# Patient Record
Sex: Female | Born: 1970 | Race: Black or African American | Hispanic: No | Smoking: Never smoker
Health system: Southern US, Community
[De-identification: ages and names within clinical notes are randomized; demographics above are authoritative.]

## PROBLEM LIST (undated history)

## (undated) ENCOUNTER — Inpatient Hospital Stay (HOSPITAL_COMMUNITY): Payer: Self-pay

## (undated) DIAGNOSIS — IMO0001 Reserved for inherently not codable concepts without codable children: Secondary | ICD-10-CM

## (undated) DIAGNOSIS — D219 Benign neoplasm of connective and other soft tissue, unspecified: Secondary | ICD-10-CM

## (undated) DIAGNOSIS — J189 Pneumonia, unspecified organism: Secondary | ICD-10-CM

## (undated) DIAGNOSIS — F32A Depression, unspecified: Secondary | ICD-10-CM

## (undated) DIAGNOSIS — K222 Esophageal obstruction: Secondary | ICD-10-CM

## (undated) DIAGNOSIS — K219 Gastro-esophageal reflux disease without esophagitis: Secondary | ICD-10-CM

## (undated) DIAGNOSIS — F329 Major depressive disorder, single episode, unspecified: Secondary | ICD-10-CM

## (undated) DIAGNOSIS — R030 Elevated blood-pressure reading, without diagnosis of hypertension: Secondary | ICD-10-CM

## (undated) DIAGNOSIS — I10 Essential (primary) hypertension: Principal | ICD-10-CM

## (undated) DIAGNOSIS — R002 Palpitations: Secondary | ICD-10-CM

## (undated) DIAGNOSIS — F419 Anxiety disorder, unspecified: Secondary | ICD-10-CM

## (undated) DIAGNOSIS — D573 Sickle-cell trait: Secondary | ICD-10-CM

## (undated) DIAGNOSIS — IMO0002 Reserved for concepts with insufficient information to code with codable children: Secondary | ICD-10-CM

## (undated) DIAGNOSIS — I499 Cardiac arrhythmia, unspecified: Secondary | ICD-10-CM

## (undated) DIAGNOSIS — R87619 Unspecified abnormal cytological findings in specimens from cervix uteri: Secondary | ICD-10-CM

## (undated) DIAGNOSIS — R87629 Unspecified abnormal cytological findings in specimens from vagina: Secondary | ICD-10-CM

## (undated) HISTORY — DX: Depression, unspecified: F32.A

## (undated) HISTORY — DX: Esophageal obstruction: K22.2

## (undated) HISTORY — DX: Elevated blood-pressure reading, without diagnosis of hypertension: R03.0

## (undated) HISTORY — DX: Anxiety disorder, unspecified: F41.9

## (undated) HISTORY — DX: Gastro-esophageal reflux disease without esophagitis: K21.9

## (undated) HISTORY — DX: Benign neoplasm of connective and other soft tissue, unspecified: D21.9

## (undated) HISTORY — DX: Reserved for inherently not codable concepts without codable children: IMO0001

## (undated) HISTORY — DX: Palpitations: R00.2

## (undated) HISTORY — DX: Essential (primary) hypertension: I10

## (undated) HISTORY — DX: Major depressive disorder, single episode, unspecified: F32.9

---

## 1998-07-29 ENCOUNTER — Inpatient Hospital Stay (HOSPITAL_COMMUNITY): Admission: AD | Admit: 1998-07-29 | Discharge: 1998-07-29 | Payer: Self-pay | Admitting: *Deleted

## 1998-07-29 ENCOUNTER — Emergency Department (HOSPITAL_COMMUNITY): Admission: EM | Admit: 1998-07-29 | Discharge: 1998-07-29 | Payer: Self-pay | Admitting: Emergency Medicine

## 1999-01-04 ENCOUNTER — Encounter: Payer: Self-pay | Admitting: Emergency Medicine

## 1999-01-04 ENCOUNTER — Emergency Department (HOSPITAL_COMMUNITY): Admission: EM | Admit: 1999-01-04 | Discharge: 1999-01-04 | Payer: Self-pay | Admitting: Emergency Medicine

## 1999-02-22 ENCOUNTER — Emergency Department (HOSPITAL_COMMUNITY): Admission: EM | Admit: 1999-02-22 | Discharge: 1999-02-22 | Payer: Self-pay | Admitting: Emergency Medicine

## 1999-04-24 ENCOUNTER — Emergency Department (HOSPITAL_COMMUNITY): Admission: EM | Admit: 1999-04-24 | Discharge: 1999-04-24 | Payer: Self-pay

## 1999-08-19 ENCOUNTER — Emergency Department (HOSPITAL_COMMUNITY): Admission: EM | Admit: 1999-08-19 | Discharge: 1999-08-19 | Payer: Self-pay | Admitting: Emergency Medicine

## 1999-11-29 HISTORY — PX: LAPAROSCOPY FOR ECTOPIC PREGNANCY: SUR765

## 2000-01-19 ENCOUNTER — Inpatient Hospital Stay (HOSPITAL_COMMUNITY): Admission: AD | Admit: 2000-01-19 | Discharge: 2000-01-19 | Payer: Self-pay | Admitting: Obstetrics

## 2000-01-21 ENCOUNTER — Inpatient Hospital Stay (HOSPITAL_COMMUNITY): Admission: AD | Admit: 2000-01-21 | Discharge: 2000-01-21 | Payer: Self-pay | Admitting: Obstetrics

## 2000-01-24 ENCOUNTER — Observation Stay (HOSPITAL_COMMUNITY): Admission: AD | Admit: 2000-01-24 | Discharge: 2000-01-25 | Payer: Self-pay | Admitting: *Deleted

## 2000-01-24 ENCOUNTER — Encounter (INDEPENDENT_AMBULATORY_CARE_PROVIDER_SITE_OTHER): Payer: Self-pay | Admitting: Specialist

## 2000-01-24 ENCOUNTER — Encounter: Payer: Self-pay | Admitting: *Deleted

## 2000-01-28 ENCOUNTER — Inpatient Hospital Stay (HOSPITAL_COMMUNITY): Admission: AD | Admit: 2000-01-28 | Discharge: 2000-01-28 | Payer: Self-pay | Admitting: Obstetrics & Gynecology

## 2000-01-28 ENCOUNTER — Encounter: Payer: Self-pay | Admitting: Obstetrics & Gynecology

## 2000-06-15 ENCOUNTER — Encounter (INDEPENDENT_AMBULATORY_CARE_PROVIDER_SITE_OTHER): Payer: Self-pay

## 2000-06-15 ENCOUNTER — Inpatient Hospital Stay (HOSPITAL_COMMUNITY): Admission: AD | Admit: 2000-06-15 | Discharge: 2000-06-18 | Payer: Self-pay | Admitting: *Deleted

## 2000-06-16 ENCOUNTER — Encounter: Payer: Self-pay | Admitting: *Deleted

## 2000-06-20 ENCOUNTER — Inpatient Hospital Stay (HOSPITAL_COMMUNITY): Admission: AD | Admit: 2000-06-20 | Discharge: 2000-06-20 | Payer: Self-pay | Admitting: Obstetrics and Gynecology

## 2000-06-22 ENCOUNTER — Inpatient Hospital Stay (HOSPITAL_COMMUNITY): Admission: AD | Admit: 2000-06-22 | Discharge: 2000-06-22 | Payer: Self-pay | Admitting: *Deleted

## 2000-09-14 ENCOUNTER — Emergency Department (HOSPITAL_COMMUNITY): Admission: EM | Admit: 2000-09-14 | Discharge: 2000-09-14 | Payer: Self-pay | Admitting: Emergency Medicine

## 2000-11-22 ENCOUNTER — Emergency Department (HOSPITAL_COMMUNITY): Admission: EM | Admit: 2000-11-22 | Discharge: 2000-11-22 | Payer: Self-pay | Admitting: Emergency Medicine

## 2000-12-20 ENCOUNTER — Inpatient Hospital Stay (HOSPITAL_COMMUNITY): Admission: AD | Admit: 2000-12-20 | Discharge: 2000-12-20 | Payer: Self-pay | Admitting: Obstetrics

## 2001-01-15 ENCOUNTER — Emergency Department (HOSPITAL_COMMUNITY): Admission: EM | Admit: 2001-01-15 | Discharge: 2001-01-15 | Payer: Self-pay | Admitting: Emergency Medicine

## 2001-01-21 ENCOUNTER — Emergency Department (HOSPITAL_COMMUNITY): Admission: EM | Admit: 2001-01-21 | Discharge: 2001-01-21 | Payer: Self-pay | Admitting: *Deleted

## 2001-01-21 ENCOUNTER — Encounter: Payer: Self-pay | Admitting: *Deleted

## 2001-01-29 ENCOUNTER — Inpatient Hospital Stay (HOSPITAL_COMMUNITY): Admission: AD | Admit: 2001-01-29 | Discharge: 2001-01-29 | Payer: Self-pay | Admitting: Obstetrics & Gynecology

## 2001-07-02 ENCOUNTER — Emergency Department (HOSPITAL_COMMUNITY): Admission: EM | Admit: 2001-07-02 | Discharge: 2001-07-02 | Payer: Self-pay | Admitting: Internal Medicine

## 2001-07-05 ENCOUNTER — Emergency Department (HOSPITAL_COMMUNITY): Admission: EM | Admit: 2001-07-05 | Discharge: 2001-07-05 | Payer: Self-pay | Admitting: Emergency Medicine

## 2001-07-09 ENCOUNTER — Other Ambulatory Visit (HOSPITAL_COMMUNITY): Admission: RE | Admit: 2001-07-09 | Discharge: 2001-07-17 | Payer: Self-pay | Admitting: Psychiatry

## 2001-08-22 ENCOUNTER — Emergency Department (HOSPITAL_COMMUNITY): Admission: EM | Admit: 2001-08-22 | Discharge: 2001-08-22 | Payer: Self-pay | Admitting: Emergency Medicine

## 2001-09-05 ENCOUNTER — Inpatient Hospital Stay (HOSPITAL_COMMUNITY): Admission: AD | Admit: 2001-09-05 | Discharge: 2001-09-08 | Payer: Self-pay | Admitting: *Deleted

## 2001-09-10 ENCOUNTER — Other Ambulatory Visit (HOSPITAL_COMMUNITY): Admission: RE | Admit: 2001-09-10 | Discharge: 2001-09-24 | Payer: Self-pay | Admitting: Psychiatry

## 2002-02-25 ENCOUNTER — Inpatient Hospital Stay (HOSPITAL_COMMUNITY): Admission: AD | Admit: 2002-02-25 | Discharge: 2002-02-25 | Payer: Self-pay | Admitting: *Deleted

## 2002-03-01 ENCOUNTER — Emergency Department (HOSPITAL_COMMUNITY): Admission: EM | Admit: 2002-03-01 | Discharge: 2002-03-01 | Payer: Self-pay | Admitting: Emergency Medicine

## 2002-05-26 ENCOUNTER — Emergency Department (HOSPITAL_COMMUNITY): Admission: EM | Admit: 2002-05-26 | Discharge: 2002-05-26 | Payer: Self-pay | Admitting: Emergency Medicine

## 2002-11-23 ENCOUNTER — Inpatient Hospital Stay (HOSPITAL_COMMUNITY): Admission: AD | Admit: 2002-11-23 | Discharge: 2002-11-23 | Payer: Self-pay | Admitting: Obstetrics and Gynecology

## 2003-01-24 ENCOUNTER — Inpatient Hospital Stay (HOSPITAL_COMMUNITY): Admission: AD | Admit: 2003-01-24 | Discharge: 2003-01-24 | Payer: Self-pay | Admitting: *Deleted

## 2003-01-24 ENCOUNTER — Encounter: Payer: Self-pay | Admitting: *Deleted

## 2003-01-28 ENCOUNTER — Other Ambulatory Visit: Admission: RE | Admit: 2003-01-28 | Discharge: 2003-01-28 | Payer: Self-pay | Admitting: *Deleted

## 2003-02-11 ENCOUNTER — Observation Stay (HOSPITAL_COMMUNITY): Admission: AD | Admit: 2003-02-11 | Discharge: 2003-02-11 | Payer: Self-pay | Admitting: *Deleted

## 2003-02-11 ENCOUNTER — Encounter: Payer: Self-pay | Admitting: *Deleted

## 2003-04-11 ENCOUNTER — Inpatient Hospital Stay (HOSPITAL_COMMUNITY): Admission: AD | Admit: 2003-04-11 | Discharge: 2003-04-11 | Payer: Self-pay | Admitting: *Deleted

## 2003-04-11 ENCOUNTER — Encounter: Payer: Self-pay | Admitting: *Deleted

## 2003-06-15 ENCOUNTER — Inpatient Hospital Stay (HOSPITAL_COMMUNITY): Admission: AD | Admit: 2003-06-15 | Discharge: 2003-06-15 | Payer: Self-pay | Admitting: *Deleted

## 2003-07-25 ENCOUNTER — Inpatient Hospital Stay (HOSPITAL_COMMUNITY): Admission: AD | Admit: 2003-07-25 | Discharge: 2003-07-25 | Payer: Self-pay | Admitting: *Deleted

## 2003-09-03 ENCOUNTER — Inpatient Hospital Stay (HOSPITAL_COMMUNITY): Admission: AD | Admit: 2003-09-03 | Discharge: 2003-09-03 | Payer: Self-pay | Admitting: Obstetrics and Gynecology

## 2003-09-03 ENCOUNTER — Encounter: Payer: Self-pay | Admitting: Obstetrics and Gynecology

## 2003-09-07 ENCOUNTER — Inpatient Hospital Stay (HOSPITAL_COMMUNITY): Admission: AD | Admit: 2003-09-07 | Discharge: 2003-09-07 | Payer: Self-pay | Admitting: Obstetrics and Gynecology

## 2003-09-08 ENCOUNTER — Inpatient Hospital Stay (HOSPITAL_COMMUNITY): Admission: AD | Admit: 2003-09-08 | Discharge: 2003-09-08 | Payer: Self-pay | Admitting: Obstetrics and Gynecology

## 2003-09-08 ENCOUNTER — Encounter: Payer: Self-pay | Admitting: Obstetrics and Gynecology

## 2003-09-15 ENCOUNTER — Encounter (INDEPENDENT_AMBULATORY_CARE_PROVIDER_SITE_OTHER): Payer: Self-pay | Admitting: *Deleted

## 2003-09-15 ENCOUNTER — Inpatient Hospital Stay (HOSPITAL_COMMUNITY): Admission: AD | Admit: 2003-09-15 | Discharge: 2003-09-18 | Payer: Self-pay | Admitting: Obstetrics and Gynecology

## 2003-09-20 ENCOUNTER — Inpatient Hospital Stay (HOSPITAL_COMMUNITY): Admission: AD | Admit: 2003-09-20 | Discharge: 2003-09-20 | Payer: Self-pay | Admitting: Obstetrics and Gynecology

## 2003-10-16 ENCOUNTER — Other Ambulatory Visit: Admission: RE | Admit: 2003-10-16 | Discharge: 2003-10-16 | Payer: Self-pay | Admitting: Obstetrics and Gynecology

## 2004-05-13 ENCOUNTER — Inpatient Hospital Stay (HOSPITAL_COMMUNITY): Admission: AD | Admit: 2004-05-13 | Discharge: 2004-05-13 | Payer: Self-pay | Admitting: *Deleted

## 2004-06-14 ENCOUNTER — Inpatient Hospital Stay (HOSPITAL_COMMUNITY): Admission: AD | Admit: 2004-06-14 | Discharge: 2004-06-14 | Payer: Self-pay | Admitting: *Deleted

## 2004-10-16 ENCOUNTER — Ambulatory Visit (HOSPITAL_COMMUNITY): Admission: RE | Admit: 2004-10-16 | Discharge: 2004-10-16 | Payer: Self-pay | Admitting: Family Medicine

## 2006-10-16 ENCOUNTER — Emergency Department (HOSPITAL_COMMUNITY): Admission: EM | Admit: 2006-10-16 | Discharge: 2006-10-16 | Payer: Self-pay | Admitting: Emergency Medicine

## 2008-03-16 ENCOUNTER — Emergency Department (HOSPITAL_COMMUNITY): Admission: EM | Admit: 2008-03-16 | Discharge: 2008-03-16 | Payer: Self-pay | Admitting: Emergency Medicine

## 2008-06-17 ENCOUNTER — Emergency Department (HOSPITAL_COMMUNITY): Admission: EM | Admit: 2008-06-17 | Discharge: 2008-06-18 | Payer: Self-pay | Admitting: Emergency Medicine

## 2008-11-16 ENCOUNTER — Emergency Department (HOSPITAL_BASED_OUTPATIENT_CLINIC_OR_DEPARTMENT_OTHER): Admission: EM | Admit: 2008-11-16 | Discharge: 2008-11-16 | Payer: Self-pay | Admitting: Emergency Medicine

## 2008-11-27 ENCOUNTER — Emergency Department (HOSPITAL_BASED_OUTPATIENT_CLINIC_OR_DEPARTMENT_OTHER): Admission: EM | Admit: 2008-11-27 | Discharge: 2008-11-27 | Payer: Self-pay | Admitting: Emergency Medicine

## 2009-02-19 ENCOUNTER — Emergency Department (HOSPITAL_BASED_OUTPATIENT_CLINIC_OR_DEPARTMENT_OTHER): Admission: EM | Admit: 2009-02-19 | Discharge: 2009-02-20 | Payer: Self-pay | Admitting: Emergency Medicine

## 2009-05-18 ENCOUNTER — Emergency Department (HOSPITAL_BASED_OUTPATIENT_CLINIC_OR_DEPARTMENT_OTHER): Admission: EM | Admit: 2009-05-18 | Discharge: 2009-05-19 | Payer: Self-pay | Admitting: Emergency Medicine

## 2009-05-19 ENCOUNTER — Ambulatory Visit: Payer: Self-pay | Admitting: Diagnostic Radiology

## 2009-08-06 ENCOUNTER — Ambulatory Visit: Payer: Self-pay

## 2009-08-18 ENCOUNTER — Emergency Department (HOSPITAL_COMMUNITY): Admission: EM | Admit: 2009-08-18 | Discharge: 2009-08-18 | Payer: Self-pay | Admitting: Emergency Medicine

## 2009-08-28 ENCOUNTER — Encounter: Payer: Self-pay | Admitting: Emergency Medicine

## 2009-08-28 ENCOUNTER — Inpatient Hospital Stay (HOSPITAL_COMMUNITY): Admission: AD | Admit: 2009-08-28 | Discharge: 2009-08-29 | Payer: Self-pay | Admitting: Internal Medicine

## 2009-08-28 ENCOUNTER — Ambulatory Visit: Payer: Self-pay | Admitting: Diagnostic Radiology

## 2009-09-02 ENCOUNTER — Emergency Department (HOSPITAL_BASED_OUTPATIENT_CLINIC_OR_DEPARTMENT_OTHER): Admission: EM | Admit: 2009-09-02 | Discharge: 2009-09-02 | Payer: Self-pay | Admitting: Emergency Medicine

## 2009-09-04 ENCOUNTER — Observation Stay (HOSPITAL_COMMUNITY): Admission: EM | Admit: 2009-09-04 | Discharge: 2009-09-05 | Payer: Self-pay | Admitting: Emergency Medicine

## 2009-09-14 ENCOUNTER — Emergency Department (HOSPITAL_COMMUNITY): Admission: EM | Admit: 2009-09-14 | Discharge: 2009-09-14 | Payer: Self-pay | Admitting: Emergency Medicine

## 2009-09-15 ENCOUNTER — Encounter (INDEPENDENT_AMBULATORY_CARE_PROVIDER_SITE_OTHER): Payer: Self-pay | Admitting: *Deleted

## 2009-09-18 ENCOUNTER — Emergency Department (HOSPITAL_BASED_OUTPATIENT_CLINIC_OR_DEPARTMENT_OTHER): Admission: EM | Admit: 2009-09-18 | Discharge: 2009-09-18 | Payer: Self-pay | Admitting: Emergency Medicine

## 2009-09-18 ENCOUNTER — Ambulatory Visit: Payer: Self-pay | Admitting: Diagnostic Radiology

## 2009-09-19 ENCOUNTER — Emergency Department (HOSPITAL_COMMUNITY): Admission: EM | Admit: 2009-09-19 | Discharge: 2009-09-19 | Payer: Self-pay | Admitting: Family Medicine

## 2009-09-24 ENCOUNTER — Emergency Department (HOSPITAL_COMMUNITY): Admission: EM | Admit: 2009-09-24 | Discharge: 2009-09-24 | Payer: Self-pay | Admitting: Emergency Medicine

## 2009-09-26 ENCOUNTER — Ambulatory Visit: Payer: Self-pay | Admitting: Diagnostic Radiology

## 2009-09-26 ENCOUNTER — Emergency Department (HOSPITAL_BASED_OUTPATIENT_CLINIC_OR_DEPARTMENT_OTHER): Admission: EM | Admit: 2009-09-26 | Discharge: 2009-09-26 | Payer: Self-pay | Admitting: Emergency Medicine

## 2009-09-28 ENCOUNTER — Ambulatory Visit: Payer: Self-pay | Admitting: Family

## 2009-09-28 ENCOUNTER — Telehealth: Payer: Self-pay | Admitting: Family

## 2009-09-28 DIAGNOSIS — F418 Other specified anxiety disorders: Secondary | ICD-10-CM | POA: Insufficient documentation

## 2009-09-28 DIAGNOSIS — I10 Essential (primary) hypertension: Secondary | ICD-10-CM

## 2009-09-28 DIAGNOSIS — K219 Gastro-esophageal reflux disease without esophagitis: Secondary | ICD-10-CM | POA: Insufficient documentation

## 2009-09-28 DIAGNOSIS — F411 Generalized anxiety disorder: Secondary | ICD-10-CM | POA: Insufficient documentation

## 2009-09-28 HISTORY — DX: Essential (primary) hypertension: I10

## 2009-09-28 LAB — CONVERTED CEMR LAB
Basophils Absolute: 0 10*3/uL (ref 0.0–0.1)
Chloride: 106 meq/L (ref 96–112)
HDL: 53 mg/dL (ref >39)
LDL Cholesterol: 113 mg/dL — ABNORMAL HIGH (ref 0–99)
MCHC: 34.8 g/dL (ref 30.0–36.0)
MCV: 82.5 fL (ref 78.0–100.0)
Neutrophils Relative %: 65 % (ref 43–77)
Platelets: 345 10*3/uL (ref 150–400)
Potassium: 3.9 meq/L (ref 3.5–5.3)
RDW: 12.6 % (ref 11.5–15.5)
TSH: 0.664 microintl units/mL (ref 0.350–4.500)
Total CHOL/HDL Ratio: 3.4
Triglycerides: 62 mg/dL (ref <150)

## 2009-09-29 ENCOUNTER — Encounter: Payer: Self-pay | Admitting: Family

## 2009-09-29 ENCOUNTER — Telehealth: Payer: Self-pay | Admitting: Family

## 2009-09-30 ENCOUNTER — Ambulatory Visit: Payer: Self-pay | Admitting: Family

## 2009-09-30 DIAGNOSIS — Z9189 Other specified personal risk factors, not elsewhere classified: Secondary | ICD-10-CM | POA: Insufficient documentation

## 2009-09-30 DIAGNOSIS — E559 Vitamin D deficiency, unspecified: Secondary | ICD-10-CM | POA: Insufficient documentation

## 2009-09-30 DIAGNOSIS — R002 Palpitations: Secondary | ICD-10-CM | POA: Insufficient documentation

## 2009-10-02 ENCOUNTER — Telehealth: Payer: Self-pay | Admitting: Family

## 2009-10-02 ENCOUNTER — Ambulatory Visit: Payer: Self-pay | Admitting: *Deleted

## 2009-10-04 ENCOUNTER — Ambulatory Visit (HOSPITAL_COMMUNITY): Admission: RE | Admit: 2009-10-04 | Discharge: 2009-10-04 | Payer: Self-pay | Admitting: Psychiatry

## 2009-10-04 ENCOUNTER — Emergency Department (HOSPITAL_COMMUNITY): Admission: EM | Admit: 2009-10-04 | Discharge: 2009-10-04 | Payer: Self-pay | Admitting: Emergency Medicine

## 2009-10-09 ENCOUNTER — Ambulatory Visit: Payer: Self-pay | Admitting: Diagnostic Radiology

## 2009-10-09 ENCOUNTER — Emergency Department (HOSPITAL_BASED_OUTPATIENT_CLINIC_OR_DEPARTMENT_OTHER): Admission: EM | Admit: 2009-10-09 | Discharge: 2009-10-09 | Payer: Self-pay | Admitting: Emergency Medicine

## 2009-10-10 ENCOUNTER — Emergency Department (HOSPITAL_BASED_OUTPATIENT_CLINIC_OR_DEPARTMENT_OTHER): Admission: EM | Admit: 2009-10-10 | Discharge: 2009-10-10 | Payer: Self-pay | Admitting: Emergency Medicine

## 2009-10-14 ENCOUNTER — Other Ambulatory Visit (HOSPITAL_COMMUNITY): Admission: RE | Admit: 2009-10-14 | Discharge: 2009-10-21 | Payer: Self-pay | Admitting: Psychiatry

## 2009-10-15 ENCOUNTER — Ambulatory Visit: Payer: Self-pay | Admitting: Psychiatry

## 2009-10-18 ENCOUNTER — Encounter: Payer: Self-pay | Admitting: Cardiology

## 2009-11-23 ENCOUNTER — Telehealth: Payer: Self-pay | Admitting: Internal Medicine

## 2010-01-07 ENCOUNTER — Emergency Department (HOSPITAL_BASED_OUTPATIENT_CLINIC_OR_DEPARTMENT_OTHER): Admission: EM | Admit: 2010-01-07 | Discharge: 2010-01-08 | Payer: Self-pay | Admitting: Emergency Medicine

## 2010-01-21 ENCOUNTER — Ambulatory Visit: Payer: Self-pay | Admitting: Cardiology

## 2010-02-02 ENCOUNTER — Emergency Department (HOSPITAL_BASED_OUTPATIENT_CLINIC_OR_DEPARTMENT_OTHER): Admission: EM | Admit: 2010-02-02 | Discharge: 2010-02-02 | Payer: Self-pay | Admitting: Emergency Medicine

## 2010-02-02 ENCOUNTER — Ambulatory Visit: Payer: Self-pay | Admitting: Diagnostic Radiology

## 2010-02-15 ENCOUNTER — Encounter: Payer: Self-pay | Admitting: Cardiology

## 2010-02-17 ENCOUNTER — Encounter (INDEPENDENT_AMBULATORY_CARE_PROVIDER_SITE_OTHER): Payer: Self-pay | Admitting: *Deleted

## 2010-03-25 ENCOUNTER — Emergency Department (HOSPITAL_BASED_OUTPATIENT_CLINIC_OR_DEPARTMENT_OTHER): Admission: EM | Admit: 2010-03-25 | Discharge: 2010-03-25 | Payer: Self-pay | Admitting: Emergency Medicine

## 2010-05-16 ENCOUNTER — Emergency Department (HOSPITAL_BASED_OUTPATIENT_CLINIC_OR_DEPARTMENT_OTHER): Admission: EM | Admit: 2010-05-16 | Discharge: 2010-05-16 | Payer: Self-pay | Admitting: Emergency Medicine

## 2010-05-20 ENCOUNTER — Telehealth: Payer: Self-pay | Admitting: Family

## 2010-05-24 ENCOUNTER — Ambulatory Visit: Payer: Self-pay | Admitting: Family

## 2010-05-24 DIAGNOSIS — R5383 Other fatigue: Secondary | ICD-10-CM | POA: Insufficient documentation

## 2010-05-24 LAB — CONVERTED CEMR LAB
CO2: 25 meq/L (ref 19–32)
Glucose, Bld: 92 mg/dL (ref 70–99)
HCT: 34.2 % — ABNORMAL LOW (ref 36.0–46.0)
Hemoglobin: 12.2 g/dL (ref 12.0–15.0)
MCV: 79.9 fL (ref 78.0–100.0)
Platelets: 342 10*3/uL (ref 150–400)
RDW: 12.7 % (ref 11.5–15.5)
Sodium: 140 meq/L (ref 135–145)
TSH: 0.773 microintl units/mL (ref 0.350–4.500)
Vit D, 1,25-Dihydroxy: 28 — ABNORMAL LOW (ref 30–89)

## 2010-05-25 ENCOUNTER — Encounter: Payer: Self-pay | Admitting: Family

## 2010-05-28 ENCOUNTER — Telehealth: Payer: Self-pay | Admitting: Family

## 2010-06-15 ENCOUNTER — Telehealth: Payer: Self-pay | Admitting: Family

## 2010-06-22 ENCOUNTER — Telehealth: Payer: Self-pay | Admitting: Family

## 2010-06-29 ENCOUNTER — Ambulatory Visit: Payer: Self-pay | Admitting: Family

## 2010-07-30 ENCOUNTER — Ambulatory Visit: Payer: Self-pay | Admitting: Family

## 2010-08-15 ENCOUNTER — Emergency Department (HOSPITAL_BASED_OUTPATIENT_CLINIC_OR_DEPARTMENT_OTHER): Admission: EM | Admit: 2010-08-15 | Discharge: 2010-08-15 | Payer: Self-pay | Admitting: Emergency Medicine

## 2010-09-03 ENCOUNTER — Emergency Department (HOSPITAL_BASED_OUTPATIENT_CLINIC_OR_DEPARTMENT_OTHER): Admission: EM | Admit: 2010-09-03 | Discharge: 2010-09-03 | Payer: Self-pay | Admitting: Emergency Medicine

## 2010-10-19 ENCOUNTER — Emergency Department (HOSPITAL_BASED_OUTPATIENT_CLINIC_OR_DEPARTMENT_OTHER): Admission: EM | Admit: 2010-10-19 | Discharge: 2010-10-19 | Payer: Self-pay | Admitting: Emergency Medicine

## 2010-11-28 HISTORY — PX: WISDOM TOOTH EXTRACTION: SHX21

## 2010-12-30 NOTE — Progress Notes (Signed)
Summary: status update  Phone Note Other Incoming Call back at [phone]   Caller: Revonda Humphrey (call-a-nurse) Summary of Call: Patient calling about overdose of Rx. Took evening dose of Lexapro 20mg  before leaving house about 4:30pm. Was at restaurant and thought she needed to take Rx so took second dose of 20 mg about 5:15pm. Now feeling slightly lightheaded for the last 45 min starting about 5:40pm. Slight nausea, worry. Gave care information. Transferred to Poison control/[place].  Follow-up for Phone Call        Attempted to contact pt. to check on status and see if she had questions in regards to her medication directions. Left message on voicemail to return my call.  Nicki Guadalajara Fergerson CMA Duncan Dull)  May 28, 2010 8:17 AM   Additional Follow-up for Phone Call Additional follow up Details #1::        Pt returned my call. Stated she felt better today. Poison control center told her she should not feel much effect as the medication has been in her system for 3 months already. Pt states she slept it off and feels fine. She denies questions re: medication directions and states it was just an accident.  Nicki Guadalajara Fergerson CMA [place] Dull)  May 28, 2010 11:34 AM

## 2010-12-30 NOTE — Progress Notes (Signed)
Summary: lexapro alternative  Phone Note Call from Patient Call back at [phone]   Caller: Patient Call For: Lemont Fillers FNP Summary of Call: Pt called stating that she went to pharmacy to get Lexapro rx and she could not afford them. States she has no rx benefits through her insurance.  Advised pt I would check with drug company for pt assistance and call her back tomorrow.  Pt states it is ok to leave detailed message on her home phone.  Nicki Guadalajara Fergerson CMA Duncan Dull)  June 22, 2010 4:57 PM   Follow-up for Phone Call        Left message on machine to return my call at 9:30am today. Nicki Guadalajara Fergerson CMA [facility] Dull)  Additional Follow-up for Phone Call Additional follow up Details #1::        Pt returned my call and left message to call her at [phone] until 4pm today. Called number and received fax tone. Left message on pt's home number to call me tomorrow.  Nicki Guadalajara Fergerson CMA Duncan Dull)  June 23, 2010 3:01 PM       Additional Follow-up for Phone Call Additional follow up Details #2::    Asmar Brozek, I have put paperwork for pt assistance to be completed in your folder. Pt has not returned my call so I am not sure if she is currently without med. We are out of samples. I left a detailed message on her voicemail letting her know that we are working on the form and did not have samples.   Nicki Guadalajara Fergerson CMA Duncan Dull)  June 28, 2010 9:26 AM   Additional Follow-up for Phone Call Additional follow up Details #3:: Details for Additional Follow-up Action Taken: I would recommend that the patient try a similar generic medication that would be cheaper for her first.  She should stop lexapro and start Citalopram.  She should call if she develops worsening depression.  Follow up in 1 month. Lemont Fillers FNP,  June 28, 2010 10:17 AM  Left message on machine to return my call.  Nicki Guadalajara Fergerson CMA Duncan Dull)  June 28, 2010 11:46 AM  Left message on machine to return my call.  Nicki Guadalajara Fergerson  CMA [place] Dull)  June 29, 2010 4:32 PM   Advised pt per [facility] instructions above. Pt voices understanding. Follow up scheduled for 07/30/10 @ 9:30 with Efraim Kaufmann.  Nicki Guadalajara Fergerson CMA (AAMA)  July 02, 2010 11:18 AM   New/Updated Medications: CITALOPRAM HYDROBROMIDE 20 MG TABS (CITALOPRAM HYDROBROMIDE) one tablet by mouth daily Prescriptions: CITALOPRAM HYDROBROMIDE 20 MG TABS (CITALOPRAM HYDROBROMIDE) one tablet by mouth daily  #30 x 1   Entered and Authorized by:   Lemont Fillers FNP   Signed by:   Lemont Fillers FNP on 06/28/2010   Method used:   Electronically to        [facility] [phone][facility] (retail)       [address]       [place], [place]  [address]       Ph: [phone]       Fax: [phone]   RxID:   [id number]

## 2010-12-30 NOTE — Assessment & Plan Note (Signed)
Summary: EC6/PALPS/RESCH/OK PER HEATHER   Visit Type:  Initial Consult Primary Provider:  Sandford Craze, FNP  CC:  chest pain.  History of Present Illness: The patient is seen for consultation evaluate chest pain and palpitations. For several months she has been very worried about chest discomfort.  Today she tells me that frequently she feels brief palpitations.  Following that she has chest discomfort.  This can occur at rest or with stress.  This has substantially affected her life and she has cut out all types of exercise.  She has 2 young children and she is worried about the situation.  The chest discomfort is nonspecific.  She does not have nausea vomiting or diaphoresis.  There is no radiation.  She has not had or presyncope.  Current Medications (verified): 1)  Dexilant 60 Mg Cpdr (Dexlansoprazole) .... One Tablet By Mouth Daily As Needed 2)  Lexapro 20 Mg Tabs (Escitalopram Oxalate) .... Once Daily 3)  Klonopin 0.5 Mg Tabs (Clonazepam) .... As Needed  Allergies (verified): 1)  ! Benadryl (Diphenhydramine Hcl) 2)  ! Morphine 3)  ! Dilaudid (Hydromorphone Hcl)  Past History:  Past Medical History: Esophageal stricture Depression GERD Anxiety Disorder Uterine fibroids Chest Pain....Marland Kitchenno pulmonary embolus by ches CT..09/14/2009 Palpitations Elevated BP C-Section  Family History: Reviewed history from 09/30/2009 and no changes required. Mom alive age 42 A&W Dad alive age 8 A&W  Social History: Reviewed history from 09/30/2009 and no changes required. 2 children ages 37 and 85, self- employed Education officer, museum. Never smoked, no drugs, Denies alcohol  Review of Systems       Patient denies fever, chills, headache, sweats, rash, change in vision, change in hearing, cough, nausea or vomiting, urinary symptoms.  All other systems are reviewed and are negative.  Vital Signs:  Patient profile:   40 year old female Height:      65 inches Weight:      204  pounds BMI:     34.07 Pulse rate:   70 / minute BP sitting:   104 / 70  (left arm) Cuff size:   regular  Vitals Entered By: Hardin Negus, RMA (January 21, 2010 10:59 AM)  Physical Exam  General:  The patient is quite stable.  She is very concerned about. Head:  head is atraumatic. Eyes:  no xanthelasma. Neck:  no jugular venous distention. Chest Wall:  no chest wall tenderness. Lungs:  lungs are clear.  Respiratory effort is nonlabored. Heart:  cardiac exam reveals S1-S2.  There are no clicks or significant murmurs. Abdomen:  abdomen is soft. Msk:  there are no musculoskeletal deformities. Extremities:  there is no peripheral edema. Skin:  there is no skin rashes. Psych:  patient is oriented to person time and place.  Affect is normal.   Impression & Recommendations:  Problem # 1:  CHEST PAIN, ATYPICAL, HX OF (ICD-V15.89)  At this point there is no proven evidence of ischemic heart disease.  She does not have a strong family history and she does not smoke.  Her LDL is 112 HDL 53.  We do need to assess her further with exercise testing.  Two-dimensional echo will also be done to fully assess all of her valves.  Along with this she will have a stress echo.  I have reviewed the EKG done previously by her primary care team.  She has incomplete right bundle branch block.  Orders: Stress Echo (Stress Echo) Echocardiogram (Echo) Event (Event)  Problem # 2:  PALPITATIONS, RECURRENT (ICD-785.1)  Her palpitations sound more like PACs and PVCs as opposed to prolong runs of an arrhythmia.  However it is the palpitations that she frequently feels first.  We will have her wear an event recorder to fully assess her rhythm.  Orders: Stress Echo (Stress Echo) Echocardiogram (Echo) Event (Event)  Problem # 3:  ELEVATED BLOOD PRESSURE WITHOUT DIAGNOSIS OF HYPERTENSION (ICD-796.2) blood pressure is well controlled today.  No change in therapy.  Patient Instructions: 1)  Your  physician has requested that you have a stress echocardiogram. For further information please visit [email or web address]  Please follow instruction sheet as given. 2)  Your physician has requested that you have an echocardiogram.  Echocardiography is a painless test that uses sound waves to create images of your heart. It provides your doctor with information about the size and shape of your heart and how well your heart's chambers and valves are working.  This procedure takes approximately one hour. There are no restrictions for this procedure. 3)  Your physician has recommended that you wear an event monitor.  Event monitors are medical devices that record the heart's electrical activity. Doctors most often use these monitors to diagnose arrhythmias. Arrhythmias are problems with the speed or rhythm of the heartbeat. The monitor is a small, portable device. You can wear one while you do your normal daily activities. This is usually used to diagnose what is causing palpitations/syncope (passing out). 4)  Follow up in 2 weeks

## 2010-12-30 NOTE — Progress Notes (Signed)
Summary: needs some samples till appt   Phone Note Call from Patient Call back at Home Phone [phone]   Caller: Patient Call For: Stacy [name]  Summary of Call: she has an appt on Monday to see Dorlene Footman.  She needs some samples of Lexapro 10mg  before the visit if possible.   Initial call taken by: Roselle Locus,  May 20, 2010 10:16 AM  Follow-up for Phone Call        Spoke to pt. and confirmed that pt is taking Lexapro 10mg  2 tablets daily.  Advised pt that I would leave 2 boxes at the front desk for her to pick up.  Mervin Kung CMA  May 20, 2010 4:39 PM

## 2010-12-30 NOTE — Assessment & Plan Note (Signed)
Summary: 1 month follow up/mhf--Rm 4   Vital Signs:  Patient profile:   40 year old female Height:      65 inches Temp:     98.0 degrees F oral Pulse rate:   102 / minute Pulse rhythm:   regular Resp:     16 per minute BP sitting:   120 / 72  (right arm) Cuff size:   large  Vitals Entered By: Stacy [name] CMA (May 24, 2010 3:41 PM) CC: Room 4  1 month follow up.  No energy, can hardly get through the day x 1 month. Is Patient Diabetic? No Comments Pt agrees all med doses and directions are correct.  Stacy [name] CMA  May 24, 2010 3:45 PM    Primary Care Provider:  Sandford Craze, FNP  CC:  Room 4  1 month follow up.  No energy and can hardly get through the day x 1 month..  History of Present Illness: Stacy [name] is a 40 yr old female who presents today for follow up.   1) Depression/anxiety- much improved [facility] and was placed on Lexapro.  Sees a therapist twice a month.  She finds this very helpful. Notes + weight gain since starting lexapro, but she declined weight today.   2)  Palpitations- resolved. She reports that she did not complete with work up as recommended by Dr. Myrtis Ser.  Reports that her chest pain has resolved- only discomfort that she has is due to reflux.  3) GERD- improved with dexilant.  (Follows with Dr. Elnoria Howard)  4) Fatigue- notes poor energy. This is new per patient. Sleeps 7-8 hours a night. Denies heavy periods.  Allergies: 1)  ! Benadryl (Diphenhydramine Hcl) 2)  ! Morphine 3)  ! Dilaudid (Hydromorphone Hcl)  Past History:  Past Medical History: Last updated: 01/21/2010 Esophageal stricture Depression GERD Anxiety Disorder Uterine fibroids Chest Pain....Marland Kitchenno pulmonary embolus by ches CT..09/14/2009 Palpitations Elevated BP C-Section  Past Surgical History: Last updated: 09/28/2009 C-Section  Family History: Last updated: 09/30/2009 Mom alive age 79 A&W Dad alive age 68 A&W  Social History: Last updated:  09/30/2009 2 children ages 6 and 3, self- employed property Banker. Never smoked, no drugs, Denies alcohol  Risk Factors: Smoking Status: never (09/28/2009)  Physical Exam  General:  Overweight AA female in NAD Head:  Normocephalic and atraumatic without obvious abnormalities. No apparent alopecia or balding. Lungs:  Normal respiratory effort, chest expands symmetrically. Lungs are clear to auscultation, no crackles or wheezes. Heart:  Normal rate and regular rhythm. S1 and S2 normal without gallop, murmur, click, rub or other extra sounds. Psych:  Cognition and judgment appear intact. Alert and cooperative with normal attention span and concentration. No apparent delusions, illusions, hallucinations   Impression & Recommendations:  Problem # 1:  FATIGUE (ICD-780.79) Assessment New Will check labs as noted below.    Orders: T-CBC No Diff ([id number]) T-Basic Metabolic Panel ([id number]) T-TSH [phone])  Problem # 2:  VITAMIN D DEFICIENCY (ICD-268.9) Assessment: Comment Only She completed the 12 months of weekly supplementation- check vitamin d level Orders: T-Assay of Vitamin D ([id number])  Complete Medication List: 1)  Dexilant 60 Mg Cpdr (Dexlansoprazole) .... One tablet by mouth daily as needed 2)  Lexapro 20 Mg Tabs (Escitalopram oxalate) .... Once daily 3)  Klonopin 0.5 Mg Tabs (Clonazepam) .... As needed  Other Orders: T-Hgb A1C ([id number])  Patient Instructions: 1)  Please complete your blood work today prior to leaving. 2)  Follow up in 1 month.  Current Allergies (reviewed today): ! BENADRYL (DIPHENHYDRAMINE HCL) ! MORPHINE ! DILAUDID (HYDROMORPHONE HCL)

## 2010-12-30 NOTE — Letter (Signed)
Summary: Appointment - Missed  [facility], Main Office  [address]   [place], [place] [address]   Phone: [phone]  Fax: [phone]     February 17, 2010 MRN: [id number]   [facility] [address] [place], [place]  [address]   Dear [name],  Our records indicate you missed your appointment on 02/15/2010 with Dr. Myrtis Ser. It is very important that we reach you to reschedule this appointment. We look forward to participating in your health care needs. Please contact us at the number listed above at your earliest convenience to reschedule this appointment.     Sincerely,   Migdalia Dk [facility] Scheduling Team

## 2010-12-30 NOTE — Letter (Signed)
Summary: Generic Letter  [facility], Main Office  [address]   [place], [place] [address]   Phone: [phone]  Fax: [phone]        February 15, 2010 MRN: [id number]    [facility] [address] [place], [place]  [address]    Dear [name],  Our records indicate that you have missed your appointment for your stress echocardiogram and to receive your heart monitor.  It is Dr Myrtis Ser recommendation that you have this test done.  Please contact our office as soon as possible to reschedule.     Sincerely,  Meredith Staggers, RN Willa Rough, MD  This letter has been electronically signed by your physician.

## 2010-12-30 NOTE — Letter (Signed)
   [name] [facility] [address] [place], [place]  [address]  [place] Phone: [phone]      May 25, 2010   [name] [address] [place], [place] [address]  RE:  LAB RESULTS  Dear  Stacy [name],  The following is an interpretation of your most recent lab tests.  Please take note of any instructions provided or changes to medications that have resulted from your lab work.  ELECTROLYTES:  Good - no changes needed  KIDNEY FUNCTION TESTS:  Good - no changes needed   THYROID STUDIES:  Thyroid studies normal TSH: 0.773     DIABETIC STUDIES:  Good - no changes needed Blood Glucose: 92   HgbA1C: 5.3     CBC:  Good - no changes needed Vitamin D is slightly low, please add an over the counter supplement.  See below.    Please follow up in 1 month   Medications Prescribed or Changed VITAMIN D 1000 UNIT TABS (CHOLECALCIFEROL) 2 tabs by mouth daily   Sincerely Yours,    Lemont Fillers FNP

## 2010-12-30 NOTE — Progress Notes (Signed)
Summary: Lexapro samples  Phone Note Call from Patient   Caller: Patient Call For: Lemont Fillers FNP Summary of Call: Pt called requesting samples of Lexapro. Notified pt that 2 boxes of samples are at the front desk. She will need to take 2 daily as they are 10mg .  Reminded pt to schedule her 1 month f/u with Magan Winnett.  Nicki Guadalajara Fergerson CMA Duncan Dull)  June 15, 2010 11:31 AM

## 2011-02-16 LAB — URINE MICROSCOPIC-ADD ON

## 2011-02-16 LAB — URINE CULTURE
Colony Count: NO GROWTH
Culture: NO GROWTH

## 2011-02-16 LAB — WET PREP, GENITAL
Clue Cells Wet Prep HPF POC: NONE SEEN
Yeast Wet Prep HPF POC: NONE SEEN

## 2011-02-16 LAB — URINALYSIS, ROUTINE W REFLEX MICROSCOPIC
Glucose, UA: NEGATIVE mg/dL
Ketones, ur: NEGATIVE mg/dL
Nitrite: NEGATIVE
Protein, ur: NEGATIVE mg/dL
Specific Gravity, Urine: 1.031 — ABNORMAL HIGH (ref 1.005–1.030)
Urobilinogen, UA: 1 mg/dL (ref 0.0–1.0)
pH: 5.5 (ref 5.0–8.0)

## 2011-02-21 LAB — POCT B-TYPE NATRIURETIC PEPTIDE (BNP): B Natriuretic Peptide, POC: 34.3 pg/mL (ref 0–100)

## 2011-02-21 LAB — DIFFERENTIAL
Basophils Relative: 3 % — ABNORMAL HIGH (ref 0–1)
Lymphs Abs: 1.7 10*3/uL (ref 0.7–4.0)
Monocytes Relative: 8 % (ref 3–12)
Neutro Abs: 2.1 10*3/uL (ref 1.7–7.7)
Neutrophils Relative %: 47 % (ref 43–77)

## 2011-02-21 LAB — CBC
HCT: 34.3 % — ABNORMAL LOW (ref 36.0–46.0)
Hemoglobin: 12.2 g/dL (ref 12.0–15.0)
MCHC: 35.6 g/dL (ref 30.0–36.0)
MCV: 85.6 fL (ref 78.0–100.0)
RDW: 12.5 % (ref 11.5–15.5)

## 2011-02-21 LAB — URINALYSIS, ROUTINE W REFLEX MICROSCOPIC
Hgb urine dipstick: NEGATIVE
Specific Gravity, Urine: 1.019 (ref 1.005–1.030)
Urobilinogen, UA: 1 mg/dL (ref 0.0–1.0)

## 2011-02-21 LAB — COMPREHENSIVE METABOLIC PANEL
Alkaline Phosphatase: 56 U/L (ref 39–117)
BUN: 10 mg/dL (ref 6–23)
Calcium: 8.4 mg/dL (ref 8.4–10.5)
GFR calc non Af Amer: >60 mL/min (ref >60)
Glucose, Bld: 88 mg/dL (ref 70–99)
Total Protein: 7.4 g/dL (ref 6.0–8.3)

## 2011-02-21 LAB — POCT CARDIAC MARKERS: CKMB, poc: <1 ng/mL — ABNORMAL LOW (ref 1.0–8.0)

## 2011-02-21 LAB — D-DIMER, QUANTITATIVE: D-Dimer, Quant: 0.69 ug/mL-FEU — ABNORMAL HIGH (ref 0.00–0.48)

## 2011-03-02 LAB — CBC
HCT: 34.8 % — ABNORMAL LOW (ref 36.0–46.0)
Hemoglobin: 12 g/dL (ref 12.0–15.0)
MCHC: 34.4 g/dL (ref 30.0–36.0)
MCV: 86.7 fL (ref 78.0–100.0)
RBC: 4.02 MIL/uL (ref 3.87–5.11)
RDW: 12.1 % (ref 11.5–15.5)

## 2011-03-02 LAB — DIFFERENTIAL
Eosinophils Absolute: 0.1 10*3/uL (ref 0.0–0.7)
Lymphocytes Relative: 29 % (ref 12–46)
Lymphs Abs: 2.2 10*3/uL (ref 0.7–4.0)
Monocytes Relative: 9 % (ref 3–12)
Neutro Abs: 4.4 10*3/uL (ref 1.7–7.7)
Neutrophils Relative %: 59 % (ref 43–77)

## 2011-03-02 LAB — COMPREHENSIVE METABOLIC PANEL
BUN: 4 mg/dL — ABNORMAL LOW (ref 6–23)
CO2: 30 mEq/L (ref 19–32)
Calcium: 9.2 mg/dL (ref 8.4–10.5)
Creatinine, Ser: 0.7 mg/dL (ref 0.4–1.2)
GFR calc non Af Amer: >60 mL/min (ref >60)
Glucose, Bld: 87 mg/dL (ref 70–99)
Sodium: 142 mEq/L (ref 135–145)
Total Protein: 7.4 g/dL (ref 6.0–8.3)

## 2011-03-02 LAB — LIPASE, BLOOD: Lipase: 32 U/L (ref 23–300)

## 2011-03-03 LAB — CBC
HCT: 37.2 % (ref 36.0–46.0)
HCT: 38.3 % (ref 36.0–46.0)
HCT: 35.2 % — ABNORMAL LOW (ref 36.0–46.0)
HCT: 39.2 % (ref 36.0–46.0)
HCT: 35.5 % — ABNORMAL LOW (ref 36.0–46.0)
Hemoglobin: 13.3 g/dL (ref 12.0–15.0)
Hemoglobin: 12.3 g/dL (ref 12.0–15.0)
Hemoglobin: 12.2 g/dL (ref 12.0–15.0)
MCHC: 34.7 g/dL (ref 30.0–36.0)
MCHC: 35 g/dL (ref 30.0–36.0)
MCHC: 34.3 g/dL (ref 30.0–36.0)
MCHC: 34.5 g/dL (ref 30.0–36.0)
MCV: 86.9 fL (ref 78.0–100.0)
MCV: 87.1 fL (ref 78.0–100.0)
MCV: 86.6 fL (ref 78.0–100.0)
MCV: 86.9 fL (ref 78.0–100.0)
MCV: 87.2 fL (ref 78.0–100.0)
MCV: 85.6 fL (ref 78.0–100.0)
Platelets: 315 10*3/uL (ref 150–400)
Platelets: 314 10*3/uL (ref 150–400)
Platelets: 288 10*3/uL (ref 150–400)
Platelets: 338 10*3/uL (ref 150–400)
RBC: 4.28 MIL/uL (ref 3.87–5.11)
RBC: 4.1 MIL/uL (ref 3.87–5.11)
RBC: 4.08 MIL/uL (ref 3.87–5.11)
RDW: 12.7 % (ref 11.5–15.5)
RDW: 12.3 % (ref 11.5–15.5)
RDW: 12.7 % (ref 11.5–15.5)
RDW: 12.8 % (ref 11.5–15.5)
RDW: 12.1 % (ref 11.5–15.5)
WBC: 6.9 10*3/uL (ref 4.0–10.5)
WBC: 5.1 10*3/uL (ref 4.0–10.5)
WBC: 4.9 10*3/uL (ref 4.0–10.5)

## 2011-03-03 LAB — COMPREHENSIVE METABOLIC PANEL
AST: 17 U/L (ref 0–37)
AST: 13 U/L (ref 0–37)
Albumin: 3.5 g/dL (ref 3.5–5.2)
Albumin: 3.1 g/dL — ABNORMAL LOW (ref 3.5–5.2)
Alkaline Phosphatase: 53 U/L (ref 39–117)
BUN: 1 mg/dL — ABNORMAL LOW (ref 6–23)
BUN: 7 mg/dL (ref 6–23)
CO2: 27 mEq/L (ref 19–32)
CO2: 25 mEq/L (ref 19–32)
Calcium: 8.6 mg/dL (ref 8.4–10.5)
Calcium: 9.3 mg/dL (ref 8.4–10.5)
Chloride: 105 mEq/L (ref 96–112)
Chloride: 104 mEq/L (ref 96–112)
Creatinine, Ser: 0.6 mg/dL (ref 0.4–1.2)
Creatinine, Ser: 0.78 mg/dL (ref 0.4–1.2)
GFR calc Af Amer: >60 mL/min (ref >60)
GFR calc non Af Amer: >60 mL/min (ref >60)
GFR calc non Af Amer: >60 mL/min (ref >60)
Glucose, Bld: 99 mg/dL (ref 70–99)
Potassium: 3.3 mEq/L — ABNORMAL LOW (ref 3.5–5.1)
Total Bilirubin: 0.4 mg/dL (ref 0.3–1.2)
Total Bilirubin: 0.5 mg/dL (ref 0.3–1.2)
Total Protein: 6.6 g/dL (ref 6.0–8.3)
Total Protein: 7.2 g/dL (ref 6.0–8.3)

## 2011-03-03 LAB — POCT CARDIAC MARKERS
CKMB, poc: <1 ng/mL — ABNORMAL LOW (ref 1.0–8.0)
CKMB, poc: <1 ng/mL — ABNORMAL LOW (ref 1.0–8.0)
Myoglobin, poc: 48.8 ng/mL (ref 12–200)
Troponin i, poc: <0.05 ng/mL (ref 0.00–0.09)
Troponin i, poc: <0.05 ng/mL (ref 0.00–0.09)
Troponin i, poc: <0.05 ng/mL (ref 0.00–0.09)
Troponin i, poc: <0.05 ng/mL (ref 0.00–0.09)

## 2011-03-03 LAB — LIPASE, BLOOD
Lipase: 18 U/L (ref 11–59)
Lipase: 16 U/L (ref 11–59)
Lipase: 26 U/L (ref 23–300)

## 2011-03-03 LAB — BASIC METABOLIC PANEL
BUN: 1 mg/dL — ABNORMAL LOW (ref 6–23)
BUN: 4 mg/dL — ABNORMAL LOW (ref 6–23)
BUN: 6 mg/dL (ref 6–23)
CO2: 25 mEq/L (ref 19–32)
CO2: 26 mEq/L (ref 19–32)
Calcium: 9.1 mg/dL (ref 8.4–10.5)
Chloride: 105 mEq/L (ref 96–112)
Chloride: 104 mEq/L (ref 96–112)
Chloride: 105 mEq/L (ref 96–112)
Creatinine, Ser: 0.7 mg/dL (ref 0.4–1.2)
GFR calc Af Amer: >60 mL/min (ref >60)
GFR calc non Af Amer: >60 mL/min (ref >60)
Glucose, Bld: 94 mg/dL (ref 70–99)
Glucose, Bld: 89 mg/dL (ref 70–99)
Glucose, Bld: 92 mg/dL (ref 70–99)
Potassium: 3.6 mEq/L (ref 3.5–5.1)
Potassium: 3.3 mEq/L — ABNORMAL LOW (ref 3.5–5.1)
Potassium: 3.5 mEq/L (ref 3.5–5.1)
Sodium: 138 mEq/L (ref 135–145)
Sodium: 143 mEq/L (ref 135–145)

## 2011-03-03 LAB — URINE CULTURE: Colony Count: 50000

## 2011-03-03 LAB — URINE MICROSCOPIC-ADD ON

## 2011-03-03 LAB — LIPID PANEL
HDL: 56 mg/dL (ref >39)
LDL Cholesterol: 91 mg/dL (ref 0–99)
Total CHOL/HDL Ratio: 2.8 RATIO
Triglycerides: 49 mg/dL (ref <150)
VLDL: 10 mg/dL (ref 0–40)

## 2011-03-03 LAB — URINALYSIS, ROUTINE W REFLEX MICROSCOPIC
Bilirubin Urine: NEGATIVE
Bilirubin Urine: NEGATIVE
Bilirubin Urine: NEGATIVE
Hgb urine dipstick: NEGATIVE
Hgb urine dipstick: NEGATIVE
Ketones, ur: 40 mg/dL — AB
Ketones, ur: NEGATIVE mg/dL
Nitrite: NEGATIVE
Protein, ur: NEGATIVE mg/dL
Protein, ur: NEGATIVE mg/dL
Specific Gravity, Urine: 1.007 (ref 1.005–1.030)
Specific Gravity, Urine: 1.015 (ref 1.005–1.030)
Urobilinogen, UA: 0.2 mg/dL (ref 0.0–1.0)
Urobilinogen, UA: 1 mg/dL (ref 0.0–1.0)
pH: 7 (ref 5.0–8.0)

## 2011-03-03 LAB — DIFFERENTIAL
Basophils Absolute: 0 10*3/uL (ref 0.0–0.1)
Basophils Absolute: 0 10*3/uL (ref 0.0–0.1)
Basophils Absolute: 0 10*3/uL (ref 0.0–0.1)
Basophils Absolute: 0.1 10*3/uL (ref 0.0–0.1)
Basophils Relative: 0 % (ref 0–1)
Basophils Relative: 0 % (ref 0–1)
Basophils Relative: 0 % (ref 0–1)
Basophils Relative: 0 % (ref 0–1)
Basophils Relative: 2 % — ABNORMAL HIGH (ref 0–1)
Eosinophils Absolute: 0.2 10*3/uL (ref 0.0–0.7)
Eosinophils Absolute: 0.1 10*3/uL (ref 0.0–0.7)
Eosinophils Absolute: 0.2 10*3/uL (ref 0.0–0.7)
Eosinophils Relative: 3 % (ref 0–5)
Eosinophils Relative: 4 % (ref 0–5)
Eosinophils Relative: 2 % (ref 0–5)
Eosinophils Relative: 3 % (ref 0–5)
Lymphocytes Relative: 24 % (ref 12–46)
Lymphocytes Relative: 27 % (ref 12–46)
Lymphs Abs: 1.4 10*3/uL (ref 0.7–4.0)
Lymphs Abs: 1.5 10*3/uL (ref 0.7–4.0)
Monocytes Absolute: 0.4 10*3/uL (ref 0.1–1.0)
Monocytes Absolute: 0.5 10*3/uL (ref 0.1–1.0)
Monocytes Absolute: 0.6 10*3/uL (ref 0.1–1.0)
Monocytes Relative: 9 % (ref 3–12)
Monocytes Relative: 4 % (ref 3–12)
Neutro Abs: 4.1 10*3/uL (ref 1.7–7.7)
Neutro Abs: 5.3 10*3/uL (ref 1.7–7.7)
Neutrophils Relative %: 75 % (ref 43–77)
Neutrophils Relative %: 56 % (ref 43–77)

## 2011-03-03 LAB — GLUCOSE, CAPILLARY: Glucose-Capillary: 85 mg/dL (ref 70–99)

## 2011-03-03 LAB — CARDIAC PANEL(CRET KIN+CKTOT+MB+TROPI)
Relative Index: INVALID (ref 0.0–2.5)
Relative Index: INVALID (ref 0.0–2.5)
Total CK: 58 U/L (ref 7–177)
Troponin I: 0.01 ng/mL (ref 0.00–0.06)

## 2011-03-03 LAB — PREGNANCY, URINE: Preg Test, Ur: NEGATIVE

## 2011-03-03 LAB — D-DIMER, QUANTITATIVE: D-Dimer, Quant: 0.73 ug/mL-FEU — ABNORMAL HIGH (ref 0.00–0.48)

## 2011-03-04 LAB — URINE MICROSCOPIC-ADD ON

## 2011-03-04 LAB — URINALYSIS, ROUTINE W REFLEX MICROSCOPIC
Glucose, UA: NEGATIVE mg/dL
Ketones, ur: NEGATIVE mg/dL
Nitrite: NEGATIVE
Specific Gravity, Urine: 1.033 — ABNORMAL HIGH (ref 1.005–1.030)
pH: 5.5 (ref 5.0–8.0)

## 2011-03-04 LAB — URINE CULTURE

## 2011-03-04 LAB — PREGNANCY, URINE: Preg Test, Ur: NEGATIVE

## 2011-03-07 LAB — URINALYSIS, ROUTINE W REFLEX MICROSCOPIC
Hgb urine dipstick: NEGATIVE
Nitrite: NEGATIVE
Protein, ur: NEGATIVE mg/dL
Specific Gravity, Urine: 1.026 (ref 1.005–1.030)
Urobilinogen, UA: 0.2 mg/dL (ref 0.0–1.0)

## 2011-03-07 LAB — DIFFERENTIAL
Basophils Absolute: 0.2 10*3/uL — ABNORMAL HIGH (ref 0.0–0.1)
Basophils Relative: 2 % — ABNORMAL HIGH (ref 0–1)
Lymphocytes Relative: 31 % (ref 12–46)
Neutro Abs: 4.9 10*3/uL (ref 1.7–7.7)
Neutrophils Relative %: 56 % (ref 43–77)

## 2011-03-07 LAB — CBC
Hemoglobin: 13.3 g/dL (ref 12.0–15.0)
MCHC: 34.5 g/dL (ref 30.0–36.0)
Platelets: 288 10*3/uL (ref 150–400)
RDW: 12.3 % (ref 11.5–15.5)

## 2011-03-07 LAB — D-DIMER, QUANTITATIVE: D-Dimer, Quant: 0.67 ug/mL-FEU — ABNORMAL HIGH (ref 0.00–0.48)

## 2011-03-07 LAB — URINE MICROSCOPIC-ADD ON

## 2011-04-09 ENCOUNTER — Emergency Department (HOSPITAL_BASED_OUTPATIENT_CLINIC_OR_DEPARTMENT_OTHER)
Admission: EM | Admit: 2011-04-09 | Discharge: 2011-04-10 | Disposition: A | Discharge status: Home / Self Care | Payer: No Typology Code available for payment source | Attending: Emergency Medicine | Admitting: Emergency Medicine

## 2011-04-09 DIAGNOSIS — R079 Chest pain, unspecified: Secondary | ICD-10-CM | POA: Insufficient documentation

## 2011-04-09 DIAGNOSIS — Y92410 Unspecified street and highway as the place of occurrence of the external cause: Secondary | ICD-10-CM | POA: Insufficient documentation

## 2011-04-09 DIAGNOSIS — M542 Cervicalgia: Secondary | ICD-10-CM | POA: Insufficient documentation

## 2011-04-12 NOTE — Assessment & Plan Note (Signed)
[facility]                                 ON-CALL NOTE   NAME:PAYNE, LAYNEY GILLSON                     MRN:          [id number]  DATE:09/30/2009                            DOB:          06-Nov-1971    PHONE NUMBER:  [phone].   The patient is of Dr. Peggyann Juba.   CHIEF COMPLAINT:  Medicine requisition.   The patient said she usually takes Xanax 0.5 mg p.r.n. for anxiety.  She  has not had any in 2 weeks and she ran out of it.  She has some old  Klonopin at house also 0.5 mg.  She wanted to know if she can substitute  this for the Xanax that she needed it.  I told her that that should be  fine to substitute.  It may be a bit longer acting than Xanax and to use  caution with it, and instructed her to call her doctor in the morning to  get refill on her Xanax.     Marne A. Tower, MD  Electronically Signed    MAT/MedQ  DD: 09/30/2009  DT: 10/01/2009  Job #: [id number]

## 2011-04-15 NOTE — Op Note (Signed)
[facility]  Patient:    Stacy [name], Stacy [name]                     MRN: [id number] Proc. Date: 06/16/00 Adm. Date:  [id number] Disc. Date: [id number] Attending:  Michaelle Copas                           Operative Report  PREOPERATIVE DIAGNOSIS:  Left ectopic pregnancy.  POSTOPERATIVE DIAGNOSIS:  Left ectopic pregnancy plus severe pelvic adhesions.  PROCEDURE:  Mini-laparotomy with left salpingostomy with repair and lysis of adhesions.  SURGEON:  Laqueta Linden, M.D.  ASSISTANT:  Scrub technician.  ANESTHESIA:  General endotracheal.  ESTIMATED BLOOD LOSS:  Less than 100 cc.  COUNTS:  Correct x 2.  COMPLICATIONS:  Removal of front incisor at the time of intubation.  The tooth was noted to be very loose prior to even attempting intubation, and actually appeared to have been held in with a pin.  INDICATIONS:  The patient is a 40 year old gravida 3, para 1, 0-1-1, recently married black female who presented to the triage this evening complaining of some irregular spotting.  She was noted to have a quantitative beta hCG of greater than 22,000.  Her history is notable for an ectopic in the right fallopian tube in March of this year which was treated with methotrexate with clinical resolution.  She also has had one live birth prior to that.  She gives no history of pelvic infection.  Pelvic ultrasound was performed to confirm an IUP which revealed a greater than 5 cm mass in the left adnexa with a 6-week and 3-day gestational sac with a positive fetal heart rate.  There was no free fluid.  The patient was clinically stable with no pain whatsoever. She was admitted for observation with IV fluid, and maintained on p.o., and was brought to the operating room this morning for definitive surgery.  It was determined by the surgeon that in view of the large size of the pregnancy as well as the fetal heart rate, that a mini-laparotomy would be the best  route. The patient was also informed that she might require removal of the left fallopian tube.  She desired conservation of the tube if possible.  The risks, benefits, options, and complications of the procedure were explained to the patient and her husband and they agreed to proceed.  She had received Ancef for IV antibiotic prophylaxis preoperatively.  The patients blood type is A-positive from her previous tubal pregnancy in March per her chart.  DESCRIPTION OF PROCEDURE:  The patient was taken to the operating room and after proper identification and consents were obtained.  She was placed on the operating table in the supine position.  During intubation, it was noted that one of her front incisors was very loose prior to any manipulation of her mouth.  It was felt appropriate to remove the tooth due to the risk of aspiration.  Indeed, it appeared that the tooth was being held in place by some type of a pin.  The tooth was placed in a sterile container and this will be discussed by anesthesia with her husband and subsequently with the patient. Intubation was then accomplished without difficulty.  A mini-laparotomy incision was made suprapubically and the incision was carried down to the fascia which was incised in the routine fashion.  The rectus muscles were separated and the parietal peritoneum was identified. The  incision was extended superiorly and inferiorly at the level of the bladder. There was noted to be omental adhesions draped across the uterine fundus and both adnexal regions, obscuring visualization.  Sharp and blunt dissection was required in order to free this up.  The cul-de-sac was noted to be obliterated by filmy and dense adhesions as well.  Inspection of the right fallopian tube revealed a few filmy adhesions with an otherwise normal-appearing fimbriated end and normal-appearing tubes.  These adhesions were sharply lysed with excellent freeing up of that tube.   The left fallopian tube was distended in the ampullary region with a 5 cm pregnancy.  There was no active bleeding noted.  The fimbriated end appeared normal, although there dense adhesions that needed to be freed up in order to mobilize the tubes.  Due to the patients desire to retain her tube, an attempt was made of a salpingostomy.  The needlepoint cautery was used to open the tube along its antimesenteric border, for a total length of approximately 1.5 cm with extrusion of this pregnancy.  There was a significant amount of oozing from the bed of this consistent with the presumed large amount of placental tissue present.  Cautery was utilized without significant abatement of the bleeding.  A suture of 4-0 Vicryl was then used to place two closed salpingostomy incisions.  This was observed with good hemostasis and no dilatation or hematosalpinx appearing to develop.  Copious lavage was accomplished.  It was felt that further lysis of adhesions of the cul-de-sac anteriorly would reformed.  It was felt that both tubes and ovaries were mobilized. Several hundred cc of warm lactated Ringers were left in the peritoneal cavity. All instruments and lap packs were removed.  Needle, sponge, and instrument counts were correct prior to closure of the abdomen.  The parietal peritoneum was closed in a running fashion with 2-0 Vicryl suture.  The fascia was closed from both the lateral aspect to the midline using 0 PDS which were tied in the midline.  Subcutaneous hemostasis was ascertained.  The skin was closed with staples, and Steri-Strips and pressure dressings were applied.  The patient was stable and transferred to the recovery room.  The tooth situation will be discussed with anesthesia, with her husband, and subsequently with the patient when she awakens.  Otherwise, there were no complications.  Estimated blood loss less than 100 cc. DD:  06/16/00 TD:  06/19/00 Job: [id number]

## 2011-04-15 NOTE — Discharge Summary (Signed)
[facility]  Patient:    Stacy [name], Stacy [name] Visit Number: [id number] MRN: [id number]          Service Type: PSY Location: [facility] Attending Physician:  Benjaman Pott Dictated by:   Jeanice Lim, M.D. Admit Date:  09/10/2001 Discharge Date: 09/24/2001                             Discharge Summary  IDENTIFYING DATA:  This is a 40 year old married African-American female voluntarily admitted for depression and homicidal thoughts.  MEDICATIONS: 1. Prozac 20 mg, which she had been on for one month, reporting compliance. 2. Klonopin 0.5 mg p.r.n.  ALLERGIES:   No known drug allergies.  PHYSICAL EXAMINATION:  GENERAL:  Essentially within normal limits.  VITAL SIGNS:  Stable, afebrile.  NEUROLOGIC:  Intact.  ROUTINE ADMISSION LABORATORY DATA:  Essentially within normal limits.  Urine toxicity screen: Negative.  Thyroid profile: Within normal limits.  CBC with differential and comprehensive metabolic panel: Normal.  MENTAL STATUS EXAMINATION:  The patient was an alert African-American female, casually dressed, good eye contact.  Speech:  Within normal limits.  Mood: Depressed.  Affect: Depressed and sad.  Thought processes were somewhat ruminating on thoughts to harm her child but she does not want to act on them, ego-dystonic.  No suicidal, homicidal, or violent ideations, no paranoia, no clear psychotic symptoms.  Cognitive: Intact.  Memory: Good.  Judgment was impaired.  Insight: Good, noting need for inpatient treatment.  ADMITTING DIAGNOSES: Axis I:    1. 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            2. Post-traumatic stress disorder.            3. Rule out obsessive-compulsive disorder. Axis II:   Mixed personality traits. Axis III:  None. Axis IV:   Problems, mild psychosocial stressors. Axis V:    30/75  HOSPITAL COURSE:  The patient was admitted with routine  p.r.n. medications ordered and individual, group, and milieu therapy.  The patient was stabilized on medications, initially started on Lexapro 10 mg q.a.m. due to lack of response from Prozac and Seroquel 12.5 mg q.h.s.  Seroquel was discontinued and Risperdal started at 1 mg q.h.s. due to severity of obsessive thinking and depressive symptoms, to also help with impulse control.  Lexapro was optimized.  The patient reported tolerating this without side effects.  After being visited by her outpatient psychiatrist, she requested to be discharged replaced on Prozac due to the lack of inadequate therapeutic trial.  She agreed to follow up with the intensive outpatient program.  However, then the patient the next morning refused Prozac, stating that she tolerated Lexapro better.  It was restarted and she reported motivation to be compliant with the follow-up plan.  CONDITION AT DISCHARGE:  Less depressed and anxious, no more distress from obsessive thoughts to harm child or self.  No dangerous ideations.  Judgment and insight had improved.  DISCHARGE MEDICATIONS: 1. Lexapro 10 mg q.a.m. 2. Risperdal 1 mg q.h.s. 3. Klonopin 0.5 mg one half to one q.12h. p.r.n. anxiety.  RECOMMENDATIONS:  The patient was recommended to be off work until reevaluated as an outpatient, return to the ER if symptoms worsen or side effects occur, and intensive outpatient appointment Monday, October 14, at 9 a.m. Dictated by:   Guy Sandifer  Kathrynn Running, M.D. Attending Physician:  Carolanne Grumbling D DD:  10/17/01 TD:  10/20/01 Job: [id number]

## 2011-04-15 NOTE — H&P (Signed)
NAMECARMESHA, MOROCCO                        ACCOUNT NO.:  [id number]   MEDICAL RECORD NO.:  [id number]                   PATIENT TYPE:  MAT   LOCATION:  [facility]                                 FACILITY:   PHYSICIAN:  Richardean Sale, M.D.                DATE OF BIRTH:  16-Jan-1971   DATE OF ADMISSION:  09/11/2003  DATE OF DISCHARGE:                                HISTORY & PHYSICAL   CHIEF COMPLAINT:  Term intrauterine pregnancy with maternal anxiety and  favorable cervix.   HISTORY OF PRESENT ILLNESS:  This is a 40 year old gravida 5, para 1-0-3-1  at 25 and [redacted] weeks gestation by 20 weeks ultrasound, consistent with LMP with  a due date of September 24, 2003 who presents for elective induction of labor  secondary to maternal anxiety. The patient has had a relatively  uncomplicated pregnancy with the exception of hyperemesis throughout the  first and second trimesters, gestational anemia which has been corrected  with iron and maternal anxiety. The patient states that she and her husband  have been having marital difficulties and for the past 2 weeks, she feels as  though the baby is not moving adequately. She has been seen in the office  multiple times with daily non-stress tests which have been reactive on  occasion. When they have been non-reactive, they have been followed up by a  biophysical profile either in the office or at the hospital, which has been  8 out of 8. No indication of any of her heart rate tracings showing any  evidence of fetal compromise. There have been no decelerations or episodes  of bradycardia noted. The patient is requesting induction secondary to  anxiety. She says she is no longer able to sleep at night, even that she is  now 38-1/2 weeks, she would like to proceed with this.   OBSTETRIC HISTORY:  Significant for induction at 42 weeks with a previous  pregnancy which resulted in a vaginal birth of an small for gestational age  fetus weighing only 5  pounds. She has had 3 prior ectopic's, one of which  was treated with laparotomy. The other 2 treated with Methotrexate.   GYNECOLOGIC HISTORY:  Significant for abnormal pap smear. She is status post  cryo therapy. Her PAPS have since been normal. No history of STD's.   PAST MEDICAL HISTORY:  Postpartum depression.   PRENATAL LABORATORY DATA:  Blood type is A+, antibody screen negative,  sickle cell trait negative, RPR non-reactive, Rubella titer immune,  hepatitis B surface antigen negative, HIV declined. Urinalysis negative.  Last pap test within normal limits. Gonorrhea and Chlamydia negative.  Parvovirus negative. Glucose challenge at 28 weeks was 78. A 28 week  hemoglobin was 10.1.   PAST SURGICAL HISTORY:  Laparotomy for ectopic pregnancy as indicated above.   MEDICATIONS:  Prenatal vitamins, iron, Zofran p.r.n., Ambien p.r.n.  insomnia.   ALLERGIES:  None.   SOCIAL  HISTORY:  She is married. No tobacco, alcohol or drugs.   FAMILY HISTORY:  Positive for asthma and cancer. No known congenital  anomalies. No inherited birth defects. No bleeding disorders that the  patient is aware of.   REVIEW OF SYSTEMS:  Occasional nausea and vomiting. Denies any headaches,  visual changes or epigastric pain. Positive for insomnia. Positive for  anxiety.   PHYSICAL EXAMINATION:  GENERAL:  She is a well developed, well nourished  African-American female in no apparent distress.  HEART:  Regular rate and rhythm. No murmurs.  LUNGS:  Clear to auscultation bilaterally.  ABDOMEN:  Gravid, soft, and nontender. Fundal height is appropriate at 39 cm  on September 11, 2003.  PELVIC:  Pelvimetry appears adequate consistent with a gynecoid pelvis.  Cervix on September 11, 2003 was 2, 60% effaced, minus 2 station and vertex.   ASSESSMENT/PLAN:  1. Gravida 5, para 1-0-3-1 at 38 weeks and 5 days gestation, desires     elective induction secondary to maternal anxiety.  Will proceed with     Pitocin and  amniotomy when able. Continuous fetal monitoring. The patient     is aware that she has an increased risk of cesarean section of     approximately 30% to 40% given her Bishop score. She is also aware of the     slight increased risk of fetal lung immaturity given that she is less     than 39 weeks. The patient accepts these risks and desires to proceed     with induction.  2. Group B beta strep positive. Will proceed with penicillin in labor.                                               Richardean Sale, M.D.    JW/MEDQ  D:  09/11/2003  T:  09/11/2003  Job:  [id number]
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
## 2011-04-15 NOTE — Discharge Summary (Signed)
Stacy [name], Stacy [name]                        ACCOUNT NO.:  [id number]   MEDICAL RECORD NO.:  [id number]                   PATIENT TYPE:  INP   LOCATION:  9127                                 FACILITY:  [facility]   PHYSICIAN:  Richardean Sale, M.D.                DATE OF BIRTH:  [birth year]   DATE OF ADMISSION:  09/15/2003  DATE OF DISCHARGE:  09/18/2003                                 DISCHARGE SUMMARY   ADMITTING DIAGNOSES:  Term intrauterine pregnancy.  Presents for elective  induction.   DISCHARGE DIAGNOSES:  1. Term intrauterine pregnancy.  Presents for elective induction.  2. Breech presentation.  3. Status post cesarean section.   PROCEDURE:  Primary low transverse cesarean section performed on September 15, 2003.   HOSPITAL COURSE:  This is a 40 year old gravida 5, para 1-0-3-1 who  presented at 63 weeks and 5 days gestation with a due date of September 24, 2003 for elective induction secondary to maternal anxiety.  The patient's  pregnancy was complicated by hyperemesis in the first and second trimesters  as well as gestational anemia.  The patient also reported intermittent  episodes of decreased fetal movement during the last few weeks of her  pregnancy with increasing fetal surveillance including nonstress tests and  biophysical profiles all being within normal limits.  At her last visit in  the office the patient was known to be vertex on ultrasound examination.  However, on the morning of admission the patient stated that she felt as  though the baby had been very active the night before.  On vaginal  examination her cervix was 2 cm, 60% effaced, -2 station, and questionable  breech presentation.  Ultrasound at bed side confirmed breech presentation  and the patient was therefore counseled on options of management including  attempted external cephalic version with induction of labor if version  successful versus expectant management to see if version occurred  spontaneously, option of primary cesarean section.  The risks of each option  of management were reviewed with the patient and after careful consideration  and consultation with her husband the patient elected to proceed with  primary cesarean section for breech presentation.  This was performed on  September 15, 2003 and resulted in delivery of a viable female infant with  Apgars of 8 and 9.  Infant in a double footling breech presentation.  Nuchal  cord x1.  Intact placenta and three vessel cord.  The patient's  postoperative course was unremarkable.  She did complain of some urinary  frequency and dysuria on the day of discharge.  Urinalysis was sent which  was negative for any nitrites, leukocyte esterase, and only positive for a  small amount of blood consistent with recent catheterization.   DISCHARGE INSTRUCTIONS:  The patient was given a prescription for Percocet  one to two tablets p.o. q.4-6h. p.r.n. pain as well as instructions to take  ibuprofen  600-800 mg p.o. q.6-8h. p.r.n. pain.  Staples were removed prior  to discharge.  She was given instructions to follow up in the office in four  to six weeks for a routine postpartum visit.  Routine postpartum activity  instructions were reviewed with the patient prior to discharge.                                               Richardean Sale, M.D.    JW/MEDQ  D:  10/12/2003  T:  10/12/2003  Job:  [id number]

## 2011-04-15 NOTE — Op Note (Signed)
[facility]  Patient:    [name], Stacy [name]                       MRN: [id number] Proc. Date: 01/24/00 Adm. Date:  [id number] Disc. Date: [id number] Attending:  Mickle Mallory                           Operative Report  PREOPERATIVE DIAGNOSIS:       Rule out incomplete abortion.  POSTOPERATIVE DIAGNOSIS:      Rule out incomplete abortion.  OPERATION:                    Dilatation and evacuation.  SURGEON:                      Richard D. Arlyce Dice, M.D.  ASSISTANT:  ANESTHESIA:                   Paracervical block with IM Demerol and vistaril sedation.  ESTIMATED BLOOD LOSS:         10 cc.  FINDINGS:                     Tissue compatible with an early pregnancy.  INDICATIONS:                  This is a 40 year old, gravida 2, para 1, who presented to the emergency room in early pregnancy with pain and bleeding.  The  patient has had low levels of quantitative hCGs which were rising slowly; 89 on  February 19, 96 on February 23, and 125 on February 26.  Ultrasound was done which showed no definite intrauterine pregnancy.  Because the pregnancy was obviously not progressing normally based upon the hCGs and the ultrasound, the decision was made to proceed with a D&E in the emergency room to see if the diagnosis of incomplete abortion could be made to help rule out the diagnosis of ectopic pregnancy.  DESCRIPTION OF PROCEDURE:     The patient who was already in the emergency room, was placed in the dorsal lithotomy position.  IM Demerol and Vistaril were administered.  20 cc of 1% lidocaine was then placed in the paracervical tissues. The cervix was grasped with a single tooth tenaculum and a #7 curet was introduced into the uterine cavity without the need for dilatation.  The endometrial cavity was then evacuated and the tissue was sent for frozen section and the procedure was terminated at that time.  ADDENDUM:                     The  frozen section diagnosis showed no pregnancy tissue and the patient was treated medically with methotrexate with the diagnosis of unruptured ectopic pregnancy. DD:  03/28/00 TD:  03/30/00 Job: [id number]

## 2011-04-15 NOTE — Discharge Summary (Signed)
[facility]  Patient:    Stacy [name], Stacy [name]                     MRN: [id number] Adm. Date:  [id number] Disc. Date: [id number] Attending:  Michaelle Copas Dictator:   Zella Ball, M.D.                           Discharge Summary  DATE OF BIRTH:                    05-07-1971  ADMISSION DIAGNOSIS:              Left-sided ectopic pregnancy.  DISCHARGE DIAGNOSIS:              Left ectopic pregnancy status post left salpingostomy with repair of left ovarian abscess.  HISTORY OF PRESENT ILLNESS:       The patient is a 40 year old, gravida 3, para 1, whose last menstrual period was May 27, 2000, who presented on July 19 to the maternity admissions unit with vaginal spotting x 5 days but no abdominal pain.  The patient reported increased urinary frequency and urgency; however, no burning.  She did complain of nausea.  The patient had a previous ectopic pregnancy in March 2001 which was treated with methotrexate, and patient was concerned because her symptoms appeared to her to be similar.  PROCEDURES: 1. The patient had a transvaginal ultrasound performed on June 16, 2000, which    revealed a viable ectopic pregnancy in the left adnexa, gestational age of    [redacted] weeks 3 days. 2. Left salpingostomy with repair of left ovarian abscess on June 16, 2000.    This operative note has been previously dictated.  HOSPITAL COURSE:                  The patient was admitted with left ectopic pregnancy measuring 5 x 3.5 x 4.5 cm which was felt to be too large to be amenable to methotrexate surgery; therefore, the patient was admitted to Dr. Talmage Coin service for salpingectomy which was performed on June 16, 2000.  Report was previously dictated.  The patient tolerated the procedure well.  On July 20 in the afternoon, the patient was transferred from Dr. Lonn Georgia service to the teaching service for the remainder of her stay. The rest of the patients hospital  course was uncomplicated, and she was discharged on June 18, 2000, with her pain much diminished and tolerating a normal diet.  LABORATORY DATA:                  On presentation, the patient had GC and chlamydia probes which were both negative as well as a wet prep which was positive for clue cells.  CONDITION ON DISCHARGE:           The patient was in stable condition on discharge.  DISCHARGE MEDICATIONS:            Doxycycline 100 mg, and Flagyl 50 mg, a 7-day course b.i.d. for her Trichomonas infection as well as Tylox and Motrin for postoperative pain.  DISCHARGE INSTRUCTIONS:           The patient was instructed to return to the clinic if she had a fever or noticed any drainage or gaping from her incision.  FOLLOWUP:  The patient was instructed to follow up in two weeks to the office of Dr. Myrlene Broker for routine postoperative check. DD:  06/18/00 TD:  06/21/00 Job: [id number]

## 2011-04-15 NOTE — H&P (Signed)
Stacy [name], Stacy [name]                          ACCOUNT NO.:  [id number]   MEDICAL RECORD NO.:  [id number]                   PATIENT TYPE:   LOCATION:                                       FACILITY:  [facility]   PHYSICIAN:  Richardean Sale, M.D.                DATE OF BIRTH:   DATE OF ADMISSION:  09/03/2003  DATE OF DISCHARGE:                                HISTORY & PHYSICAL   Office chart number [phone].   ADMITTING DIAGNOSES:  Thirty-seven-week intrauterine pregnancy with  decreased fetal movement, nonreactive NST, and abnormal biophysical profile.   HISTORY OF PRESENT ILLNESS:  This is a 40 year old gravida 5, para 1-0-3-1,  African-American female who presented to the office at 37 weeks for routine  visit with complaints of decreased fetal movement.  The patient reports  occasional Braxton-Hicks contractions, no loss of fluid and no vaginal  bleeding.  Past obstetrical history is significant for a postdates induction  with a vaginal birth of an SGA baby that was only 5 pounds.  She has had a  history of three prior ectopics - one was treated surgically and the other  two were treated with methotrexate.  This pregnancy was confirmed by a 20-  week ultrasound which set her due date at September 24, 2003.  Ultrasound  obtained at 34 weeks was consistent with this due date.  The patient's  prenatal care was originally at Dr. Elliot Gault' office.  She has had  complications with hyperemesis but otherwise no problems.  She transferred  to our office at [redacted] weeks gestation.  The patient's pregnancy is also  complicated by positive group B beta strep.   PAST OBSTETRICAL HISTORY:  As above.   PAST GYNECOLOGIC HISTORY:  History of cryotherapy for an abnormal Pap smear.   PAST MEDICAL HISTORY:  None.   PAST SURGICAL HISTORY:  Laparotomy for ectopic pregnancy.   PAST FAMILY HISTORY:  Significant for asthma and cancer.  No inherited  genetic diseases.  No history of cystic fibrosis, birth defects,  cerebral  palsy, or trisomy.   SOCIAL HISTORY:  She is married.  No tobacco, alcohol, or drugs.   OBSTETRIC LABORATORIES:  Blood type A positive, antibody screen negative,  sickle trait negative, RPR nonreactive, rubella immune, hepatitis B surface  antigen negative, Pap smear test within normal limits, gonorrhea and  Chlamydia negative, declined HIV, declined triple screen, 1-hour glucola 78,  hemoglobin at 28 weeks was 10.1, positive group B beta strep.   PHYSICAL EXAMINATION:  Blood pressure is 144/72, she is afebrile, heart  tones are 162.  Fundal height is appropriate.  In general, she is a well-  developed, well-nourished African-American female who appears worried.  Mood  and affect are consistent with anxiety.  Abdomen is gravid, soft, and  nontender.  Fundal height is appropriate.  Cervix is 1, thick, with no  palpable presenting part.  NST in the office  showed a baseline of 140s with  small accelerations noted but there were no 15x15 accelerations within a 20-  minute period.  Biophysical profile was performed which showed 2 for fluid,  no tone, no fetal breathing, and movement only noted with stimulation of the  fetus with the ultrasound probe.  Position is oblique vertex.   ASSESSMENT:  [age]-year-old gravida 5, para 1-0-3-1, African-American  female with nonreactive NST and nonreassuring biophysical profile.   PLAN:  We will send to maternity admissions.  We will hydrate with D-5 LR  and place the patient on the monitor.  If the strip is still not reactive,  there is no movement noted, we will proceed with induction of labor if  vertex, if not cesarean section.  If the heart rate tracing becomes reactive  and the ultrasound is reassuring we will follow expectantly.                                               Richardean Sale, M.D.    JW/MEDQ  D:  09/03/2003  T:  09/03/2003  Job:  [id number]

## 2011-04-15 NOTE — Op Note (Signed)
NAMEANSLEE, Stacy [name]                        ACCOUNT NO.:  [id number]   MEDICAL RECORD NO.:  [id number]                   PATIENT TYPE:  INP   LOCATION:  9127                                 FACILITY:  [facility]   PHYSICIAN:  Richardean Sale, M.D.                DATE OF BIRTH:  December 09, 1970   DATE OF PROCEDURE:  09/15/2003  DATE OF DISCHARGE:                                 OPERATIVE REPORT   PREOPERATIVE DIAGNOSIS:  38-5/7 weeks intrauterine pregnancy in the breech  presentation.   POSTOPERATIVE DIAGNOSIS:  38-5/7 weeks intrauterine pregnancy in the breech  presentation.   PROCEDURES:  Primary low transverse cesarean section by Pfannenstiel.   SURGEON:  Richardean Sale, M.D.   ASSISTANT:  Pershing Cox, M.D.   ANESTHESIA:  Spinal.   COMPLICATIONS:  None.   ESTIMATED BLOOD LOSS:  750 mL.   FINDINGS:  A viable female infant in the double footling breech presentation  with Apgars of 8 and 9.  Nuchal coronary disease x 1.  Intact placenta with  three-vessel cord.  Normal-appearing ovaries and tubes.  Normal-appearing  uterus.  Umbilical artery cord pH 7.09.   INDICATIONS FOR SURGERY:  This is a 40 year old, gravida 5, para 1-0-3-1, at  38-5/[redacted] weeks gestation, who was scheduled for elective induction of labor  secondary to multiparity and maternal anxiety.  The patient has had trouble  with anxiety throughout the pregnancy, especially in the last few weeks and  has been concerned about fetal well being.  She states that the baby has not  been moving well.  She has had almost daily NSTs, all of which have either  been reactive or if nonreactive followed up by biophysical profile which has  been reassuring with 8/8.  On the morning of induction, the patient was  admitted.  Stacy IV was started and she stated she felt as though there was a  lot less pressure in the pelvis as compared to previous.  Stacy ultrasound was  performed at the bedside, which confirmed a breech presentation.   Options of  expectant management with reevaluation in three to four days to look for  change in fetal position with spontaneous conversion to vertex versus  external cephalic version versus primary cesarean section were reviewed with  the patient.  All risks of each procedure were reviewed.  The patient and  her husband discussed and decided to proceed with primary cesarean section.  The risks of the procedure were reviewed with the patient, including, but  not limited to hemorrhage requiring transfusion, infection, injury to intra-  abdominal organs, including bowel, bladder, ureters, or other organs,  anesthetic-related complications, and even death.  The patient and her  husband voiced understanding of these conditions and agreed to proceed.  Informed consent was obtained and all questions were answered.   DESCRIPTION OF PROCEDURE:  The patient was taken to the operating room with  Stacy IV running.  Spinal anesthetic was placed without difficulty.  The  patient was placed in the supine position.  She was prepped and draped in  the usual sterile fashion with Hibiclens prep.  A Foley catheter was placed.  Once anesthesia was tested and was adequate, a Pfannenstiel skin incision  was then made through a prior laparotomy scar.  This was carried sharply to  the fascia.  The fascia was then incised in the midline.  There was a dense  scar of the fascial tissue noted and the fascial incision was extended  laterally with the Mayo scissors.  The superior and inferior aspects of the  fascia were grasped with Kocher clamps, elevated, and the underlying rectus  muscle dissected off with both sharp and blunt dissection.  There was dense  scar between the rectus abdominus muscles.  The midline was then carefully  grasped between hemostats and then retracted with Metzenbaum scissors.  Once  the peritoneum was identified, this was entered sharply.  Clear of any  bowel.  There were no intraperitoneal  adhesions noted.  The peritoneal  incision was extended superiorly and inferiorly with good visualization of  the bladder.  The bladder blade was then inserted and the vesicouterine  peritoneum was identified, grasped with pickups, and entered sharply with  Metzenbaum scissors.  This incision was then extended laterally and the  bladder flap was created digitally.   The bladder blade was then reinserted and the lower uterine segment was  incised in a transverse fashion with a scalpel.  The incision was then  extended with the bandage scissors.  Amniotomy was performed and there was  clear fluid noted.  The infant's feet were then grasped and the fetus was  delivered double footling breech by bringing the legs through the incision  followed by delivery of the hips with a towel around the lower torso.  The  arms were then swept across the chest and the head was delivered in a flexed  position.  There was a nuchal cord x 1.  This was reduced.  The cord was  clamped and cut and the infant was handed off to the awaiting NICU  attendant.  Cord blood and cord gases were then collected.  The placenta was  then removed spontaneously.  The uterus was cleared of all clot and debris  in the uterus.  The incision was repaired with 1-0 chromic in a running lock  fashion.  A second layer of the same suture was placed in imbricating  fashion.  Once hemostasis was assured, any additional areas of bleeding were  cauterized with the Bovie and a few individual figure-of-eight sutures were  placed for additional hemostasis along the incision.  The angles were dry.  The adnexa were palpated and visualized and were normal.  The patient had  had a prior partial salpingectomy, but we were unable to identify this at  the time of her surgery.  The patient was unsure of which tube had been  operated on.  Both fimbriated ends appeared normal and there were no adhesions around the fallopian tubes.  At this point, the  abdomen was  irrigated copiously with warm normal saline.  The uterine incision was  reinspected and was hemostatic.  The subfascial areas were then inspected  and were hemostatic.  The fascia was closed with a running Vicryl suture.  Any subcutaneous areas of bleeding were cauterized with the Bovie.  The skin  was then closed with staples.   The patient tolerated  the procedure well.  The sponge, needle, and  instrument counts were correct x 2.  The patient was taken to the recovery  room awake and in stable condition.                                               Richardean Sale, M.D.    JW/MEDQ  D:  09/15/2003  T:  09/15/2003  Job:  [id number]

## 2011-04-29 ENCOUNTER — Encounter: Payer: Self-pay | Admitting: Family

## 2011-06-24 ENCOUNTER — Encounter (HOSPITAL_BASED_OUTPATIENT_CLINIC_OR_DEPARTMENT_OTHER): Payer: Self-pay | Admitting: *Deleted

## 2011-06-24 ENCOUNTER — Emergency Department (HOSPITAL_BASED_OUTPATIENT_CLINIC_OR_DEPARTMENT_OTHER)
Admission: EM | Admit: 2011-06-24 | Discharge: 2011-06-24 | Disposition: A | Discharge status: Home / Self Care | Payer: Self-pay | Attending: Emergency Medicine | Admitting: Emergency Medicine

## 2011-06-24 DIAGNOSIS — R519 Headache, unspecified: Secondary | ICD-10-CM

## 2011-06-24 DIAGNOSIS — K219 Gastro-esophageal reflux disease without esophagitis: Secondary | ICD-10-CM | POA: Insufficient documentation

## 2011-06-24 DIAGNOSIS — J329 Chronic sinusitis, unspecified: Secondary | ICD-10-CM | POA: Insufficient documentation

## 2011-06-24 DIAGNOSIS — R51 Headache: Secondary | ICD-10-CM | POA: Insufficient documentation

## 2011-06-24 DIAGNOSIS — F341 Dysthymic disorder: Secondary | ICD-10-CM | POA: Insufficient documentation

## 2011-06-24 MED ORDER — DEXAMETHASONE SODIUM PHOSPHATE 10 MG/ML IJ SOLN
10.0000 mg | Freq: Once | INTRAMUSCULAR | Status: AC
  Administered 2011-06-24: 10 mg via INTRAVENOUS
  Filled 2011-06-24: qty 1

## 2011-06-24 MED ORDER — METOCLOPRAMIDE HCL 5 MG/ML IJ SOLN
10.0000 mg | Freq: Once | INTRAMUSCULAR | Status: AC
  Administered 2011-06-24: 10 mg via INTRAVENOUS
  Filled 2011-06-24: qty 2

## 2011-06-24 MED ORDER — SODIUM CHLORIDE 0.9 % IV BOLUS (SEPSIS)
1000.0000 mL | Freq: Once | INTRAVENOUS | Status: AC
  Administered 2011-06-24: 1000 mL via INTRAVENOUS

## 2011-06-24 MED ORDER — DOXYCYCLINE HYCLATE 100 MG PO TABS
100.0000 mg | ORAL_TABLET | Freq: Once | ORAL | Status: AC
  Administered 2011-06-24: 100 mg via ORAL
  Filled 2011-06-24: qty 1

## 2011-06-24 MED ORDER — DOXYCYCLINE HYCLATE 100 MG PO CAPS: 100.0000 mg | ORAL_CAPSULE | Freq: Two times a day (BID) | ORAL | Status: AC

## 2011-06-24 MED ORDER — KETOROLAC TROMETHAMINE 30 MG/ML IJ SOLN
30.0000 mg | Freq: Once | INTRAMUSCULAR | Status: AC
  Administered 2011-06-24: 30 mg via INTRAVENOUS
  Filled 2011-06-24: qty 1

## 2011-06-24 NOTE — ED Notes (Signed)
Patient states this past Monday, she developed pain in her right face which is now progressed to generalized headache around her frontal and maxillary sinuses .  States pain is associated with dental pain.

## 2011-06-24 NOTE — ED Provider Notes (Signed)
History     Chief Complaint  Patient presents with  . Sinusitis   HPI Comments: No prior history of migraines. She denies significant photophonophobia. No vision changes, weakness, paresthesias. This is not the worse headache of her life. Does not have a history of prior headaches. Headache was gradual in onset. He does have a history of sinusitis. Pain is located frontal, temporal, maxillary. His improve slightly with Motrin. She denies nasal congestion, fever. Also states she's had a lot of recent dental work and initially thought her headache originated from her teeth. Denies chest pain, shortness of breath, abdominal pain, vomiting.  Patient is a 40 y.o. female presenting with headaches. The history is provided by the patient. No language interpreter was used.  Headache  This is a new problem. The current episode started more than 2 days ago (5 days ago). The problem occurs constantly. The problem has been gradually worsening. The headache is associated with nothing. The pain is located in the temporal and frontal (maxillary) region. The quality of the pain is described as throbbing. The pain is moderate. The pain does not radiate. Associated symptoms include nausea. Pertinent negatives include no anorexia, no fever, no malaise/fatigue, no near-syncope, no palpitations, no syncope, no shortness of breath and no vomiting. She has tried NSAIDs for the symptoms. The treatment provided mild relief.    Past Medical History  Diagnosis Date  . GERD (gastroesophageal reflux disease)   . Depression   . Anxiety   . Esophageal stricture   . Fibroids     uterine  . Chest pain 09/14/2009    no pulmonary embolus by chest CT  . Palpitations   . Elevated BP     Past Surgical History  Procedure Date  . Cesarean section     No family history on file.  History  Substance Use Topics  . Smoking status: Never Smoker   . Smokeless tobacco: Not on file  . Alcohol Use: Yes     social    OB  History    Grav Para Term Preterm Abortions TAB SAB Ect Mult Living   4 2              Review of Systems  Constitutional: Negative for fever, chills, malaise/fatigue, activity change and appetite change.  HENT: Positive for dental problem and sinus pressure. Negative for ear pain, congestion, sore throat, rhinorrhea, neck pain and neck stiffness.   Respiratory: Negative for cough, chest tightness and shortness of breath.   Cardiovascular: Negative for chest pain, palpitations, syncope and near-syncope.  Gastrointestinal: Positive for nausea. Negative for vomiting, abdominal pain, diarrhea, constipation and anorexia.  Genitourinary: Negative for dysuria, urgency, frequency, decreased urine volume and difficulty urinating.  Musculoskeletal: Negative for back pain.  Neurological: Positive for headaches. Negative for dizziness, syncope, weakness, light-headedness and numbness.  All other systems reviewed and are negative.    Physical Exam  BP 133/78  Pulse 97  Temp(Src) 99.2 F (37.3 C) (Oral)  SpO2 99%  Physical Exam  Nursing note and vitals reviewed. Constitutional: She is oriented to person, place, and time. She appears well-developed and well-nourished. No distress.  HENT:  Head: Normocephalic and atraumatic.  Mouth/Throat: Oropharynx is clear and moist.       No dental abnormality identified  Eyes: Conjunctivae and EOM are normal. Pupils are equal, round, and reactive to light.  Neck: Normal range of motion. Neck supple.  Cardiovascular: Normal rate, regular rhythm, normal heart sounds and intact distal pulses.  No murmur heard. Pulmonary/Chest: Effort normal and breath sounds normal. No respiratory distress.  Abdominal: Soft. Bowel sounds are normal. There is no tenderness.  Neurological: She is alert and oriented to person, place, and time. No cranial nerve deficit.       5/5 strength in the upper and lower kidneys bilaterally. Sensation intact distally. She denies  tenderness over the maxillary and frontal sinuses. Oropharynx is clear.  Skin: Skin is warm and dry. No rash noted.    ED Course  Procedures  MDM 1. cephalgia I'm not concerned about a malignant cause for headache such as subarachnoid hemorrhage or meningitis at this time. The patient has no neurologic symptoms and this is not her worst headache of life. Headache was gradual has not had any characteristics to suggest subarachnoid hemorrhage. Patient received a headache cocktail with resolution of her symptoms. Patient states she's feeling better and wishes to be discharged home. She also wishes to be treated for sinusitis. I showed her to rest for the remainder of the day progressed results. instructed to return if she develops any neurologic symptoms, persistent vomiting, worsening headache.     Dayton Bailiff, MD 06/24/11 [id number]

## 2011-06-26 ENCOUNTER — Encounter (HOSPITAL_BASED_OUTPATIENT_CLINIC_OR_DEPARTMENT_OTHER): Payer: Self-pay | Admitting: Emergency Medicine

## 2011-06-26 ENCOUNTER — Emergency Department (HOSPITAL_BASED_OUTPATIENT_CLINIC_OR_DEPARTMENT_OTHER)
Admission: EM | Admit: 2011-06-26 | Discharge: 2011-06-26 | Disposition: A | Discharge status: Home / Self Care | Payer: Self-pay | Attending: Emergency Medicine | Admitting: Emergency Medicine

## 2011-06-26 ENCOUNTER — Emergency Department (INDEPENDENT_AMBULATORY_CARE_PROVIDER_SITE_OTHER): Payer: Self-pay

## 2011-06-26 DIAGNOSIS — K029 Dental caries, unspecified: Secondary | ICD-10-CM | POA: Insufficient documentation

## 2011-06-26 DIAGNOSIS — R079 Chest pain, unspecified: Secondary | ICD-10-CM

## 2011-06-26 DIAGNOSIS — J9819 Other pulmonary collapse: Secondary | ICD-10-CM

## 2011-06-26 DIAGNOSIS — F341 Dysthymic disorder: Secondary | ICD-10-CM | POA: Insufficient documentation

## 2011-06-26 DIAGNOSIS — R748 Abnormal levels of other serum enzymes: Secondary | ICD-10-CM

## 2011-06-26 DIAGNOSIS — K219 Gastro-esophageal reflux disease without esophagitis: Secondary | ICD-10-CM | POA: Insufficient documentation

## 2011-06-26 DIAGNOSIS — E876 Hypokalemia: Secondary | ICD-10-CM | POA: Insufficient documentation

## 2011-06-26 LAB — D-DIMER, QUANTITATIVE: D-Dimer, Quant: 0.92 ug/mL-FEU — ABNORMAL HIGH (ref 0.00–0.48)

## 2011-06-26 LAB — CARDIAC PANEL(CRET KIN+CKTOT+MB+TROPI)
CK, MB: 1 ng/mL (ref 0.3–4.0)
Relative Index: INVALID (ref 0.0–2.5)
Total CK: 56 U/L (ref 7–177)

## 2011-06-26 LAB — CBC
HCT: 32.6 % — ABNORMAL LOW (ref 36.0–46.0)
Hemoglobin: 11.5 g/dL — ABNORMAL LOW (ref 12.0–15.0)
MCH: 28.7 pg (ref 26.0–34.0)
MCHC: 35.3 g/dL (ref 30.0–36.0)
MCV: 81.3 fL (ref 78.0–100.0)

## 2011-06-26 LAB — BASIC METABOLIC PANEL
BUN: 11 mg/dL (ref 6–23)
Calcium: 8.8 mg/dL (ref 8.4–10.5)
GFR calc non Af Amer: >60 mL/min (ref >60)
Glucose, Bld: 87 mg/dL (ref 70–99)

## 2011-06-26 MED ORDER — ACETAMINOPHEN 325 MG PO TABS
650.0000 mg | ORAL_TABLET | Freq: Once | ORAL | Status: AC
  Administered 2011-06-26: 650 mg via ORAL
  Filled 2011-06-26: qty 2

## 2011-06-26 MED ORDER — MORPHINE SULFATE 4 MG/ML IJ SOLN
INTRAMUSCULAR | Status: AC
  Filled 2011-06-26: qty 1

## 2011-06-26 MED ORDER — POTASSIUM CHLORIDE CRYS ER 20 MEQ PO TBCR
40.0000 meq | EXTENDED_RELEASE_TABLET | Freq: Once | ORAL | Status: AC
  Administered 2011-06-26: 40 meq via ORAL
  Filled 2011-06-26: qty 2

## 2011-06-26 MED ORDER — NITROGLYCERIN 0.4 MG SL SUBL
0.4000 mg | SUBLINGUAL_TABLET | SUBLINGUAL | Status: DC | PRN
Start: 2011-06-26 — End: 2011-06-26
  Administered 2011-06-26: 0.4 mg via SUBLINGUAL
  Filled 2011-06-26: qty 25

## 2011-06-26 MED ORDER — ONDANSETRON HCL 4 MG/2ML IJ SOLN
4.0000 mg | Freq: Once | INTRAMUSCULAR | Status: AC
  Administered 2011-06-26: 4 mg via INTRAVENOUS

## 2011-06-26 MED ORDER — ONDANSETRON HCL 4 MG/2ML IJ SOLN
INTRAMUSCULAR | Status: AC
  Administered 2011-06-26: 4 mg via INTRAVENOUS
  Filled 2011-06-26: qty 2

## 2011-06-26 MED ORDER — FENTANYL CITRATE 0.05 MG/ML IJ SOLN
50.0000 ug | Freq: Once | INTRAMUSCULAR | Status: AC
  Administered 2011-06-26: 50 ug via INTRAVENOUS
  Filled 2011-06-26: qty 2

## 2011-06-26 MED ORDER — IOHEXOL 350 MG/ML SOLN
100.0000 mL | Freq: Once | INTRAVENOUS | Status: AC | PRN
  Administered 2011-06-26: 100 mL via INTRAVENOUS

## 2011-06-26 MED ORDER — GI COCKTAIL ~~LOC~~
ORAL | Status: AC
  Administered 2011-06-26: 30 mL via ORAL
  Filled 2011-06-26: qty 30

## 2011-06-26 MED ORDER — ASPIRIN 81 MG PO CHEW
324.0000 mg | CHEWABLE_TABLET | Freq: Once | ORAL | Status: AC
  Administered 2011-06-26: 324 mg via ORAL

## 2011-06-26 MED ORDER — ASPIRIN 81 MG PO CHEW
CHEWABLE_TABLET | ORAL | Status: AC
  Administered 2011-06-26: 324 mg via ORAL
  Filled 2011-06-26: qty 4

## 2011-06-26 MED ORDER — PENICILLIN V POTASSIUM 250 MG PO TABS: 250.0000 mg | ORAL_TABLET | Freq: Four times a day (QID) | ORAL | Status: AC

## 2011-06-26 MED ORDER — GI COCKTAIL ~~LOC~~
30.0000 mL | Freq: Once | ORAL | Status: AC
  Administered 2011-06-26: 30 mL via ORAL

## 2011-06-26 MED ORDER — HYDROCODONE-ACETAMINOPHEN 5-500 MG PO TABS: 1.0000 | ORAL_TABLET | Freq: Four times a day (QID) | ORAL | Status: AC | PRN

## 2011-06-26 NOTE — ED Notes (Signed)
Pt having chest pain x 3 days.  Pt describes it as a tightness in center of chest, non radiating, with some nausea and SOB.  Slight diaphoresis.  No vomitting.  No cardiac history.

## 2011-06-26 NOTE — ED Provider Notes (Signed)
History     Chief Complaint  Patient presents with  . Chest Pain   HPI Comments: Pt states that she has a history of similar symptoms but it has been a while:pt has been seen by [facility] and he has a history of esophageal stricture,pt states that she has also a history of reflux and anxiety and she has stopped taking her medications:pt state that she started exercising and has some wt loss ans she felt like she wasn't needing her medications any more  Patient is a 40 y.o. female presenting with chest pain. The history is provided by the patient. No language interpreter was used.  Chest Pain The chest pain began 3 - 5 days ago. Chest pain occurs frequently. The chest pain is worsening. The severity of the pain is moderate. The quality of the pain is described as pressure-like. The pain does not radiate. Primary symptoms include shortness of breath and palpitations. Pertinent negatives for primary symptoms include no fever, no syncope, no cough, no wheezing, no nausea, no vomiting and no dizziness.  The palpitations also occurred with shortness of breath. The palpitations did not occur with dizziness.   Pertinent negatives for associated symptoms include no diaphoresis, no lower extremity edema, no near-syncope, no numbness and no weakness. She tried nothing for the symptoms. Risk factors include stress.     Past Medical History  Diagnosis Date  . GERD (gastroesophageal reflux disease)   . Depression   . Anxiety   . Esophageal stricture   . Fibroids     uterine  . Chest pain 09/14/2009    no pulmonary embolus by chest CT  . Palpitations   . Elevated BP     Past Surgical History  Procedure Date  . Cesarean section     History reviewed. No pertinent family history.  History  Substance Use Topics  . Smoking status: Never Smoker   . Smokeless tobacco: Not on file  . Alcohol Use: Yes     social    OB History    Grav Para Term Preterm Abortions TAB SAB Ect Mult  Living   4 2              Review of Systems  Constitutional: Negative for fever and diaphoresis.  Respiratory: Positive for shortness of breath. Negative for cough and wheezing.   Cardiovascular: Positive for chest pain and palpitations. Negative for syncope and near-syncope.  Gastrointestinal: Negative for nausea and vomiting.  Neurological: Negative for dizziness, weakness and numbness.  All other systems reviewed and are negative.    Physical Exam  BP 130/83  Pulse 76  Temp(Src) 98.8 F (37.1 C) (Oral)  Resp 18  SpO2 99%  LMP 03/27/2011  Physical Exam  Nursing note and vitals reviewed. Constitutional: She appears well-developed and well-nourished.  HENT:  Head: Normocephalic and atraumatic.  Neck: Normal range of motion. Neck supple.  Cardiovascular: Normal rate and regular rhythm.   Pulmonary/Chest: No respiratory distress. She exhibits tenderness.  Abdominal: Soft. Bowel sounds are normal.  Musculoskeletal: Normal range of motion.  Neurological: She is alert.  Skin: Skin is warm and dry.  Psychiatric: She has a normal mood and affect.    Date: 06/26/2011  Rate: 94  Rhythm: normal sinus rhythm  QRS Axis: normal  Intervals: normal  ST/T Wave abnormalities: normal  Conduction Disutrbances:nonspecific intraventricular conduction delay  Narrative Interpretation:   Old EKG Reviewed: unchanged  ED Course  Procedures Results for orders placed during the hospital encounter of 06/26/11  CBC      Component Value Range   WBC 7.5  4.0 - 10.5 (K/uL)   RBC 4.01  3.87 - 5.11 (MIL/uL)   Hemoglobin 11.5 (*) 12.0 - 15.0 (g/dL)   HCT 16.1 (*) 09.6 - 46.0 (%)   MCV 81.3  78.0 - 100.0 (fL)   MCH 28.7  26.0 - 34.0 (pg)   MCHC 35.3  30.0 - 36.0 (g/dL)   RDW 04.5  40.9 - 81.1 (%)   Platelets 311  150 - 400 (K/uL)  BASIC METABOLIC PANEL      Component Value Range   Sodium 140  135 - 145 (mEq/L)   Potassium 2.9 (*) 3.5 - 5.1 (mEq/L)   Chloride 104  96 - 112 (mEq/L)   CO2  27  19 - 32 (mEq/L)   Glucose, Bld 87  70 - 99 (mg/dL)   BUN 11  6 - 23 (mg/dL)   Creatinine, Ser 9.14  0.50 - 1.10 (mg/dL)   Calcium 8.8  8.4 - 78.2 (mg/dL)   GFR calc non Af Amer >60  >60 (mL/min)   GFR calc Af Amer >60  >60 (mL/min)  CK TOTAL AND CKMB      Component Value Range   Total CK 60  7 - 177 (U/L)   CK, MB 1.0  0.3 - 4.0 (ng/mL)   Relative Index RELATIVE INDEX IS INVALID  0.0 - 2.5   TROPONIN I      Component Value Range   Troponin I <0.30  <0.30 (ng/mL)  D-DIMER, QUANTITATIVE      Component Value Range   D-Dimer, Quant 0.92 (*) 0.00 - 0.48 (ug/mL-FEU)  CARDIAC PANEL(CRET KIN+CKTOT+MB+TROPI)      Component Value Range   Total CK PENDING  7 - 177 (U/L)   CK, MB 1.0  0.3 - 4.0 (ng/mL)   Troponin I <0.30  <0.30 (ng/mL)   Relative Index PENDING  0.0 - 2.5    Ct Angio Chest W/cm &/or Wo Cm  06/26/2011  *RADIOLOGY REPORT*  Clinical Data:  chest pain with an elevated D-dimer  CT ANGIOGRAPHY CHEST WITH CONTRAST  Technique:  Multidetector CT imaging of the chest was performed using the standard protocol during bolus administration of intravenous contrast.  Multiplanar CT image reconstructions including MIPs were obtained to evaluate the vascular anatomy.  Contrast:  100 ml Omnipaque 350  Comparison:  02/02/2010  Findings:  Pulmonary arteries are well visualized.  No filling defect or significant acute pulmonary embolus demonstrated by CTA. Pulsation motion artifact noted of the thoracic aorta.  Negative for aneurysm or definite dissection.  Normal heart size.  No pericardial or pleural effusion.  No hiatal hernia.  Upper abdomen demonstrates no acute process.  No adenopathy.  Lung windows demonstrate basilar atelectasis otherwise clear. Negative for pneumonia, collapse, consolidation, interstitial process, or edema.  Central airways are patent.  No acute osseous finding.  Review of the MIP images confirms the above findings.  IMPRESSION: Negative for significant acute pulmonary embolus  by CTA.  No acute intrathoracic finding.  Minimal bibasilar atelectasis.  Original Report Authenticated By: Judie Petit. Ruel Favors, M.D.   Dg Chest Portable 1 View  06/26/2011  *RADIOLOGY REPORT*  Clinical Data: Chest pain  PORTABLE CHEST - 1 VIEW  Comparison: 02/02/2010  Findings: Artifact overlies the chest.  Heart size is normal.  The lungs are clear.  The vascularity is normal.  No effusions.  No significant bony finding.  IMPRESSION: No active disease  Original Report Authenticated By:  Thomasenia Sales, M.D.   4:56 PM Pt c/o left side tooth pain, pt states that she has had recent oral surgery and that she feels like the left side of her face is swollen, mild swelling noted to left cheek at this time;will treat pt with discharge  MDM Pt was scheduled to have a stress test and and holter with [facility] and pt missed the appointment:pt has a timi score of 0:will refer back to [facility]:pt is cp free at this time:no sign of pe or pneumonia      Teressa Lower, NP 06/26/11 1657

## 2011-06-30 ENCOUNTER — Emergency Department (HOSPITAL_BASED_OUTPATIENT_CLINIC_OR_DEPARTMENT_OTHER)
Admission: EM | Admit: 2011-06-30 | Discharge: 2011-06-30 | Disposition: A | Discharge status: Home / Self Care | Payer: Self-pay | Attending: Emergency Medicine | Admitting: Emergency Medicine

## 2011-06-30 ENCOUNTER — Encounter (HOSPITAL_BASED_OUTPATIENT_CLINIC_OR_DEPARTMENT_OTHER): Payer: Self-pay | Admitting: *Deleted

## 2011-06-30 ENCOUNTER — Emergency Department (INDEPENDENT_AMBULATORY_CARE_PROVIDER_SITE_OTHER): Payer: Self-pay

## 2011-06-30 DIAGNOSIS — R209 Unspecified disturbances of skin sensation: Secondary | ICD-10-CM

## 2011-06-30 DIAGNOSIS — R51 Headache: Secondary | ICD-10-CM | POA: Insufficient documentation

## 2011-06-30 MED ORDER — ACETAMINOPHEN 325 MG PO TABS
650.0000 mg | ORAL_TABLET | Freq: Once | ORAL | Status: AC
  Administered 2011-06-30: 650 mg via ORAL
  Filled 2011-06-30: qty 2

## 2011-06-30 NOTE — ED Provider Notes (Signed)
History     CSN: [id number] Arrival date & time: 06/30/2011  6:06 PM  Chief Complaint  Patient presents with  . Headache   The history is provided by the patient.  Pt reports she has had a diffuse throbbing, pressure headache for the last week or so. She was seen in the ED a few days ago for same and felt to have sinusitis. Given meds in the ED which helped, but headache came back. She had another ED visit about 4 days ago for chest pain and had extensive workup including labs, CTA and EKG which was all normal. She is concerned about the continued headache. Does not have any other neurologic complaints. No numbness, weakness. She reports her vision has been 'off' and she has been salivating in her mouth more than normal.   Past Medical History  Diagnosis Date  . GERD (gastroesophageal reflux disease)   . Depression   . Anxiety   . Esophageal stricture   . Fibroids     uterine  . Chest pain 09/14/2009    no pulmonary embolus by chest CT  . Palpitations   . Elevated BP     Past Surgical History  Procedure Date  . Cesarean section     No family history on file.  History  Substance Use Topics  . Smoking status: Never Smoker   . Smokeless tobacco: Not on file  . Alcohol Use: Yes     social    OB History    Grav Para Term Preterm Abortions TAB SAB Ect Mult Living   4 2              Review of Systems  All other systems reviewed and are negative.    Physical Exam  BP 143/91  Pulse 101  Temp(Src) 99.2 F (37.3 C) (Oral)  Resp 20  SpO2 100%  LMP 03/27/2011  Physical Exam  Nursing note and vitals reviewed. Constitutional: She is oriented to person, place, and time. She appears well-developed and well-nourished.  HENT:  Head: Normocephalic and atraumatic.  Eyes: EOM are normal. Pupils are equal, round, and reactive to light.  Neck: Normal range of motion. Neck supple.  Cardiovascular: Normal rate, normal heart sounds and intact distal pulses.   Pulmonary/Chest:  Effort normal and breath sounds normal.  Abdominal: Bowel sounds are normal. She exhibits no distension. There is no tenderness.  Musculoskeletal: Normal range of motion. She exhibits no edema and no tenderness.  Neurological: She is alert and oriented to person, place, and time. She displays normal reflexes. No cranial nerve deficit. She exhibits normal muscle tone. Coordination normal.  Skin: Skin is warm and dry. No rash noted.  Psychiatric: She has a normal mood and affect.    ED Course  Procedures  MDM Discussed with the patient that we would do a CT for her headache but did not need to repeat labs due to recent ED eval. She is in agreement. Asks only for APAP for her pain here.     CT neg, pt feeling better, does not want any additional pain medications.   Charles B. Bernette Mayers, MD 06/30/11 4098

## 2011-06-30 NOTE — ED Notes (Signed)
Headache for a week. Blurred vision. Nausea. States this is her 3rd visit for same. She feels no better.

## 2011-07-16 NOTE — ED Provider Notes (Signed)
Medical screening examination/treatment/procedure(s) were performed by non-physician practitioner and as supervising physician I was immediately available for consultation/collaboration.   Rolan Bucco, MD 07/16/11 [phone]

## 2011-08-23 LAB — POCT CARDIAC MARKERS
CKMB, poc: <1 — ABNORMAL LOW
Myoglobin, poc: 46.1
Operator id: 4074
Troponin i, poc: <0.05

## 2011-08-23 LAB — PREGNANCY, URINE: Preg Test, Ur: NEGATIVE

## 2011-08-26 LAB — CBC
HCT: 36.5
Hemoglobin: 12.5
MCHC: 34.2
MCV: 86.8
RBC: 4.2
RDW: 13

## 2011-08-26 LAB — BASIC METABOLIC PANEL
CO2: 27
Chloride: 106
GFR calc Af Amer: >60
Glucose, Bld: 96
Potassium: 3.5
Sodium: 137

## 2011-08-26 LAB — HEPATIC FUNCTION PANEL
AST: 18
Bilirubin, Direct: <0.1
Total Bilirubin: 0.5

## 2011-08-26 LAB — URINALYSIS, ROUTINE W REFLEX MICROSCOPIC
Hgb urine dipstick: NEGATIVE
Nitrite: NEGATIVE
Specific Gravity, Urine: 1.034 — ABNORMAL HIGH
pH: 6

## 2011-08-26 LAB — URINE MICROSCOPIC-ADD ON

## 2011-08-26 LAB — DIFFERENTIAL
Basophils Absolute: 0.1
Basophils Relative: 1
Eosinophils Relative: 4
Monocytes Absolute: 0.6
Monocytes Relative: 8
Neutro Abs: 3.9

## 2011-11-16 ENCOUNTER — Encounter (HOSPITAL_BASED_OUTPATIENT_CLINIC_OR_DEPARTMENT_OTHER): Payer: Self-pay | Admitting: *Deleted

## 2011-11-16 ENCOUNTER — Emergency Department (INDEPENDENT_AMBULATORY_CARE_PROVIDER_SITE_OTHER): Payer: Self-pay

## 2011-11-16 ENCOUNTER — Emergency Department (HOSPITAL_BASED_OUTPATIENT_CLINIC_OR_DEPARTMENT_OTHER)
Admission: EM | Admit: 2011-11-16 | Discharge: 2011-11-16 | Disposition: A | Discharge status: Home / Self Care | Payer: Self-pay | Attending: Emergency Medicine | Admitting: Emergency Medicine

## 2011-11-16 ENCOUNTER — Other Ambulatory Visit: Payer: Self-pay

## 2011-11-16 DIAGNOSIS — R079 Chest pain, unspecified: Secondary | ICD-10-CM | POA: Insufficient documentation

## 2011-11-16 DIAGNOSIS — R110 Nausea: Secondary | ICD-10-CM

## 2011-11-16 LAB — BASIC METABOLIC PANEL
BUN: 7 mg/dL (ref 6–23)
CO2: 28 mEq/L (ref 19–32)
Calcium: 9.3 mg/dL (ref 8.4–10.5)
Glucose, Bld: 77 mg/dL (ref 70–99)
Potassium: 3.6 mEq/L (ref 3.5–5.1)
Sodium: 140 mEq/L (ref 135–145)

## 2011-11-16 LAB — CBC
Hemoglobin: 13 g/dL (ref 12.0–15.0)
MCH: 28.5 pg (ref 26.0–34.0)
MCHC: 35.2 g/dL (ref 30.0–36.0)
MCV: 80.9 fL (ref 78.0–100.0)
RBC: 4.56 MIL/uL (ref 3.87–5.11)

## 2011-11-16 LAB — CARDIAC PANEL(CRET KIN+CKTOT+MB+TROPI)
CK, MB: 1.4 ng/mL (ref 0.3–4.0)
Total CK: 88 U/L (ref 7–177)

## 2011-11-16 MED ORDER — KETOROLAC TROMETHAMINE 30 MG/ML IJ SOLN
30.0000 mg | Freq: Once | INTRAMUSCULAR | Status: AC
  Administered 2011-11-16: 30 mg via INTRAVENOUS
  Filled 2011-11-16: qty 1

## 2011-11-16 NOTE — ED Notes (Signed)
Pt sts "I am under so much stress at work, do you think stress can cause this pain".

## 2011-11-16 NOTE — ED Provider Notes (Addendum)
History     CSN: [id number] Arrival date & time: 11/16/2011  1:56 PM   First MD Initiated Contact with Patient 11/16/11 1358      Chief Complaint  Patient presents with  . Chest Pain   very pleasant, 40 year old female with a known history of GERD. Also, apparently has depression, anxiety, fibroids, chest pain, palpitations. In the past. She states she began having nausea. Last night, and today while at work since 11 AM she began having a painful discomfort across her chest. It is described as "painful". There is no exertional component unrelated to food. Essentially no specific alleviating or aggravating factors. She did take the palms with no relief. She's had no shortness of breath) pain. She had minimal nausea, no shortness of breath, no diaphoresis, no numbness, weakness or tingling. The pain appears to be reproducible on exam. Patient was apparently seen in the ED in July for similar pains.  (Consider location/radiation/quality/duration/timing/severity/associated sxs/prior treatment) HPI  Past Medical History  Diagnosis Date  . GERD (gastroesophageal reflux disease)   . Depression   . Anxiety   . Esophageal stricture   . Fibroids     uterine  . Chest pain 09/14/2009    no pulmonary embolus by chest CT  . Palpitations   . Elevated BP     Past Surgical History  Procedure Date  . Cesarean section     History reviewed. No pertinent family history.  History  Substance Use Topics  . Smoking status: Never Smoker   . Smokeless tobacco: Not on file  . Alcohol Use: Yes     social    OB History    Grav Para Term Preterm Abortions TAB SAB Ect Mult Living   4 2              Review of Systems  Allergies  Diphenhydramine hcl; Hydromorphone hcl; and Morphine  Home Medications   Current Outpatient Rx  Name Route Sig Dispense Refill  . VITAMIN D 1000 UNITS PO CAPS Oral Take 1,000 Units by mouth daily. 2 tabs by mouth daily    . CITALOPRAM HYDROBROMIDE 20 MG PO TABS  Oral Take 20 mg by mouth daily.     Marland Kitchen CLONAZEPAM 0.5 MG PO TABS Oral Take 0.5 mg by mouth as needed.     . DEXLANSOPRAZOLE 60 MG PO CPDR Oral Take 60 mg by mouth daily.     . IBUPROFEN 200 MG PO TABS Oral Take 400 mg by mouth as needed. headache and pain      Pulse 82  Temp(Src) 98.8 F (37.1 C) (Oral)  Resp 16  Ht 5\' 5"  (1.651 m)  Wt 190 lb (86.183 kg)  BMI 31.62 kg/m2  SpO2 100%  Physical Exam  ED Course  Procedures (including critical care time)   Labs Reviewed  CARDIAC PANEL(CRET KIN+CKTOT+MB+TROPI)  BASIC METABOLIC PANEL  CBC  D-DIMER, QUANTITATIVE  PREGNANCY, URINE   No results found.   No diagnosis found.    MDM   Date: 11/16/2011  Rate: 81  Rhythm: normal sinus rhythm  QRS Axis: normal  Intervals: normal  ST/T Wave abnormalities: normal  Conduction Disutrbances:none and Incomplete RBBB  Narrative Interpretation:   Old EKG Reviewed: unchanged  No change compared to June 26, 2011   CTA was done at that time:  Showed  :  IMPRESSION:  Negative for significant acute pulmonary embolus by CTA. No acute  intrathoracic finding.  Minimal bibasilar atelectasis.      3:11 PM Results  for orders placed during the hospital encounter of 11/16/11  CBC      Component Value Range   WBC 6.5  4.0 - 10.5 (K/uL)   RBC 4.56  3.87 - 5.11 (MIL/uL)   Hemoglobin 13.0  12.0 - 15.0 (g/dL)   HCT 16.1  09.6 - 04.5 (%)   MCV 80.9  78.0 - 100.0 (fL)   MCH 28.5  26.0 - 34.0 (pg)   MCHC 35.2  30.0 - 36.0 (g/dL)   RDW 40.9  81.1 - 91.4 (%)   Platelets 380  150 - 400 (K/uL)  PREGNANCY, URINE      Component Value Range   Preg Test, Ur NEGATIVE     No results found.           Jerimie Mancuso A. Patrica Duel, MD 11/16/11 1511  Dr. Ignacia Palma will assume care. Patient remains stable, chest pain-free. Anticipate discharge after initial studies. Return. Very unlikely to be cardiac related.  Jolane Bankhead A. Patrica Duel, MD 11/16/11 [phone]

## 2011-11-16 NOTE — ED Notes (Signed)
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

## 2011-11-16 NOTE — ED Provider Notes (Signed)
3:49 PM Pt's lab tests show negative cardiac markers, and slightly elevated D-dimer.  She has had 2 negative CT angios of her chest this year, and one in 2010, so will not repeat that test.  Released, to followup with [facility], Metro Kung, RNP.    Carleene Cooper III, MD 11/16/11 [phone]

## 2011-12-10 ENCOUNTER — Other Ambulatory Visit: Payer: Self-pay

## 2011-12-10 ENCOUNTER — Emergency Department (HOSPITAL_BASED_OUTPATIENT_CLINIC_OR_DEPARTMENT_OTHER)
Admission: EM | Admit: 2011-12-10 | Discharge: 2011-12-10 | Disposition: A | Discharge status: Home / Self Care | Payer: Self-pay | Attending: Emergency Medicine | Admitting: Emergency Medicine

## 2011-12-10 ENCOUNTER — Encounter (HOSPITAL_BASED_OUTPATIENT_CLINIC_OR_DEPARTMENT_OTHER): Payer: Self-pay | Admitting: *Deleted

## 2011-12-10 DIAGNOSIS — F341 Dysthymic disorder: Secondary | ICD-10-CM | POA: Insufficient documentation

## 2011-12-10 DIAGNOSIS — R1013 Epigastric pain: Secondary | ICD-10-CM | POA: Insufficient documentation

## 2011-12-10 DIAGNOSIS — K219 Gastro-esophageal reflux disease without esophagitis: Secondary | ICD-10-CM | POA: Insufficient documentation

## 2011-12-10 DIAGNOSIS — Z79899 Other long term (current) drug therapy: Secondary | ICD-10-CM | POA: Insufficient documentation

## 2011-12-10 DIAGNOSIS — R112 Nausea with vomiting, unspecified: Secondary | ICD-10-CM | POA: Insufficient documentation

## 2011-12-10 LAB — PREGNANCY, URINE: Preg Test, Ur: NEGATIVE

## 2011-12-10 LAB — COMPREHENSIVE METABOLIC PANEL
Albumin: 3.7 g/dL (ref 3.5–5.2)
Alkaline Phosphatase: 67 U/L (ref 39–117)
BUN: 10 mg/dL (ref 6–23)
CO2: 28 mEq/L (ref 19–32)
Chloride: 103 mEq/L (ref 96–112)
GFR calc non Af Amer: >90 mL/min (ref >90)
Glucose, Bld: 88 mg/dL (ref 70–99)
Potassium: 4 mEq/L (ref 3.5–5.1)
Total Bilirubin: 0.6 mg/dL (ref 0.3–1.2)

## 2011-12-10 LAB — CBC
HCT: 37.4 % (ref 36.0–46.0)
Hemoglobin: 13.2 g/dL (ref 12.0–15.0)
RBC: 4.6 MIL/uL (ref 3.87–5.11)
RDW: 12.9 % (ref 11.5–15.5)
WBC: 4.8 10*3/uL (ref 4.0–10.5)

## 2011-12-10 LAB — LIPASE, BLOOD: Lipase: 14 U/L (ref 11–59)

## 2011-12-10 LAB — URINALYSIS, ROUTINE W REFLEX MICROSCOPIC
Bilirubin Urine: NEGATIVE
Glucose, UA: NEGATIVE mg/dL
Hgb urine dipstick: NEGATIVE
Specific Gravity, Urine: 1.023 (ref 1.005–1.030)

## 2011-12-10 MED ORDER — GI COCKTAIL ~~LOC~~
30.0000 mL | Freq: Once | ORAL | Status: AC
  Administered 2011-12-10: 30 mL via ORAL
  Filled 2011-12-10: qty 30

## 2011-12-10 MED ORDER — ONDANSETRON HCL 4 MG/2ML IJ SOLN
4.0000 mg | Freq: Once | INTRAMUSCULAR | Status: AC
  Administered 2011-12-10: 4 mg via INTRAVENOUS
  Filled 2011-12-10: qty 2

## 2011-12-10 MED ORDER — PANTOPRAZOLE SODIUM 40 MG IV SOLR
40.0000 mg | Freq: Once | INTRAVENOUS | Status: AC
  Administered 2011-12-10: 40 mg via INTRAVENOUS
  Filled 2011-12-10 (×2): qty 40

## 2011-12-10 MED ORDER — DEXLANSOPRAZOLE 60 MG PO CPDR: 60.0000 mg | DELAYED_RELEASE_CAPSULE | Freq: Every day | ORAL | Status: DC

## 2011-12-10 NOTE — ED Notes (Signed)
Patient able to drink ginger ale w/o nausea/vomiting

## 2011-12-10 NOTE — ED Notes (Signed)
Patient states she has a HX of acid reflux and has been managing it with her diet, but started having nausea/stomach pain at work yesterday & started vomiting last night, she states it has grown worse feels like her stomach & throat are burning, no meds taken

## 2011-12-10 NOTE — ED Provider Notes (Signed)
History     CSN: [id number]  Arrival date & time 12/10/11  7829   First MD Initiated Contact with Patient 12/10/11 [phone]      Chief Complaint  Patient presents with  . Emesis    (Consider location/radiation/quality/duration/timing/severity/associated sxs/prior treatment) HPI Patient presents with complaint of epigastric pain and vomiting. She has a history of GERD but has not been taking dexilant recently. She began to have nausea and vomiting yesterday and has had 3 episodes. Emesis was nonbloody and nonbilious. She feels that her reflux had been managed with her diet recently but that she's been under increased stress and reflux is now increased. She feels a burning in her throat stomach and chest which is similar to her prior reflux. She has no shortness of breath and no fever. She tried sleeping sitting upright which did help symptoms minimally. There no other alleviating or modifying factors. There no associated systemic symptoms.  Past Medical History  Diagnosis Date  . GERD (gastroesophageal reflux disease)   . Depression   . Anxiety   . Esophageal stricture   . Fibroids     uterine  . Chest pain 09/14/2009    no pulmonary embolus by chest CT  . Palpitations   . Elevated BP     Past Surgical History  Procedure Date  . Cesarean section     No family history on file.  History  Substance Use Topics  . Smoking status: Never Smoker   . Smokeless tobacco: Not on file  . Alcohol Use: Yes     social    OB History    Grav Para Term Preterm Abortions TAB SAB Ect Mult Living   4 2              Review of Systems ROS reviewed and otherwise negative except for mentioned in HPI  Allergies  Diphenhydramine hcl; Hydromorphone hcl; and Morphine  Home Medications   Current Outpatient Rx  Name Route Sig Dispense Refill  . VITAMIN D 1000 UNITS PO CAPS Oral Take 1,000 Units by mouth daily. 2 tabs by mouth daily    . CITALOPRAM HYDROBROMIDE 20 MG PO TABS Oral Take 20 mg  by mouth daily.     Marland Kitchen CLONAZEPAM 0.5 MG PO TABS Oral Take 0.5 mg by mouth as needed.     . DEXLANSOPRAZOLE 60 MG PO CPDR Oral Take 60 mg by mouth daily.     . DEXLANSOPRAZOLE 60 MG PO CPDR Oral Take 1 capsule (60 mg total) by mouth daily. 30 capsule 0  . IBUPROFEN 200 MG PO TABS Oral Take 400 mg by mouth as needed. headache and pain      BP 123/75  Pulse 77  Temp(Src) 98.8 F (37.1 C) (Oral)  Resp 18  SpO2 100% Vitals reviewed Physical Exam Physical Examination: General appearance - alert, well appearing, and in no distress Mental status - alert, oriented to person, place, and time Eyes - pupils equal and reactive, extraocular eye movements intact Mouth - mucous membranes moist, pharynx normal without lesions Chest - clear to auscultation, no wheezes, rales or rhonchi, symmetric air entry Heart - normal rate, regular rhythm, normal S1, S2, no murmurs, rubs, clicks or gallops Abdomen - soft, mild ttp in epigastrium, no gaurding or rebound, nondistended, no masses or organomegaly Musculoskeletal - no joint tenderness, deformity or swelling Extremities - peripheral pulses normal, no pedal edema, no clubbing or cyanosis Skin - normal coloration and turgor, no rashes  ED Course  Procedures (including  critical care time)   Date: 12/10/2011  Rate: 69  Rhythm: normal sinus rhythm  QRS Axis: normal  Intervals: normal  ST/T Wave abnormalities: normal  Conduction Disutrbances:none  Narrative Interpretation:   Old EKG Reviewed: none available  12:13 PM pt states she feels much improved, has tolerated fluids in ED.      Labs Reviewed  URINALYSIS, ROUTINE W REFLEX MICROSCOPIC - Abnormal; Notable for the following:    Color, Urine AMBER (*) BIOCHEMICALS MAY BE AFFECTED BY COLOR   APPearance CLOUDY (*)    All other components within normal limits  PREGNANCY, URINE  CBC  COMPREHENSIVE METABOLIC PANEL  LIPASE, BLOOD   No results found.   1. GERD (gastroesophageal reflux disease)    2. Nausea and vomiting       MDM  The patient presenting with epigastric discomfort as well as nausea and vomiting. She has a history of chronic reflux which had been well controlled on dexilant. She feels much improved after treatment here in her labs are reassuring as well as a normal EKG. I will prescribe another course of Doxil and for her and she was discharged with strict return precautions. She is agreeable with this plan.        Ethelda Chick, MD 12/10/11 [phone]

## 2012-01-16 ENCOUNTER — Encounter (HOSPITAL_BASED_OUTPATIENT_CLINIC_OR_DEPARTMENT_OTHER): Payer: Self-pay | Admitting: *Deleted

## 2012-01-16 ENCOUNTER — Emergency Department (HOSPITAL_BASED_OUTPATIENT_CLINIC_OR_DEPARTMENT_OTHER)
Admission: EM | Admit: 2012-01-16 | Discharge: 2012-01-17 | Disposition: A | Discharge status: Home / Self Care | Payer: Self-pay | Attending: Emergency Medicine | Admitting: Emergency Medicine

## 2012-01-16 DIAGNOSIS — F3289 Other specified depressive episodes: Secondary | ICD-10-CM | POA: Insufficient documentation

## 2012-01-16 DIAGNOSIS — F329 Major depressive disorder, single episode, unspecified: Secondary | ICD-10-CM | POA: Insufficient documentation

## 2012-01-16 DIAGNOSIS — K219 Gastro-esophageal reflux disease without esophagitis: Secondary | ICD-10-CM | POA: Insufficient documentation

## 2012-01-16 DIAGNOSIS — N390 Urinary tract infection, site not specified: Secondary | ICD-10-CM | POA: Insufficient documentation

## 2012-01-16 LAB — URINALYSIS, MICROSCOPIC ONLY
Ketones, ur: 15 mg/dL — AB
Nitrite: NEGATIVE
Specific Gravity, Urine: 1.039 — ABNORMAL HIGH (ref 1.005–1.030)
pH: 6 (ref 5.0–8.0)

## 2012-01-16 MED ORDER — IBUPROFEN 800 MG PO TABS
800.0000 mg | ORAL_TABLET | Freq: Once | ORAL | Status: AC
  Administered 2012-01-16: 800 mg via ORAL
  Filled 2012-01-16: qty 1

## 2012-01-16 NOTE — ED Provider Notes (Addendum)
History   This chart was scribed for Stacy [name] Smitty Cords, MD by Melba Coon. The patient was seen in room MH04/MH04 and the patient's care was started at 11:27PM.     CSN: [id number]  Arrival date & time 01/16/12  2311   None     Chief Complaint  Patient presents with  . Abdominal Pain    (Consider location/radiation/quality/duration/timing/severity/associated sxs/prior treatment) HPI Stacy [name] is a 41 y.o. female who presents to the Emergency Department complaining of constant, moderate to severe abdominal pain with an onset Wednesday pertaining to episodes of n/v/d. Pt's nausea started 5 days ago, emesis started 4 days ago, and diarrhea started 2 days ago. Today, vomit ahs gone on for 10 hrs; pt has also had suprapubic cramping. Pt has not taken any medications for the present symptoms. Pt has an IUD. No Cp, neck pain, HA, sore throat, extremity pain, extremity edema, vaginal d/c, dysuria, or hematuria. Pt is allergic to Benadryl which causes her to swell and have breathing problems.  Past Medical History  Diagnosis Date  . GERD (gastroesophageal reflux disease)   . Depression   . Anxiety   . Esophageal stricture   . Fibroids     uterine  . Chest pain 09/14/2009    no pulmonary embolus by chest CT  . Palpitations   . Elevated BP     Past Surgical History  Procedure Date  . Cesarean section     History reviewed. No pertinent family history.  History  Substance Use Topics  . Smoking status: Never Smoker   . Smokeless tobacco: Not on file  . Alcohol Use: Yes     social    OB History    Grav Para Term Preterm Abortions TAB SAB Ect Mult Living   4 2              Review of Systems  Constitutional: Negative.   HENT: Negative.   Eyes: Negative.   Respiratory: Negative.   Cardiovascular: Negative.   Gastrointestinal: Positive for vomiting. Negative for diarrhea.  Genitourinary: Negative.   Musculoskeletal: Negative.   Skin: Negative.     Neurological: Negative.   Hematological: Negative.   Psychiatric/Behavioral: Negative.    10 Systems reviewed and are negative for acute change except as noted in the HPI.  Allergies  Diphenhydramine hcl; Hydromorphone hcl; and Morphine  Home Medications   Current Outpatient Rx  Name Route Sig Dispense Refill  . VITAMIN D 1000 UNITS PO CAPS Oral Take 1,000 Units by mouth daily. 2 tabs by mouth daily    . CITALOPRAM HYDROBROMIDE 20 MG PO TABS Oral Take 20 mg by mouth daily.     Marland Kitchen CLONAZEPAM 0.5 MG PO TABS Oral Take 0.5 mg by mouth as needed.     . DEXLANSOPRAZOLE 60 MG PO CPDR Oral Take 60 mg by mouth daily.     . IBUPROFEN 200 MG PO TABS Oral Take 400 mg by mouth as needed. headache and pain      BP 127/83  Pulse 89  Temp(Src) 98.3 F (36.8 C) (Oral)  Resp 17  Ht 5\' 6"  (1.676 m)  Wt 195 lb (88.451 kg)  BMI 31.47 kg/m2  SpO2 100%  Physical Exam  Nursing note and vitals reviewed. Constitutional: She appears well-developed and well-nourished.       Awake, alert, nontoxic appearance.  HENT:  Head: Normocephalic and atraumatic.  Right Ear: External ear normal.  Left Ear: External ear normal.  Mouth/Throat: Oropharynx is clear and  moist.  Eyes: Conjunctivae and EOM are normal. Pupils are equal, round, and reactive to light. Right eye exhibits no discharge. Left eye exhibits no discharge.  Neck: Normal range of motion. Neck supple. No thyromegaly present.  Cardiovascular: Normal rate, regular rhythm and normal heart sounds.   No murmur heard. Pulmonary/Chest: Effort normal and breath sounds normal. She has no wheezes. She exhibits no tenderness.  Abdominal: Soft. Bowel sounds are normal. She exhibits no distension. There is no tenderness. There is no rebound and no guarding.  Musculoskeletal: She exhibits no tenderness.       Baseline ROM, no obvious new focal weakness.  Lymphadenopathy:    She has no cervical adenopathy.  Neurological: She has normal reflexes.        Mental status and motor strength appears baseline for patient and situation.  Skin: Skin is warm and dry. No rash noted.  Psychiatric: She has a normal mood and affect. Her behavior is normal.    ED Course  Procedures (including critical care time)  DIAGNOSTIC STUDIES: Oxygen Saturation is 100% on room air, normal by my interpretation.    COORDINATION OF CARE:  11:31PM - EDMD will order blood work for the pt.   Results for orders placed during the hospital encounter of 01/16/12  URINALYSIS, WITH MICROSCOPIC      Component Value Range   Color, Urine AMBER (*) YELLOW    APPearance CLOUDY (*) CLEAR    Specific Gravity, Urine 1.039 (*) 1.005 - 1.030    pH 6.0  5.0 - 8.0    Glucose, UA NEGATIVE  NEGATIVE (mg/dL)   Hgb urine dipstick TRACE (*) NEGATIVE    Bilirubin Urine SMALL (*) NEGATIVE    Ketones, ur 15 (*) NEGATIVE (mg/dL)   Protein, ur NEGATIVE  NEGATIVE (mg/dL)   Urobilinogen, UA 1.0  0.0 - 1.0 (mg/dL)   Nitrite NEGATIVE  NEGATIVE    Leukocytes, UA TRACE (*) NEGATIVE    WBC, UA 3-6  <3 (WBC/hpf)   RBC / HPF 3-6  <3 (RBC/hpf)   Bacteria, UA FEW (*) RARE    Squamous Epithelial / LPF FEW (*) RARE   PREGNANCY, URINE      Component Value Range   Preg Test, Ur NEGATIVE  NEGATIVE   CBC      Component Value Range   WBC 6.9  4.0 - 10.5 (K/uL)   RBC 4.25  3.87 - 5.11 (MIL/uL)   Hemoglobin 12.2  12.0 - 15.0 (g/dL)   HCT 78.2 (*) 95.6 - 46.0 (%)   MCV 80.0  78.0 - 100.0 (fL)   MCH 28.7  26.0 - 34.0 (pg)   MCHC 35.9  30.0 - 36.0 (g/dL)   RDW 21.3  08.6 - 57.8 (%)   Platelets 308  150 - 400 (K/uL)  DIFFERENTIAL      Component Value Range   Neutrophils Relative 39 (*) 43 - 77 (%)   Neutro Abs 2.7  1.7 - 7.7 (K/uL)   Lymphocytes Relative 49 (*) 12 - 46 (%)   Lymphs Abs 3.4  0.7 - 4.0 (K/uL)   Monocytes Relative 9  3 - 12 (%)   Monocytes Absolute 0.6  0.1 - 1.0 (K/uL)   Eosinophils Relative 2  0 - 5 (%)   Eosinophils Absolute 0.2  0.0 - 0.7 (K/uL)   Basophils Relative 0  0  - 1 (%)   Basophils Absolute 0.0  0.0 - 0.1 (K/uL)     No results found.   No diagnosis  found.    MDM  Vitals and labs reassuring no indication for imaging at this time.  Return for fevers, chills, intractable nausea and vomiting or pain that localizes particularly to the RLQ.  Follow up with your family doctor in 2 days   I personally performed the services described in this documentation, which was scribed in my presence. The recorded information has been reviewed and considered.    Jasmine Awe, MD 01/17/12 0038  Jeannine Pennisi K Tilak Oakley-Rasch, MD 01/17/12 [phone]

## 2012-01-16 NOTE — ED Notes (Signed)
abd pain with n/v/d since Saturday

## 2012-01-17 LAB — CBC
MCH: 28.7 pg (ref 26.0–34.0)
MCV: 80 fL (ref 78.0–100.0)
Platelets: 308 10*3/uL (ref 150–400)
RBC: 4.25 MIL/uL (ref 3.87–5.11)

## 2012-01-17 LAB — BASIC METABOLIC PANEL
BUN: 9 mg/dL (ref 6–23)
Calcium: 9.1 mg/dL (ref 8.4–10.5)
GFR calc non Af Amer: >90 mL/min (ref >90)
Glucose, Bld: 105 mg/dL — ABNORMAL HIGH (ref 70–99)
Sodium: 136 mEq/L (ref 135–145)

## 2012-01-17 LAB — DIFFERENTIAL
Eosinophils Absolute: 0.2 10*3/uL (ref 0.0–0.7)
Eosinophils Relative: 2 % (ref 0–5)
Lymphs Abs: 3.4 10*3/uL (ref 0.7–4.0)

## 2012-01-17 MED ORDER — NITROFURANTOIN MONOHYD MACRO 100 MG PO CAPS: 100.0000 mg | ORAL_CAPSULE | Freq: Two times a day (BID) | ORAL | Status: AC

## 2012-01-17 NOTE — Discharge Instructions (Signed)

## 2012-02-27 ENCOUNTER — Encounter (HOSPITAL_BASED_OUTPATIENT_CLINIC_OR_DEPARTMENT_OTHER): Payer: Self-pay

## 2012-02-27 ENCOUNTER — Emergency Department (HOSPITAL_BASED_OUTPATIENT_CLINIC_OR_DEPARTMENT_OTHER)
Admission: EM | Admit: 2012-02-27 | Discharge: 2012-02-27 | Disposition: A | Discharge status: Home / Self Care | Payer: Self-pay | Attending: Emergency Medicine | Admitting: Emergency Medicine

## 2012-02-27 DIAGNOSIS — S91009A Unspecified open wound, unspecified ankle, initial encounter: Secondary | ICD-10-CM | POA: Insufficient documentation

## 2012-02-27 DIAGNOSIS — F3289 Other specified depressive episodes: Secondary | ICD-10-CM | POA: Insufficient documentation

## 2012-02-27 DIAGNOSIS — IMO0002 Reserved for concepts with insufficient information to code with codable children: Secondary | ICD-10-CM

## 2012-02-27 DIAGNOSIS — K219 Gastro-esophageal reflux disease without esophagitis: Secondary | ICD-10-CM | POA: Insufficient documentation

## 2012-02-27 DIAGNOSIS — Z23 Encounter for immunization: Secondary | ICD-10-CM | POA: Insufficient documentation

## 2012-02-27 DIAGNOSIS — S81009A Unspecified open wound, unspecified knee, initial encounter: Secondary | ICD-10-CM | POA: Insufficient documentation

## 2012-02-27 DIAGNOSIS — F411 Generalized anxiety disorder: Secondary | ICD-10-CM | POA: Insufficient documentation

## 2012-02-27 DIAGNOSIS — W230XXA Caught, crushed, jammed, or pinched between moving objects, initial encounter: Secondary | ICD-10-CM | POA: Insufficient documentation

## 2012-02-27 DIAGNOSIS — F329 Major depressive disorder, single episode, unspecified: Secondary | ICD-10-CM | POA: Insufficient documentation

## 2012-02-27 MED ORDER — TETANUS-DIPHTH-ACELL PERTUSSIS 5-2.5-18.5 LF-MCG/0.5 IM SUSP
0.5000 mL | Freq: Once | INTRAMUSCULAR | Status: AC
  Administered 2012-02-27: 0.5 mL via INTRAMUSCULAR
  Filled 2012-02-27: qty 0.5

## 2012-02-27 NOTE — ED Notes (Signed)
Pt states that last tetanus shot was when she was 14, laceration to L heel, bleeding controlled.  Foot lacerated by screen door.

## 2012-02-27 NOTE — ED Provider Notes (Signed)
History     CSN: [id number]  Arrival date & time 02/27/12  1706   First MD Initiated Contact with Patient 02/27/12 1722      Chief Complaint  Patient presents with  . Foot Pain    (Consider location/radiation/quality/duration/timing/severity/associated sxs/prior treatment) HPI Comments: Pt states that she was walking inside and the door closed on her ankle and it cut her left ankle:pt denies any problems with walking or movement  Patient is a 41 y.o. female presenting with lower extremity pain. The history is provided by the patient. No language interpreter was used.  Foot Pain This is a new problem. The current episode started today. The problem occurs constantly. The problem has been unchanged. The symptoms are aggravated by nothing. She has tried nothing for the symptoms.    Past Medical History  Diagnosis Date  . GERD (gastroesophageal reflux disease)   . Depression   . Anxiety   . Esophageal stricture   . Fibroids     uterine  . Chest pain 09/14/2009    no pulmonary embolus by chest CT  . Palpitations   . Elevated BP     Past Surgical History  Procedure Date  . Cesarean section     History reviewed. No pertinent family history.  History  Substance Use Topics  . Smoking status: Never Smoker   . Smokeless tobacco: Not on file  . Alcohol Use: Yes     social    OB History    Grav Para Term Preterm Abortions TAB SAB Ect Mult Living   4 2              Review of Systems  Constitutional: Negative.   Respiratory: Negative.   Cardiovascular: Negative.   Skin: Positive for wound.  Neurological: Negative.     Allergies  Diphenhydramine hcl; Hydromorphone hcl; and Morphine  Home Medications  No current outpatient prescriptions on file.  BP 130/77  Pulse 85  Temp(Src) 98.1 F (36.7 C) (Oral)  Resp 17  Ht 5\' 6"  (1.676 m)  Wt 195 lb (88.451 kg)  BMI 31.47 kg/m2  SpO2 99%  Physical Exam  Nursing note and vitals reviewed. Constitutional: She is  oriented to person, place, and time. She appears well-developed and well-nourished.  HENT:  Head: Normocephalic and atraumatic.  Cardiovascular: Normal rate and regular rhythm.   Pulmonary/Chest: Effort normal and breath sounds normal.  Musculoskeletal: Normal range of motion.  Neurological: She is oriented to person, place, and time.  Skin:       Pt has a superficial l shaped laceration to the medial aspect of the left ankle    ED Course  LACERATION REPAIR Performed by: Teressa Lower Authorized by: Teressa Lower Consent: Verbal consent obtained. Written consent not obtained. Risks and benefits: risks, benefits and alternatives were discussed Consent given by: patient Patient understanding: patient states understanding of the procedure being performed Patient identity confirmed: verbally with patient Time out: Immediately prior to procedure a "time out" was called to verify the correct patient, procedure, equipment, support staff and site/side marked as required. Body area: lower extremity Location details: left ankle Laceration length: 2 cm Foreign bodies: no foreign bodies Irrigation solution: saline Irrigation method: syringe Amount of cleaning: standard Skin closure: glue and Steri-Strips Approximation: close Approximation difficulty: simple Patient tolerance: Patient tolerated the procedure well with no immediate complications.   (including critical care time)  Labs Reviewed - No data to display No results found.   1. Laceration  MDM   Wound closed without any problem:pts tetanus updated       Teressa Lower, NP 02/27/12 1750

## 2012-02-27 NOTE — Discharge Instructions (Signed)
Laceration Care, Adult A laceration is a cut that goes through all layers of the skin. The cut goes into the tissue beneath the skin. HOME CARE For stitches (sutures) or staples:  Keep the cut clean and dry.   If you have a bandage (dressing), change it at least once a day. Change the bandage if it gets wet or dirty, or as told by your doctor.   Wash the cut with soap and water 2 times a day. Rinse the cut with water. Pat it dry with a clean towel.   Put a thin layer of medicated cream on the cut as told by your doctor.   You may shower after the first 24 hours. Do not soak the cut in water until the stitches are removed.   Only take medicines as told by your doctor.   Have your stitches or staples removed as told by your doctor.  For skin adhesive strips:  Keep the cut clean and dry.   Do not get the strips wet. You may take a bath, but be careful to keep the cut dry.   If the cut gets wet, pat it dry with a clean towel.   The strips will fall off on their own. Do not remove the strips that are still stuck to the cut.  For wound glue:  You may shower or take baths. Do not soak or scrub the cut. Do not swim. Avoid heavy sweating until the glue falls off on its own. After a shower or bath, pat the cut dry with a clean towel.   Do not put medicine on your cut until the glue falls off.   If you have a bandage, do not put tape over the glue.   Avoid lots of sunlight or tanning lamps until the glue falls off. Put sunscreen on the cut for the first year to reduce your scar.   The glue will fall off on its own. Do not pick at the glue.  You may need a tetanus shot if:  You cannot remember when you had your last tetanus shot.   You have never had a tetanus shot.  If you need a tetanus shot and you choose not to have one, you may get tetanus. Sickness from tetanus can be serious. GET HELP RIGHT AWAY IF:   Your pain does not get better with medicine.   Your arm, hand, leg, or  foot loses feeling (numbness) or changes color.   Your cut is bleeding.   Your joint feels weak, or you cannot use your joint.   You have painful lumps on your body.   Your cut is red, puffy (swollen), or painful.   You have a red line on the skin near the cut.   You have yellowish-white fluid (pus) coming from the cut.   You have a fever.   You have a bad smell coming from the cut or bandage.   Your cut breaks open before or after stitches are removed.   You notice something coming out of the cut, such as wood or glass.   You cannot move a finger or toe.  MAKE SURE YOU:   Understand these instructions.   Will watch your condition.   Will get help right away if you are not doing well or get worse.  Document Released: 05/02/2008 Document Revised: 11/03/2011 Document Reviewed: 05/10/2011 ExitCare Patient Information 2012 [employer].Stitches, Staples, or Skin Adhesive Strips  Stitches (sutures), staples, and skin adhesive strips hold   the skin together as it heals. They will usually be in place for 7 days or less. HOME CARE  Wash your hands with soap and water before and after you touch your wound.   Only take medicine as told by your doctor.   Cover your wound only if your doctor told you to. Otherwise, leave it open to air.   Do not get your stitches wet or dirty. If they get dirty, dab them gently with a clean washcloth. Wet the washcloth with soapy water. Do not rub. Pat them dry gently.   Do not put medicine or medicated cream on your stitches unless your doctor told you to.   Do not take out your own stitches or staples. Skin adhesive strips will fall off by themselves.   Do not pick at the wound. Picking can cause an infection.   Do not miss your follow-up appointment.   If you have problems or questions, call your doctor.  GET HELP RIGHT AWAY IF:   You have a temperature by mouth above 102 F (38.9 C), not controlled by medicine.   You have chills.     You have redness or pain around your stitches.   There is puffiness (swelling) around your stitches.   You notice fluid (drainage) from your stitches.   There is a bad smell coming from your wound.  MAKE SURE YOU:  Understand these instructions.   Will watch your condition.   Will get help if you are not doing well or get worse.  Document Released: 09/11/2009 Document Revised: 11/03/2011 Document Reviewed: 09/11/2009 ExitCare Patient Information 2012 [employer]. 

## 2012-02-27 NOTE — ED Provider Notes (Signed)
Medical screening examination/treatment/procedure(s) were performed by non-physician practitioner and as supervising physician I was immediately available for consultation/collaboration.   Stacy [name]. Eugenie Harewood, MD 02/27/12 [id number]

## 2012-03-07 ENCOUNTER — Ambulatory Visit (INDEPENDENT_AMBULATORY_CARE_PROVIDER_SITE_OTHER): Payer: Self-pay | Admitting: Advanced Practice Midwife

## 2012-03-07 ENCOUNTER — Encounter: Payer: Self-pay | Admitting: Physician Assistant

## 2012-03-07 ENCOUNTER — Encounter: Payer: Self-pay | Admitting: *Deleted

## 2012-03-07 VITALS — BP 128/89 | HR 93 | Temp 97.7°F | Resp 16 | Ht 65.0 in | Wt 214.9 lb

## 2012-03-07 DIAGNOSIS — N898 Other specified noninflammatory disorders of vagina: Secondary | ICD-10-CM

## 2012-03-07 DIAGNOSIS — Z3009 Encounter for other general counseling and advice on contraception: Secondary | ICD-10-CM

## 2012-03-07 DIAGNOSIS — Z30011 Encounter for initial prescription of contraceptive pills: Secondary | ICD-10-CM

## 2012-03-07 DIAGNOSIS — Z30432 Encounter for removal of intrauterine contraceptive device: Secondary | ICD-10-CM

## 2012-03-07 MED ORDER — NORGESTIMATE-ETH ESTRADIOL 0.25-35 MG-MCG PO TABS: 1.0000 | ORAL_TABLET | Freq: Every day | ORAL | Status: DC

## 2012-03-07 NOTE — Patient Instructions (Signed)

## 2012-03-07 NOTE — Progress Notes (Signed)
  Subjective:    Patient ID: Stacy [name], female    DOB: Jun 08, 1971, 41 y.o.   MRN: [id number]  HPI: Referred to Gyn clinic from [facility] for removal of IUD. Pt wishes to start OCPs. Was told that there was "bacteria" on Pap. Has has not been sexually active for a long time and declines GC/CT testing.    Review of Systems: Neg     Objective:   Physical Exam  Nursing note and vitals reviewed. Constitutional: She is oriented to person, place, and time. She appears well-developed and well-nourished. No distress.  Cardiovascular: Normal rate.   Pulmonary/Chest: Effort normal.  Abdominal: Soft. There is no tenderness.  Genitourinary: Vagina normal and uterus normal. Uterus is not enlarged and not tender. Cervix exhibits no motion tenderness, no discharge and no friability. Right adnexum displays no mass, no tenderness and no fullness. Left adnexum displays no mass, no tenderness and no fullness. No erythema or bleeding around the vagina. No vaginal discharge found.  Lymphadenopathy:       Right: No inguinal adenopathy present.       Left: No inguinal adenopathy present.  Neurological: She is alert and oriented to person, place, and time.  Skin: Skin is warm and dry.  Psychiatric: She has a normal mood and affect.   Procedure IUD string grasped w/ ring forceps. Removed w/out difficulty. Scant spotting. Pt tolerated well.    Assessment & Plan:   1. Vaginal Discharge  Wet prep, genital  2. OCP (oral contraceptive pills) initiation  norgestimate-ethinyl estradiol (SPRINTEC 28) 0.25-35 MG-MCG tablet  3. Encounter for IUD removal    Ibuprofen PRN F/U in 1 year for Annual  [place], [place] 03/07/2012 5:15 PM

## 2012-03-13 ENCOUNTER — Emergency Department (HOSPITAL_BASED_OUTPATIENT_CLINIC_OR_DEPARTMENT_OTHER)
Admission: EM | Admit: 2012-03-13 | Discharge: 2012-03-13 | Disposition: A | Discharge status: Home / Self Care | Payer: Self-pay | Attending: Emergency Medicine | Admitting: Emergency Medicine

## 2012-03-13 ENCOUNTER — Encounter (HOSPITAL_BASED_OUTPATIENT_CLINIC_OR_DEPARTMENT_OTHER): Payer: Self-pay | Admitting: *Deleted

## 2012-03-13 DIAGNOSIS — M7989 Other specified soft tissue disorders: Secondary | ICD-10-CM | POA: Insufficient documentation

## 2012-03-13 DIAGNOSIS — T148XXA Other injury of unspecified body region, initial encounter: Secondary | ICD-10-CM

## 2012-03-13 DIAGNOSIS — M79609 Pain in unspecified limb: Secondary | ICD-10-CM | POA: Insufficient documentation

## 2012-03-13 DIAGNOSIS — K219 Gastro-esophageal reflux disease without esophagitis: Secondary | ICD-10-CM | POA: Insufficient documentation

## 2012-03-13 DIAGNOSIS — F3289 Other specified depressive episodes: Secondary | ICD-10-CM | POA: Insufficient documentation

## 2012-03-13 DIAGNOSIS — F329 Major depressive disorder, single episode, unspecified: Secondary | ICD-10-CM | POA: Insufficient documentation

## 2012-03-13 DIAGNOSIS — F411 Generalized anxiety disorder: Secondary | ICD-10-CM | POA: Insufficient documentation

## 2012-03-13 DIAGNOSIS — Y849 Medical procedure, unspecified as the cause of abnormal reaction of the patient, or of later complication, without mention of misadventure at the time of the procedure: Secondary | ICD-10-CM | POA: Insufficient documentation

## 2012-03-13 DIAGNOSIS — L089 Local infection of the skin and subcutaneous tissue, unspecified: Secondary | ICD-10-CM

## 2012-03-13 DIAGNOSIS — T8140XA Infection following a procedure, unspecified, initial encounter: Secondary | ICD-10-CM | POA: Insufficient documentation

## 2012-03-13 MED ORDER — CEPHALEXIN 500 MG PO CAPS: 500.0000 mg | ORAL_CAPSULE | Freq: Four times a day (QID) | ORAL | Status: AC

## 2012-03-13 MED ORDER — CEFTRIAXONE SODIUM 1 G IJ SOLR
1.0000 g | Freq: Once | INTRAMUSCULAR | Status: AC
  Administered 2012-03-13: 1 g via INTRAMUSCULAR
  Filled 2012-03-13: qty 10

## 2012-03-13 MED ORDER — SULFAMETHOXAZOLE-TRIMETHOPRIM 800-160 MG PO TABS: 1.0000 | ORAL_TABLET | Freq: Two times a day (BID) | ORAL | Status: AC

## 2012-03-13 MED ORDER — LIDOCAINE HCL (PF) 1 % IJ SOLN
INTRAMUSCULAR | Status: AC
  Administered 2012-03-13: 5 mL
  Filled 2012-03-13: qty 5

## 2012-03-13 NOTE — Discharge Instructions (Signed)
Wound Infection °A wound infection happens when a type of germ (bacteria) grows in a wound. Caring for the infection can help the wound heal. Wound infections need treatment. °HOME CARE  °· Only take medicine as told by your doctor.  °· Take your antibiotic medicine as told. Finish it even if you start to feel better.  °· Clean the wound with mild soap and water as told. Rinse the soap off. Pat the area dry with a clean towel. Do not rub the wound.  °· Change any bandages (dressings) as told by your doctor.  °· Put cream and a bandage on the wound as told by your doctor.  °· If the bandage sticks, wet it with soapy water to remove the bandage.  °· Change the bandage if it gets wet, dirty, or starts to smell.  °· Take showers. Do not take baths, swim, or do anything that puts your wound under water.  °· Avoid exercise that makes you sweat.  °· If your wound itches, use a medicine that helps stop itching. Do not pick or scratch at the wound.  °· Keep all doctor visits as told.  °GET HELP RIGHT AWAY IF:  °· You have more puffiness (swelling), pain, or redness around the wound.  °· You have more yellowish-white fluid (pus) coming from the wound.  °· You have a bad smell coming from the wound.  °· Your wound breaks open more.  °· You have a fever.  °MAKE SURE YOU:  °· Understand these instructions.  °· Will watch your condition.  °· Will get help right away if you are not doing well or get worse.  °Document Released: 08/23/2008 Document Revised: 11/03/2011 Document Reviewed: 04/25/2011 °ExitCare® Patient Information ©2012 [employer]. °

## 2012-03-13 NOTE — ED Provider Notes (Signed)
History     CSN: [id number]  Arrival date & time 03/13/12  2032   First MD Initiated Contact with Patient 03/13/12 2059      Chief Complaint  Patient presents with  . Foot Injury    (Consider location/radiation/quality/duration/timing/severity/associated sxs/prior treatment) Patient is a 41 y.o. female presenting with foot injury. The history is provided by the patient. No language interpreter was used.  Foot Injury  Incident onset: april 1. The incident occurred at home. The injury mechanism was an incision. The pain is present in the left foot. The quality of the pain is described as aching. The pain is at a severity of 5/10. The pain is moderate. The pain has been constant since onset. She reports no foreign bodies present. The symptoms are aggravated by nothing.  Pt complains of pain to her left heel.  Pt cut on April 1.  Pt reports wound has popped open  Past Medical History  Diagnosis Date  . Esophageal stricture   . Chest pain 09/14/2009    no pulmonary embolus by chest CT  . Palpitations   . Elevated BP   . Anxiety     past Hx  . Depression     past Hx  . GERD (gastroesophageal reflux disease)     no meds    Past Surgical History  Procedure Date  . Cesarean section 2004  . Laparoscopy for ectopic pregnancy 2001  . Wisdom tooth extraction 2012    No family history on file.  History  Substance Use Topics  . Smoking status: Never Smoker   . Smokeless tobacco: Not on file  . Alcohol Use: Yes     social    OB History    Grav Para Term Preterm Abortions TAB SAB Ect Mult Living   4 2              Review of Systems  Skin: Positive for wound.  All other systems reviewed and are negative.    Allergies  Diphenhydramine hcl; Hydromorphone hcl; and Morphine  Home Medications   Current Outpatient Rx  Name Route Sig Dispense Refill  . CEPHALEXIN 500 MG PO CAPS Oral Take 1 capsule (500 mg total) by mouth 4 (four) times daily. 28 capsule 0  .  SULFAMETHOXAZOLE-TRIMETHOPRIM 800-160 MG PO TABS Oral Take 1 tablet by mouth every 12 (twelve) hours. 14 tablet 0    BP 140/76  Pulse 90  Temp(Src) 98.4 F (36.9 C) (Oral)  Resp 16  SpO2 99%  LMP 03/13/2012  Physical Exam  Nursing note and vitals reviewed. Constitutional: She is oriented to person, place, and time. She appears well-developed and well-nourished.  HENT:  Head: Normocephalic and atraumatic.  Eyes: Conjunctivae are normal. Pupils are equal, round, and reactive to light.  Musculoskeletal: She exhibits tenderness.       Swollen left heel.  Laceration red, swollen,  draining  Neurological: She is alert and oriented to person, place, and time. She has normal reflexes.  Psychiatric: She has a normal mood and affect.    ED Course  Procedures (including critical care time)  Labs Reviewed - No data to display No results found.   1. Wound infection       MDM  Soak 20 minutes 4 times a day       Lonia Skinner [place], [place] 03/13/12 2154

## 2012-03-13 NOTE — ED Notes (Signed)
Pt injured her left heel on April 1st. Pt was seen here "they glued the cut and gave me a tetanus shot" pt reports worsening pain, swelling and discharge.

## 2012-03-14 ENCOUNTER — Telehealth: Payer: Self-pay | Admitting: Family

## 2012-03-14 NOTE — ED Provider Notes (Signed)
Medical screening examination/treatment/procedure(s) were performed by non-physician practitioner and as supervising physician I was immediately available for consultation/collaboration.  Dora Simeone, MD 03/14/12 2005 

## 2012-03-14 NOTE — Telephone Encounter (Signed)
Left message for patient to return my call.

## 2012-03-14 NOTE — Telephone Encounter (Signed)
Pls call pt and arrange a 1 week ED follow up apt.

## 2012-03-16 ENCOUNTER — Emergency Department (HOSPITAL_BASED_OUTPATIENT_CLINIC_OR_DEPARTMENT_OTHER)
Admission: EM | Admit: 2012-03-16 | Discharge: 2012-03-17 | Disposition: A | Discharge status: Home / Self Care | Payer: Self-pay | Attending: Emergency Medicine | Admitting: Emergency Medicine

## 2012-03-16 ENCOUNTER — Encounter (HOSPITAL_BASED_OUTPATIENT_CLINIC_OR_DEPARTMENT_OTHER): Payer: Self-pay

## 2012-03-16 DIAGNOSIS — Z09 Encounter for follow-up examination after completed treatment for conditions other than malignant neoplasm: Secondary | ICD-10-CM | POA: Insufficient documentation

## 2012-03-16 DIAGNOSIS — Z5189 Encounter for other specified aftercare: Secondary | ICD-10-CM

## 2012-03-16 NOTE — ED Notes (Addendum)
Pt states that she is here for a wound check to wound on L heel suffered on 4/1.  Pt states that she has been taking her abx as prescribed and is noticing that wound is taking long to heal than expected.  C/o L leg tingling/numbness

## 2012-03-17 MED ORDER — TRAMADOL HCL 50 MG PO TABS: 50.0000 mg | ORAL_TABLET | Freq: Four times a day (QID) | ORAL | Status: AC | PRN

## 2012-03-17 MED ORDER — TRAMADOL HCL 50 MG PO TABS
50.0000 mg | ORAL_TABLET | Freq: Once | ORAL | Status: AC
  Administered 2012-03-17: 50 mg via ORAL
  Filled 2012-03-17: qty 1

## 2012-03-17 NOTE — ED Provider Notes (Signed)
History     CSN: [id number]  Arrival date & time 03/16/12  2137   First MD Initiated Contact with Patient 03/16/12 2358      Chief Complaint  Patient presents with  . Wound Check    (Consider location/radiation/quality/duration/timing/severity/associated sxs/prior treatment) HPI Comments: Presents for concern do to slow wound healing. Also had some lower extremity pain around the site.  Patient is a 41 y.o. female presenting with wound check. The history is provided by the patient. No language interpreter was used.  Wound Check  She was treated in the ED 10 to 14 days ago. Previous treatment in the ED includes laceration repair. Treatments since wound repair include oral antibiotics and a wound recheck. There has been no drainage from the wound. The redness has improved. There is no swelling present. The pain has not changed. She has no difficulty moving the affected extremity or digit.    Past Medical History  Diagnosis Date  . Esophageal stricture   . Chest pain 09/14/2009    no pulmonary embolus by chest CT  . Palpitations   . Elevated BP   . Anxiety     past Hx  . Depression     past Hx  . GERD (gastroesophageal reflux disease)     no meds    Past Surgical History  Procedure Date  . Cesarean section 2004  . Laparoscopy for ectopic pregnancy 2001  . Wisdom tooth extraction 2012    History reviewed. No pertinent family history.  History  Substance Use Topics  . Smoking status: Never Smoker   . Smokeless tobacco: Not on file  . Alcohol Use: Yes     social    OB History    Grav Para Term Preterm Abortions TAB SAB Ect Mult Living   4 2              Review of Systems  Constitutional: Negative for fever, activity change, appetite change and fatigue.  HENT: Negative for congestion, sore throat, rhinorrhea, neck pain and neck stiffness.   Respiratory: Negative for cough and shortness of breath.   Cardiovascular: Negative for chest pain and palpitations.    Gastrointestinal: Negative for nausea, vomiting and abdominal pain.  Genitourinary: Negative for dysuria, urgency, frequency and flank pain.  Musculoskeletal: Negative for myalgias, back pain and arthralgias.  Skin: Positive for wound. Negative for rash.  Neurological: Negative for dizziness, weakness, light-headedness, numbness and headaches.  All other systems reviewed and are negative.    Allergies  Diphenhydramine hcl; Hydromorphone hcl; and Morphine  Home Medications   Current Outpatient Rx  Name Route Sig Dispense Refill  . CEPHALEXIN 500 MG PO CAPS Oral Take 1 capsule (500 mg total) by mouth 4 (four) times daily. 28 capsule 0  . SULFAMETHOXAZOLE-TRIMETHOPRIM 800-160 MG PO TABS Oral Take 1 tablet by mouth every 12 (twelve) hours. 14 tablet 0  . TRAMADOL HCL 50 MG PO TABS Oral Take 1 tablet (50 mg total) by mouth every 6 (six) hours as needed for pain. 15 tablet 0    BP 138/79  Pulse 85  Temp(Src) 98.6 F (37 C) (Oral)  Resp 20  Ht 5\' 6"  (1.676 m)  Wt 198 lb (89.812 kg)  BMI 31.96 kg/m2  SpO2 100%  LMP 03/13/2012  Physical Exam  Nursing note and vitals reviewed. Constitutional: She is oriented to person, place, and time. She appears well-developed and well-nourished. No distress.  HENT:  Head: Normocephalic and atraumatic.  Mouth/Throat: Oropharynx is clear and moist.  No oropharyngeal exudate.  Eyes: Conjunctivae and EOM are normal. Pupils are equal, round, and reactive to light.  Neck: Normal range of motion. Neck supple.  Cardiovascular: Normal rate, regular rhythm, normal heart sounds and intact distal pulses.  Exam reveals no gallop and no friction rub.   No murmur heard. Pulmonary/Chest: Effort normal and breath sounds normal. No respiratory distress. She exhibits no tenderness.  Abdominal: Soft. Bowel sounds are normal. There is no tenderness.  Musculoskeletal: Normal range of motion. She exhibits no edema and no tenderness.  Neurological: She is alert and  oriented to person, place, and time. No cranial nerve deficit.  Skin: Skin is warm and dry.       Small V-shaped wound to the left posterior ankle. It is healing. No oozing from the site.    ED Course  Procedures (including critical care time)   Labs Reviewed  GLUCOSE, CAPILLARY   No results found.   1. Visit for wound check       MDM  Patient with a healing wound to her left posterior ankle just medial to the Achilles tendon. There is no tendon involvement. She is on antibiotics for a mild wound infection. She was encouraged to continue taking the antibiotics. I encouraged her her to remove her shoes immediately upon returning from work. I also encouraged her to wash the area with soap and water 3 times daily. I explained that these types of wounds may take up to a few weeks to heal fully. Instructed to followup with her primary care physician. Blood sugar is normal. No swelling to lower extremities to suggest spread of infection or evolution of a DVT.        Dayton Bailiff, MD 03/17/12 [phone]

## 2012-03-17 NOTE — Discharge Instructions (Signed)
Wound Care Wound care helps prevent pain and infection.  You may need a tetanus shot if:  You cannot remember when you had your last tetanus shot.   You have never had a tetanus shot.   The injury broke your skin.  If you need a tetanus shot and you choose not to have one, you may get tetanus. Sickness from tetanus can be serious. HOME CARE   Only take medicine as told by your doctor.   Clean the wound daily with mild soap and water.   Change any bandages (dressings) as told by your doctor.   Put medicated cream and a bandage on the wound as told by your doctor.   Change the bandage if it gets wet, dirty, or starts to smell.   Take showers. Do not take baths, swim, or do anything that puts your wound under water.   Rest and raise (elevate) the wound until the pain and puffiness (swelling) are better.   Keep all doctor visits as told.  GET HELP RIGHT AWAY IF:   Yellowish-white fluid (pus) comes from the wound.   Medicine does not lessen your pain.   There is a red streak going away from the wound.   You cannot move your finger or toe.   You have a fever.  MAKE SURE YOU:   Understand these instructions.   Will watch your condition.   Will get help right away if you are not doing well or get worse.  Document Released: 08/23/2008 Document Revised: 11/03/2011 Document Reviewed: 03/20/2011 Exit[employer] Patient Information 2012 [employer]. 

## 2012-03-19 NOTE — Telephone Encounter (Signed)
Patient made appointment for 03/21/12

## 2012-03-21 ENCOUNTER — Ambulatory Visit: Payer: Self-pay | Admitting: Family

## 2012-03-21 DIAGNOSIS — Z0289 Encounter for other administrative examinations: Secondary | ICD-10-CM

## 2012-05-01 ENCOUNTER — Ambulatory Visit (HOSPITAL_BASED_OUTPATIENT_CLINIC_OR_DEPARTMENT_OTHER)
Admit: 2012-05-01 | Discharge: 2012-05-01 | Disposition: A | Discharge status: Home / Self Care | Payer: Self-pay | Attending: Emergency Medicine | Admitting: Emergency Medicine

## 2012-05-01 ENCOUNTER — Emergency Department (HOSPITAL_BASED_OUTPATIENT_CLINIC_OR_DEPARTMENT_OTHER)
Admission: EM | Admit: 2012-05-01 | Discharge: 2012-05-01 | Disposition: A | Discharge status: Home / Self Care | Payer: Self-pay | Attending: Emergency Medicine | Admitting: Emergency Medicine

## 2012-05-01 ENCOUNTER — Encounter (HOSPITAL_BASED_OUTPATIENT_CLINIC_OR_DEPARTMENT_OTHER): Payer: Self-pay

## 2012-05-01 ENCOUNTER — Emergency Department (HOSPITAL_BASED_OUTPATIENT_CLINIC_OR_DEPARTMENT_OTHER): Payer: Self-pay

## 2012-05-01 DIAGNOSIS — R52 Pain, unspecified: Secondary | ICD-10-CM

## 2012-05-01 DIAGNOSIS — M79609 Pain in unspecified limb: Secondary | ICD-10-CM | POA: Insufficient documentation

## 2012-05-01 DIAGNOSIS — F341 Dysthymic disorder: Secondary | ICD-10-CM | POA: Insufficient documentation

## 2012-05-01 DIAGNOSIS — K219 Gastro-esophageal reflux disease without esophagitis: Secondary | ICD-10-CM | POA: Insufficient documentation

## 2012-05-01 DIAGNOSIS — M25562 Pain in left knee: Secondary | ICD-10-CM

## 2012-05-01 LAB — D-DIMER, QUANTITATIVE: D-Dimer, Quant: 0.96 ug/mL-FEU — ABNORMAL HIGH (ref 0.00–0.48)

## 2012-05-01 MED ORDER — ENOXAPARIN SODIUM 100 MG/ML ~~LOC~~ SOLN
90.0000 mg | Freq: Once | SUBCUTANEOUS | Status: AC
  Administered 2012-05-01: 90 mg via SUBCUTANEOUS
  Filled 2012-05-01: qty 1

## 2012-05-01 MED ORDER — NAPROXEN 250 MG PO TABS
500.0000 mg | ORAL_TABLET | Freq: Once | ORAL | Status: AC
Start: 2012-05-01 — End: 2012-05-01
  Administered 2012-05-01: 500 mg via ORAL
  Filled 2012-05-01: qty 2

## 2012-05-01 NOTE — ED Provider Notes (Signed)
History     CSN: [id number]  Arrival date & time 05/01/12  0343   First MD Initiated Contact with Patient 05/01/12 [phone]      Chief Complaint  Patient presents with  . Leg Pain    (Consider location/radiation/quality/duration/timing/severity/associated sxs/prior treatment) HPI This is a 41 year old black female who cut her left heel on April 1 of this year. The wound subsequently became infected and was treated with antibiotics. She has had chronic pain in her left lower leg since then. The pain became acutely worse over the last 1-2 days, becoming severe this morning. The pain is primarily along the anterior tibia but radiates around the lower leg. It is a deep achy pain. It is worse with palpation or ambulation. There is no associated edema. There is no associated erythema. She was anxious on arrival but states she has calm down since being here. She denies acute injury.  Past Medical History  Diagnosis Date  . Esophageal stricture   . Chest pain 09/14/2009    no pulmonary embolus by chest CT  . Palpitations   . Elevated BP   . Anxiety     past Hx  . Depression     past Hx  . GERD (gastroesophageal reflux disease)     no meds    Past Surgical History  Procedure Date  . Cesarean section 2004  . Laparoscopy for ectopic pregnancy 2001  . Wisdom tooth extraction 2012    History reviewed. No pertinent family history.  History  Substance Use Topics  . Smoking status: Never Smoker   . Smokeless tobacco: Not on file  . Alcohol Use: Yes     social    OB History    Grav Para Term Preterm Abortions TAB SAB Ect Mult Living   4 2              Review of Systems  All other systems reviewed and are negative.    Allergies  Diphenhydramine hcl; Hydromorphone hcl; and Morphine  Home Medications  No current outpatient prescriptions on file.  BP 127/80  Pulse 95  Temp(Src) 98.7 F (37.1 C) (Oral)  Resp 20  SpO2 95%  Physical Exam General: Well-developed,  well-nourished female in no acute distress; appearance consistent with age of record HENT: normocephalic, atraumatic Eyes: pupils equal round and reactive to light; extraocular muscles intact Neck: supple Heart: regular rate and rhythm Lungs: clear to auscultation bilaterally Abdomen: soft; nondistended Extremities: No deformity; full range of motion; pulses normal; well healed wound left heel without evidence of acute infection; generalized tenderness of left lower leg, primarily over anterior tibia without deformity; no erythema, swelling or warmth of left lower leg Neurologic: Awake, alert and oriented; motor function intact in all extremities and symmetric; no facial droop Skin: Warm and dry Psychiatric: anxious    ED Course  Procedures (including critical care time)     MDM   Nursing notes and vitals signs, including pulse oximetry, reviewed.  Summary of this visit's results, reviewed by myself:  Labs:  Results for orders placed during the hospital encounter of 05/01/12  D-DIMER, QUANTITATIVE      Component Value Range   D-Dimer, Quant 0.96 (*) 0.00 - 0.48 (ug/mL-FEU)    Imaging Studies: Dg Tibia/fibula Left  05/01/2012  *RADIOLOGY REPORT*  Clinical Data: Left leg pain.  LEFT TIBIA AND FIBULA - 2 VIEW  Comparison: None  Findings: The knee and ankle joints are maintained.  No acute bony findings involving the tibia or  fibula.  IMPRESSION: No acute bony findings.  Original Report Authenticated By: P. Loralie Champagne, M.D.   4:43 AM Suspect shin splints do to disturbed gait from prior injury. In light of elevated d-dimer we will have her return for ultrasound. She will followup with her primary care physician for further treatment. She was advised to take naproxen sodium 440 mg twice daily as needed for pain. She does not wish any narcotic pain medication. She does not wish any crutches.        Hanley Seamen, MD 05/01/12 [phone]

## 2012-05-01 NOTE — ED Notes (Signed)
Called pt with [place] results. No answer on cell phone, left message to return call.

## 2012-05-01 NOTE — ED Notes (Signed)
Left foot pain old lac on 02/27/12 none healing ,painful 10/10 woke up out of sleep

## 2012-05-22 ENCOUNTER — Encounter (HOSPITAL_BASED_OUTPATIENT_CLINIC_OR_DEPARTMENT_OTHER): Payer: Self-pay | Admitting: *Deleted

## 2012-05-22 ENCOUNTER — Emergency Department (HOSPITAL_BASED_OUTPATIENT_CLINIC_OR_DEPARTMENT_OTHER)
Admission: EM | Admit: 2012-05-22 | Discharge: 2012-05-23 | Disposition: A | Discharge status: Home / Self Care | Payer: Self-pay | Attending: Emergency Medicine | Admitting: Emergency Medicine

## 2012-05-22 ENCOUNTER — Emergency Department (HOSPITAL_BASED_OUTPATIENT_CLINIC_OR_DEPARTMENT_OTHER): Payer: Self-pay

## 2012-05-22 DIAGNOSIS — R110 Nausea: Secondary | ICD-10-CM | POA: Insufficient documentation

## 2012-05-22 DIAGNOSIS — F341 Dysthymic disorder: Secondary | ICD-10-CM | POA: Insufficient documentation

## 2012-05-22 DIAGNOSIS — K219 Gastro-esophageal reflux disease without esophagitis: Secondary | ICD-10-CM | POA: Insufficient documentation

## 2012-05-22 DIAGNOSIS — R109 Unspecified abdominal pain: Secondary | ICD-10-CM | POA: Insufficient documentation

## 2012-05-22 LAB — COMPREHENSIVE METABOLIC PANEL
AST: 13 U/L (ref 0–37)
Albumin: 3.6 g/dL (ref 3.5–5.2)
Alkaline Phosphatase: 61 U/L (ref 39–117)
Chloride: 102 mEq/L (ref 96–112)
Creatinine, Ser: 0.7 mg/dL (ref 0.50–1.10)
Potassium: 3.5 mEq/L (ref 3.5–5.1)
Total Bilirubin: 0.3 mg/dL (ref 0.3–1.2)
Total Protein: 7.8 g/dL (ref 6.0–8.3)

## 2012-05-22 LAB — DIFFERENTIAL
Basophils Absolute: 0 10*3/uL (ref 0.0–0.1)
Basophils Relative: 0 % (ref 0–1)
Neutro Abs: 4.9 10*3/uL (ref 1.7–7.7)
Neutrophils Relative %: 57 % (ref 43–77)

## 2012-05-22 LAB — URINALYSIS, ROUTINE W REFLEX MICROSCOPIC
Nitrite: NEGATIVE
Specific Gravity, Urine: 1.024 (ref 1.005–1.030)
pH: 7 (ref 5.0–8.0)

## 2012-05-22 LAB — URINE MICROSCOPIC-ADD ON

## 2012-05-22 LAB — CBC
MCHC: 35.8 g/dL (ref 30.0–36.0)
RDW: 12.7 % (ref 11.5–15.5)

## 2012-05-22 MED ORDER — IOHEXOL 300 MG/ML  SOLN
100.0000 mL | Freq: Once | INTRAMUSCULAR | Status: AC | PRN
  Administered 2012-05-22: 100 mL via INTRAVENOUS

## 2012-05-22 MED ORDER — FENTANYL CITRATE 0.05 MG/ML IJ SOLN
50.0000 ug | Freq: Once | INTRAMUSCULAR | Status: AC
  Administered 2012-05-22: 50 ug via INTRAVENOUS
  Filled 2012-05-22: qty 2

## 2012-05-22 MED ORDER — SODIUM CHLORIDE 0.9 % IV BOLUS (SEPSIS)
1000.0000 mL | Freq: Once | INTRAVENOUS | Status: AC
  Administered 2012-05-22: 1000 mL via INTRAVENOUS

## 2012-05-22 MED ORDER — IOHEXOL 300 MG/ML  SOLN
20.0000 mL | Freq: Once | INTRAMUSCULAR | Status: AC | PRN
  Administered 2012-05-22: 20 mL via ORAL

## 2012-05-22 MED ORDER — ONDANSETRON HCL 4 MG/2ML IJ SOLN
4.0000 mg | Freq: Once | INTRAMUSCULAR | Status: AC
  Administered 2012-05-22: 4 mg via INTRAVENOUS
  Filled 2012-05-22: qty 2

## 2012-05-22 MED ORDER — MORPHINE SULFATE 4 MG/ML IJ SOLN
4.0000 mg | Freq: Once | INTRAMUSCULAR | Status: DC
  Filled 2012-05-22: qty 1

## 2012-05-22 NOTE — ED Notes (Signed)
Abdominal bloating and cramping. Nausea. Diarrhea.

## 2012-05-22 NOTE — ED Provider Notes (Addendum)
History     CSN: [id number]  Arrival date & time 05/22/12  2037   First MD Initiated Contact with Patient 05/22/12 2201      Chief Complaint  Patient presents with  . Abdominal Pain    (Consider location/radiation/quality/duration/timing/severity/associated sxs/prior treatment) Patient is a 41 y.o. female presenting with abdominal pain. The history is provided by the patient.  Abdominal Pain The primary symptoms of the illness include abdominal pain and nausea. The primary symptoms of the illness do not include fever, vomiting, diarrhea, dysuria, vaginal discharge or vaginal bleeding. The current episode started 2 days ago. The onset of the illness was gradual. The problem has been gradually worsening.  The abdominal pain is located in the periumbilical region. The abdominal pain does not radiate. The severity of the abdominal pain is 5/10. The abdominal pain is relieved by nothing. The abdominal pain is exacerbated by eating.  Additional symptoms associated with the illness include anorexia. Symptoms associated with the illness do not include chills. Associated symptoms comments: bloating. Significant associated medical issues include PUD and GERD.    Past Medical History  Diagnosis Date  . Esophageal stricture   . Chest pain 09/14/2009    no pulmonary embolus by chest CT  . Palpitations   . Elevated BP   . Anxiety     past Hx  . Depression     past Hx  . GERD (gastroesophageal reflux disease)     no meds    Past Surgical History  Procedure Date  . Cesarean section 2004  . Laparoscopy for ectopic pregnancy 2001  . Wisdom tooth extraction 2012    No family history on file.  History  Substance Use Topics  . Smoking status: Never Smoker   . Smokeless tobacco: Not on file  . Alcohol Use: Yes     social    OB History    Grav Para Term Preterm Abortions TAB SAB Ect Mult Living   4 2              Review of Systems  Constitutional: Negative for fever and chills.    Gastrointestinal: Positive for nausea, abdominal pain and anorexia. Negative for vomiting and diarrhea.  Genitourinary: Negative for dysuria, vaginal bleeding and vaginal discharge.  All other systems reviewed and are negative.    Allergies  Diphenhydramine hcl; Hydromorphone hcl; and Morphine  Home Medications  No current outpatient prescriptions on file.  BP 108/68  Pulse 86  Temp 98.3 F (36.8 C) (Oral)  Resp 20  SpO2 100%  LMP 05/08/2012  Physical Exam  Nursing note and vitals reviewed. Constitutional: She is oriented to person, place, and time. She appears well-developed and well-nourished. No distress.  HENT:  Head: Normocephalic and atraumatic.  Mouth/Throat: Oropharynx is clear and moist.  Eyes: Conjunctivae and EOM are normal. Pupils are equal, round, and reactive to light.  Neck: Normal range of motion. Neck supple.  Cardiovascular: Normal rate, regular rhythm and intact distal pulses.   No murmur heard. Pulmonary/Chest: Effort normal and breath sounds normal. No respiratory distress. She has no wheezes. She has no rales.  Abdominal: Soft. Normal appearance. She exhibits no distension. There is tenderness in the periumbilical area. There is no rebound and no guarding.  Musculoskeletal: Normal range of motion. She exhibits no edema and no tenderness.  Neurological: She is alert and oriented to person, place, and time.  Skin: Skin is warm and dry. No rash noted. No erythema.  Psychiatric: She has a normal mood  and affect. Her behavior is normal.    ED Course  Procedures (including critical care time)  Labs Reviewed  URINALYSIS, ROUTINE W REFLEX MICROSCOPIC - Abnormal; Notable for the following:    APPearance CLOUDY (*)     Leukocytes, UA TRACE (*)     All other components within normal limits  URINE MICROSCOPIC-ADD ON - Abnormal; Notable for the following:    Squamous Epithelial / LPF FEW (*)     All other components within normal limits  CBC - Abnormal;  Notable for the following:    HCT 35.5 (*)     All other components within normal limits  PREGNANCY, URINE  DIFFERENTIAL  COMPREHENSIVE METABOLIC PANEL  LIPASE, BLOOD   Ct Abdomen Pelvis W Contrast  05/22/2012  *RADIOLOGY REPORT*  Clinical Data: Mid abdominal pain and nausea.  CT ABDOMEN AND PELVIS WITH CONTRAST  Technique:  Multidetector CT imaging of the abdomen and pelvis was performed following the standard protocol during bolus administration of intravenous contrast.  Contrast: 20mL OMNIPAQUE IOHEXOL 300 MG/ML  SOLN, OMNIPAQUE IOHEXOL 300 MG/ML  SOLN  Comparison: 09/18/2009  Findings: Slight fibrosis in the left lung base.  The liver, spleen, gallbladder, pancreas, adrenal glands, kidneys, abdominal aorta, and retroperitoneal lymph nodes are unremarkable. Flow is demonstrated in the portal and mesenteric vessels.  The stomach, small bowel, and colon are not abnormally distended.  No free air or free fluid in the abdomen.  Pelvis:  The uterus and adnexal structures are not enlarged. Stable appearance of focal enhancing nodule in the uterine fundus consistent with a leiomyoma and measuring about 3.5 cm diameter. No bladder wall thickening.  No free or loculated pelvic fluid collections.  The appendix is normal.  No evidence of diverticulitis.  No significant lymphadenopathy in the pelvis. Normal alignment of the lumbar vertebrae.  IMPRESSION: No acute process demonstrated in the abdomen or pelvis.  Original Report Authenticated By: Marlon Pel, M.D.     1. Abdominal  pain, other specified site       MDM   Patient with periumbilical abdominal pain which has been worsening since Friday. She's had nausea and abdominal bloating and cramping. She denies any vomiting, fever or diarrhea. One loose stool today after drinking milk but she is lactose intolerant. On exam she has guarding without rebound in the periumbilical region. She has no hernias palpated and she has no GU symptoms such  as dysuria or vaginal discharge. UA is negative for infection. CBC, CMP, lipase, UPT all within normal limits. CT pending for further eval.  Labs and CT neg.  Discussing with pt she has been off her PPI.  Will restart and give bentyl.  Will have f/u with PCP.        Gwyneth Sprout, MD 05/23/12 0000  Gwyneth Sprout, MD 06/14/12 0000

## 2012-05-23 MED ORDER — DICYCLOMINE HCL 20 MG PO TABS: 20.0000 mg | ORAL_TABLET | Freq: Two times a day (BID) | ORAL | Status: DC

## 2012-05-23 MED ORDER — PROMETHAZINE HCL 25 MG PO TABS: 25.0000 mg | ORAL_TABLET | Freq: Four times a day (QID) | ORAL | Status: DC | PRN

## 2012-05-23 MED ORDER — DEXLANSOPRAZOLE 30 MG PO CPDR: 30.0000 mg | DELAYED_RELEASE_CAPSULE | Freq: Every day | ORAL | Status: AC

## 2012-05-23 NOTE — Discharge Instructions (Signed)
Abdominal Pain (Nonspecific)  Your exam might not show the exact reason you have abdominal pain. Since there are many different causes of abdominal pain, another checkup and more tests may be needed. It is very important to follow up for lasting (persistent) or worsening symptoms. A possible cause of abdominal pain in any person who still has his or her appendix is acute appendicitis. Appendicitis is often hard to diagnose. Normal blood tests, urine tests, ultrasound, and CT scans do not completely rule out early appendicitis or other causes of abdominal pain. Sometimes, only the changes that happen over time will allow appendicitis and other causes of abdominal pain to be determined. Other potential problems that may require surgery may also take time to become more apparent. Because of this, it is important that you follow all of the instructions below.  HOME CARE INSTRUCTIONS    Rest as much as possible.   Do not eat solid food until your pain is gone.   While adults or children have pain: A diet of water, weak decaffeinated tea, broth or bouillon, gelatin, oral rehydration solutions (ORS), frozen ice pops, or ice chips may be helpful.   When pain is gone in adults or children: Start a light diet (dry toast, crackers, applesauce, or white rice). Increase the diet slowly as long as it does not bother you. Eat no dairy products (including cheese and eggs) and no spicy, fatty, fried, or high-fiber foods.   Use no alcohol, caffeine, or cigarettes.   Take your regular medicines unless your caregiver told you not to.   Take any prescribed medicine as directed.   Only take over-the-counter or prescription medicines for pain, discomfort, or fever as directed by your caregiver. Do not give aspirin to children.  If your caregiver has given you a follow-up appointment, it is very important to keep that appointment. Not keeping the appointment could result in a permanent injury and/or lasting (chronic) pain and/or  disability. If there is any problem keeping the appointment, you must call to reschedule.   SEEK IMMEDIATE MEDICAL CARE IF:    Your pain is not gone in 24 hours.   Your pain becomes worse, changes location, or feels different.   You or your child has an oral temperature above 102 F (38.9 C), not controlled by medicine.   Your baby is older than 3 months with a rectal temperature of 102 F (38.9 C) or higher.   Your baby is 3 months old or younger with a rectal temperature of 100.4 F (38 C) or higher.   You have shaking chills.   You keep throwing up (vomiting) or cannot drink liquids.   There is blood in your vomit or you see blood in your bowel movements.   Your bowel movements become dark or black.   You have frequent bowel movements.   Your bowel movements stop (become blocked) or you cannot pass gas.   You have bloody, frequent, or painful urination.   You have yellow discoloration in the skin or whites of the eyes.   Your stomach becomes bloated or bigger.   You have dizziness or fainting.   You have chest or back pain.  MAKE SURE YOU:    Understand these instructions.   Will watch your condition.   Will get help right away if you are not doing well or get worse.  Document Released: 11/14/2005 Document Revised: 11/03/2011 Document Reviewed: 10/12/2009  [employer] Patient Information 2012 [employer].

## 2012-09-02 ENCOUNTER — Emergency Department (HOSPITAL_BASED_OUTPATIENT_CLINIC_OR_DEPARTMENT_OTHER)
Admission: EM | Admit: 2012-09-02 | Discharge: 2012-09-02 | Disposition: A | Discharge status: Home / Self Care | Payer: Self-pay | Attending: Emergency Medicine | Admitting: Emergency Medicine

## 2012-09-02 ENCOUNTER — Emergency Department (HOSPITAL_BASED_OUTPATIENT_CLINIC_OR_DEPARTMENT_OTHER): Payer: Self-pay

## 2012-09-02 ENCOUNTER — Telehealth: Payer: Self-pay | Admitting: Family

## 2012-09-02 ENCOUNTER — Encounter (HOSPITAL_BASED_OUTPATIENT_CLINIC_OR_DEPARTMENT_OTHER): Payer: Self-pay | Admitting: Emergency Medicine

## 2012-09-02 DIAGNOSIS — F419 Anxiety disorder, unspecified: Secondary | ICD-10-CM

## 2012-09-02 DIAGNOSIS — F411 Generalized anxiety disorder: Secondary | ICD-10-CM | POA: Insufficient documentation

## 2012-09-02 DIAGNOSIS — R110 Nausea: Secondary | ICD-10-CM | POA: Insufficient documentation

## 2012-09-02 DIAGNOSIS — R0602 Shortness of breath: Secondary | ICD-10-CM | POA: Insufficient documentation

## 2012-09-02 DIAGNOSIS — R079 Chest pain, unspecified: Secondary | ICD-10-CM | POA: Insufficient documentation

## 2012-09-02 DIAGNOSIS — R0789 Other chest pain: Secondary | ICD-10-CM

## 2012-09-02 LAB — COMPREHENSIVE METABOLIC PANEL
AST: 12 U/L (ref 0–37)
BUN: 7 mg/dL (ref 6–23)
CO2: 28 mEq/L (ref 19–32)
Calcium: 8.9 mg/dL (ref 8.4–10.5)
Chloride: 103 mEq/L (ref 96–112)
Creatinine, Ser: 0.7 mg/dL (ref 0.50–1.10)
GFR calc Af Amer: >90 mL/min (ref >90)
GFR calc non Af Amer: >90 mL/min (ref >90)
Glucose, Bld: 90 mg/dL (ref 70–99)
Total Bilirubin: 0.4 mg/dL (ref 0.3–1.2)

## 2012-09-02 LAB — CBC WITH DIFFERENTIAL/PLATELET
Basophils Absolute: 0 10*3/uL (ref 0.0–0.1)
Eosinophils Relative: 1 % (ref 0–5)
HCT: 35.6 % — ABNORMAL LOW (ref 36.0–46.0)
Hemoglobin: 12.8 g/dL (ref 12.0–15.0)
Lymphocytes Relative: 35 % (ref 12–46)
MCV: 81.5 fL (ref 78.0–100.0)
Monocytes Absolute: 0.6 10*3/uL (ref 0.1–1.0)
Monocytes Relative: 10 % (ref 3–12)
RDW: 12.9 % (ref 11.5–15.5)
WBC: 6 10*3/uL (ref 4.0–10.5)

## 2012-09-02 LAB — URINALYSIS, ROUTINE W REFLEX MICROSCOPIC
Leukocytes, UA: NEGATIVE
Nitrite: NEGATIVE
Protein, ur: NEGATIVE mg/dL
Specific Gravity, Urine: 1.014 (ref 1.005–1.030)
Urobilinogen, UA: 1 mg/dL (ref 0.0–1.0)

## 2012-09-02 LAB — PREGNANCY, URINE: Preg Test, Ur: NEGATIVE

## 2012-09-02 MED ORDER — ONDANSETRON HCL 4 MG PO TABS: 4.0000 mg | ORAL_TABLET | Freq: Four times a day (QID) | ORAL | Status: DC

## 2012-09-02 MED ORDER — SODIUM CHLORIDE 0.9 % IV SOLN
Freq: Once | INTRAVENOUS | Status: AC
  Administered 2012-09-02: 13:00:00 via INTRAVENOUS

## 2012-09-02 MED ORDER — ONDANSETRON HCL 4 MG/2ML IJ SOLN
4.0000 mg | Freq: Once | INTRAMUSCULAR | Status: AC
  Administered 2012-09-02: 4 mg via INTRAVENOUS
  Filled 2012-09-02: qty 2

## 2012-09-02 MED ORDER — NITROGLYCERIN 0.4 MG SL SUBL
0.4000 mg | SUBLINGUAL_TABLET | SUBLINGUAL | Status: DC | PRN
  Administered 2012-09-02: 0.4 mg via SUBLINGUAL
  Filled 2012-09-02: qty 25

## 2012-09-02 MED ORDER — ALPRAZOLAM 0.25 MG PO TABS: 0.2500 mg | ORAL_TABLET | Freq: Every evening | ORAL | Status: DC | PRN

## 2012-09-02 NOTE — ED Provider Notes (Signed)
History     CSN: [id number]  Arrival date & time 09/02/12  1020   First MD Initiated Contact with Patient 09/02/12 1057      Chief Complaint  Patient presents with  . Chest Pain  . Nausea  . Headache    (Consider location/radiation/quality/duration/timing/severity/associated sxs/prior treatment) HPI Pt with a history of anxiety and esophageal diease awoke at 0300 today with non-radiating chest heaviness. + nausea. No fever chills, cough. Mild associated SOB. No lower ext swelling. Discomfort has been constant since. She has been see several times for the complaint of CP. She does not smoke, no HTN, no DM, no recent travel or immobilization, no FH of PE. Pt states she has been under increasing stress due to currently going through a divorce.  Past Medical History  Diagnosis Date  . Esophageal stricture   . Chest pain 09/14/2009    no pulmonary embolus by chest CT  . Palpitations   . Elevated BP   . Anxiety     past Hx  . Depression     past Hx  . GERD (gastroesophageal reflux disease)     no meds    Past Surgical History  Procedure Date  . Cesarean section 2004  . Laparoscopy for ectopic pregnancy 2001  . Wisdom tooth extraction 2012    No family history on file.  History  Substance Use Topics  . Smoking status: Never Smoker   . Smokeless tobacco: Not on file  . Alcohol Use: Yes     social    OB History    Grav Para Term Preterm Abortions TAB SAB Ect Mult Living   4 2              Review of Systems  Constitutional: Negative for fever and chills.  Respiratory: Positive for shortness of breath. Negative for cough, wheezing and stridor.   Cardiovascular: Positive for chest pain. Negative for palpitations and leg swelling.  Gastrointestinal: Positive for nausea. Negative for vomiting, abdominal pain, diarrhea and constipation.  Skin: Negative for rash and wound.  Neurological: Positive for headaches. Negative for dizziness, seizures, syncope, light-headedness  and numbness.  Psychiatric/Behavioral: The patient is nervous/anxious.     Allergies  Diphenhydramine hcl; Hydromorphone hcl; and Morphine  Home Medications   Current Outpatient Rx  Name Route Sig Dispense Refill  . ALPRAZOLAM 0.25 MG PO TABS Oral Take 1 tablet (0.25 mg total) by mouth at bedtime as needed for sleep. 30 tablet 0  . ONDANSETRON HCL 4 MG PO TABS Oral Take 1 tablet (4 mg total) by mouth every 6 (six) hours. 12 tablet 0    BP 106/71  Pulse 67  Temp 99.3 F (37.4 C) (Oral)  Resp 16  Ht 5\' 6"  (1.676 m)  Wt 190 lb (86.183 kg)  BMI 30.67 kg/m2  SpO2 100%  LMP 08/12/2012  Physical Exam  Nursing note and vitals reviewed. Constitutional: She is oriented to person, place, and time. She appears well-developed and well-nourished. No distress.       Anxious appearing  HENT:  Head: Normocephalic and atraumatic.  Mouth/Throat: Oropharynx is clear and moist.  Eyes: EOM are normal. Pupils are equal, round, and reactive to light.  Neck: Normal range of motion. Neck supple.  Cardiovascular: Normal rate and regular rhythm.  Exam reveals no gallop and no friction rub.   No murmur heard. Pulmonary/Chest: Effort normal and breath sounds normal. No respiratory distress. She has no wheezes. She has no rales. She exhibits tenderness (mild sternal  wall TTP).  Abdominal: Soft. Bowel sounds are normal. She exhibits no distension and no mass. There is no tenderness. There is no rebound and no guarding.  Musculoskeletal: Normal range of motion. She exhibits no edema and no tenderness.       No lower ext swelling or pain  Neurological: She is alert and oriented to person, place, and time.       5/5 motor in all ext, sensation intact  Skin: Skin is warm and dry. No rash noted. No erythema.  Psychiatric:       anxious    ED Course  Procedures (including critical care time)  Labs Reviewed  CBC WITH DIFFERENTIAL - Abnormal; Notable for the following:    HCT 35.6 (*)     All other  components within normal limits  COMPREHENSIVE METABOLIC PANEL - Abnormal; Notable for the following:    Albumin 3.4 (*)     All other components within normal limits  URINALYSIS, ROUTINE W REFLEX MICROSCOPIC  PREGNANCY, URINE  TROPONIN I   Dg Chest 2 View  09/02/2012  *RADIOLOGY REPORT*  Clinical Data: Mid chest heaviness with shortness of breath and nausea.  CHEST - 2 VIEW  Comparison: Radiographs 11/16/2011.  CT 06/26/2011.  Findings: The The heart size and mediastinal contours are normal. The lungs are clear. There is no pleural effusion or pneumothorax. No acute osseous findings are identified.  IMPRESSION: Stable examination.  No active cardiopulmonary process.   Original Report Authenticated By: Gerrianne Scale, M.D.      1. Atypical chest pain   2. Anxiety      Date: 09/02/2012  Rate: 63  Rhythm: normal sinus rhythm  QRS Axis: normal  Intervals: normal  ST/T Wave abnormalities: normal  Conduction Disutrbances:none  Narrative Interpretation:   Old EKG Reviewed: unchanged    MDM  Low concern for CAD. Reassuring EKG. Atypical presentation.   Pt states she is feeling better and is asking for something for anxiety. Given lack of risk factors, neg workup including roughly 12 hour trop and another more plausible explanation for pt's symptoms, will d/c home with xanax rx       Loren Racer, MD 09/02/12 1434

## 2012-09-02 NOTE — Telephone Encounter (Signed)
Pls call pt and arrange an ED follow up visit.

## 2012-09-02 NOTE — ED Notes (Signed)
Pt states she was asleep and woke up with chest heaviness, non-radiating, some nausea, some feeling of SOB.  No numbness/tingling.

## 2012-09-03 NOTE — Telephone Encounter (Signed)
Left detailed message informing patient to schedule a post ED visit.

## 2012-09-04 NOTE — Telephone Encounter (Signed)
Left message for patient to return my phone call.

## 2012-09-05 NOTE — Telephone Encounter (Signed)
Left message for patient to return my phone call.

## 2012-09-05 NOTE — Telephone Encounter (Signed)
We have been unsuccessful in reaching pt to schedule ED f/u.

## 2012-10-29 ENCOUNTER — Encounter: Payer: Self-pay | Admitting: Obstetrics and Gynecology

## 2013-01-04 ENCOUNTER — Encounter: Payer: Self-pay | Admitting: Family

## 2013-01-04 ENCOUNTER — Other Ambulatory Visit: Payer: Self-pay | Admitting: Family

## 2013-01-04 ENCOUNTER — Ambulatory Visit (INDEPENDENT_AMBULATORY_CARE_PROVIDER_SITE_OTHER): Payer: Self-pay | Admitting: Family

## 2013-01-04 VITALS — BP 130/80 | HR 82 | Temp 98.4°F | Resp 16 | Wt 201.0 lb

## 2013-01-04 DIAGNOSIS — Z1231 Encounter for screening mammogram for malignant neoplasm of breast: Secondary | ICD-10-CM

## 2013-01-04 DIAGNOSIS — N644 Mastodynia: Secondary | ICD-10-CM

## 2013-01-04 DIAGNOSIS — R0789 Other chest pain: Secondary | ICD-10-CM

## 2013-01-04 DIAGNOSIS — Z0000 Encounter for general adult medical examination without abnormal findings: Secondary | ICD-10-CM

## 2013-01-04 DIAGNOSIS — R030 Elevated blood-pressure reading, without diagnosis of hypertension: Secondary | ICD-10-CM

## 2013-01-04 MED ORDER — BENZOYL PEROXIDE-ERYTHROMYCIN 5-3 % EX GEL: Freq: Two times a day (BID) | CUTANEOUS | Status: DC

## 2013-01-04 NOTE — Patient Instructions (Addendum)
Please schedule your mammogram on the first floor.   Please schedule a follow up appointment in 3 months.

## 2013-01-04 NOTE — Progress Notes (Signed)
  Subjective:    Patient ID: Stacy [name], female    DOB: 1971-06-29, 42 y.o.   MRN: [id number]  HPI  Stacy [name] is a  42 yr old female who presents today for evaluation of her blood pressure. Had brief associated chest pain and nausea which is resolved.   R breast pain- reports that she has a "throbbing pain" sharp pains,  Denies redness/drainage, more tender menstrual cycle.      Review of Systems See HPI  Past Medical History  Diagnosis Date  . Esophageal stricture   . Chest pain 09/14/2009    no pulmonary embolus by chest CT  . Palpitations   . Elevated BP   . Anxiety     past Hx  . Depression     past Hx  . GERD (gastroesophageal reflux disease)     no meds    History   Social History  . Marital Status: Legally Separated    Spouse Name: N/A    Number of Children: 2  . Years of Education: N/A   Occupational History  .  [employer]   Social History Main Topics  . Smoking status: Never Smoker   . Smokeless tobacco: Not on file  . Alcohol Use: Yes     Comment: social  . Drug Use: No  . Sexually Active: No   Other Topics Concern  . Not on file   Social History Narrative  . No narrative on file    Past Surgical History  Procedure Date  . Cesarean section 2004  . Laparoscopy for ectopic pregnancy 2001  . Wisdom tooth extraction 2012    No family history on file.  Allergies  Allergen Reactions  . Diphenhydramine Hcl Nausea Only  . Hydromorphone Hcl Nausea And Vomiting  . Morphine Nausea And Vomiting    Current Outpatient Prescriptions on File Prior to Visit  Medication Sig Dispense Refill  . ALPRAZolam (XANAX) 0.25 MG tablet Take 1 tablet (0.25 mg total) by mouth at bedtime as needed for sleep.  30 tablet  0  . ondansetron (ZOFRAN) 4 MG tablet Take 1 tablet (4 mg total) by mouth every 6 (six) hours.  12 tablet  0    BP 130/80  Pulse 82  Temp 98.4 F (36.9 C) (Oral)  Resp 16  Wt 201 lb (91.173 kg)  SpO2 99%  LMP 01/01/2013        Objective:   Physical Exam  Constitutional: She is oriented to person, place, and time. She appears well-developed and well-nourished. No distress.  HENT:  Head: Normocephalic and atraumatic.  Cardiovascular: Normal rate and regular rhythm.   No murmur heard. Pulmonary/Chest: Effort normal and breath sounds normal. No respiratory distress. She has no wheezes. She has no rales. She exhibits no tenderness.  Genitourinary:  Breasts: Examined lying and sitting.  Right: Without masses, retractions, discharge or axillary adenopathy.  Left: Without masses, retractions, discharge or axillary adenopathy.    Musculoskeletal: She exhibits no edema.  Neurological: She is alert and oriented to person, place, and time.  Skin: Skin is warm and dry.  Psychiatric: She has a normal mood and affect. Her behavior is normal. Judgment and thought content normal.          Assessment & Plan:

## 2013-01-09 ENCOUNTER — Ambulatory Visit (HOSPITAL_BASED_OUTPATIENT_CLINIC_OR_DEPARTMENT_OTHER): Payer: Self-pay

## 2013-01-09 ENCOUNTER — Inpatient Hospital Stay (HOSPITAL_BASED_OUTPATIENT_CLINIC_OR_DEPARTMENT_OTHER): Admission: RE | Admit: 2013-01-09 | Payer: Self-pay | Source: Ambulatory Visit

## 2013-01-10 DIAGNOSIS — N644 Mastodynia: Secondary | ICD-10-CM | POA: Insufficient documentation

## 2013-01-10 NOTE — Assessment & Plan Note (Addendum)
bp looks ok today. Recommended low sodium diet.ekg is performed today and is normal.she describes very atypical brief episode of chest pain. Suspect anxiety. Low cardiac risk.

## 2013-01-10 NOTE — Assessment & Plan Note (Signed)
Normal breast exam. Obtain mammogram.

## 2013-01-18 ENCOUNTER — Ambulatory Visit (HOSPITAL_BASED_OUTPATIENT_CLINIC_OR_DEPARTMENT_OTHER)
Admission: RE | Admit: 2013-01-18 | Discharge: 2013-01-18 | Disposition: A | Discharge status: Home / Self Care | Payer: Self-pay | Source: Ambulatory Visit | Attending: Family | Admitting: Family

## 2013-01-18 DIAGNOSIS — Z1231 Encounter for screening mammogram for malignant neoplasm of breast: Secondary | ICD-10-CM | POA: Insufficient documentation

## 2013-02-01 ENCOUNTER — Ambulatory Visit: Payer: Self-pay | Admitting: Family

## 2013-02-04 ENCOUNTER — Encounter: Payer: Self-pay | Admitting: Family

## 2013-02-04 ENCOUNTER — Ambulatory Visit: Payer: Self-pay | Admitting: Family

## 2013-02-04 ENCOUNTER — Ambulatory Visit (INDEPENDENT_AMBULATORY_CARE_PROVIDER_SITE_OTHER): Payer: Self-pay | Admitting: Family

## 2013-02-04 VITALS — BP 118/70 | HR 79 | Temp 98.7°F | Resp 16

## 2013-02-04 DIAGNOSIS — N644 Mastodynia: Secondary | ICD-10-CM

## 2013-02-04 NOTE — Patient Instructions (Addendum)
Please follow up in 6 months. Sooner if problems or concerns.

## 2013-02-04 NOTE — Progress Notes (Signed)
  Subjective:    Patient ID: Stacy [name], female    DOB: 09-28-1971, 42 y.o.   MRN: [id number]  HPI  Ms. Stacy [name] is a 42 yr old female who presents today to discuss mammogram results. Mammogram notes no mammographic evidence of malignancy.  Results were compared to prior exam 08/06/18 and notes heterogeneously dense breast tissue.  She reports that she has had some tenderness in the right breast, but this tenderness resolved after our last visit until her menses when she noticed the pain again.     Review of Systems See HPI  Past Medical History  Diagnosis Date  . Esophageal stricture   . Chest pain 09/14/2009    no pulmonary embolus by chest CT  . Palpitations   . Elevated BP   . Anxiety     past Hx  . Depression     past Hx  . GERD (gastroesophageal reflux disease)     no meds    History   Social History  . Marital Status: Legally Separated    Spouse Name: N/A    Number of Children: 2  . Years of Education: N/A   Occupational History  .  [employer]   Social History Main Topics  . Smoking status: Never Smoker   . Smokeless tobacco: Not on file  . Alcohol Use: Yes     Comment: social  . Drug Use: No  . Sexually Active: No   Other Topics Concern  . Not on file   Social History Narrative  . No narrative on file    Past Surgical History  Procedure Laterality Date  . Cesarean section  2004  . Laparoscopy for ectopic pregnancy  2001  . Wisdom tooth extraction  2012    No family history on file.  Allergies  Allergen Reactions  . Diphenhydramine Hcl Nausea Only  . Hydromorphone Hcl Nausea And Vomiting  . Morphine Nausea And Vomiting    Current Outpatient Prescriptions on File Prior to Visit  Medication Sig Dispense Refill  . ALPRAZolam (XANAX) 0.25 MG tablet Take 1 tablet (0.25 mg total) by mouth at bedtime as needed for sleep.  30 tablet  0  . benzoyl peroxide-erythromycin (BENZAMYCIN) gel Apply topically 2 (two) times daily.  23.3 g  2  . ondansetron  (ZOFRAN) 4 MG tablet Take 1 tablet (4 mg total) by mouth every 6 (six) hours.  12 tablet  0   No current facility-administered medications on file prior to visit.    BP 118/70  Pulse 79  Temp(Src) 98.7 F (37.1 C) (Oral)  Resp 16  SpO2 98%       Objective:   Physical Exam  Constitutional: She is oriented to person, place, and time. She appears well-developed and well-nourished. No distress.  HENT:  Head: Normocephalic and atraumatic.  Cardiovascular:  Murmur heard. Pulmonary/Chest: Effort normal and breath sounds normal.  Neurological: She is alert and oriented to person, place, and time.  Psychiatric: She has a normal mood and affect. Her behavior is normal. Judgment and thought content normal.          Assessment & Plan:

## 2013-02-04 NOTE — Assessment & Plan Note (Addendum)
Mammogram is negative.  We discussed that adding a vitamin E supplement and avoiding caffeine may help with breast tenderness.   Reassurance provided. We discussed the importance of keeping up with annual mammograms. 10 minutes spent with pt today.  >50% of this time was spent counseling pt on her mammogram results and importance of preventative breast cancer screening.

## 2013-04-20 ENCOUNTER — Emergency Department (HOSPITAL_BASED_OUTPATIENT_CLINIC_OR_DEPARTMENT_OTHER): Payer: Self-pay

## 2013-04-20 ENCOUNTER — Encounter (HOSPITAL_BASED_OUTPATIENT_CLINIC_OR_DEPARTMENT_OTHER): Payer: Self-pay | Admitting: Emergency Medicine

## 2013-04-20 ENCOUNTER — Emergency Department (HOSPITAL_BASED_OUTPATIENT_CLINIC_OR_DEPARTMENT_OTHER)
Admission: EM | Admit: 2013-04-20 | Discharge: 2013-04-20 | Disposition: A | Discharge status: Home / Self Care | Payer: Self-pay | Attending: Emergency Medicine | Admitting: Emergency Medicine

## 2013-04-20 DIAGNOSIS — N644 Mastodynia: Secondary | ICD-10-CM | POA: Insufficient documentation

## 2013-04-20 DIAGNOSIS — Z8679 Personal history of other diseases of the circulatory system: Secondary | ICD-10-CM | POA: Insufficient documentation

## 2013-04-20 DIAGNOSIS — Z3202 Encounter for pregnancy test, result negative: Secondary | ICD-10-CM | POA: Insufficient documentation

## 2013-04-20 DIAGNOSIS — R209 Unspecified disturbances of skin sensation: Secondary | ICD-10-CM | POA: Insufficient documentation

## 2013-04-20 DIAGNOSIS — Z8719 Personal history of other diseases of the digestive system: Secondary | ICD-10-CM | POA: Insufficient documentation

## 2013-04-20 DIAGNOSIS — Z8659 Personal history of other mental and behavioral disorders: Secondary | ICD-10-CM | POA: Insufficient documentation

## 2013-04-20 LAB — PREGNANCY, URINE: Preg Test, Ur: NEGATIVE

## 2013-04-20 NOTE — ED Notes (Signed)
Right sided breast pain for a couple of days.  Pt denies feeling a lump or breast nipple drainage.

## 2013-04-20 NOTE — ED Provider Notes (Signed)
History     CSN: [id number]  Arrival date & time 04/20/13  4034   First MD Initiated Contact with Patient 04/20/13 1100      Chief Complaint  Patient presents with  . Breast Pain    (Consider location/radiation/quality/duration/timing/severity/associated sxs/prior treatment) HPI Comments: Patient presents with a 2 to three-day history of discomfort in her right breast. She states she she has throbbing pain and numbness to her right lateral breast area. She had a similar symptoms in April and had a mammogram which she says was normal. She denies any lumps or not. She denies any nipple drainage. She denies any left-sided breast pain. She has no pain to her neck or shoulder   Past Medical History  Diagnosis Date  . Esophageal stricture   . Chest pain 09/14/2009    no pulmonary embolus by chest CT  . Palpitations   . Elevated BP   . Anxiety     past Hx  . Depression     past Hx  . GERD (gastroesophageal reflux disease)     no meds    Past Surgical History  Procedure Laterality Date  . Cesarean section  2004  . Laparoscopy for ectopic pregnancy  2001  . Wisdom tooth extraction  2012    No family history on file.  History  Substance Use Topics  . Smoking status: Never Smoker   . Smokeless tobacco: Not on file  . Alcohol Use: Yes     Comment: social    OB History   Grav Para Term Preterm Abortions TAB SAB Ect Mult Living   4 2              Review of Systems  Constitutional: Negative for fever, chills, diaphoresis and fatigue.  HENT: Negative for congestion, rhinorrhea and sneezing.   Eyes: Negative.   Respiratory: Negative for cough, chest tightness and shortness of breath.   Cardiovascular: Negative for chest pain and leg swelling.  Gastrointestinal: Negative for nausea, vomiting, abdominal pain, diarrhea and blood in stool.  Genitourinary: Negative for frequency, hematuria, flank pain and difficulty urinating.  Musculoskeletal: Negative for back pain and  arthralgias.  Skin: Negative for rash.  Neurological: Negative for dizziness, speech difficulty, weakness, numbness and headaches.    Allergies  Diphenhydramine hcl; Hydromorphone hcl; and Morphine  Home Medications  No current outpatient prescriptions on file.  BP 114/81  Pulse 71  Temp(Src) 98.3 F (36.8 C) (Oral)  Resp 24  Ht 5\' 6"  (1.676 m)  Wt 198 lb (89.812 kg)  BMI 31.97 kg/m2  SpO2 99%  LMP 04/11/2013  Physical Exam  Constitutional: She is oriented to person, place, and time. She appears well-developed and well-nourished.  HENT:  Head: Normocephalic and atraumatic.  Eyes: Pupils are equal, round, and reactive to light.  Neck: Normal range of motion. Neck supple.  Cardiovascular: Normal rate, regular rhythm and normal heart sounds.   Pulmonary/Chest: Effort normal and breath sounds normal. No respiratory distress. She has no wheezes. She has no rales. She exhibits no tenderness.  Patient has some tenderness on palpation over the right breast along the pectoralis muscle. There's no swelling noted. No lumps or nausea noted. No erythema or warmth is noted. No drainage from the nipple is noted. No lymphadenopathy is noted.  Abdominal: Soft. Bowel sounds are normal. There is no tenderness. There is no rebound and no guarding.  Musculoskeletal: Normal range of motion. She exhibits no edema.  Lymphadenopathy:    She has no cervical adenopathy.  Neurological: She is alert and oriented to person, place, and time.  Skin: Skin is warm and dry. No rash noted.  Psychiatric: She has a normal mood and affect.    ED Course  Procedures (including critical care time)  Labs Reviewed  PREGNANCY, URINE   Dg Chest 2 View  04/20/2013   *RADIOLOGY REPORT*  Clinical Data: 2-day history of right-sided chest pain.  CHEST - 2 VIEW  Comparison: Two-view chest x-ray 09/02/2012, 11/16/2011.  Findings: Cardiomediastinal silhouette unremarkable.  Lungs clear. Bronchovascular markings normal.   Pulmonary vascularity normal.  No pleural effusions.  No pneumothorax.  Visualized bony thorax intact.  No significant interval change.  IMPRESSION: Normal and stable examination.   Original Report Authenticated By: Hulan Saas, M.D.     1. Breast pain in female       MDM  Patient with right-sided breast pain. I don't feel any nodules. She had a recent mammogram which was normal. I advised her to followup with her primary care physician for recheck in the tenderness continues she might need an ultrasound of the breast.     Rolan Bucco, MD 04/20/13 1154

## 2013-04-27 ENCOUNTER — Emergency Department (HOSPITAL_BASED_OUTPATIENT_CLINIC_OR_DEPARTMENT_OTHER)
Admission: EM | Admit: 2013-04-27 | Discharge: 2013-04-27 | Disposition: A | Discharge status: Home / Self Care | Payer: Self-pay | Attending: Emergency Medicine | Admitting: Emergency Medicine

## 2013-04-27 ENCOUNTER — Encounter (HOSPITAL_BASED_OUTPATIENT_CLINIC_OR_DEPARTMENT_OTHER): Payer: Self-pay

## 2013-04-27 DIAGNOSIS — Z8719 Personal history of other diseases of the digestive system: Secondary | ICD-10-CM | POA: Insufficient documentation

## 2013-04-27 DIAGNOSIS — R51 Headache: Secondary | ICD-10-CM | POA: Insufficient documentation

## 2013-04-27 DIAGNOSIS — Z8679 Personal history of other diseases of the circulatory system: Secondary | ICD-10-CM | POA: Insufficient documentation

## 2013-04-27 DIAGNOSIS — Z8659 Personal history of other mental and behavioral disorders: Secondary | ICD-10-CM | POA: Insufficient documentation

## 2013-04-27 DIAGNOSIS — R110 Nausea: Secondary | ICD-10-CM | POA: Insufficient documentation

## 2013-04-27 DIAGNOSIS — F411 Generalized anxiety disorder: Secondary | ICD-10-CM | POA: Insufficient documentation

## 2013-04-27 DIAGNOSIS — R079 Chest pain, unspecified: Secondary | ICD-10-CM | POA: Insufficient documentation

## 2013-04-27 DIAGNOSIS — R61 Generalized hyperhidrosis: Secondary | ICD-10-CM | POA: Insufficient documentation

## 2013-04-27 DIAGNOSIS — R6883 Chills (without fever): Secondary | ICD-10-CM | POA: Insufficient documentation

## 2013-04-27 LAB — CBC WITH DIFFERENTIAL/PLATELET
Basophils Absolute: 0 10*3/uL (ref 0.0–0.1)
Eosinophils Relative: 2 % (ref 0–5)
HCT: 31.8 % — ABNORMAL LOW (ref 36.0–46.0)
Lymphocytes Relative: 29 % (ref 12–46)
MCHC: 35.8 g/dL (ref 30.0–36.0)
MCV: 83 fL (ref 78.0–100.0)
Monocytes Absolute: 0.7 10*3/uL (ref 0.1–1.0)
RDW: 12.1 % (ref 11.5–15.5)
WBC: 8 10*3/uL (ref 4.0–10.5)

## 2013-04-27 LAB — COMPREHENSIVE METABOLIC PANEL
AST: 11 U/L (ref 0–37)
CO2: 26 mEq/L (ref 19–32)
Calcium: 9.2 mg/dL (ref 8.4–10.5)
Creatinine, Ser: 0.7 mg/dL (ref 0.50–1.10)
GFR calc Af Amer: >90 mL/min (ref >90)
GFR calc non Af Amer: >90 mL/min (ref >90)

## 2013-04-27 NOTE — ED Provider Notes (Signed)
History  This chart was scribed for Geoffery Lyons, MD by Ardelia Mems, ED Scribe. This patient was seen in room MH07/MH07 and the patient's care was started at .   CSN: [id number]  Arrival date & time 04/27/13  2000     Chief Complaint  Patient presents with  . Chest Pain     The history is provided by the patient. No language interpreter was used.    HPI Comments: Stacy [name] is a 42 y.o. female brought in by EMS with a h/o anxiety and CP who presents to the Emergency Department complaining of a 1 hour episode of moderate, "sharp" center chest pain that awoke her from a nap 2 hours ago. There is associated nausea and headaches. Pt states that CP radiated down her left arm. Pt reports associated anxiety and cold sweats. Pt denies SOB. Pt denies h/o heart problems. Pt states that she was disoriented when the pain onset. Pt states that she is active and that heart problems do not run in her family. Pt denies abdominal pain, neck pain, vomiting or any other symptoms.   Past Medical History  Diagnosis Date  . Esophageal stricture   . Chest pain 09/14/2009    no pulmonary embolus by chest CT  . Palpitations   . Elevated BP   . Anxiety     past Hx  . Depression     past Hx  . GERD (gastroesophageal reflux disease)     no meds    Past Surgical History  Procedure Laterality Date  . Cesarean section  2004  . Laparoscopy for ectopic pregnancy  2001  . Wisdom tooth extraction  2012    No family history on file.  History  Substance Use Topics  . Smoking status: Never Smoker   . Smokeless tobacco: Not on file  . Alcohol Use: Yes     Comment: social    OB History   Grav Para Term Preterm Abortions TAB SAB Ect Mult Living   4 2              Review of Systems  Constitutional: Positive for chills. Negative for fever.  HENT: Negative for neck pain.   Respiratory: Negative for shortness of breath.   Cardiovascular: Positive for chest pain.  Gastrointestinal: Positive  for nausea. Negative for vomiting, abdominal pain and diarrhea.  Musculoskeletal: Negative for back pain.  Neurological: Positive for headaches.  All other systems reviewed and are negative.    Allergies  Diphenhydramine hcl; Hydromorphone hcl; and Morphine  Home Medications  No current outpatient prescriptions on file.  LMP 04/16/2013  Physical Exam  Nursing note and vitals reviewed. Constitutional: She is oriented to person, place, and time. She appears well-developed and well-nourished.  HENT:  Head: Normocephalic and atraumatic.  Eyes: EOM are normal. Pupils are equal, round, and reactive to light.  Neck: Normal range of motion. Neck supple. No tracheal deviation present.  Cardiovascular: Normal rate, regular rhythm and normal heart sounds.   No murmur heard. Pulmonary/Chest: Effort normal and breath sounds normal. No respiratory distress.  Abdominal: Soft. Bowel sounds are normal. There is no tenderness.  Musculoskeletal: Normal range of motion. She exhibits no tenderness.  Neurological: She is alert and oriented to person, place, and time.  Skin: Skin is warm. No rash noted.  Psychiatric: She has a normal mood and affect.    ED Course  Procedures (including critical care time)  DIAGNOSTIC STUDIES: Oxygen Saturation is 99% on room air, normal by  my interpretation.    COORDINATION OF CARE: 10:19 PM- Pt advised of plan for treatment and pt agrees.     Labs Reviewed  CBC WITH DIFFERENTIAL - Abnormal; Notable for the following:    RBC 3.83 (*)    Hemoglobin 11.4 (*)    HCT 31.8 (*)    All other components within normal limits  TROPONIN I  COMPREHENSIVE METABOLIC PANEL   No results found.   No diagnosis found.   Date: 04/27/2013  Rate: 91  Rhythm: normal sinus rhythm  QRS Axis: normal  Intervals: normal  ST/T Wave abnormalities: normal  Conduction Disutrbances:none  Narrative Interpretation:   Old EKG Reviewed: unchanged    MDM  The patient  presents with chest discomfort that started while she was resting.  The symptoms were atypical for cardiac pain and the ekg and labs are unremarkable.  She has no risk factors.  I doubt a cardiac etiology and I feel as though she is stable for discharge.  To follow up with pcp, return prn         I personally performed the services described in this documentation, which was scribed in my presence. The recorded information has been reviewed and is accurate.      Geoffery Lyons, MD 04/27/13 [phone]

## 2013-04-27 NOTE — ED Notes (Signed)
Patient arrived by EMS and reports that she was awakened from a nap with SSCP that she describes as sharp with nausea and headache. Now reports as heaviness

## 2013-04-27 NOTE — ED Notes (Signed)
Pt denies any complaints at this time

## 2013-04-30 ENCOUNTER — Encounter: Payer: Self-pay | Admitting: Family

## 2013-04-30 ENCOUNTER — Ambulatory Visit (INDEPENDENT_AMBULATORY_CARE_PROVIDER_SITE_OTHER): Payer: Self-pay | Admitting: Family

## 2013-04-30 VITALS — BP 116/72 | HR 85 | Temp 98.5°F | Resp 16 | Ht 65.0 in

## 2013-04-30 DIAGNOSIS — R0789 Other chest pain: Secondary | ICD-10-CM | POA: Insufficient documentation

## 2013-04-30 NOTE — Progress Notes (Signed)
  Subjective:    Patient ID: Stacy [name], female    DOB: 07/26/1971, 42 y.o.   MRN: [id number]  HPI  Stacy [name] is a 42 yr old female who presents today for ED follow up. Records are reviewed.  She was seen in the ED on 5/31 with chief complaint of chest pain.  TCP apparently woke her up from her nap.  CP was associated with anxiety. Tryponin was negative in the ED.  She was noted to be mildly anemic, and EKG noted Normal sinus rhythm.   Sunday she reports that she had an elevated blood pressure- reports dbp was 99.  She denies CP since returning home. Denies shortness of breath.  She continues to have right sided breast pain which is unchanged.  She denies family hx of CAD.  Denies calf pain, SOB or recent travel.  Review of Systems See HPI  Past Medical History  Diagnosis Date  . Esophageal stricture   . Chest pain 09/14/2009    no pulmonary embolus by chest CT  . Palpitations   . Elevated BP   . Anxiety     past Hx  . Depression     past Hx  . GERD (gastroesophageal reflux disease)     no meds    History   Social History  . Marital Status: Legally Separated    Spouse Name: N/A    Number of Children: 2  . Years of Education: N/A   Occupational History  .  [employer]   Social History Main Topics  . Smoking status: Never Smoker   . Smokeless tobacco: Not on file  . Alcohol Use: Yes     Comment: social  . Drug Use: No  . Sexually Active: No   Other Topics Concern  . Not on file   Social History Narrative  . No narrative on file    Past Surgical History  Procedure Laterality Date  . Cesarean section  2004  . Laparoscopy for ectopic pregnancy  2001  . Wisdom tooth extraction  2012    No family history on file.  Allergies  Allergen Reactions  . Diphenhydramine Hcl Nausea Only  . Hydromorphone Hcl Nausea And Vomiting  . Morphine Nausea And Vomiting    No current outpatient prescriptions on file prior to visit.   No current facility-administered  medications on file prior to visit.    BP 116/72  Pulse 85  Temp(Src) 98.5 F (36.9 C) (Oral)  Resp 16  Ht 5\' 5"  (1.651 m)  SpO2 99%  LMP 04/16/2013       Objective:   Physical Exam  Constitutional: She is oriented to person, place, and time. She appears well-developed and well-nourished. No distress.  Cardiovascular: Normal rate and regular rhythm.   No murmur heard. Pulmonary/Chest: Effort normal and breath sounds normal. No respiratory distress. She has no wheezes. She has no rales. She exhibits no tenderness.  Musculoskeletal: She exhibits no edema.  Neurological: She is alert and oriented to person, place, and time.  Psychiatric: She has a normal mood and affect. Her behavior is normal. Judgment and thought content normal.          Assessment & Plan:

## 2013-04-30 NOTE — Patient Instructions (Addendum)
Go to ER if you develop recurrent chest pain.  You can add prilosec 20 mg once daily for reflux.

## 2013-04-30 NOTE — Assessment & Plan Note (Signed)
Suspect gerd, will give trial of otc prilosec prn.

## 2013-07-29 ENCOUNTER — Encounter (HOSPITAL_COMMUNITY): Payer: Self-pay | Admitting: *Deleted

## 2013-07-29 ENCOUNTER — Inpatient Hospital Stay (HOSPITAL_COMMUNITY)
Admission: AD | Admit: 2013-07-29 | Discharge: 2013-07-29 | Disposition: A | Discharge status: Home / Self Care | Payer: Self-pay | Source: Ambulatory Visit | Attending: Obstetrics and Gynecology | Admitting: Obstetrics and Gynecology

## 2013-07-29 DIAGNOSIS — R109 Unspecified abdominal pain: Secondary | ICD-10-CM | POA: Insufficient documentation

## 2013-07-29 DIAGNOSIS — N390 Urinary tract infection, site not specified: Secondary | ICD-10-CM | POA: Insufficient documentation

## 2013-07-29 DIAGNOSIS — R110 Nausea: Secondary | ICD-10-CM | POA: Insufficient documentation

## 2013-07-29 DIAGNOSIS — M549 Dorsalgia, unspecified: Secondary | ICD-10-CM | POA: Insufficient documentation

## 2013-07-29 DIAGNOSIS — K5900 Constipation, unspecified: Secondary | ICD-10-CM | POA: Insufficient documentation

## 2013-07-29 HISTORY — DX: Reserved for concepts with insufficient information to code with codable children: IMO0002

## 2013-07-29 HISTORY — DX: Benign neoplasm of connective and other soft tissue, unspecified: D21.9

## 2013-07-29 HISTORY — DX: Unspecified abnormal cytological findings in specimens from cervix uteri: R87.619

## 2013-07-29 HISTORY — DX: Sickle-cell trait: D57.3

## 2013-07-29 LAB — CBC
HCT: 35.4 % — ABNORMAL LOW (ref 36.0–46.0)
Hemoglobin: 12.7 g/dL (ref 12.0–15.0)
MCH: 29.3 pg (ref 26.0–34.0)
MCHC: 35.9 g/dL (ref 30.0–36.0)
MCV: 81.6 fL (ref 78.0–100.0)

## 2013-07-29 LAB — COMPREHENSIVE METABOLIC PANEL
BUN: 9 mg/dL (ref 6–23)
CO2: 25 mEq/L (ref 19–32)
Calcium: 9.3 mg/dL (ref 8.4–10.5)
GFR calc Af Amer: >90 mL/min (ref >90)
GFR calc non Af Amer: >90 mL/min (ref >90)
Glucose, Bld: 77 mg/dL (ref 70–99)
Total Protein: 7.2 g/dL (ref 6.0–8.3)

## 2013-07-29 LAB — URINALYSIS, ROUTINE W REFLEX MICROSCOPIC
Leukocytes, UA: NEGATIVE
Nitrite: NEGATIVE
Specific Gravity, Urine: >1.03 — ABNORMAL HIGH (ref 1.005–1.030)
Urobilinogen, UA: 0.2 mg/dL (ref 0.0–1.0)

## 2013-07-29 LAB — POCT PREGNANCY, URINE: Preg Test, Ur: NEGATIVE

## 2013-07-29 LAB — URINE MICROSCOPIC-ADD ON

## 2013-07-29 MED ORDER — CIPROFLOXACIN HCL 250 MG PO TABS: 250.0000 mg | ORAL_TABLET | Freq: Two times a day (BID) | ORAL | Status: DC

## 2013-07-29 NOTE — MAU Note (Signed)
Severe Rt flank pain started 2 days ago, moving to lower rt side of abd.    Has been constipated for about a week.  Cycle started today, little late, has hx of ectopic.

## 2013-07-29 NOTE — MAU Provider Note (Signed)
History     CSN: [id number]  Arrival date and time: 07/29/13 4098   First Provider Initiated Contact with Patient 07/29/13 1017      Chief Complaint  Patient presents with  . Back Pain  . Abdominal Pain   HPI Ms. Stacy [name] is a 42 y.o. [id number] who presents to MAU today with right-sided mid back pain x days that has become worse today. She denies fever or UTI symptoms. She has had some nausea without vomiting and also endorses constipation. LMP today.    OB History   Grav Para Term Preterm Abortions TAB SAB Ect Mult Living   5 2 2  0 3 0 0 3 0 2      Past Medical History  Diagnosis Date  . Esophageal stricture   . Chest pain 09/14/2009    no pulmonary embolus by chest CT  . Palpitations   . Elevated BP   . Anxiety     past Hx  . Depression     past Hx  . GERD (gastroesophageal reflux disease)     no meds  . Sickle cell trait   . Fibroid   . Abnormal Pap smear     cryo, normal since    Past Surgical History  Procedure Laterality Date  . Cesarean section  2004  . Laparoscopy for ectopic pregnancy  2001  . Wisdom tooth extraction  2012    Family History  Problem Relation Age of Onset  . Asthma Mother   . Hypertension Mother   . Hypertension Paternal Uncle   . Asthma Maternal Grandmother     History  Substance Use Topics  . Smoking status: Never Smoker   . Smokeless tobacco: Never Used  . Alcohol Use: Yes     Comment: social    Allergies:  Allergies  Allergen Reactions  . Diphenhydramine Hcl Nausea Only  . Hydromorphone Hcl Nausea And Vomiting  . Morphine Nausea And Vomiting    No prescriptions prior to admission    Review of Systems  Constitutional: Negative for fever and malaise/fatigue.  Gastrointestinal: Positive for nausea, abdominal pain and constipation. Negative for vomiting and diarrhea.  Genitourinary: Positive for flank pain. Negative for dysuria, urgency, frequency and hematuria.       + vaginal bleeding Neg - vaginal  discharge  Musculoskeletal: Positive for back pain.   Physical Exam   Blood pressure 119/86, pulse 86, temperature 98.2 F (36.8 C), temperature source Oral, resp. rate 18, height 5' 4.25" (1.632 m), weight 196 lb (88.905 kg), last menstrual period 07/29/2013.  Physical Exam  Constitutional: She is oriented to person, place, and time. She appears well-developed and well-nourished. No distress.  HENT:  Head: Normocephalic and atraumatic.  Cardiovascular: Normal rate, regular rhythm and normal heart sounds.   Respiratory: Breath sounds normal. No respiratory distress.  GI: Soft. Bowel sounds are normal. She exhibits no distension and no mass. There is tenderness (mild tenderness to palpation of the RLQ). There is CVA tenderness (right side only). There is no rebound and no guarding.  Neurological: She is alert and oriented to person, place, and time.  Skin: Skin is warm and dry. No erythema.  Psychiatric: She has a normal mood and affect.   Results for orders placed during the hospital encounter of 07/29/13 (from the past 24 hour(s))  URINALYSIS, ROUTINE W REFLEX MICROSCOPIC     Status: Abnormal   Collection Time    07/29/13  9:50 AM      Result Value  Range   Color, Urine YELLOW  YELLOW   APPearance CLEAR  CLEAR   Specific Gravity, Urine >1.030 (*) 1.005 - 1.030   pH 5.5  5.0 - 8.0   Glucose, UA NEGATIVE  NEGATIVE mg/dL   Hgb urine dipstick LARGE (*) NEGATIVE   Bilirubin Urine NEGATIVE  NEGATIVE   Ketones, ur NEGATIVE  NEGATIVE mg/dL   Protein, ur NEGATIVE  NEGATIVE mg/dL   Urobilinogen, UA 0.2  0.0 - 1.0 mg/dL   Nitrite NEGATIVE  NEGATIVE   Leukocytes, UA NEGATIVE  NEGATIVE  URINE MICROSCOPIC-ADD ON     Status: Abnormal   Collection Time    07/29/13  9:50 AM      Result Value Range   Squamous Epithelial / LPF MANY (*) RARE   WBC, UA 0-2  <3 WBC/hpf   RBC / HPF 7-10  <3 RBC/hpf   Bacteria, UA MANY (*) RARE   Urine-Other MUCOUS PRESENT    POCT PREGNANCY, URINE     Status:  None   Collection Time    07/29/13  9:55 AM      Result Value Range   Preg Test, Ur NEGATIVE  NEGATIVE  CBC     Status: Abnormal   Collection Time    07/29/13 10:32 AM      Result Value Range   WBC 5.6  4.0 - 10.5 K/uL   RBC 4.34  3.87 - 5.11 MIL/uL   Hemoglobin 12.7  12.0 - 15.0 g/dL   HCT 78.2 (*) 95.6 - 21.3 %   MCV 81.6  78.0 - 100.0 fL   MCH 29.3  26.0 - 34.0 pg   MCHC 35.9  30.0 - 36.0 g/dL   RDW 08.6  57.8 - 46.9 %   Platelets 324  150 - 400 K/uL  COMPREHENSIVE METABOLIC PANEL     Status: None   Collection Time    07/29/13 10:32 AM      Result Value Range   Sodium 139  135 - 145 mEq/L   Potassium 3.6  3.5 - 5.1 mEq/L   Chloride 105  96 - 112 mEq/L   CO2 25  19 - 32 mEq/L   Glucose, Bld 77  70 - 99 mg/dL   BUN 9  6 - 23 mg/dL   Creatinine, Ser 6.29  0.50 - 1.10 mg/dL   Calcium 9.3  8.4 - 52.8 mg/dL   Total Protein 7.2  6.0 - 8.3 g/dL   Albumin 3.5  3.5 - 5.2 g/dL   AST 11  0 - 37 U/L   ALT 8  0 - 35 U/L   Alkaline Phosphatase 57  39 - 117 U/L   Total Bilirubin 0.4  0.3 - 1.2 mg/dL   GFR calc non Af Amer >90  >90 mL/min   GFR calc Af Amer >90  >90 mL/min    MAU Course  Procedures None  MDM UPT negative UA, CBC, CMP today  Assessment and Plan  A: UTI Constipation  P: Discharge home Rx for Cipro sent to patient's pharmacy Pyelonephritis warning signs discussed Patient advised to take Mirilax for constipation Patient encouraged to follow-up with PCP or [facility] or [facility] if symptoms persist or worsen Patient may return to MAU as needed  Freddi Starr, PA-C 07/29/2013, 2:27 PM

## 2013-07-30 NOTE — MAU Provider Note (Signed)
Attestation of Attending Supervision of Advanced Practitioner (CNM/NP): Evaluation and management procedures were performed by the Advanced Practitioner under my supervision and collaboration.  I have reviewed the Advanced Practitioner's note and chart, and I agree with the management and plan.  Jaykwon Morones 07/30/2013 8:09 AM

## 2013-09-27 ENCOUNTER — Encounter: Payer: Self-pay | Admitting: Gastroenterology

## 2013-09-30 ENCOUNTER — Emergency Department (HOSPITAL_BASED_OUTPATIENT_CLINIC_OR_DEPARTMENT_OTHER)
Admission: EM | Admit: 2013-09-30 | Discharge: 2013-09-30 | Disposition: A | Discharge status: Home / Self Care | Payer: 59 | Attending: Emergency Medicine | Admitting: Emergency Medicine

## 2013-09-30 ENCOUNTER — Encounter (HOSPITAL_BASED_OUTPATIENT_CLINIC_OR_DEPARTMENT_OTHER): Payer: Self-pay | Admitting: Emergency Medicine

## 2013-09-30 DIAGNOSIS — Z3202 Encounter for pregnancy test, result negative: Secondary | ICD-10-CM | POA: Insufficient documentation

## 2013-09-30 DIAGNOSIS — K219 Gastro-esophageal reflux disease without esophagitis: Secondary | ICD-10-CM | POA: Insufficient documentation

## 2013-09-30 DIAGNOSIS — R110 Nausea: Secondary | ICD-10-CM | POA: Insufficient documentation

## 2013-09-30 DIAGNOSIS — K921 Melena: Secondary | ICD-10-CM | POA: Insufficient documentation

## 2013-09-30 DIAGNOSIS — Z862 Personal history of diseases of the blood and blood-forming organs and certain disorders involving the immune mechanism: Secondary | ICD-10-CM | POA: Insufficient documentation

## 2013-09-30 DIAGNOSIS — Z8542 Personal history of malignant neoplasm of other parts of uterus: Secondary | ICD-10-CM | POA: Insufficient documentation

## 2013-09-30 DIAGNOSIS — R1013 Epigastric pain: Secondary | ICD-10-CM | POA: Insufficient documentation

## 2013-09-30 DIAGNOSIS — J029 Acute pharyngitis, unspecified: Secondary | ICD-10-CM | POA: Insufficient documentation

## 2013-09-30 DIAGNOSIS — K222 Esophageal obstruction: Secondary | ICD-10-CM | POA: Insufficient documentation

## 2013-09-30 DIAGNOSIS — Z8659 Personal history of other mental and behavioral disorders: Secondary | ICD-10-CM | POA: Insufficient documentation

## 2013-09-30 LAB — LIPASE, BLOOD: Lipase: 13 U/L (ref 11–59)

## 2013-09-30 LAB — CBC WITH DIFFERENTIAL/PLATELET
Basophils Absolute: 0 10*3/uL (ref 0.0–0.1)
Basophils Relative: 0 % (ref 0–1)
Eosinophils Absolute: 0.1 10*3/uL (ref 0.0–0.7)
Eosinophils Relative: 2 % (ref 0–5)
Lymphs Abs: 2 10*3/uL (ref 0.7–4.0)
MCH: 29.1 pg (ref 26.0–34.0)
MCV: 81.9 fL (ref 78.0–100.0)
Neutrophils Relative %: 50 % (ref 43–77)
Platelets: 363 10*3/uL (ref 150–400)
RBC: 4.43 MIL/uL (ref 3.87–5.11)
RDW: 12.8 % (ref 11.5–15.5)

## 2013-09-30 LAB — URINE MICROSCOPIC-ADD ON

## 2013-09-30 LAB — URINALYSIS, ROUTINE W REFLEX MICROSCOPIC
Ketones, ur: NEGATIVE mg/dL
Nitrite: NEGATIVE
Specific Gravity, Urine: 1.017 (ref 1.005–1.030)
Urobilinogen, UA: 0.2 mg/dL (ref 0.0–1.0)
pH: 6.5 (ref 5.0–8.0)

## 2013-09-30 LAB — COMPREHENSIVE METABOLIC PANEL
ALT: 8 U/L (ref 0–35)
AST: 14 U/L (ref 0–37)
Albumin: 3.6 g/dL (ref 3.5–5.2)
CO2: 29 mEq/L (ref 19–32)
Calcium: 9.3 mg/dL (ref 8.4–10.5)
Creatinine, Ser: 0.7 mg/dL (ref 0.50–1.10)
GFR calc non Af Amer: >90 mL/min (ref >90)
Sodium: 139 mEq/L (ref 135–145)
Total Protein: 7.7 g/dL (ref 6.0–8.3)

## 2013-09-30 MED ORDER — PANTOPRAZOLE SODIUM 40 MG IV SOLR
40.0000 mg | Freq: Once | INTRAVENOUS | Status: AC
  Administered 2013-09-30: 40 mg via INTRAVENOUS
  Filled 2013-09-30: qty 40

## 2013-09-30 MED ORDER — GI COCKTAIL ~~LOC~~
30.0000 mL | Freq: Once | ORAL | Status: AC
  Administered 2013-09-30: 30 mL via ORAL
  Filled 2013-09-30: qty 30

## 2013-09-30 MED ORDER — PANTOPRAZOLE SODIUM 20 MG PO TBEC: 20.0000 mg | DELAYED_RELEASE_TABLET | Freq: Every day | ORAL | Status: DC

## 2013-09-30 MED ORDER — SULFAMETHOXAZOLE-TRIMETHOPRIM 800-160 MG PO TABS: 1.0000 | ORAL_TABLET | Freq: Two times a day (BID) | ORAL | Status: DC

## 2013-09-30 NOTE — ED Provider Notes (Signed)
Medical screening examination/treatment/procedure(s) were performed by non-physician practitioner and as supervising physician I was immediately available for consultation/collaboration.  EKG Interpretation   None        Glynn Octave, MD 09/30/13 1726

## 2013-09-30 NOTE — ED Notes (Signed)
Pt reports 2 week hx of abdominal pain and difficulty swallowing.  Reports that  Her stools have been dark and she has been constipated.  Reports hx of gerd and feels that she is having an increase in that.  Reports nausea no vomiting.

## 2013-09-30 NOTE — ED Provider Notes (Signed)
CSN: [id number]     Arrival date & time 09/30/13  0102 History   First MD Initiated Contact with Patient 09/30/13 [phone]     Chief Complaint  Patient presents with  . Abdominal Pain   (Consider location/radiation/quality/duration/timing/severity/associated sxs/prior Treatment) Patient is a 42 y.o. female presenting with abdominal pain. The history is provided by the patient and medical records. No language interpreter was used.  Abdominal Pain Pain location:  Epigastric Pain quality: burning   Pain radiates to:  Chest Pain severity:  Moderate Onset quality:  Gradual Duration:  2 weeks Timing:  Intermittent Progression:  Worsening Chronicity:  Recurrent Worsened by:  Nothing tried Ineffective treatments:  Antacids Associated symptoms: melena, nausea and sore throat   Associated symptoms: no vomiting   Nausea:    Severity:  Moderate   Onset quality:  Gradual   Duration:  2 weeks   Timing:  Intermittent   Progression:  Waxing and waning Sore throat:    Severity:  Moderate   Onset quality:  Gradual   Duration:  2 weeks   Progression:  Waxing and waning Risk factors comment:  GERD, esophageal stricture   Past Medical History  Diagnosis Date  . Esophageal stricture   . Chest pain 09/14/2009    no pulmonary embolus by chest CT  . Palpitations   . Elevated BP   . Anxiety     past Hx  . Depression     past Hx  . GERD (gastroesophageal reflux disease)     no meds  . Sickle cell trait   . Fibroid   . Abnormal Pap smear     cryo, normal since   Past Surgical History  Procedure Laterality Date  . Cesarean section  2004  . Laparoscopy for ectopic pregnancy  2001  . Wisdom tooth extraction  2012   Family History  Problem Relation Age of Onset  . Asthma Mother   . Hypertension Mother   . Hypertension Paternal Uncle   . Asthma Maternal Grandmother    History  Substance Use Topics  . Smoking status: Never Smoker   . Smokeless tobacco: Never Used  . Alcohol Use: Yes      Comment: social   OB History   Grav Para Term Preterm Abortions TAB SAB Ect Mult Living   5 2 2  0 3 0 0 3 0 2     Review of Systems  HENT: Positive for sore throat.   Gastrointestinal: Positive for nausea, abdominal pain and melena. Negative for vomiting.  All other systems reviewed and are negative.    Allergies  Diphenhydramine hcl; Hydromorphone hcl; and Morphine  Home Medications   Current Outpatient Rx  Name  Route  Sig  Dispense  Refill  . ibuprofen (ADVIL,MOTRIN) 200 MG tablet   Oral   Take 400 mg by mouth every 6 (six) hours as needed for pain.          BP 130/84  Pulse 71  Temp(Src) 98.6 F (37 C) (Oral)  Resp 18  SpO2 100% Physical Exam  Nursing note and vitals reviewed. Constitutional: She is oriented to person, place, and time. She appears well-developed and well-nourished.  HENT:  Head: Normocephalic.  Eyes: Pupils are equal, round, and reactive to light.  Neck: Normal range of motion.  Cardiovascular: Normal rate and regular rhythm.   Pulmonary/Chest: Effort normal and breath sounds normal.  Abdominal: Soft. There is tenderness.    Musculoskeletal: She exhibits no edema and no tenderness.  Lymphadenopathy:  She has no cervical adenopathy.  Neurological: She is alert and oriented to person, place, and time.  Skin: Skin is warm and dry.  Psychiatric: She has a normal mood and affect. Her behavior is normal. Judgment and thought content normal.    ED Course  Procedures (including critical care time) Labs Review Labs Reviewed  PREGNANCY, URINE  URINALYSIS, ROUTINE W REFLEX MICROSCOPIC  CBC WITH DIFFERENTIAL  LIPASE, BLOOD  COMPREHENSIVE METABOLIC PANEL  OCCULT BLOOD X 1 CARD TO LAB, STOOL   Imaging Review No results found.  EKG Interpretation   None     Lab results reviewed, shared with patient.  No indication of active/occult GI bleeding.  UA indicates UTI.  Patient's epigastric pain improved after GI cocktail and protonix.   Patient is scheduled for follow-up with GI at [facility] on 10/09/13.  MDM  Epigastric pain. UTI.    Jimmye Norman, NP 09/30/13 [phone]

## 2013-10-01 LAB — URINE CULTURE: Colony Count: 75000

## 2013-10-28 ENCOUNTER — Ambulatory Visit: Payer: Self-pay | Admitting: Gastroenterology

## 2013-10-29 ENCOUNTER — Emergency Department (HOSPITAL_COMMUNITY): Payer: 59

## 2013-10-29 ENCOUNTER — Encounter (HOSPITAL_COMMUNITY): Payer: Self-pay | Admitting: Emergency Medicine

## 2013-10-29 ENCOUNTER — Emergency Department (HOSPITAL_COMMUNITY)
Admission: EM | Admit: 2013-10-29 | Discharge: 2013-10-29 | Disposition: A | Discharge status: Home / Self Care | Payer: 59 | Attending: Emergency Medicine | Admitting: Emergency Medicine

## 2013-10-29 DIAGNOSIS — Z8742 Personal history of other diseases of the female genital tract: Secondary | ICD-10-CM | POA: Insufficient documentation

## 2013-10-29 DIAGNOSIS — R42 Dizziness and giddiness: Secondary | ICD-10-CM | POA: Insufficient documentation

## 2013-10-29 DIAGNOSIS — R079 Chest pain, unspecified: Secondary | ICD-10-CM | POA: Insufficient documentation

## 2013-10-29 DIAGNOSIS — Z79899 Other long term (current) drug therapy: Secondary | ICD-10-CM | POA: Insufficient documentation

## 2013-10-29 DIAGNOSIS — R197 Diarrhea, unspecified: Secondary | ICD-10-CM | POA: Insufficient documentation

## 2013-10-29 DIAGNOSIS — R1110 Vomiting, unspecified: Secondary | ICD-10-CM | POA: Insufficient documentation

## 2013-10-29 DIAGNOSIS — R1013 Epigastric pain: Secondary | ICD-10-CM | POA: Insufficient documentation

## 2013-10-29 DIAGNOSIS — Z8659 Personal history of other mental and behavioral disorders: Secondary | ICD-10-CM | POA: Insufficient documentation

## 2013-10-29 DIAGNOSIS — Z862 Personal history of diseases of the blood and blood-forming organs and certain disorders involving the immune mechanism: Secondary | ICD-10-CM | POA: Insufficient documentation

## 2013-10-29 DIAGNOSIS — K219 Gastro-esophageal reflux disease without esophagitis: Secondary | ICD-10-CM | POA: Insufficient documentation

## 2013-10-29 DIAGNOSIS — R5381 Other malaise: Secondary | ICD-10-CM | POA: Insufficient documentation

## 2013-10-29 LAB — CBC WITH DIFFERENTIAL/PLATELET
Basophils Absolute: 0 10*3/uL (ref 0.0–0.1)
Basophils Relative: 0 % (ref 0–1)
Eosinophils Absolute: 0.1 10*3/uL (ref 0.0–0.7)
MCH: 29.5 pg (ref 26.0–34.0)
MCHC: 35.9 g/dL (ref 30.0–36.0)
Neutrophils Relative %: 68 % (ref 43–77)
Platelets: 331 10*3/uL (ref 150–400)
RDW: 13.5 % (ref 11.5–15.5)

## 2013-10-29 LAB — LIPASE, BLOOD: Lipase: 15 U/L (ref 11–59)

## 2013-10-29 LAB — SAMPLE TO BLOOD BANK

## 2013-10-29 LAB — COMPREHENSIVE METABOLIC PANEL
ALT: 8 U/L (ref 0–35)
AST: 13 U/L (ref 0–37)
Albumin: 3.5 g/dL (ref 3.5–5.2)
Alkaline Phosphatase: 54 U/L (ref 39–117)
Potassium: 3.5 mEq/L (ref 3.5–5.1)
Sodium: 137 mEq/L (ref 135–145)
Total Bilirubin: 0.5 mg/dL (ref 0.3–1.2)
Total Protein: 7.4 g/dL (ref 6.0–8.3)

## 2013-10-29 MED ORDER — FAMOTIDINE IN NACL 20-0.9 MG/50ML-% IV SOLN
20.0000 mg | Freq: Once | INTRAVENOUS | Status: AC
  Administered 2013-10-29: 20 mg via INTRAVENOUS
  Filled 2013-10-29: qty 50

## 2013-10-29 MED ORDER — SODIUM CHLORIDE 0.9 % IV BOLUS (SEPSIS)
1000.0000 mL | Freq: Once | INTRAVENOUS | Status: AC
  Administered 2013-10-29: 1000 mL via INTRAVENOUS

## 2013-10-29 MED ORDER — ONDANSETRON HCL 4 MG/2ML IJ SOLN
4.0000 mg | Freq: Once | INTRAMUSCULAR | Status: AC
  Administered 2013-10-29: 4 mg via INTRAVENOUS
  Filled 2013-10-29: qty 2

## 2013-10-29 MED ORDER — ONDANSETRON 4 MG PO TBDP: ORAL_TABLET | ORAL | Status: DC

## 2013-10-29 NOTE — ED Notes (Signed)
Pt reports GI flare over past 2 months. Scheduled colonoscopy and endoscopy for tomorrow. Pt took Miralax and Dulcolax last night. Pt reports that she has had diarrhea x 10 hours and this am she started vomiting up blood.

## 2013-10-29 NOTE — ED Provider Notes (Signed)
CSN: [id number]     Arrival date & time 10/29/13  0630 History   First MD Initiated Contact with Patient 10/29/13 [phone]     Chief Complaint  Patient presents with  . Hematemesis   (Consider location/radiation/quality/duration/timing/severity/associated sxs/prior Treatment) HPI Comments: 42 yo female with gerd, atypical cp hx presents with epig pain, vomiting and diarrhea since last night.  Mostly clear however streaks of lighter blood.  No known ulcer or etoh intake.  Pt has scopes planned with [facility] [place] [facility] tomorrow and has been drinking cocktail. Pt feels generally weak.  No lower GI blood.  Pt has had GI sxs for 3 yrs, intermittent flares.   The history is provided by the patient.    Past Medical History  Diagnosis Date  . Esophageal stricture   . Chest pain 09/14/2009    no pulmonary embolus by chest CT  . Palpitations   . Elevated BP   . Anxiety     past Hx  . Depression     past Hx  . GERD (gastroesophageal reflux disease)     no meds  . Sickle cell trait   . Fibroid   . Abnormal Pap smear     cryo, normal since   Past Surgical History  Procedure Laterality Date  . Cesarean section  2004  . Laparoscopy for ectopic pregnancy  2001  . Wisdom tooth extraction  2012   Family History  Problem Relation Age of Onset  . Asthma Mother   . Hypertension Mother   . Hypertension Paternal Uncle   . Asthma Maternal Grandmother    History  Substance Use Topics  . Smoking status: Never Smoker   . Smokeless tobacco: Never Used  . Alcohol Use: Yes     Comment: social   OB History   Grav Para Term Preterm Abortions TAB SAB Ect Mult Living   5 2 2  0 3 0 0 3 0 2     Review of Systems  Constitutional: Positive for appetite change. Negative for fever and chills.  HENT: Negative for congestion.   Eyes: Negative for visual disturbance.  Respiratory: Negative for shortness of breath.   Cardiovascular: Positive for chest pain (burning anterior, similar to previous, non radiating,  no exertional or diaphoresis).  Gastrointestinal: Negative for vomiting, abdominal pain and blood in stool.  Genitourinary: Negative for dysuria and flank pain.  Musculoskeletal: Negative for back pain, neck pain and neck stiffness.  Skin: Negative for rash.  Neurological: Positive for weakness and light-headedness. Negative for headaches.    Allergies  Diphenhydramine hcl; Hydromorphone hcl; and Morphine  Home Medications   Current Outpatient Rx  Name  Route  Sig  Dispense  Refill  . ibuprofen (ADVIL,MOTRIN) 200 MG tablet   Oral   Take 400 mg by mouth every 6 (six) hours as needed for pain.         . pantoprazole (PROTONIX) 20 MG tablet   Oral   Take 1 tablet (20 mg total) by mouth daily.   15 tablet   0    BP 116/70  Pulse 87  Temp(Src) 98.3 F (36.8 C) (Oral)  Resp 18  Ht 5\' 5"  (1.651 m)  Wt 198 lb (89.812 kg)  BMI 32.95 kg/m2  SpO2 99%  LMP 10/19/2013 Physical Exam  Nursing note and vitals reviewed. Constitutional: She is oriented to person, place, and time. She appears well-developed and well-nourished.  HENT:  Head: Normocephalic and atraumatic.  Mild dry mm  Eyes: Conjunctivae are normal. Right  eye exhibits no discharge. Left eye exhibits no discharge.  Neck: Normal range of motion. Neck supple. No tracheal deviation present.  Cardiovascular: Normal rate and regular rhythm.   Pulmonary/Chest: Effort normal and breath sounds normal.  Abdominal: Soft. She exhibits no distension. There is no tenderness. There is no guarding.  Musculoskeletal: She exhibits no edema and no tenderness.  Neurological: She is alert and oriented to person, place, and time.  Skin: Skin is warm. No rash noted.  Psychiatric: She has a normal mood and affect.    ED Course  Procedures (including critical care time) Labs Review Labs Reviewed  CBC WITH DIFFERENTIAL - Abnormal; Notable for the following:    HCT 35.9 (*)    All other components within normal limits  COMPREHENSIVE  METABOLIC PANEL  TROPONIN I  LIPASE, BLOOD  SAMPLE TO BLOOD BANK   Imaging Review Dg Chest 2 View  10/29/2013   CLINICAL DATA:  Chest pain.  EXAM: CHEST  2 VIEW  COMPARISON:  Apr 20, 2013.  FINDINGS: The heart size and mediastinal contours are within normal limits. Both lungs are clear. No pleural effusion or pneumothorax is noted. The visualized skeletal structures are unremarkable.  IMPRESSION: No acute cardiopulmonary abnormality seen.   Electronically Signed   By: Roque Lias M.D.   On: 10/29/2013 08:00    EKG Interpretation    Date/Time:  Tuesday October 29 2013 06:52:43 EST Ventricular Rate:  77 PR Interval:  148 QRS Duration: 108 QT Interval:  382 QTC Calculation: 432 R Axis:   33 Text Interpretation:  Sinus rhythm RSR' in V1 or V2, right VCD or RVH No significant change since last tracing Confirmed by OTTER  MD, OLGA ([id number]) on 10/29/2013 7:00:21 AM            MDM   1. Vomiting   2. Diarrhea   3. Epigastric pain         3. Epigastric pain    Clinically gastritis/ gerd sxs. Pt well appearing. Plan for fluids and labs. No blood clot risk factors. Screening ekg and troponin for atypical chest burning after vomiting.  No cardiac risk factors.    Pt improved on recheck, vomiting stopped. Pt has close fup with GI tomorrow and comfortable with that plan.  Results and differential diagnosis were discussed with the patient. Close follow up outpatient was discussed, patient comfortable with the plan.     Enid Skeens, MD 10/29/13 [phone]

## 2013-11-04 ENCOUNTER — Ambulatory Visit: Payer: 59 | Admitting: Family

## 2013-11-04 ENCOUNTER — Ambulatory Visit (INDEPENDENT_AMBULATORY_CARE_PROVIDER_SITE_OTHER): Payer: 59 | Admitting: Family

## 2013-11-04 ENCOUNTER — Encounter: Payer: Self-pay | Admitting: Family

## 2013-11-04 VITALS — BP 128/88 | HR 60 | Temp 97.9°F | Resp 16

## 2013-11-04 DIAGNOSIS — R5381 Other malaise: Secondary | ICD-10-CM

## 2013-11-04 DIAGNOSIS — R609 Edema, unspecified: Secondary | ICD-10-CM

## 2013-11-04 DIAGNOSIS — R350 Frequency of micturition: Secondary | ICD-10-CM

## 2013-11-04 LAB — POCT URINALYSIS DIPSTICK
Bilirubin Urine: 0 mg/dL
Blood, UA: NEGATIVE
Glucose, UA: NEGATIVE
Leukocytes, UA: NEGATIVE
Nitrite, UA: NEGATIVE
Spec Grav, UA: <=1.005
Urobilinogen, UA: 0.2
pH, Urine: 7.5

## 2013-11-04 MED ORDER — FUROSEMIDE 20 MG PO TABS: 20.0000 mg | ORAL_TABLET | Freq: Every day | ORAL | Status: DC

## 2013-11-04 NOTE — Patient Instructions (Signed)
Please complete your lab work prior to leaving. Follow up in 1-2 weeks.

## 2013-11-04 NOTE — Progress Notes (Signed)
Subjective:    Patient ID: Stacy [name], female    DOB: Mar 02, 1971, 42 y.o.   MRN: [id number]  HPI  Ms. Stacy [name] is a 42 yr old female who presents today with chief complaint of "insect bite" on the left calf. Had insect redness left medial calf yesterday with similar rash on right calf though not as red. Reports resolution of rash.  Noticed swelling in her legs/feet bilaterally last night.  Reports some blurring of her vision (both eyes) since yesterday. Denies SOB or chest pain.  Has not changed her diet but notes that her diet is "not good." does have chronic gerd symptoms. Recently had Endoscopy at [facility]. Was told endo was normal. Also reports colo was normal but told poor prep.   Review of Systems See HPI  Past Medical History  Diagnosis Date  . Esophageal stricture   . Chest pain 09/14/2009    no pulmonary embolus by chest CT  . Palpitations   . Elevated BP   . Anxiety     past Hx  . Depression     past Hx  . GERD (gastroesophageal reflux disease)     no meds  . Sickle cell trait   . Fibroid   . Abnormal Pap smear     cryo, normal since    History   Social History  . Marital Status: Legally Separated    Spouse Name: N/A    Number of Children: 2  . Years of Education: N/A   Occupational History  .  [employer]   Social History Main Topics  . Smoking status: Never Smoker   . Smokeless tobacco: Never Used  . Alcohol Use: Yes     Comment: social  . Drug Use: No  . Sexual Activity: Yes    Birth Control/ Protection: None   Other Topics Concern  . Not on file   Social History Narrative  . No narrative on file    Past Surgical History  Procedure Laterality Date  . Cesarean section  2004  . Laparoscopy for ectopic pregnancy  2001  . Wisdom tooth extraction  2012    Family History  Problem Relation Age of Onset  . Asthma Mother   . Hypertension Mother   . Hypertension Paternal Uncle   . Asthma Maternal Grandmother     Allergies  Allergen  Reactions  . Diphenhydramine Hcl Nausea Only  . Hydromorphone Hcl Nausea And Vomiting  . Morphine Nausea And Vomiting    Current Outpatient Prescriptions on File Prior to Visit  Medication Sig Dispense Refill  . pantoprazole (PROTONIX) 20 MG tablet Take 1 tablet (20 mg total) by mouth daily.  15 tablet  0   No current facility-administered medications on file prior to visit.    BP 128/88  Pulse 60  Temp(Src) 97.9 F (36.6 C) (Oral)  Resp 16  LMP 10/30/2013       Objective:   Physical Exam  Constitutional: She is oriented to person, place, and time. She appears well-developed and well-nourished. No distress.  HENT:  Head: Normocephalic and atraumatic.  Cardiovascular: Normal rate and regular rhythm.   No murmur heard. Pulmonary/Chest: Effort normal and breath sounds normal. No respiratory distress. She has no wheezes. She has no rales. She exhibits no tenderness.  Musculoskeletal:  2+ bilateral LE/pedal edema  Neurological: She is alert and oriented to person, place, and time.  Skin:  No rash or bite noted on leg  Psychiatric: She has a normal mood and  affect. Her behavior is normal. Judgment and thought content normal.          Assessment & Plan:

## 2013-11-05 ENCOUNTER — Encounter: Payer: Self-pay | Admitting: Family

## 2013-11-05 LAB — BASIC METABOLIC PANEL
CO2: 28 mEq/L (ref 19–32)
Calcium: 9.1 mg/dL (ref 8.4–10.5)
Chloride: 103 mEq/L (ref 96–112)
Creat: 0.75 mg/dL (ref 0.50–1.10)
Glucose, Bld: 82 mg/dL (ref 70–99)
Sodium: 139 mEq/L (ref 135–145)

## 2013-11-05 LAB — HEPATIC FUNCTION PANEL
Alkaline Phosphatase: 50 U/L (ref 39–117)
Bilirubin, Direct: 0.1 mg/dL (ref 0.0–0.3)
Indirect Bilirubin: 0.2 mg/dL (ref 0.0–0.9)
Total Protein: 7 g/dL (ref 6.0–8.3)

## 2013-11-06 ENCOUNTER — Telehealth: Payer: Self-pay | Admitting: Family

## 2013-11-06 DIAGNOSIS — M79605 Pain in left leg: Secondary | ICD-10-CM

## 2013-11-06 NOTE — Telephone Encounter (Signed)
Requesting lab results

## 2013-11-06 NOTE — Telephone Encounter (Signed)
Pt left message stating she still isn't feeling any better and is wanting test results.  Please advise.

## 2013-11-06 NOTE — Telephone Encounter (Addendum)
Left message requesting that pt return our call. Labs normal (see lab letter).   Addendum: pt returned my call. Notes left leg still swollen and causing her discomfort. Advised her to complete LLE doppler and schedule follow up in 1-2 weeks. She is agreeable.

## 2013-11-07 ENCOUNTER — Other Ambulatory Visit (HOSPITAL_BASED_OUTPATIENT_CLINIC_OR_DEPARTMENT_OTHER): Payer: 59

## 2013-11-07 DIAGNOSIS — R609 Edema, unspecified: Secondary | ICD-10-CM | POA: Insufficient documentation

## 2013-11-07 NOTE — Assessment & Plan Note (Signed)
Recent hgb normal. BMET, LFT, TSH unremarkable.

## 2013-11-07 NOTE — Assessment & Plan Note (Addendum)
Trial of lasix.  No clinical sign of volume overload.  If SOB or if symptoms worsen, consider 2d echo.   Discussed low sodium diet and elevate feet.

## 2013-11-08 ENCOUNTER — Ambulatory Visit (HOSPITAL_BASED_OUTPATIENT_CLINIC_OR_DEPARTMENT_OTHER)
Admission: RE | Admit: 2013-11-08 | Discharge: 2013-11-08 | Disposition: A | Discharge status: Home / Self Care | Payer: 59 | Source: Ambulatory Visit | Attending: Family | Admitting: Family

## 2013-11-08 DIAGNOSIS — M7989 Other specified soft tissue disorders: Secondary | ICD-10-CM | POA: Insufficient documentation

## 2013-11-08 DIAGNOSIS — M79605 Pain in left leg: Secondary | ICD-10-CM

## 2013-11-08 DIAGNOSIS — M79609 Pain in unspecified limb: Secondary | ICD-10-CM | POA: Insufficient documentation

## 2013-11-16 ENCOUNTER — Encounter (HOSPITAL_COMMUNITY): Payer: Self-pay | Admitting: Emergency Medicine

## 2013-11-16 ENCOUNTER — Emergency Department (HOSPITAL_COMMUNITY): Payer: 59

## 2013-11-16 ENCOUNTER — Emergency Department (HOSPITAL_COMMUNITY)
Admission: EM | Admit: 2013-11-16 | Discharge: 2013-11-16 | Disposition: A | Discharge status: Other Acute Inpatient Hospital | Payer: 59 | Source: Home / Self Care | Attending: Family Medicine | Admitting: Family Medicine

## 2013-11-16 ENCOUNTER — Emergency Department (HOSPITAL_COMMUNITY)
Admission: EM | Admit: 2013-11-16 | Discharge: 2013-11-16 | Disposition: A | Discharge status: Home / Self Care | Payer: 59 | Attending: Emergency Medicine | Admitting: Emergency Medicine

## 2013-11-16 DIAGNOSIS — R079 Chest pain, unspecified: Secondary | ICD-10-CM | POA: Insufficient documentation

## 2013-11-16 DIAGNOSIS — Z79899 Other long term (current) drug therapy: Secondary | ICD-10-CM | POA: Insufficient documentation

## 2013-11-16 DIAGNOSIS — Z862 Personal history of diseases of the blood and blood-forming organs and certain disorders involving the immune mechanism: Secondary | ICD-10-CM | POA: Insufficient documentation

## 2013-11-16 DIAGNOSIS — Z8742 Personal history of other diseases of the female genital tract: Secondary | ICD-10-CM | POA: Insufficient documentation

## 2013-11-16 DIAGNOSIS — M25519 Pain in unspecified shoulder: Secondary | ICD-10-CM | POA: Insufficient documentation

## 2013-11-16 DIAGNOSIS — Z8659 Personal history of other mental and behavioral disorders: Secondary | ICD-10-CM | POA: Insufficient documentation

## 2013-11-16 DIAGNOSIS — K219 Gastro-esophageal reflux disease without esophagitis: Secondary | ICD-10-CM | POA: Insufficient documentation

## 2013-11-16 LAB — CBC
HCT: 35.1 % — ABNORMAL LOW (ref 36.0–46.0)
Hemoglobin: 12.3 g/dL (ref 12.0–15.0)
MCH: 29.4 pg (ref 26.0–34.0)
MCHC: 35 g/dL (ref 30.0–36.0)
MCV: 83.8 fL (ref 78.0–100.0)
RBC: 4.19 MIL/uL (ref 3.87–5.11)
RDW: 13.2 % (ref 11.5–15.5)

## 2013-11-16 LAB — COMPREHENSIVE METABOLIC PANEL
Alkaline Phosphatase: 56 U/L (ref 39–117)
BUN: 9 mg/dL (ref 6–23)
CO2: 26 mEq/L (ref 19–32)
Creatinine, Ser: 0.67 mg/dL (ref 0.50–1.10)
GFR calc Af Amer: >90 mL/min (ref >90)
Glucose, Bld: 83 mg/dL (ref 70–99)
Potassium: 4 mEq/L (ref 3.5–5.1)
Total Protein: 7.2 g/dL (ref 6.0–8.3)

## 2013-11-16 LAB — TROPONIN I: Troponin I: <0.3 ng/mL (ref <0.30)

## 2013-11-16 MED ORDER — LORAZEPAM 1 MG PO TABS: 1.0000 mg | ORAL_TABLET | Freq: Three times a day (TID) | ORAL | Status: DC | PRN

## 2013-11-16 MED ORDER — SODIUM CHLORIDE 0.9 % IV SOLN
Freq: Once | INTRAVENOUS | Status: AC
  Administered 2013-11-16: 11:00:00 via INTRAVENOUS

## 2013-11-16 MED ORDER — NITROGLYCERIN 0.4 MG SL SUBL
0.4000 mg | SUBLINGUAL_TABLET | SUBLINGUAL | Status: DC | PRN
  Administered 2013-11-16: 0.4 mg via SUBLINGUAL

## 2013-11-16 MED ORDER — ASPIRIN 81 MG PO CHEW
324.0000 mg | CHEWABLE_TABLET | Freq: Once | ORAL | Status: AC
  Administered 2013-11-16: 324 mg via ORAL

## 2013-11-16 MED ORDER — ASPIRIN 81 MG PO CHEW
CHEWABLE_TABLET | ORAL | Status: AC
  Filled 2013-11-16: qty 1

## 2013-11-16 MED ORDER — GI COCKTAIL ~~LOC~~
30.0000 mL | Freq: Once | ORAL | Status: AC
  Administered 2013-11-16: 30 mL via ORAL

## 2013-11-16 MED ORDER — NITROGLYCERIN 0.4 MG SL SUBL
SUBLINGUAL_TABLET | SUBLINGUAL | Status: AC
  Filled 2013-11-16: qty 25

## 2013-11-16 MED ORDER — SODIUM CHLORIDE 0.9 % IV SOLN: Freq: Once | INTRAVENOUS | Status: DC

## 2013-11-16 MED ORDER — GI COCKTAIL ~~LOC~~
ORAL | Status: AC
  Filled 2013-11-16: qty 30

## 2013-11-16 NOTE — ED Notes (Signed)
Chaplain spoke with pt- pt very grateful and feels much better.

## 2013-11-16 NOTE — ED Notes (Signed)
NTG Sl for chest pain, c/o pain is 5-6 on 1-10 scale

## 2013-11-16 NOTE — Progress Notes (Signed)
Chaplain paged to provide emotional support to patient. Per the nurse, the patient is experiencing anxiety. Chaplain provided ministry of presence, emotional and spiritual support. Patient said she has gone through a lot of change recently and is having a hard time adjusting to the changes. Chaplain provided emphatic listening as the patient shared her feelings. Chaplain provided spiritual support through prayer.   11/16/13 1321  Clinical Encounter Type  Visited With Patient  Visit Type Initial;Spiritual support;Social support  Referral From Patient  Spiritual Encounters  Spiritual Needs Prayer;Emotional;Grief support  Stress Factors  Patient Stress Factors Loss

## 2013-11-16 NOTE — ED Notes (Signed)
Pt reports waking up 0730 with left chest pain radiating to left arm . Pt took herself to [facility] urgent care. Upon arrival pt received 1 nitro SL, 324 ASA, 1 GI cocktail with no relief. Pt denies N/V and diaphoresis. Per report from [facility], EKG shows SR with incomplete RBBB. Vitals 128/82, HR 78

## 2013-11-16 NOTE — ED Notes (Signed)
No pain w direct pressure to chest , no change w inspiration

## 2013-11-16 NOTE — ED Notes (Signed)
Pt states she is under a lot of emotional, financial, and work stress. She recently called off her engagement and wedding. She is feeling very anxious and asks for anxiety pill. Notified MD of this. Pt also is agreeable to speaking with chaplain. Called chaplain and will come visit with patient shortly.

## 2013-11-16 NOTE — ED Provider Notes (Signed)
CSN: [id number]     Arrival date & time 11/16/13  1107 History   First MD Initiated Contact with Patient 11/16/13 1120     Chief Complaint  Patient presents with  . Chest Pain   (Consider location/radiation/quality/duration/timing/severity/associated sxs/prior Treatment) Patient is a 42 y.o. female presenting with chest pain. The history is provided by the patient.  Chest Pain Associated symptoms: no abdominal pain, no back pain, no cough, no fever, no headache, no nausea, no palpitations, no shortness of breath and not vomiting   pt c/o left shoulder and left lateral/upper chest pain for the past day. Constant. Dull. Mild-mod. No specific exacerbating or alleviating factors. At rest. No relation to activity or exertion. No change whether upright or supine. No associated nv, diaphoresis of sob. No unusual doe or fatigue. No pleuritic pain. No leg pain or swelling currently - notes recent vascular study legs earlier in month neg for dvt. No prior hx dvt or pe. No surgery, immobility, trauma, or prolonged travel. Nonsmoker, no drug use.  Denies family hx premature cad, states mother has unspecific rhythm issues w heart, no prior cabg/stent. Denies any other recent cp, no exertional cp. Denies cough or uri c/o. No fever or chills. Hx gerd.     Past Medical History  Diagnosis Date  . Esophageal stricture   . Chest pain 09/14/2009    no pulmonary embolus by chest CT  . Palpitations   . Elevated BP   . Anxiety     past Hx  . Depression     past Hx  . GERD (gastroesophageal reflux disease)     no meds  . Sickle cell trait   . Fibroid   . Abnormal Pap smear     cryo, normal since   Past Surgical History  Procedure Laterality Date  . Cesarean section  2004  . Laparoscopy for ectopic pregnancy  2001  . Wisdom tooth extraction  2012   Family History  Problem Relation Age of Onset  . Asthma Mother   . Hypertension Mother   . Hypertension Paternal Uncle   . Asthma Maternal  Grandmother    History  Substance Use Topics  . Smoking status: Never Smoker   . Smokeless tobacco: Never Used  . Alcohol Use: Yes     Comment: social   OB History   Grav Para Term Preterm Abortions TAB SAB Ect Mult Living   5 2 2  0 3 0 0 3 0 2     Review of Systems  Constitutional: Negative for fever and chills.  HENT: Negative for sore throat.   Eyes: Negative for redness.  Respiratory: Negative for cough and shortness of breath.   Cardiovascular: Positive for chest pain. Negative for palpitations and leg swelling.  Gastrointestinal: Negative for nausea, vomiting and abdominal pain.  Genitourinary: Negative for flank pain.  Musculoskeletal: Negative for back pain and neck pain.  Skin: Negative for rash.  Neurological: Negative for headaches.  Hematological: Does not bruise/bleed easily.  Psychiatric/Behavioral: Negative for confusion.    Allergies  Diphenhydramine hcl; Hydromorphone hcl; and Morphine  Home Medications   Current Outpatient Rx  Name  Route  Sig  Dispense  Refill  . furosemide (LASIX) 20 MG tablet   Oral   Take 1 tablet (20 mg total) by mouth daily.   30 tablet   0   . pantoprazole (PROTONIX) 20 MG tablet   Oral   Take 1 tablet (20 mg total) by mouth daily.   15 tablet  0    BP 105/74  Pulse 70  Temp(Src) 99.1 F (37.3 C) (Oral)  Resp 12  SpO2 100%  LMP 10/30/2013 Physical Exam  Nursing note and vitals reviewed. Constitutional: She appears well-developed and well-nourished. No distress.  HENT:  Mouth/Throat: Oropharynx is clear and moist.  Eyes: Conjunctivae are normal. No scleral icterus.  Neck: Neck supple. No tracheal deviation present.  Cardiovascular: Normal rate, regular rhythm, normal heart sounds and intact distal pulses.  Exam reveals no gallop and no friction rub.   No murmur heard. Pulmonary/Chest: Effort normal and breath sounds normal. No respiratory distress. She exhibits tenderness.  Abdominal: Normal appearance. She  exhibits no distension.  Musculoskeletal: She exhibits no edema and no tenderness.  Neurological: She is alert.  Skin: Skin is warm and dry. No rash noted.  Psychiatric: She has a normal mood and affect.    ED Course  Procedures (including critical care time)   Results for orders placed during the hospital encounter of 11/16/13  CBC      Result Value Range   WBC 3.9 (*) 4.0 - 10.5 K/uL   RBC 4.19  3.87 - 5.11 MIL/uL   Hemoglobin 12.3  12.0 - 15.0 g/dL   HCT 40.9 (*) 81.1 - 91.4 %   MCV 83.8  78.0 - 100.0 fL   MCH 29.4  26.0 - 34.0 pg   MCHC 35.0  30.0 - 36.0 g/dL   RDW 78.2  95.6 - 21.3 %   Platelets 315  150 - 400 K/uL  COMPREHENSIVE METABOLIC PANEL      Result Value Range   Sodium 138  135 - 145 mEq/L   Potassium 4.0  3.5 - 5.1 mEq/L   Chloride 104  96 - 112 mEq/L   CO2 26  19 - 32 mEq/L   Glucose, Bld 83  70 - 99 mg/dL   BUN 9  6 - 23 mg/dL   Creatinine, Ser 0.86  0.50 - 1.10 mg/dL   Calcium 8.6  8.4 - 57.8 mg/dL   Total Protein 7.2  6.0 - 8.3 g/dL   Albumin 3.2 (*) 3.5 - 5.2 g/dL   AST 12  0 - 37 U/L   ALT 7  0 - 35 U/L   Alkaline Phosphatase 56  39 - 117 U/L   Total Bilirubin 0.5  0.3 - 1.2 mg/dL   GFR calc non Af Amer >90  >90 mL/min   GFR calc Af Amer >90  >90 mL/min  TROPONIN I      Result Value Range   Troponin I <0.30  <0.30 ng/mL   Dg Chest 2 View  11/16/2013   CLINICAL DATA:  Chest pain.  EXAM: CHEST  2 VIEW  COMPARISON:  Chest x-ray 10/29/2013.  FINDINGS: Lung volumes are normal. No consolidative airspace disease. No pleural effusions. No pneumothorax. No pulmonary nodule or mass noted. Pulmonary vasculature and the cardiomediastinal silhouette are within normal limits.  IMPRESSION: 1.  No radiographic evidence of acute cardiopulmonary disease.   Electronically Signed   By: Trudie Reed M.D.   On: 11/16/2013 12:10   Dg Chest 2 View  10/29/2013   CLINICAL DATA:  Chest pain.  EXAM: CHEST  2 VIEW  COMPARISON:  Apr 20, 2013.  FINDINGS: The heart size and  mediastinal contours are within normal limits. Both lungs are clear. No pleural effusion or pneumothorax is noted. The visualized skeletal structures are unremarkable.  IMPRESSION: No acute cardiopulmonary abnormality seen.   Electronically Signed  By: Roque Lias M.D.   On: 10/29/2013 08:00   US Venous Img Lower Unilateral Left  11/08/2013   CLINICAL DATA:  Left calf pain and swelling  EXAM: LEFT LOWER EXTREMITY VENOUS DUPLEX ULTRASOUND  TECHNIQUE: Gray-scale sonography with graded compression, as well as color Doppler and duplex ultrasound, were performed to evaluate the deep venous system from the level of the common femoral vein through the popliteal and proximal calf veins. Spectral Doppler was utilized to evaluate flow at rest and with distal augmentation maneuvers.  COMPARISON:  May 01, 2012  FINDINGS: Flow in the venous structures of the left lower extremity is spontaneous and phasic in all segments. There is normal compression and augmentation in the venous structures of the left lower extremity. Venous Doppler signal is normal in all regions. There is no thrombus in the deep or visualized superficial venous structures on the left. There is no left lower extremity deep venous incompetence.  IMPRESSION: No evidence of left lower extremity deep venous thrombosis.   Electronically Signed   By: Bretta Bang M.D.   On: 11/08/2013 12:15     EKG Interpretation   None      Date: 11/16/2013  Rate: 71  Rhythm: normal sinus rhythm  QRS Axis: normal  Intervals: normal  ST/T Wave abnormalities: normal  Conduction Disutrbances:none  Narrative Interpretation:   Old EKG Reviewed: unchanged    MDM  Iv ns. Labs. chest X-ray.  Reviewed nursing notes and prior charts for additional history.   Reviewed prior charts/studies - several prior evals for atypical cp.  Several prior ct angio chests neg for PE or acute process.  Recent lower ext doppler neg.   After symptoms for one days time,  constant, trop neg.  Feel current symptoms not c/w acs.  On recheck pt comfortable. Notes to nurse, and subsequently to me, feels under a lot of stress lately, notes hx anxiety, and requests med for same. Denies depression.   Pt drove self to ED, therefore no sedating meds given in ed, will give rx for home.      Suzi Roots, MD 11/16/13 1340

## 2013-11-16 NOTE — ED Provider Notes (Signed)
Stacy [name] is a 42 y.o. female who presents to Urgent Care today for central crushing chest pain. Patient was woken from sleep this morning with central crushing chest pain radiating to the left arm. She denies any significant shortness of breath. She does note some nausea. She denies any palpitations. She has not tried any medications yet. She's not sure if the pain is exertional.  Of note patient was recently evaluated for DVT with a negative Doppler scan on December 2.   Past Medical History  Diagnosis Date  . Esophageal stricture   . Chest pain 09/14/2009    no pulmonary embolus by chest CT  . Palpitations   . Elevated BP   . Anxiety     past Hx  . Depression     past Hx  . GERD (gastroesophageal reflux disease)     no meds  . Sickle cell trait   . Fibroid   . Abnormal Pap smear     cryo, normal since   History  Substance Use Topics  . Smoking status: Never Smoker   . Smokeless tobacco: Never Used  . Alcohol Use: Yes     Comment: social   ROS as above Medications reviewed. Current Facility-Administered Medications  Medication Dose Route Frequency Provider Last Rate Last Dose  . 0.9 %  sodium chloride infusion   Intravenous Once Rodolph Bong, MD      . aspirin chewable tablet 324 mg  324 mg Oral Once Rodolph Bong, MD      . nitroGLYCERIN (NITROSTAT) SL tablet 0.4 mg  0.4 mg Sublingual Q5 min PRN Rodolph Bong, MD       Current Outpatient Prescriptions  Medication Sig Dispense Refill  . furosemide (LASIX) 20 MG tablet Take 1 tablet (20 mg total) by mouth daily.  30 tablet  0  . pantoprazole (PROTONIX) 20 MG tablet Take 1 tablet (20 mg total) by mouth daily.  15 tablet  0    Exam:  BP 125/85  Pulse 84  Temp(Src) 98 F (36.7 C) (Oral)  Resp 18  SpO2 98%  LMP 10/30/2013 Gen: Well NAD HEENT:   MMM  Lungs: Normal work of breathing. CTABL Heart: RRR no MRG Abd: NABS, Soft. NT, ND Exts: , warm and well perfused.   Twelve-lead EKG shows normal sinus rhythm at  64 beats per minute. Possible incomplete right bundle branch block. No ST segment elevation or depression. No Q waves. Not much change from prior EKG  Assessment and Plan: 42 y.o. female with concerning chest pain. Chest pain is anginal in nature. Plan to start IV antibiotics shin. Additionally we'll give nitroglycerin as well as aspirin. We'll transfer patient to the emergency room via EMS. Discussed warning signs or symptoms. Please see discharge instructions. Patient expresses understanding.      Rodolph Bong, MD 11/16/13 [phone]

## 2013-11-16 NOTE — ED Notes (Signed)
Pt c/o constant chest pain onset 0730 today w/left arm numbness Pain can not be produced... Hx of GERD Sxs also include: nauseas Denies: f/v/n/d, weakness, diaphoresis, inj/trauma Alert w/no signs of acute distress.

## 2014-01-31 ENCOUNTER — Emergency Department (HOSPITAL_BASED_OUTPATIENT_CLINIC_OR_DEPARTMENT_OTHER): Payer: 59

## 2014-01-31 ENCOUNTER — Emergency Department (HOSPITAL_BASED_OUTPATIENT_CLINIC_OR_DEPARTMENT_OTHER)
Admission: EM | Admit: 2014-01-31 | Discharge: 2014-01-31 | Disposition: A | Discharge status: Home / Self Care | Payer: 59 | Attending: Emergency Medicine | Admitting: Emergency Medicine

## 2014-01-31 ENCOUNTER — Encounter (HOSPITAL_BASED_OUTPATIENT_CLINIC_OR_DEPARTMENT_OTHER): Payer: Self-pay | Admitting: Emergency Medicine

## 2014-01-31 DIAGNOSIS — F329 Major depressive disorder, single episode, unspecified: Secondary | ICD-10-CM | POA: Insufficient documentation

## 2014-01-31 DIAGNOSIS — F3289 Other specified depressive episodes: Secondary | ICD-10-CM | POA: Insufficient documentation

## 2014-01-31 DIAGNOSIS — R209 Unspecified disturbances of skin sensation: Secondary | ICD-10-CM | POA: Insufficient documentation

## 2014-01-31 DIAGNOSIS — F411 Generalized anxiety disorder: Secondary | ICD-10-CM | POA: Insufficient documentation

## 2014-01-31 DIAGNOSIS — M79601 Pain in right arm: Secondary | ICD-10-CM

## 2014-01-31 DIAGNOSIS — M79609 Pain in unspecified limb: Secondary | ICD-10-CM | POA: Insufficient documentation

## 2014-01-31 DIAGNOSIS — Z79899 Other long term (current) drug therapy: Secondary | ICD-10-CM | POA: Insufficient documentation

## 2014-01-31 DIAGNOSIS — Z862 Personal history of diseases of the blood and blood-forming organs and certain disorders involving the immune mechanism: Secondary | ICD-10-CM | POA: Insufficient documentation

## 2014-01-31 DIAGNOSIS — K219 Gastro-esophageal reflux disease without esophagitis: Secondary | ICD-10-CM | POA: Insufficient documentation

## 2014-01-31 LAB — COMPREHENSIVE METABOLIC PANEL
ALT: 10 U/L (ref 0–35)
AST: 14 U/L (ref 0–37)
Albumin: 3.6 g/dL (ref 3.5–5.2)
Alkaline Phosphatase: 58 U/L (ref 39–117)
BUN: 7 mg/dL (ref 6–23)
CO2: 26 mEq/L (ref 19–32)
Calcium: 9 mg/dL (ref 8.4–10.5)
Chloride: 102 mEq/L (ref 96–112)
Creatinine, Ser: 0.7 mg/dL (ref 0.50–1.10)
GFR calc non Af Amer: >90 mL/min (ref >90)
GLUCOSE: 91 mg/dL (ref 70–99)
Potassium: 3.8 mEq/L (ref 3.7–5.3)
SODIUM: 141 meq/L (ref 137–147)
TOTAL PROTEIN: 7.8 g/dL (ref 6.0–8.3)
Total Bilirubin: 0.3 mg/dL (ref 0.3–1.2)

## 2014-01-31 LAB — CBC WITH DIFFERENTIAL/PLATELET
Basophils Absolute: 0 10*3/uL (ref 0.0–0.1)
Basophils Relative: 0 % (ref 0–1)
EOS ABS: 0.2 10*3/uL (ref 0.0–0.7)
EOS PCT: 3 % (ref 0–5)
HEMATOCRIT: 36.3 % (ref 36.0–46.0)
Hemoglobin: 12.9 g/dL (ref 12.0–15.0)
Lymphocytes Relative: 33 % (ref 12–46)
Lymphs Abs: 1.8 10*3/uL (ref 0.7–4.0)
MCH: 29.4 pg (ref 26.0–34.0)
MCHC: 35.5 g/dL (ref 30.0–36.0)
MCV: 82.7 fL (ref 78.0–100.0)
Monocytes Absolute: 0.6 10*3/uL (ref 0.1–1.0)
Monocytes Relative: 10 % (ref 3–12)
Neutro Abs: 3 10*3/uL (ref 1.7–7.7)
Neutrophils Relative %: 54 % (ref 43–77)
PLATELETS: 354 10*3/uL (ref 150–400)
RBC: 4.39 MIL/uL (ref 3.87–5.11)
RDW: 12.4 % (ref 11.5–15.5)
WBC: 5.5 10*3/uL (ref 4.0–10.5)

## 2014-01-31 LAB — LIPASE, BLOOD: Lipase: 19 U/L (ref 11–59)

## 2014-01-31 LAB — TROPONIN I: Troponin I: <0.3 ng/mL (ref <0.30)

## 2014-01-31 MED ORDER — ACETAMINOPHEN 325 MG PO TABS
650.0000 mg | ORAL_TABLET | Freq: Once | ORAL | Status: AC
  Administered 2014-01-31: 650 mg via ORAL
  Filled 2014-01-31: qty 2

## 2014-01-31 MED ORDER — ALPRAZOLAM 0.5 MG PO TABS: 0.5000 mg | ORAL_TABLET | Freq: Every evening | ORAL | Status: DC | PRN

## 2014-01-31 MED ORDER — KETOROLAC TROMETHAMINE 60 MG/2ML IM SOLN
60.0000 mg | Freq: Once | INTRAMUSCULAR | Status: DC
  Filled 2014-01-31: qty 2

## 2014-01-31 NOTE — ED Notes (Addendum)
Pt refused injection-"can't i just have some tylenol"-EDNP notified

## 2014-01-31 NOTE — ED Provider Notes (Signed)
Patient seen/examined in the Emergency Department in conjunction with Midlevel Provider [place] Patient reports right UE pain Exam : awake/alert, no facial droop, no ptosis, no nystagmus, no arm drift, equal hand grips Plan: well appearing, stable for d/c home   EKG Interpretation  Date/Time:  Friday January 31 2014 12:38:13 EST Ventricular Rate:  79 PR Interval:  138 QRS Duration: 92 QT Interval:  372 QTC Calculation: 426 R Axis:   69 Text Interpretation:  Normal sinus rhythm Incomplete right bundle branch block Possible Anteroseptal infarct , age undetermined Abnormal ECG No significant change since last tracing Confirmed by Christy Gentles  MD, Janijah Symons ([id number]) on 01/31/2014 12:53:02 PM         Sharyon Cable, MD 01/31/14 1400

## 2014-01-31 NOTE — ED Provider Notes (Signed)
CSN: [id number]     Arrival date & time 01/31/14  1150 History   First MD Initiated Contact with Patient 01/31/14 1208     Chief Complaint  Patient presents with  . Arm Pain     (Consider location/radiation/quality/duration/timing/severity/associated sxs/prior Treatment) Patient is a 43 y.o. female presenting with arm pain. The history is provided by the patient. No language interpreter was used.  Arm Pain The current episode started today. The problem occurs constantly. Associated symptoms include arthralgias and numbness. Pertinent negatives include no abdominal pain, chest pain, chills, coughing, fever, headaches, neck pain or weakness.   Pt presents with c/o right shoulder pain with aching and numbness extended into her right hand. She reports that she was sitting in a meeting at work this morning and started having an aching sensation in her right arm. She denies back pain, chest pain, abdominal pain or shortness of breath. She denies any recent URI, cough or respiratory illness. She denies weakness, dizziness or confusion. She is not having any difficulty ambulating. She has a history of GERD, multiple visits to the ER for atypical chest pain and depression and anxiety. She denies any history of known injury to her neck, back or shoulder. She also complains of a brief period of blurriness in her right eye. No other visual disturbances, neck pain, neck stiffness or headache.   Past Medical History  Diagnosis Date  . Esophageal stricture   . Chest pain 09/14/2009    no pulmonary embolus by chest CT  . Palpitations   . Elevated BP   . Anxiety     past Hx  . Depression     past Hx  . GERD (gastroesophageal reflux disease)     no meds  . Sickle cell trait   . Fibroid   . Abnormal Pap smear     cryo, normal since   Past Surgical History  Procedure Laterality Date  . Cesarean section  2004  . Laparoscopy for ectopic pregnancy  2001  . Wisdom tooth extraction  2012   Family  History  Problem Relation Age of Onset  . Asthma Mother   . Hypertension Mother   . Hypertension Paternal Uncle   . Asthma Maternal Grandmother    History  Substance Use Topics  . Smoking status: Never Smoker   . Smokeless tobacco: Never Used  . Alcohol Use: Yes     Comment: social   OB History   Grav Para Term Preterm Abortions TAB SAB Ect Mult Living   5 2 2  0 3 0 0 3 0 2     Review of Systems  Constitutional: Negative for fever and chills.  Eyes: Negative for photophobia, pain and visual disturbance.  Respiratory: Negative for cough.   Cardiovascular: Negative for chest pain.  Gastrointestinal: Negative for abdominal pain.  Musculoskeletal: Positive for arthralgias. Negative for back pain, neck pain and neck stiffness.  Neurological: Positive for numbness. Negative for dizziness, tremors, seizures, facial asymmetry, speech difficulty, weakness, light-headedness and headaches.  All other systems reviewed and are negative.      Allergies  Diphenhydramine hcl; Hydromorphone hcl; and Morphine  Home Medications   Current Outpatient Rx  Name  Route  Sig  Dispense  Refill  . Esomeprazole Magnesium (NEXIUM PO)   Oral   Take by mouth.         . furosemide (LASIX) 20 MG tablet   Oral   Take 1 tablet (20 mg total) by mouth daily.   30 tablet  0   . LORazepam (ATIVAN) 1 MG tablet   Oral   Take 1 tablet (1 mg total) by mouth 3 (three) times daily as needed for anxiety.   15 tablet   0   . pantoprazole (PROTONIX) 20 MG tablet   Oral   Take 1 tablet (20 mg total) by mouth daily.   15 tablet   0    BP 130/87  Pulse 78  Temp(Src) 98.4 F (36.9 C) (Oral)  Resp 16  Ht 5\' 5"  (1.651 m)  Wt 203 lb (92.08 kg)  BMI 33.78 kg/m2  SpO2 100%  LMP 01/25/2014 Physical Exam  Nursing note and vitals reviewed. Constitutional: She is oriented to person, place, and time. She appears well-developed and well-nourished.  Well-appearing  HENT:  Head: Normocephalic and  atraumatic.  Left Ear: External ear normal.  Mouth/Throat: Oropharynx is clear and moist.  Eyes: Conjunctivae and EOM are normal. Pupils are equal, round, and reactive to light.  Neck: Normal range of motion. Neck supple. No JVD present. No tracheal deviation present. No thyromegaly present.  Cardiovascular: Normal rate, regular rhythm, normal heart sounds and intact distal pulses.   Pulmonary/Chest: Effort normal and breath sounds normal. No respiratory distress. She has no wheezes.  Abdominal: Soft. Bowel sounds are normal. She exhibits no distension. There is no tenderness.  Musculoskeletal: Normal range of motion.  Normal ROM of right shoulder. Aching and numbness extending into fingertips. Strong, equal grips. Good sensation, distal pulses 2+, capillary refill brisk. C-spine; no midline spinal tenderness or deformity noted.  Lymphadenopathy:    She has no cervical adenopathy.  Neurological: She is alert and oriented to person, place, and time. She has normal strength. No cranial nerve deficit or sensory deficit. She displays a negative Romberg sign. Coordination and gait normal.  Skin: Skin is warm and dry.  Psychiatric: She has a normal mood and affect. Her behavior is normal. Judgment and thought content normal.    ED Course  Procedures (including critical care time) Labs Review Labs Reviewed - No data to display Imaging Review No results found.   EKG Interpretation None      MDM   Final diagnoses:  Right arm pain   EKG within normal limits, no c/o chest pain. Denies shortness of breath, fever or recent illness. Lab work unremarkable, negative troponin. Chest xray within normal. May be radicular type pain. Discussed plan with pt and she agrees. Has been under more stress recently at work and home. Prescription for xanax PRN to take at bedtime as needed. Follow-up with PCP.       Elisha Headland, NP 02/04/14 1726

## 2014-01-31 NOTE — Discharge Instructions (Signed)
Musculoskeletal Pain Musculoskeletal pain is muscle and boney aches and pains. These pains can occur in any part of the body. Your caregiver may treat you without knowing the cause of the pain. They may treat you if blood or urine tests, X-rays, and other tests were normal.  CAUSES There is often not a definite cause or reason for these pains. These pains may be caused by a type of germ (virus). The discomfort may also come from overuse. Overuse includes working out too hard when your body is not fit. Boney aches also come from weather changes. Bone is sensitive to atmospheric pressure changes. HOME CARE INSTRUCTIONS   Ask when your test results will be ready. Make sure you get your test results.  Only take over-the-counter or prescription medicines for pain, discomfort, or fever as directed by your caregiver. If you were given medications for your condition, do not drive, operate machinery or power tools, or sign legal documents for 24 hours. Do not drink alcohol. Do not take sleeping pills or other medications that may interfere with treatment.  Continue all activities unless the activities cause more pain. When the pain lessens, slowly resume normal activities. Gradually increase the intensity and duration of the activities or exercise.  During periods of severe pain, bed rest may be helpful. Lay or sit in any position that is comfortable.  Putting ice on the injured area.  Put ice in a bag.  Place a towel between your skin and the bag.  Leave the ice on for 15 to 20 minutes, 3 to 4 times a day.  Follow up with your caregiver for continued problems and no reason can be found for the pain. If the pain becomes worse or does not go away, it may be necessary to repeat tests or do additional testing. Your caregiver may need to look further for a possible cause. SEEK IMMEDIATE MEDICAL CARE IF:  You have pain that is getting worse and is not relieved by medications.  You develop chest pain  that is associated with shortness or breath, sweating, feeling sick to your stomach (nauseous), or throw up (vomit).  Your pain becomes localized to the abdomen.  You develop any new symptoms that seem different or that concern you. MAKE SURE YOU:   Understand these instructions.  Will watch your condition.  Will get help right away if you are not doing well or get worse. Document Released: 11/14/2005 Document Revised: 02/06/2012 Document Reviewed: 07/19/2013 [facility] Patient Information 2014 [place].   Ibuprofen for discomfort Heat therapy to shoulder  Take xanax as needed for anxiety

## 2014-01-31 NOTE — ED Notes (Signed)
C/o pain to entire right arm and shoulder-started while in seated meeting at working aprpox 100am-also c/o blurred vision to right eye

## 2014-02-05 NOTE — ED Provider Notes (Signed)
Medical screening examination/treatment/procedure(s) were conducted as a shared visit with non-physician practitioner(s) and myself.  I personally evaluated the patient during the encounter.   EKG Interpretation   Date/Time:  Friday January 31 2014 12:38:13 EST Ventricular Rate:  79 PR Interval:  138 QRS Duration: 92 QT Interval:  372 QTC Calculation: 426 R Axis:   69 Text Interpretation:  Normal sinus rhythm Incomplete right bundle branch  block Possible Anteroseptal infarct , age undetermined Abnormal ECG No  significant change since last tracing Confirmed by Christy Gentles  MD, Carless Slatten  [phone]) on 01/31/2014 12:53:02 PM        Sharyon Cable, MD 02/05/14 1523

## 2014-02-20 ENCOUNTER — Emergency Department (HOSPITAL_COMMUNITY)
Admission: EM | Admit: 2014-02-20 | Discharge: 2014-02-21 | Disposition: A | Discharge status: Home / Self Care | Payer: 59 | Attending: Emergency Medicine | Admitting: Emergency Medicine

## 2014-02-20 ENCOUNTER — Encounter (HOSPITAL_COMMUNITY): Payer: Self-pay | Admitting: Emergency Medicine

## 2014-02-20 DIAGNOSIS — F411 Generalized anxiety disorder: Secondary | ICD-10-CM | POA: Insufficient documentation

## 2014-02-20 DIAGNOSIS — K219 Gastro-esophageal reflux disease without esophagitis: Secondary | ICD-10-CM | POA: Insufficient documentation

## 2014-02-20 DIAGNOSIS — F3289 Other specified depressive episodes: Secondary | ICD-10-CM | POA: Insufficient documentation

## 2014-02-20 DIAGNOSIS — F329 Major depressive disorder, single episode, unspecified: Secondary | ICD-10-CM | POA: Insufficient documentation

## 2014-02-20 DIAGNOSIS — Z862 Personal history of diseases of the blood and blood-forming organs and certain disorders involving the immune mechanism: Secondary | ICD-10-CM | POA: Insufficient documentation

## 2014-02-20 DIAGNOSIS — R0789 Other chest pain: Secondary | ICD-10-CM | POA: Insufficient documentation

## 2014-02-20 DIAGNOSIS — R42 Dizziness and giddiness: Secondary | ICD-10-CM | POA: Insufficient documentation

## 2014-02-20 DIAGNOSIS — Z8742 Personal history of other diseases of the female genital tract: Secondary | ICD-10-CM | POA: Insufficient documentation

## 2014-02-20 DIAGNOSIS — Z79899 Other long term (current) drug therapy: Secondary | ICD-10-CM | POA: Insufficient documentation

## 2014-02-20 LAB — BASIC METABOLIC PANEL
BUN: 9 mg/dL (ref 6–23)
CHLORIDE: 102 meq/L (ref 96–112)
CO2: 24 meq/L (ref 19–32)
CREATININE: 0.74 mg/dL (ref 0.50–1.10)
Calcium: 9 mg/dL (ref 8.4–10.5)
GFR calc non Af Amer: >90 mL/min (ref >90)
Glucose, Bld: 92 mg/dL (ref 70–99)
Potassium: 3.7 mEq/L (ref 3.7–5.3)
SODIUM: 139 meq/L (ref 137–147)

## 2014-02-20 LAB — CBC
HCT: 35.2 % — ABNORMAL LOW (ref 36.0–46.0)
Hemoglobin: 13 g/dL (ref 12.0–15.0)
MCH: 29.8 pg (ref 26.0–34.0)
MCHC: 36.9 g/dL — ABNORMAL HIGH (ref 30.0–36.0)
MCV: 80.7 fL (ref 78.0–100.0)
PLATELETS: 319 10*3/uL (ref 150–400)
RBC: 4.36 MIL/uL (ref 3.87–5.11)
RDW: 12.5 % (ref 11.5–15.5)
WBC: 6.9 10*3/uL (ref 4.0–10.5)

## 2014-02-20 LAB — I-STAT TROPONIN, ED: TROPONIN I, POC: 0 ng/mL (ref 0.00–0.08)

## 2014-02-20 NOTE — ED Notes (Signed)
Pt. reports mid chest pain with SOB and nausea onset 5 pm today .

## 2014-02-21 ENCOUNTER — Emergency Department (HOSPITAL_COMMUNITY): Payer: 59

## 2014-02-21 LAB — PRO B NATRIURETIC PEPTIDE: PRO B NATRI PEPTIDE: 17.3 pg/mL (ref 0–125)

## 2014-02-21 MED ORDER — GI COCKTAIL ~~LOC~~
30.0000 mL | Freq: Once | ORAL | Status: AC
  Administered 2014-02-21: 30 mL via ORAL
  Filled 2014-02-21: qty 30

## 2014-02-21 NOTE — ED Notes (Signed)
Maryan Rued, MD at bedside.

## 2014-02-21 NOTE — Discharge Instructions (Signed)
Chest Pain (Nonspecific) °It is often hard to give a specific diagnosis for the cause of chest pain. There is always a chance that your pain could be related to something serious, such as a heart attack or a blood clot in the lungs. You need to follow up with your caregiver for further evaluation. °CAUSES  °· Heartburn. °· Pneumonia or bronchitis. °· Anxiety or stress. °· Inflammation around your heart (pericarditis) or lung (pleuritis or pleurisy). °· A blood clot in the lung. °· A collapsed lung (pneumothorax). It can develop suddenly on its own (spontaneous pneumothorax) or from injury (trauma) to the chest. °· Shingles infection (herpes zoster virus). °The chest wall is composed of bones, muscles, and cartilage. Any of these can be the source of the pain. °· The bones can be bruised by injury. °· The muscles or cartilage can be strained by coughing or overwork. °· The cartilage can be affected by inflammation and become sore (costochondritis). °DIAGNOSIS  °Lab tests or other studies, such as X-rays, electrocardiography, stress testing, or cardiac imaging, may be needed to find the cause of your pain.  °TREATMENT  °· Treatment depends on what may be causing your chest pain. Treatment may include: °· Acid blockers for heartburn. °· Anti-inflammatory medicine. °· Pain medicine for inflammatory conditions. °· Antibiotics if an infection is present. °· You may be advised to change lifestyle habits. This includes stopping smoking and avoiding alcohol, caffeine, and chocolate. °· You may be advised to keep your head raised (elevated) when sleeping. This reduces the chance of acid going backward from your stomach into your esophagus. °· Most of the time, nonspecific chest pain will improve within 2 to 3 days with rest and mild pain medicine. °HOME CARE INSTRUCTIONS  °· If antibiotics were prescribed, take your antibiotics as directed. Finish them even if you start to feel better. °· For the next few days, avoid physical  activities that bring on chest pain. Continue physical activities as directed. °· Do not smoke. °· Avoid drinking alcohol. °· Only take over-the-counter or prescription medicine for pain, discomfort, or fever as directed by your caregiver. °· Follow your caregiver's suggestions for further testing if your chest pain does not go away. °· Keep any follow-up appointments you made. If you do not go to an appointment, you could develop lasting (chronic) problems with pain. If there is any problem keeping an appointment, you must call to reschedule. °SEEK MEDICAL CARE IF:  °· You think you are having problems from the medicine you are taking. Read your medicine instructions carefully. °· Your chest pain does not go away, even after treatment. °· You develop a rash with blisters on your chest. °SEEK IMMEDIATE MEDICAL CARE IF:  °· You have increased chest pain or pain that spreads to your arm, neck, jaw, back, or abdomen. °· You develop shortness of breath, an increasing cough, or you are coughing up blood. °· You have severe back or abdominal pain, feel nauseous, or vomit. °· You develop severe weakness, fainting, or chills. °· You have a fever. °THIS IS AN EMERGENCY. Do not wait to see if the pain will go away. Get medical help at once. Call your local emergency services (911 in [place] Do not drive yourself to the hospital. °MAKE SURE YOU:  °· Understand these instructions. °· Will watch your condition. °· Will get help right away if you are not doing well or get worse. °Document Released: 08/24/2005 Document Revised: 02/06/2012 Document Reviewed: 06/19/2008 °[employer]® Patient Information ©2014 [employer]. ° °

## 2014-02-21 NOTE — ED Provider Notes (Signed)
CSN: [id number]     Arrival date & time 02/20/14  2300 History   First MD Initiated Contact with Patient 02/21/14 0037     Chief Complaint  Patient presents with  . Chest Pain     (Consider location/radiation/quality/duration/timing/severity/associated sxs/prior Treatment) Patient is a 43 y.o. female presenting with chest pain. The history is provided by the patient.  Chest Pain Pain location:  Substernal area Pain quality: dull, pressure and tightness   Pain radiates to:  R shoulder and R arm Pain radiates to the back: no   Pain severity:  Moderate Onset quality:  Gradual Duration:  6 hours Timing:  Constant Progression:  Partially resolved Chronicity:  Recurrent Context comment:  Started after a stressful day at work Relieved by:  Nothing Worsened by:  Deep breathing Ineffective treatments:  Antacids and aspirin Associated symptoms: dizziness and nausea   Associated symptoms: no abdominal pain, no anxiety, no back pain, no cough, no diaphoresis, no fever, no shortness of breath and not vomiting   Risk factors: no birth control, no coronary artery disease, no diabetes mellitus, no hypertension, no prior DVT/PE and no smoking   Risk factors comment:  Has appt to see cardiology at [place] next week   Past Medical History  Diagnosis Date  . Esophageal stricture   . Chest pain 09/14/2009    no pulmonary embolus by chest CT  . Palpitations   . Elevated BP   . Anxiety     past Hx  . Depression     past Hx  . GERD (gastroesophageal reflux disease)     no meds  . Sickle cell trait   . Fibroid   . Abnormal Pap smear     cryo, normal since   Past Surgical History  Procedure Laterality Date  . Cesarean section  2004  . Laparoscopy for ectopic pregnancy  2001  . Wisdom tooth extraction  2012   Family History  Problem Relation Age of Onset  . Asthma Mother   . Hypertension Mother   . Hypertension Paternal Uncle   . Asthma Maternal Grandmother    History    Substance Use Topics  . Smoking status: Never Smoker   . Smokeless tobacco: Never Used  . Alcohol Use: Yes     Comment: social   OB History   Grav Para Term Preterm Abortions TAB SAB Ect Mult Living   5 2 2  0 3 0 0 3 0 2     Review of Systems  Constitutional: Negative for fever and diaphoresis.  Respiratory: Negative for cough and shortness of breath.   Cardiovascular: Positive for chest pain.  Gastrointestinal: Positive for nausea. Negative for vomiting and abdominal pain.  Musculoskeletal: Negative for back pain.  Neurological: Positive for dizziness.  All other systems reviewed and are negative.      Allergies  Diphenhydramine hcl; Hydromorphone hcl; and Morphine  Home Medications   Current Outpatient Rx  Name  Route  Sig  Dispense  Refill  . ALPRAZolam (XANAX) 0.5 MG tablet   Oral   Take 1 tablet (0.5 mg total) by mouth at bedtime as needed for anxiety.   30 tablet   0   . ranitidine (ZANTAC) 150 MG tablet   Oral   Take 150 mg by mouth 2 (two) times daily.          BP 112/66  Pulse 43  Temp(Src) 98.2 F (36.8 C) (Oral)  Resp 20  SpO2 92%  LMP 01/29/2014 Physical Exam  Nursing note and vitals reviewed. Constitutional: She is oriented to person, place, and time. She appears well-developed and well-nourished. No distress.  HENT:  Head: Normocephalic and atraumatic.  Mouth/Throat: Oropharynx is clear and moist.  Eyes: Conjunctivae and EOM are normal. Pupils are equal, round, and reactive to light.  Neck: Normal range of motion. Neck supple.  Cardiovascular: Normal rate, regular rhythm and intact distal pulses.   No murmur heard. 2= radial pulse bilaterally  Pulmonary/Chest: Effort normal and breath sounds normal. No respiratory distress. She has no wheezes. She has no rales. She exhibits tenderness.    Abdominal: Soft. She exhibits no distension. There is no tenderness. There is no rebound and no guarding.  Musculoskeletal: Normal range of motion.  She exhibits no edema and no tenderness.  Neurological: She is alert and oriented to person, place, and time.  Skin: Skin is warm and dry. No rash noted. No erythema.  Psychiatric: She has a normal mood and affect. Her behavior is normal.    ED Course  Procedures (including critical care time) Labs Review Labs Reviewed  CBC - Abnormal; Notable for the following:    HCT 35.2 (*)    MCHC 36.9 (*)    All other components within normal limits  BASIC METABOLIC PANEL  PRO B NATRIURETIC PEPTIDE  I-STAT TROPOININ, ED   Imaging Review Dg Chest 2 View  02/21/2014   CLINICAL DATA:  Chest pain.  Fatigue.  Weakness.  EXAM: CHEST  2 VIEW  COMPARISON:  Chest x-ray 01/31/2014.  FINDINGS: Lung volumes are normal. No consolidative airspace disease. No pleural effusions. No pneumothorax. No pulmonary nodule or mass noted. Pulmonary vasculature and the cardiomediastinal silhouette are within normal limits.  IMPRESSION: 1.  No radiographic evidence of acute cardiopulmonary disease.   Electronically Signed   By: Vinnie Langton M.D.   On: 02/21/2014 00:57     EKG Interpretation   Date/Time:  Thursday February 20 2014 23:06:52 EDT Ventricular Rate:  84 PR Interval:  140 QRS Duration: 92 QT Interval:  354 QTC Calculation: 418 R Axis:   48 Text Interpretation:  Normal sinus rhythm Incomplete right bundle branch  block No significant change since last tracing Confirmed by Maryan Rued  MD,  Loree Fee ([id number]) on 02/21/2014 12:15:00 AM      MDM   Final diagnoses:  Atypical chest pain    Patient presenting with a history of chest pain that started at 5 PM today causing nausea and going down her right arm. It has been ongoing constantly now since 5 PM which is about 6 hours. She denies shortness of breath or vomiting. No diaphoresis. She states it was a very stressful day at work today prior to the pain starting. She attempted to take antacid and aspirin at home with only minimal improvement. Patient has a  history of similar symptoms and has been evaluated several times in the emergency room and is actually scheduled to see cardiology next week for further testing. Her only risk factor is family history of MIs in the late 65s and 21s and her parents. She has no history of hypertension, diabetes, hyperlipidemia. She does not use tobacco products. She has perked negative.  EKG is unchanged from prior with an incomplete right bundle branch block. Initial troponin done at 6 hours post start of pain was normal.  CBC, BMP are within normal limits without signs of electrolyte abnormality or anemia.  CXR wnl.  Pt also has hx of esphogeal issues and GERD.  Will give GI  cocktail to see if any improvement.  2:13 AM Pt not improved after GI cocktail but o/w labs and imaging wnl.  Will d/c home to f/u with cards  Heart score 0    Blanchie Dessert, MD 02/21/14 [phone]

## 2014-02-26 ENCOUNTER — Encounter: Payer: Self-pay | Admitting: Cardiology

## 2014-02-26 ENCOUNTER — Institutional Professional Consult (permissible substitution): Payer: 59 | Admitting: Internal Medicine

## 2014-02-26 NOTE — Progress Notes (Unsigned)
Patient ID: Stacy [name], female   DOB: June 29, 1971, 43 y.o.   MRN: [id number]  Stacy, [name] B  Date of visit:  02/26/2014 DOB:  [birth year]    Age:  43 yrs. Medical record number:  [id number]     Account number:  [id number] Primary Care Provider: Debbrah Alar   CURRENT DIAGNOSES  1. Dyspnea  2. Chest Pain  3. Obesity(BMI30-40)  4. Dyspnea ____________________________ ALLERGIES  Diphenhydramine, Intolerance-unknown  Hydromorphone, Nausea  Morphine, Nausea ____________________________ MEDICATIONS  1. alprazolam 0.5 mg tablet, PRN  2. ranitidine 150 mg tablet, BID ____________________________ CHIEF COMPLAINTS  Dyspnea chest pressure  Racing pulse ____________________________ HISTORY OF PRESENT ILLNESS This very nice 43 year old black female is seen for evaluation of shortness of breath, chest tightness and some racing heartbeat. She has been obese for several years and has a remote history of depression. She has had some difficulty with esophageal strictures as well as reflux esophagitis and has been seen by gastroenterologists here as well as at [facility]. She has a several month history of shortness of breath, dyspnea and chest discomfort. She has had several emergency room visits during that time because of chest discomfort. She was seen in December with midsternal crushing type chest discomfort and was sent home. Prior to that she had had a negative venous Doppler scan. She was seen in the emergency room earlier in the month of March with discomfort that radiated into her right arm this started while she was at a meeting at work. She had another episode of midsternal discomfort that lasted over 6 hours described as pressure with radiation to the right arm that was associated with a negative evaluation in the emergency room. She had a chest CT scan in 2012 that was unremarkable and did not show coronary calcification. She reports a premature history of heart disease in her  family. 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 She still sees a Social worker periodically. She has significant insomnia. She tries to work out about 3 days per week. She will occasionally complained of substernal pressure type pain as well as dyspnea and states that she will wake up at night gasping for air. She describes heartbeat will be variable and will race at times. ____________________________ PAST HISTORY  Past Medical Illnesses:  GERD, obesity, depression  Cardiovascular Illnesses:  no previous history of cardiac disease.;  Surgical Procedures:  cesarean section;  Cardiology Procedures-Invasive:  no history of prior cardiac procedures;  Cardiology Procedures-Noninvasive:  no previous non-invasive procedures;  LVEF not documented,   ____________________________ CARDIO-PULMONARY TEST DATES EKG Date:  02/26/2014;   ____________________________ FAMILY HISTORY Brother -- Brother alive and well Father -- Father alive and well Mother -- Mother alive with problem, Atrial fibrillation Sister -- Sister alive with problem, Hypertension ____________________________ SOCIAL HISTORY Alcohol Use:  socially;  Smoking:  never smoked;  Diet:  regular diet;  Lifestyle:  separated and 2 daughters;  Exercise:  walking for approximately 45 minutes 3 days per week;  Occupation:  Financial controller;   ____________________________ REVIEW OF SYSTEMS General:  obesity  Integumentary:no rashes or new skin lesions. Eyes: denies diplopia, history of glaucoma or visual problems. Ears, Nose, Throat, Mouth:  denies any hearing loss, epistaxis, hoarseness or difficulty speaking. Respiratory: denies dyspnea, cough, wheezing or hemoptysis. Cardiovascular:  please review HPI Abdominal: history  of GERDGenitourinary-Female: no dysuria, urgency, frequency,  UTIs, or stress incontinence Musculoskeletal:  denies arthritis, venous insufficiency, or muscle weakness Neurological:  occasional headaches Psychiatric:  situational stress, insomnia  ____________________________ PHYSICAL EXAMINATION VITAL SIGNS  Blood Pressure:  104/60 Sitting, Left arm, regular cuff  , 108/62 Standing, Left arm and regular cuff   Pulse:  82/min. Weight:  200.00 lbs. Height:  66"BMI: 32  Constitutional:  pleasant African American female in no acute distress, moderately obese Skin:  warm and dry to touch, no apparent skin lesions, or masses noted. Head:  normocephalic, normal hair pattern, no masses or tenderness Eyes:  EOMS Intact, PERRLA, C and S clear, Funduscopic exam not done. ENT:  ears, nose and throat reveal no gross abnormalities.  Dentition good. Neck:  supple, without massess. No JVD, thyromegaly or carotid bruits. Carotid upstroke normal. Chest:  normal symmetry, clear to auscultation. Cardiac:  regular rhythm, normal S1 and S2, No S3 or S4, no murmurs, gallops or rubs detected. Abdomen:  abdomen soft,non-tender, no masses, no hepatospenomegaly, or aneurysm noted Peripheral Pulses:  the femoral,dorsalis pedis, and posterior tibial pulses are full and equal bilaterally with no bruits auscultated. Extremities & Back:  no deformities, clubbing, cyanosis, erythema or edema observed. Normal muscle strength and tone. Neurological:  no gross motor or sensory deficits noted, affect appropriate, oriented x3. ____________________________ IMPRESSIONS/PLAN  1. Chest discomfort and dyspnea with a low BNP level with some atypical features and a normal EKG and examination 2. Obesity 3. History of reflux and esophageal stricture 4. History of depression with hospitalization many years ago  Recommendations:  Obtain echocardiogram and treadmill test to evaluate the dyspnea and chest pain. Her chest x-ray recently was normal and recent lab was  normal. ____________________________ TODAYS ORDERS  1. 12 Lead EKG: Today  2. treadmill:  Regular TM First Available  3. 2D, color flow, doppler: First Available                       ____________________________ Cardiology Physician:  Kerry Hough MD [facility]

## 2014-03-11 ENCOUNTER — Institutional Professional Consult (permissible substitution): Payer: 59 | Admitting: Internal Medicine

## 2014-04-06 ENCOUNTER — Encounter (HOSPITAL_BASED_OUTPATIENT_CLINIC_OR_DEPARTMENT_OTHER): Payer: Self-pay | Admitting: Emergency Medicine

## 2014-04-06 ENCOUNTER — Emergency Department (HOSPITAL_BASED_OUTPATIENT_CLINIC_OR_DEPARTMENT_OTHER)
Admission: EM | Admit: 2014-04-06 | Discharge: 2014-04-06 | Disposition: A | Discharge status: Home / Self Care | Payer: 59 | Attending: Emergency Medicine | Admitting: Emergency Medicine

## 2014-04-06 ENCOUNTER — Emergency Department (HOSPITAL_BASED_OUTPATIENT_CLINIC_OR_DEPARTMENT_OTHER): Payer: 59

## 2014-04-06 DIAGNOSIS — F411 Generalized anxiety disorder: Secondary | ICD-10-CM | POA: Insufficient documentation

## 2014-04-06 DIAGNOSIS — Z862 Personal history of diseases of the blood and blood-forming organs and certain disorders involving the immune mechanism: Secondary | ICD-10-CM | POA: Insufficient documentation

## 2014-04-06 DIAGNOSIS — Z79899 Other long term (current) drug therapy: Secondary | ICD-10-CM | POA: Insufficient documentation

## 2014-04-06 DIAGNOSIS — F3289 Other specified depressive episodes: Secondary | ICD-10-CM | POA: Insufficient documentation

## 2014-04-06 DIAGNOSIS — K219 Gastro-esophageal reflux disease without esophagitis: Secondary | ICD-10-CM | POA: Insufficient documentation

## 2014-04-06 DIAGNOSIS — F329 Major depressive disorder, single episode, unspecified: Secondary | ICD-10-CM | POA: Insufficient documentation

## 2014-04-06 LAB — CBC WITH DIFFERENTIAL/PLATELET
Basophils Absolute: 0 10*3/uL (ref 0.0–0.1)
Basophils Relative: 0 % (ref 0–1)
Eosinophils Absolute: 0.1 10*3/uL (ref 0.0–0.7)
Eosinophils Relative: 2 % (ref 0–5)
HCT: 37.9 % (ref 36.0–46.0)
Hemoglobin: 13.7 g/dL (ref 12.0–15.0)
LYMPHS ABS: 2.1 10*3/uL (ref 0.7–4.0)
LYMPHS PCT: 38 % (ref 12–46)
MCH: 30.2 pg (ref 26.0–34.0)
MCHC: 36.1 g/dL — ABNORMAL HIGH (ref 30.0–36.0)
MCV: 83.5 fL (ref 78.0–100.0)
Monocytes Absolute: 0.5 10*3/uL (ref 0.1–1.0)
Monocytes Relative: 9 % (ref 3–12)
NEUTROS ABS: 2.8 10*3/uL (ref 1.7–7.7)
Neutrophils Relative %: 51 % (ref 43–77)
PLATELETS: 362 10*3/uL (ref 150–400)
RBC: 4.54 MIL/uL (ref 3.87–5.11)
RDW: 12.8 % (ref 11.5–15.5)
WBC: 5.5 10*3/uL (ref 4.0–10.5)

## 2014-04-06 LAB — COMPREHENSIVE METABOLIC PANEL
ALK PHOS: 60 U/L (ref 39–117)
ALT: 10 U/L (ref 0–35)
AST: 13 U/L (ref 0–37)
Albumin: 3.9 g/dL (ref 3.5–5.2)
BILIRUBIN TOTAL: 0.5 mg/dL (ref 0.3–1.2)
BUN: 10 mg/dL (ref 6–23)
CALCIUM: 9.8 mg/dL (ref 8.4–10.5)
CHLORIDE: 103 meq/L (ref 96–112)
CO2: 26 meq/L (ref 19–32)
Creatinine, Ser: 0.6 mg/dL (ref 0.50–1.10)
GLUCOSE: 87 mg/dL (ref 70–99)
Potassium: 3.8 mEq/L (ref 3.7–5.3)
SODIUM: 141 meq/L (ref 137–147)
Total Protein: 8.3 g/dL (ref 6.0–8.3)

## 2014-04-06 LAB — LIPASE, BLOOD: Lipase: 14 U/L (ref 11–59)

## 2014-04-06 LAB — TROPONIN I: Troponin I: <0.3 ng/mL (ref <0.30)

## 2014-04-06 MED ORDER — OMEPRAZOLE 20 MG PO CPDR: DELAYED_RELEASE_CAPSULE | ORAL | Status: DC

## 2014-04-06 MED ORDER — ONDANSETRON 8 MG PO TBDP
8.0000 mg | ORAL_TABLET | Freq: Once | ORAL | Status: AC
  Administered 2014-04-06: 8 mg via ORAL
  Filled 2014-04-06: qty 1

## 2014-04-06 MED ORDER — GI COCKTAIL ~~LOC~~
30.0000 mL | Freq: Once | ORAL | Status: AC
  Administered 2014-04-06: 30 mL via ORAL
  Filled 2014-04-06: qty 30

## 2014-04-06 NOTE — Discharge Instructions (Signed)

## 2014-04-06 NOTE — ED Provider Notes (Signed)
CSN: [id number]     Arrival date & time 04/06/14  1123 History   First MD Initiated Contact with Patient 04/06/14 1205     Chief Complaint  Patient presents with  . Gastrophageal Reflux     (Consider location/radiation/quality/duration/timing/severity/associated sxs/prior Treatment) Patient is a 43 y.o. female presenting with GERD. The history is provided by the patient. No language interpreter was used.  Gastrophageal Reflux This is a recurrent problem. The current episode started in the past 7 days. Associated symptoms include chest pain and nausea. Pertinent negatives include no chills or fever. Associated symptoms comments: She is having symptoms of chest tightness, a sense of swelling in her throat, non-radiating, burning type pain in the epigastrium and nausea. She reports a history significant for reflux and multiple esophageal dilatations, followed by Dr. Benson Norway initially, and now with [facility]. She also reports a recent upper endoscopy that was "negative" per patient. No cough or fever. She takes Zantac without relief. .    Past Medical History  Diagnosis Date  . Esophageal stricture   . Chest pain 09/14/2009    no pulmonary embolus by chest CT  . Palpitations   . Elevated BP   . Anxiety     past Hx  . Depression     past Hx  . GERD (gastroesophageal reflux disease)     no meds  . Sickle cell trait   . Fibroid   . Abnormal Pap smear     cryo, normal since   Past Surgical History  Procedure Laterality Date  . Cesarean section  2004  . Laparoscopy for ectopic pregnancy  2001  . Wisdom tooth extraction  2012   Family History  Problem Relation Age of Onset  . Asthma Mother   . Hypertension Mother   . Hypertension Paternal Uncle   . Asthma Maternal Grandmother    History  Substance Use Topics  . Smoking status: Never Smoker   . Smokeless tobacco: Never Used  . Alcohol Use: Yes     Comment: social   OB History   Grav Para Term Preterm Abortions TAB SAB Ect Mult  Living   5 2 2  0 3 0 0 3 0 2     Review of Systems  Constitutional: Negative for fever and chills.  HENT: Positive for trouble swallowing.   Respiratory: Positive for shortness of breath.   Cardiovascular: Positive for chest pain.  Gastrointestinal: Positive for nausea.  Musculoskeletal: Negative.   Skin: Negative.   Neurological: Negative.       Allergies  Diphenhydramine hcl; Hydromorphone hcl; and Morphine  Home Medications   Prior to Admission medications   Medication Sig Start Date End Date Taking? Authorizing Provider  ALPRAZolam Duanne Moron) 0.5 MG tablet Take 1 tablet (0.5 mg total) by mouth at bedtime as needed for anxiety. 01/31/14   Elisha Headland, NP  ranitidine (ZANTAC) 150 MG tablet Take 150 mg by mouth 2 (two) times daily.    Historical Provider, MD   BP 121/75  Pulse 71  Resp 20  Wt 198 lb (89.812 kg)  SpO2 100%  LMP 03/22/2014 Physical Exam  Constitutional: She is oriented to person, place, and time. She appears well-developed and well-nourished.  HENT:  Head: Normocephalic.  Neck: Normal range of motion. Neck supple.  Cardiovascular: Normal rate and regular rhythm.   Pulmonary/Chest: Effort normal and breath sounds normal.  Abdominal: Soft. Bowel sounds are normal. There is no tenderness. There is no rebound and no guarding.  Musculoskeletal: Normal range  of motion.  Neurological: She is alert and oriented to person, place, and time.  Skin: Skin is warm and dry. No rash noted.  Psychiatric: She has a normal mood and affect.    ED Course  Procedures (including critical care time) Labs Review Labs Reviewed  CBC WITH DIFFERENTIAL  COMPREHENSIVE METABOLIC PANEL  LIPASE, BLOOD  TROPONIN I   Results for orders placed during the hospital encounter of 04/06/14  CBC WITH DIFFERENTIAL      Result Value Ref Range   WBC 5.5  4.0 - 10.5 K/uL   RBC 4.54  3.87 - 5.11 MIL/uL   Hemoglobin 13.7  12.0 - 15.0 g/dL   HCT 37.9  36.0 - 46.0 %   MCV 83.5  78.0 - 100.0  fL   MCH 30.2  26.0 - 34.0 pg   MCHC 36.1 (*) 30.0 - 36.0 g/dL   RDW 12.8  11.5 - 15.5 %   Platelets 362  150 - 400 K/uL   Neutrophils Relative % 51  43 - 77 %   Neutro Abs 2.8  1.7 - 7.7 K/uL   Lymphocytes Relative 38  12 - 46 %   Lymphs Abs 2.1  0.7 - 4.0 K/uL   Monocytes Relative 9  3 - 12 %   Monocytes Absolute 0.5  0.1 - 1.0 K/uL   Eosinophils Relative 2  0 - 5 %   Eosinophils Absolute 0.1  0.0 - 0.7 K/uL   Basophils Relative 0  0 - 1 %   Basophils Absolute 0.0  0.0 - 0.1 K/uL  COMPREHENSIVE METABOLIC PANEL      Result Value Ref Range   Sodium 141  137 - 147 mEq/L   Potassium 3.8  3.7 - 5.3 mEq/L   Chloride 103  96 - 112 mEq/L   CO2 26  19 - 32 mEq/L   Glucose, Bld 87  70 - 99 mg/dL   BUN 10  6 - 23 mg/dL   Creatinine, Ser 0.60  0.50 - 1.10 mg/dL   Calcium 9.8  8.4 - 10.5 mg/dL   Total Protein 8.3  6.0 - 8.3 g/dL   Albumin 3.9  3.5 - 5.2 g/dL   AST 13  0 - 37 U/L   ALT 10  0 - 35 U/L   Alkaline Phosphatase 60  39 - 117 U/L   Total Bilirubin 0.5  0.3 - 1.2 mg/dL   GFR calc non Af Amer >90  >90 mL/min   GFR calc Af Amer >90  >90 mL/min  LIPASE, BLOOD      Result Value Ref Range   Lipase 14  11 - 59 U/L  TROPONIN I      Result Value Ref Range   Troponin I <0.30  <0.30 ng/mL   Dg Chest 2 View  04/06/2014   CLINICAL DATA:  GASTROPHAGEAL REFLUX  EXAM: CHEST  2 VIEW  COMPARISON:  DG CHEST 2 VIEW dated 02/21/2014  FINDINGS: The heart size and mediastinal contours are within normal limits. Both lungs are clear. The visualized skeletal structures are unremarkable.  IMPRESSION: No active cardiopulmonary disease.   Electronically Signed   By: Margaree Mackintosh M.D.   On: 04/06/2014 12:56   Imaging Review Dg Chest 2 View  04/06/2014   CLINICAL DATA:  GASTROPHAGEAL REFLUX  EXAM: CHEST  2 VIEW  COMPARISON:  DG CHEST 2 VIEW dated 02/21/2014  FINDINGS: The heart size and mediastinal contours are within normal limits. Both lungs are clear. The visualized  skeletal structures are  unremarkable.  IMPRESSION: No active cardiopulmonary disease.   Electronically Signed   By: Margaree Mackintosh M.D.   On: 04/06/2014 12:56     EKG Interpretation   Date/Time:  Sunday Apr 06 2014 11:34:44 EDT Ventricular Rate:  79 PR Interval:  140 QRS Duration: 92 QT Interval:  372 QTC Calculation: 426 R Axis:   69 Text Interpretation:  Normal sinus rhythm Incomplete right bundle branch  block No significant change since last tracing Confirmed by HARRISON  MD,  [place] ([id number]) on 04/06/2014 12:09:04 PM      MDM   Final diagnoses:  None    1. GERD  Improved with GI cocktail. Labs, CXR, imaging reassuring. DDx: GERD vs ACS vs cholecystitis. Symptoms improved with GI medication and normal labs suggest symptoms today related to GERD. Doubt ACS, cholecystitis.     Dewaine Oats, PA-C 04/06/14 1424

## 2014-04-06 NOTE — ED Notes (Signed)
Patient here for evaluation of gastric reflux and chest burning. Was seen a month ago by cardiology and being referred to GI. Patient reports when she has this reflux event she also has some shortness of breath, no distress

## 2014-04-07 NOTE — ED Provider Notes (Signed)
Medical screening examination/treatment/procedure(s) were performed by non-physician practitioner and as supervising physician I was immediately available for consultation/collaboration.   EKG Interpretation   Date/Time:  Sunday Apr 06 2014 11:34:44 EDT Ventricular Rate:  79 PR Interval:  140 QRS Duration: 92 QT Interval:  372 QTC Calculation: 426 R Axis:   69 Text Interpretation:  Normal sinus rhythm Incomplete right bundle branch  block No significant change since last tracing Confirmed by Teal Raben  MD,  Ilaisaane Marts ([id number]) on 04/06/2014 12:09:04 PM        Blanchard Kelch, MD 04/07/14 2012

## 2014-04-16 ENCOUNTER — Other Ambulatory Visit: Payer: Self-pay | Admitting: Obstetrics and Gynecology

## 2014-04-16 DIAGNOSIS — R928 Other abnormal and inconclusive findings on diagnostic imaging of breast: Secondary | ICD-10-CM

## 2014-04-28 ENCOUNTER — Other Ambulatory Visit: Payer: 59

## 2014-07-14 ENCOUNTER — Encounter: Payer: Self-pay | Admitting: Physician Assistant

## 2014-07-14 ENCOUNTER — Ambulatory Visit (INDEPENDENT_AMBULATORY_CARE_PROVIDER_SITE_OTHER): Payer: 59 | Admitting: Physician Assistant

## 2014-07-14 VITALS — BP 114/82 | HR 79 | Temp 99.3°F | Resp 16 | Ht 65.0 in | Wt 202.0 lb

## 2014-07-14 DIAGNOSIS — J209 Acute bronchitis, unspecified: Secondary | ICD-10-CM | POA: Insufficient documentation

## 2014-07-14 MED ORDER — ALBUTEROL SULFATE HFA 108 (90 BASE) MCG/ACT IN AERS: 2.0000 | INHALATION_SPRAY | Freq: Four times a day (QID) | RESPIRATORY_TRACT | Status: DC | PRN

## 2014-07-14 MED ORDER — AMOXICILLIN 875 MG PO TABS: 875.0000 mg | ORAL_TABLET | Freq: Two times a day (BID) | ORAL | Status: DC

## 2014-07-14 MED ORDER — ALPRAZOLAM 0.5 MG PO TABS: 0.5000 mg | ORAL_TABLET | Freq: Every evening | ORAL | Status: DC | PRN

## 2014-07-14 NOTE — Progress Notes (Signed)
Pre visit review using our clinic review tool, if applicable. No additional management support is needed unless otherwise documented below in the visit note/SLS  

## 2014-07-14 NOTE — Assessment & Plan Note (Signed)
Rx Amoxicillin. Increase fluids.  Delsym for cough. Rx Albuterol.  Humidifier in bedroom. Return precautions discussed with patient.

## 2014-07-14 NOTE — Progress Notes (Signed)
Patient presents to clinic today c/o 2 weeks of cough, chest congestion, sore throat and mild SOB. Endorses some pain in ribs with coughing.  Denies pleuritic chest pain.  Denies recent travel or sick contact.    Patient also requesting refill of her Xanax for anxiety.  Endorses symptoms are well-controlled.  Past Medical History  Diagnosis Date  . Esophageal stricture   . Chest pain 09/14/2009    no pulmonary embolus by chest CT  . Palpitations   . Elevated BP   . Anxiety     past Hx  . Depression     past Hx  . GERD (gastroesophageal reflux disease)     no meds  . Sickle cell trait   . Fibroid   . Abnormal Pap smear     cryo, normal since    Current Outpatient Prescriptions on File Prior to Visit  Medication Sig Dispense Refill  . omeprazole (PRILOSEC) 20 MG capsule Take one tablet twice daily x 5 days, then once daily after that.  30 capsule  0  . ranitidine (ZANTAC) 150 MG tablet Take 150 mg by mouth 2 (two) times daily.       No current facility-administered medications on file prior to visit.    Allergies  Allergen Reactions  . Diphenhydramine Hcl Nausea Only  . Hydromorphone Hcl Nausea And Vomiting  . Morphine Nausea And Vomiting    Family History  Problem Relation Age of Onset  . Asthma Mother   . Hypertension Mother   . Hypertension Paternal Uncle   . Asthma Maternal Grandmother     History   Social History  . Marital Status: Legally Separated    Spouse Name: N/A    Number of Children: 2  . Years of Education: N/A   Occupational History  .  [employer]   Social History Main Topics  . Smoking status: Never Smoker   . Smokeless tobacco: Never Used  . Alcohol Use: Yes     Comment: social  . Drug Use: No  . Sexual Activity: None   Other Topics Concern  . None   Social History Narrative  . None    Review of Systems - See HPI.  All other ROS are negative.  BP 114/82  Pulse 79  Temp(Src) 99.3 F (37.4 C) (Oral)  Resp 16  Ht 5\' 5"  (1.651  m)  Wt 202 lb (91.627 kg)  BMI 33.61 kg/m2  SpO2 98%  LMP 07/11/2014  Physical Exam  Vitals reviewed. Constitutional: She is well-developed, well-nourished, and in no distress.  HENT:  Head: Normocephalic and atraumatic.  Right Ear: Tympanic membrane, external ear and ear canal normal.  Left Ear: Tympanic membrane, external ear and ear canal normal.  Nose: Nose normal.  Mouth/Throat: Uvula is midline, oropharynx is clear and moist and mucous membranes are normal. No oropharyngeal exudate, posterior oropharyngeal edema, posterior oropharyngeal erythema or tonsillar abscesses.  Eyes: Pupils are equal, round, and reactive to light.  Neck: Neck supple.  Cardiovascular: Normal rate, regular rhythm, normal heart sounds and intact distal pulses.   Pulmonary/Chest: Effort normal and breath sounds normal. No respiratory distress. She has no wheezes. She has no rales. She exhibits no tenderness.  Lymphadenopathy:    She has no cervical adenopathy.  Neurological: She is alert.  Skin: Skin is warm and dry. No rash noted.  Psychiatric: Affect normal.   Assessment/Plan: Acute bronchitis Rx Amoxicillin. Increase fluids.  Delsym for cough. Rx Albuterol.  Humidifier in bedroom. Return precautions discussed  with patient.

## 2014-07-14 NOTE — Patient Instructions (Signed)
Please take antibiotic as directed.  Increase fluid intake.  Rest.  Use Delsym for cough.  Use Albuterol inhaler every 6 hours if needed for wheeze or chest tightness.  Place a humidifier in the bedroom.  Call or return to clinic if symptoms are not improving.  Metered Dose Inhaler (No Spacer Used) Inhaled medicines are the basis of treatment for asthma and other breathing problems. Inhaled medicine can only be effective if used properly. Good technique assures that the medicine reaches the lungs. Metered dose inhalers (MDIs) are used to deliver a variety of inhaled medicines. These include quick relief or rescue medicines (such as bronchodilators) and controller medicines (such as corticosteroids). The medicine is delivered by pushing down on a metal canister to release a set amount of spray. If you are using different kinds of inhalers, use your quick relief medicine to open the airways 10-15 minutes before using a steroid, if instructed to do so by your health care provider. If you are unsure which inhalers to use and the order of using them, ask your health care provider, nurse, or respiratory therapist. HOW TO USE THE INHALER 1. Remove the cap from the inhaler. 2. If you are using the inhaler for the first time, you will need to prime it. Shake the inhaler for 5 seconds and release four puffs into the air, away from your face. Ask your health care provider or pharmacist if you have questions about priming your inhaler. 3. Shake the inhaler for 5 seconds before each breath in (inhalation). 4. Position the inhaler so that the top of the canister faces up. 5. Put your index finger on the top of the medicine canister. Your thumb supports the bottom of the inhaler. 6. Open your mouth. 7. Either place the inhaler between your teeth and place your lips tightly around the mouthpiece, or hold the inhaler 1-2 inches away from your open mouth. If you are unsure of which technique to use, ask your health care  provider. 8. Breathe out (exhale) normally and as completely as possible. 9. Press the canister down with the index finger to release the medicine. 10. At the same time as the canister is pressed, inhale deeply and slowly until your lungs are completely filled. This should take 4-6 seconds. Keep your tongue down. 11. Hold the medicine in your lungs for 5-10 seconds (10 seconds is best). This helps the medicine get into the small airways of your lungs. 12. Breathe out slowly, through pursed lips. Whistling is an example of pursed lips. 13. Wait at least 1 minute between puffs. Continue with the above steps until you have taken the number of puffs your health care provider has ordered. Do not use the inhaler more than your health care provider directs you to. 14. Replace the cap on the inhaler. 15. Follow the directions from your health care provider or the inhaler insert for cleaning the inhaler. If you are using a steroid inhaler, after your last puff, rinse your mouth with water, gargle, and spit out the water. Do not swallow the water. AVOID:  Inhaling before or after starting the spray of medicine. It takes practice to coordinate your breathing with triggering the spray.  Inhaling through the nose (rather than the mouth) when triggering the spray. HOW TO DETERMINE IF YOUR INHALER IS FULL OR NEARLY EMPTY You cannot know when an inhaler is empty by shaking it. Some inhalers are now being made with dose counters. Ask your health care provider for a prescription that  has a dose counter if you feel you need that extra help. If your inhaler does not have a counter, ask your health care provider to help you determine the date you need to refill your inhaler. Write the refill date on a calendar or your inhaler canister. Refill your inhaler 7-10 days before it runs out. Be sure to keep an adequate supply of medicine. This includes making sure it has not expired, and making sure you have a spare  inhaler. SEEK MEDICAL CARE IF:  Symptoms are only partially relieved with your inhaler.  You are having trouble using your inhaler.  You experience an increase in phlegm. SEEK IMMEDIATE MEDICAL CARE IF:  You feel little or no relief with your inhalers. You are still wheezing and feeling shortness of breath, tightness in your chest, or both.  You have dizziness, headaches, or a fast heart rate.  You have chills, fever, or night sweats.  There is a noticeable increase in phlegm production, or there is blood in the phlegm. MAKE SURE YOU:  Understand these instructions.  Will watch your condition.  Will get help right away if you are not doing well or get worse. Document Released: 09/11/2007 Document Revised: 03/31/2014 Document Reviewed: 05/02/2013 [facility] Patient Information 2015 [place], [place]. This information is not intended to replace advice given to you by your health care provider. Make sure you discuss any questions you have with your health care provider.

## 2014-07-30 ENCOUNTER — Encounter (HOSPITAL_BASED_OUTPATIENT_CLINIC_OR_DEPARTMENT_OTHER): Payer: Self-pay | Admitting: Emergency Medicine

## 2014-07-30 ENCOUNTER — Emergency Department (HOSPITAL_BASED_OUTPATIENT_CLINIC_OR_DEPARTMENT_OTHER): Payer: 59

## 2014-07-30 ENCOUNTER — Emergency Department (HOSPITAL_BASED_OUTPATIENT_CLINIC_OR_DEPARTMENT_OTHER)
Admission: EM | Admit: 2014-07-30 | Discharge: 2014-07-30 | Disposition: A | Discharge status: Home / Self Care | Payer: 59 | Attending: Emergency Medicine | Admitting: Emergency Medicine

## 2014-07-30 DIAGNOSIS — F411 Generalized anxiety disorder: Secondary | ICD-10-CM | POA: Diagnosis not present

## 2014-07-30 DIAGNOSIS — Z79899 Other long term (current) drug therapy: Secondary | ICD-10-CM | POA: Insufficient documentation

## 2014-07-30 DIAGNOSIS — K219 Gastro-esophageal reflux disease without esophagitis: Secondary | ICD-10-CM | POA: Diagnosis not present

## 2014-07-30 DIAGNOSIS — M79605 Pain in left leg: Secondary | ICD-10-CM

## 2014-07-30 DIAGNOSIS — F329 Major depressive disorder, single episode, unspecified: Secondary | ICD-10-CM | POA: Insufficient documentation

## 2014-07-30 DIAGNOSIS — Z792 Long term (current) use of antibiotics: Secondary | ICD-10-CM | POA: Insufficient documentation

## 2014-07-30 DIAGNOSIS — F3289 Other specified depressive episodes: Secondary | ICD-10-CM | POA: Diagnosis not present

## 2014-07-30 DIAGNOSIS — IMO0002 Reserved for concepts with insufficient information to code with codable children: Secondary | ICD-10-CM | POA: Insufficient documentation

## 2014-07-30 DIAGNOSIS — Z8742 Personal history of other diseases of the female genital tract: Secondary | ICD-10-CM | POA: Insufficient documentation

## 2014-07-30 DIAGNOSIS — K222 Esophageal obstruction: Secondary | ICD-10-CM | POA: Insufficient documentation

## 2014-07-30 DIAGNOSIS — M5410 Radiculopathy, site unspecified: Secondary | ICD-10-CM

## 2014-07-30 DIAGNOSIS — M79609 Pain in unspecified limb: Secondary | ICD-10-CM | POA: Diagnosis not present

## 2014-07-30 LAB — BASIC METABOLIC PANEL
Anion gap: 14 (ref 5–15)
BUN: 9 mg/dL (ref 6–23)
CALCIUM: 9.3 mg/dL (ref 8.4–10.5)
CO2: 24 meq/L (ref 19–32)
CREATININE: 0.7 mg/dL (ref 0.50–1.10)
Chloride: 102 mEq/L (ref 96–112)
GFR calc Af Amer: >90 mL/min (ref >90)
Glucose, Bld: 91 mg/dL (ref 70–99)
Potassium: 4.2 mEq/L (ref 3.7–5.3)
Sodium: 140 mEq/L (ref 137–147)

## 2014-07-30 LAB — CBC
HCT: 35.9 % — ABNORMAL LOW (ref 36.0–46.0)
Hemoglobin: 12.7 g/dL (ref 12.0–15.0)
MCH: 29.1 pg (ref 26.0–34.0)
MCHC: 35.4 g/dL (ref 30.0–36.0)
MCV: 82.2 fL (ref 78.0–100.0)
PLATELETS: 320 10*3/uL (ref 150–400)
RBC: 4.37 MIL/uL (ref 3.87–5.11)
RDW: 12.5 % (ref 11.5–15.5)
WBC: 4.7 10*3/uL (ref 4.0–10.5)

## 2014-07-30 MED ORDER — OXYCODONE-ACETAMINOPHEN 5-325 MG PO TABS: 1.0000 | ORAL_TABLET | ORAL | Status: DC | PRN

## 2014-07-30 NOTE — ED Provider Notes (Signed)
CSN: [id number]     Arrival date & time 07/30/14  0911 History   First MD Initiated Contact with Patient 07/30/14 [phone]     Chief Complaint  Patient presents with  . Leg Pain     (Consider location/radiation/quality/duration/timing/severity/associated sxs/prior Treatment) HPI Pt presenting with c/o left leg numbness and pain.  Pt states the pain is in her left thigh on the lateral aspect and traveling down past her left knee. She states she woke up several days ago and left felt numb- since then she has bee having shooting pains in her left leg.  Also some left sided low back pain.  No swelling of leg.  No chest pain or shortness of breath.  No hx of dvt/PE, no hx of travel/trauma/surgery.  No difficulty urinating, no incontinence of bowel or bladder.  No fever/chills.  No trauma to back or leg.  There are no other associated systemic symptoms, there are no other alleviating or modifying factors.   Past Medical History  Diagnosis Date  . Esophageal stricture   . Chest pain 09/14/2009    no pulmonary embolus by chest CT  . Palpitations   . Elevated BP   . Anxiety     past Hx  . Depression     past Hx  . GERD (gastroesophageal reflux disease)     no meds  . Sickle cell trait   . Fibroid   . Abnormal Pap smear     cryo, normal since   Past Surgical History  Procedure Laterality Date  . Cesarean section  2004  . Laparoscopy for ectopic pregnancy  2001  . Wisdom tooth extraction  2012   Family History  Problem Relation Age of Onset  . Asthma Mother   . Hypertension Mother   . Hypertension Paternal Uncle   . Asthma Maternal Grandmother    History  Substance Use Topics  . Smoking status: Never Smoker   . Smokeless tobacco: Never Used  . Alcohol Use: Yes     Comment: social   OB History   Grav Para Term Preterm Abortions TAB SAB Ect Mult Living   5 2 2  0 3 0 0 3 0 2     Review of Systems ROS reviewed and all otherwise negative except for mentioned in  HPI    Allergies  Diphenhydramine hcl; Hydromorphone hcl; and Morphine  Home Medications   Prior to Admission medications   Medication Sig Start Date End Date Taking? Authorizing Provider  Esomeprazole Magnesium (NEXIUM PO) Take by mouth.   Yes Historical Provider, MD  Famotidine (PEPCID PO) Take by mouth.   Yes Historical Provider, MD  albuterol (PROVENTIL HFA;VENTOLIN HFA) 108 (90 BASE) MCG/ACT inhaler Inhale 2 puffs into the lungs every 6 (six) hours as needed for wheezing. 07/14/14   Brunetta Jeans, PA-C  ALPRAZolam Duanne Moron) 0.5 MG tablet Take 1 tablet (0.5 mg total) by mouth at bedtime as needed for anxiety. 07/14/14   Brunetta Jeans, PA-C  amoxicillin (AMOXIL) 875 MG tablet Take 1 tablet (875 mg total) by mouth 2 (two) times daily. 07/14/14   Brunetta Jeans, PA-C  omeprazole (PRILOSEC) 20 MG capsule Take one tablet twice daily x 5 days, then once daily after that. 04/06/14   Shari A Upstill, PA-C  oxyCODONE-acetaminophen (ROXICET) 5-325 MG per tablet Take 1-2 tablets by mouth every 4 (four) hours as needed for severe pain. 07/30/14   Threasa Beards, MD  ranitidine (ZANTAC) 150 MG tablet Take 150 mg by  mouth 2 (two) times daily.    Historical Provider, MD   BP 111/66  Pulse 72  Temp(Src) 99.3 F (37.4 C) (Oral)  Resp 18  Ht 5\' 6"  (1.676 m)  Wt 195 lb (88.451 kg)  BMI 31.49 kg/m2  SpO2 100%  LMP 07/11/2014 Vitals reviewed Physical Exam Physical Examination: General appearance - alert, well appearing, and in no distress Mental status - alert, oriented to person, place, and time Eyes - no conjunctival injection, no scleral icterus Chest - clear to auscultation, no wheezes, rales or rhonchi, symmetric air entry Heart - normal rate, regular rhythm, normal S1, S2, no murmurs, rubs, clicks or gallops Abdomen - soft, nontender, nondistended, no masses or organomegaly Back exam - mild left sided paraspinal tenderness, no midline lumbar tenderness to palpation, no CVA  tenderness Neurological - alert, oriented x 3, normal speech,  Cranial nerves grossly intact, Strength 5/5 in lower extremities x 4, sensation intact Extremities - peripheral pulses normal, no pedal edema, no clubbing or cyanosis Skin - normal coloration and turgor, no rashes  ED Course  Procedures (including critical care time) Labs Review Labs Reviewed  CBC - Abnormal; Notable for the following:    HCT 35.9 (*)    All other components within normal limits  BASIC METABOLIC PANEL    Imaging Review No results found.   EKG Interpretation None      MDM   Final diagnoses:  Pain of left lower extremity  Radicular pain of left lower extremity    Pt with left lower extremity pain and intermittent numbness, neurologically intact today.  Suspect radicular symptoms from lumbar region, no signs or symptoms of cauda equina.  DVT study shows no DVT.  Discharged with strict return precautions.  Pt agreeable with plan.    Threasa Beards, MD 08/01/14 [phone]

## 2014-07-30 NOTE — Discharge Instructions (Signed)
Return to the ED with any concerns including weakness of legs, not able to urinate, loss of control of bowel or bladder, fever/chills, decreased level of alertness/lethargy, or any other alarming symptoms °

## 2014-07-30 NOTE — ED Notes (Signed)
Patient c/o L leg pain and numbness since last Friday, states she woke up with left leg numbness and fell. Has been experiencing leg numbness intermittently since that time. Woke up this morning with the leg pain again. Some nausea from acid reflux also

## 2014-09-29 ENCOUNTER — Encounter (HOSPITAL_BASED_OUTPATIENT_CLINIC_OR_DEPARTMENT_OTHER): Payer: Self-pay | Admitting: Emergency Medicine

## 2014-10-01 ENCOUNTER — Ambulatory Visit (INDEPENDENT_AMBULATORY_CARE_PROVIDER_SITE_OTHER): Payer: Self-pay | Admitting: Family

## 2014-10-01 ENCOUNTER — Encounter: Payer: Self-pay | Admitting: Family

## 2014-10-01 VITALS — BP 125/84 | HR 77 | Temp 98.8°F | Resp 16 | Ht 65.0 in

## 2014-10-01 DIAGNOSIS — E010 Iodine-deficiency related diffuse (endemic) goiter: Secondary | ICD-10-CM

## 2014-10-01 DIAGNOSIS — J029 Acute pharyngitis, unspecified: Secondary | ICD-10-CM

## 2014-10-01 DIAGNOSIS — E049 Nontoxic goiter, unspecified: Secondary | ICD-10-CM

## 2014-10-01 LAB — POCT RAPID STREP A (OFFICE): Rapid Strep A Screen: NEGATIVE

## 2014-10-01 MED ORDER — LIDOCAINE VISCOUS 2 % MT SOLN: 20.0000 mL | OROMUCOSAL | Status: DC | PRN

## 2014-10-01 NOTE — Patient Instructions (Signed)
You may use ibuprofen as needed for throat pain as well as viscous lidocaine as needed. Call if symptoms worsen or if not improved in 2-3 days. You will be contacted about your referral for thyroid ultrasound.

## 2014-10-01 NOTE — Progress Notes (Signed)
Pre visit review using our clinic review tool, if applicable. No additional management support is needed unless otherwise documented below in the visit note. 

## 2014-10-01 NOTE — Assessment & Plan Note (Signed)
New.  Obtain thyroid US.

## 2014-10-01 NOTE — Progress Notes (Signed)
Subjective:    Patient ID: Stacy [name], female    DOB: [birth year], 43 y.o.   MRN: [id number]  HPI  Ms.  Stacy [name] is a 43 yr old female who presents today with chief complaint of sore throat.  Reports + difficulty swallowing. Symptoms started yesterday. Reports mild associated cough. She denies fever. Mild nasal congestion.   Review of Systems See HPI  Past Medical History  Diagnosis Date  . Esophageal stricture   . Chest pain 09/14/2009    no pulmonary embolus by chest CT  . Palpitations   . Elevated BP   . Anxiety     past Hx  . Depression     past Hx  . GERD (gastroesophageal reflux disease)     no meds  . Sickle cell trait   . Fibroid   . Abnormal Pap smear     cryo, normal since    History   Social History  . Marital Status: Legally Separated    Spouse Name: N/A    Number of Children: 2  . Years of Education: N/A   Occupational History  .  [employer]   Social History Main Topics  . Smoking status: Never Smoker   . Smokeless tobacco: Never Used  . Alcohol Use: Yes     Comment: social  . Drug Use: No  . Sexual Activity: Not on file   Other Topics Concern  . Not on file   Social History Narrative    Past Surgical History  Procedure Laterality Date  . Cesarean section  2004  . Laparoscopy for ectopic pregnancy  2001  . Wisdom tooth extraction  2012    Family History  Problem Relation Age of Onset  . Asthma Mother   . Hypertension Mother   . Hypertension Paternal Uncle   . Asthma Maternal Grandmother     Allergies  Allergen Reactions  . Diphenhydramine Hcl Nausea Only  . Hydromorphone Hcl Nausea And Vomiting  . Morphine Nausea And Vomiting    Current Outpatient Prescriptions on File Prior to Visit  Medication Sig Dispense Refill  . ALPRAZolam (XANAX) 0.5 MG tablet Take 1 tablet (0.5 mg total) by mouth at bedtime as needed for anxiety. 30 tablet 0  . omeprazole (PRILOSEC) 20 MG capsule Take one tablet twice daily x 5 days, then once  daily after that. 30 capsule 0  . ranitidine (ZANTAC) 150 MG tablet Take 150 mg by mouth 2 (two) times daily.     No current facility-administered medications on file prior to visit.    BP 125/84 mmHg  Pulse 77  Temp(Src) 98.8 F (37.1 C) (Oral)  Resp 16  Ht 5\' 5"  (1.651 m)  Wt   SpO2 100%  LMP 08/18/2014       Objective:   Physical Exam  Constitutional: She is oriented to person, place, and time. She appears well-developed and well-nourished. No distress.  HENT:  Head: Atraumatic. Macrocephalic.  Right Ear: Tympanic membrane and ear canal normal.  Left Ear: Tympanic membrane and ear canal normal.  Mouth/Throat: Posterior oropharyngeal erythema present. No oropharyngeal exudate or posterior oropharyngeal edema.  Neck: Thyromegaly present.  Cardiovascular: Normal rate and regular rhythm.   No murmur heard. Pulmonary/Chest: Effort normal and breath sounds normal. No respiratory distress. She has no wheezes. She has no rales. She exhibits no tenderness.  Musculoskeletal: She exhibits no edema.  Lymphadenopathy:    She has no cervical adenopathy.  Neurological: She is alert and oriented to person, place,  and time.  Skin: Skin is warm and dry.  Psychiatric: She has a normal mood and affect. Her behavior is normal. Judgment and thought content normal.          Assessment & Plan:

## 2014-10-01 NOTE — Assessment & Plan Note (Signed)
Rapid strep negative. Rx with viscous lidocaine and prn ibuprofen, follow up if symptoms worsen or if symptoms do not improve.

## 2014-10-03 ENCOUNTER — Ambulatory Visit (HOSPITAL_BASED_OUTPATIENT_CLINIC_OR_DEPARTMENT_OTHER): Payer: Self-pay

## 2014-10-04 ENCOUNTER — Ambulatory Visit (HOSPITAL_BASED_OUTPATIENT_CLINIC_OR_DEPARTMENT_OTHER)
Admission: RE | Admit: 2014-10-04 | Discharge: 2014-10-04 | Disposition: A | Discharge status: Home / Self Care | Payer: Self-pay | Source: Ambulatory Visit | Attending: Family | Admitting: Family

## 2014-10-04 DIAGNOSIS — R635 Abnormal weight gain: Secondary | ICD-10-CM | POA: Insufficient documentation

## 2014-10-04 DIAGNOSIS — L659 Nonscarring hair loss, unspecified: Secondary | ICD-10-CM | POA: Insufficient documentation

## 2014-10-04 DIAGNOSIS — E010 Iodine-deficiency related diffuse (endemic) goiter: Secondary | ICD-10-CM

## 2014-10-04 DIAGNOSIS — R05 Cough: Secondary | ICD-10-CM | POA: Insufficient documentation

## 2014-10-04 DIAGNOSIS — J029 Acute pharyngitis, unspecified: Secondary | ICD-10-CM | POA: Insufficient documentation

## 2014-10-04 DIAGNOSIS — E049 Nontoxic goiter, unspecified: Secondary | ICD-10-CM | POA: Insufficient documentation

## 2014-11-08 ENCOUNTER — Inpatient Hospital Stay (HOSPITAL_COMMUNITY)
Admission: AD | Admit: 2014-11-08 | Discharge: 2014-11-08 | Disposition: A | Discharge status: Home / Self Care | Payer: 59 | Source: Ambulatory Visit | Attending: Obstetrics and Gynecology | Admitting: Obstetrics and Gynecology

## 2014-11-08 ENCOUNTER — Encounter (HOSPITAL_COMMUNITY): Payer: Self-pay | Admitting: *Deleted

## 2014-11-08 DIAGNOSIS — A499 Bacterial infection, unspecified: Secondary | ICD-10-CM

## 2014-11-08 DIAGNOSIS — N760 Acute vaginitis: Secondary | ICD-10-CM | POA: Insufficient documentation

## 2014-11-08 DIAGNOSIS — R102 Pelvic and perineal pain: Secondary | ICD-10-CM

## 2014-11-08 DIAGNOSIS — K219 Gastro-esophageal reflux disease without esophagitis: Secondary | ICD-10-CM | POA: Insufficient documentation

## 2014-11-08 DIAGNOSIS — N949 Unspecified condition associated with female genital organs and menstrual cycle: Secondary | ICD-10-CM | POA: Insufficient documentation

## 2014-11-08 DIAGNOSIS — B9689 Other specified bacterial agents as the cause of diseases classified elsewhere: Secondary | ICD-10-CM

## 2014-11-08 LAB — WET PREP, GENITAL
Trich, Wet Prep: NONE SEEN
Yeast Wet Prep HPF POC: NONE SEEN

## 2014-11-08 LAB — CBC
HCT: 37.3 % (ref 36.0–46.0)
Hemoglobin: 13 g/dL (ref 12.0–15.0)
MCH: 29.2 pg (ref 26.0–34.0)
MCHC: 34.9 g/dL (ref 30.0–36.0)
MCV: 83.8 fL (ref 78.0–100.0)
Platelets: 346 10*3/uL (ref 150–400)
RBC: 4.45 MIL/uL (ref 3.87–5.11)
RDW: 12.9 % (ref 11.5–15.5)
WBC: 7.2 10*3/uL (ref 4.0–10.5)

## 2014-11-08 LAB — URINALYSIS, ROUTINE W REFLEX MICROSCOPIC
Bilirubin Urine: NEGATIVE
Glucose, UA: NEGATIVE mg/dL
HGB URINE DIPSTICK: NEGATIVE
Ketones, ur: NEGATIVE mg/dL
Leukocytes, UA: NEGATIVE
Nitrite: NEGATIVE
PROTEIN: NEGATIVE mg/dL
Specific Gravity, Urine: 1.025 (ref 1.005–1.030)
UROBILINOGEN UA: 0.2 mg/dL (ref 0.0–1.0)
pH: 6 (ref 5.0–8.0)

## 2014-11-08 LAB — RPR

## 2014-11-08 LAB — POCT PREGNANCY, URINE: Preg Test, Ur: NEGATIVE

## 2014-11-08 LAB — HIV ANTIBODY (ROUTINE TESTING W REFLEX): HIV 1&2 Ab, 4th Generation: NONREACTIVE

## 2014-11-08 MED ORDER — IBUPROFEN 600 MG PO TABS: 600.0000 mg | ORAL_TABLET | Freq: Four times a day (QID) | ORAL | Status: DC | PRN

## 2014-11-08 MED ORDER — DOXYCYCLINE HYCLATE 100 MG PO CAPS: 100.0000 mg | ORAL_CAPSULE | Freq: Two times a day (BID) | ORAL | Status: DC

## 2014-11-08 MED ORDER — METRONIDAZOLE 500 MG PO TABS: 500.0000 mg | ORAL_TABLET | Freq: Two times a day (BID) | ORAL | Status: DC

## 2014-11-08 NOTE — MAU Note (Signed)
Pt presents to MAU with complaints of lower abdominal pain and sharp pains in her right side. Pt has a history of ectopic pregnancy x 3 in the past. Pain with intercourse.

## 2014-11-08 NOTE — MAU Provider Note (Signed)
History     CSN: [id number]  Arrival date and time: 11/08/14 1539   First Provider Initiated Contact with Patient 11/08/14 1649      Chief Complaint  Patient presents with  . Pelvic Pain  . Vaginal Bleeding   HPI Stacy [name] is a 43 y.o. [id number]. LMP 11/28, on OCP's. She started having low pelvic pain/back pain/bloated earlier in the week. Had intercourse Wed, had spotting the next day. Now has RLQ pain, radiates down to thigh, very uncomfortable. Has not taken anything for pain. No changes in discharge or odor, sl irritation. No UTI S&S or GI changes. Nl BM yesterday.    OB History    Gravida Para Term Preterm AB TAB SAB Ectopic Multiple Living   6 2 2  0 3 0 0 3 0 2      Past Medical History  Diagnosis Date  . Esophageal stricture   . Chest pain 09/14/2009    no pulmonary embolus by chest CT  . Palpitations   . Elevated BP   . Anxiety     past Hx  . Depression     past Hx  . GERD (gastroesophageal reflux disease)     no meds  . Sickle cell trait   . Fibroid   . Abnormal Pap smear     cryo, normal since    Past Surgical History  Procedure Laterality Date  . Cesarean section  2004  . Laparoscopy for ectopic pregnancy  2001  . Wisdom tooth extraction  2012    Family History  Problem Relation Age of Onset  . Asthma Mother   . Hypertension Mother   . Hypertension Paternal Uncle   . Asthma Maternal Grandmother     History  Substance Use Topics  . Smoking status: Never Smoker   . Smokeless tobacco: Never Used  . Alcohol Use: No     Comment: social    Allergies:  Allergies  Allergen Reactions  . Diphenhydramine Hcl Nausea Only  . Hydromorphone Hcl Nausea And Vomiting  . Morphine Nausea And Vomiting    Prescriptions prior to admission  Medication Sig Dispense Refill Last Dose  . ALPRAZolam (XANAX) 0.5 MG tablet Take 1 tablet (0.5 mg total) by mouth at bedtime as needed for anxiety. 30 tablet 0 Taking  . lidocaine (XYLOCAINE) 2 % solution Use  as directed 20 mLs in the mouth or throat every 4 (four) hours as needed for mouth pain (gargle and spit). 200 mL 0   . omeprazole (PRILOSEC) 20 MG capsule Take one tablet twice daily x 5 days, then once daily after that. 30 capsule 0 Taking  . ranitidine (ZANTAC) 150 MG tablet Take 150 mg by mouth 2 (two) times daily.   Taking    Review of Systems  Constitutional: Negative for fever and chills.  Gastrointestinal: Positive for heartburn and abdominal pain. Negative for nausea, vomiting, diarrhea and constipation.  Genitourinary: Negative for dysuria, urgency and frequency.   Physical Exam   Blood pressure 132/80, pulse 82, temperature 98.6 F (37 C), resp. rate 18, height 5\' 4"  (1.626 m), weight 95.709 kg (211 lb), last menstrual period 10/25/2014.  Physical Exam  Nursing note and vitals reviewed. Constitutional: She is oriented to person, place, and time. She appears well-developed and well-nourished.  GI: Soft. Bowel sounds are normal. She exhibits no distension and no mass. There is tenderness. There is no rebound and no guarding.  Genitourinary:  Pelvic exam- Ext gen- nl anatomy, skin intact Vagina- small  amt thin white discharge Cx- parous, sl tender Uterus- sl tender, firm, sl enlarged Adn- no masses palp,sl tender bil  Neurological: She is alert and oriented to person, place, and time.  Skin: Skin is warm and dry.  Psychiatric: She has a normal mood and affect. Her behavior is normal.    MAU Course  Procedures  MDM Results for orders placed or performed during the hospital encounter of 11/08/14 (from the past 24 hour(s))  Urinalysis, Routine w reflex microscopic     Status: None   Collection Time: 11/08/14  4:10 PM  Result Value Ref Range   Color, Urine YELLOW YELLOW   APPearance CLEAR CLEAR   Specific Gravity, Urine 1.025 1.005 - 1.030   pH 6.0 5.0 - 8.0   Glucose, UA NEGATIVE NEGATIVE mg/dL   Hgb urine dipstick NEGATIVE NEGATIVE   Bilirubin Urine NEGATIVE  NEGATIVE   Ketones, ur NEGATIVE NEGATIVE mg/dL   Protein, ur NEGATIVE NEGATIVE mg/dL   Urobilinogen, UA 0.2 0.0 - 1.0 mg/dL   Nitrite NEGATIVE NEGATIVE   Leukocytes, UA NEGATIVE NEGATIVE  Pregnancy, urine POC     Status: None   Collection Time: 11/08/14  4:21 PM  Result Value Ref Range   Preg Test, Ur NEGATIVE NEGATIVE  Wet prep, genital     Status: Abnormal   Collection Time: 11/08/14  5:12 PM  Result Value Ref Range   Yeast Wet Prep HPF POC NONE SEEN NONE SEEN   Trich, Wet Prep NONE SEEN NONE SEEN   Clue Cells Wet Prep HPF POC MANY (A) NONE SEEN   WBC, Wet Prep HPF POC FEW (A) NONE SEEN  CBC     Status: None   Collection Time: 11/08/14  5:15 PM  Result Value Ref Range   WBC 7.2 4.0 - 10.5 K/uL   RBC 4.45 3.87 - 5.11 MIL/uL   Hemoglobin 13.0 12.0 - 15.0 g/dL   HCT 37.3 36.0 - 46.0 %   MCV 83.8 78.0 - 100.0 fL   MCH 29.2 26.0 - 34.0 pg   MCHC 34.9 30.0 - 36.0 g/dL   RDW 12.9 11.5 - 15.5 %   Platelets 346 150 - 400 K/uL  RPR     Status: None   Collection Time: 11/08/14  5:15 PM  Result Value Ref Range   RPR NON REAC NON REAC  HIV antibody     Status: None   Collection Time: 11/08/14  5:15 PM  Result Value Ref Range   HIV 1&2 Ab, 4th Generation NONREACTIVE NONREACTIVE     Assessment and Plan  BV ? Endometritis/ adenomyosis-   Flagyl 500 mg bid x 7 Doxycycline 100 bid x7 Motrin for pain Keep appointment Wednesday in the office with Dr Matthew Saras Consulted with Dr Peggyann Shoals, [facility] M. 11/08/2014, 4:56 PM

## 2014-11-10 LAB — GC/CHLAMYDIA PROBE AMP
CT Probe RNA: NEGATIVE
GC Probe RNA: NEGATIVE

## 2015-01-29 ENCOUNTER — Other Ambulatory Visit: Payer: Self-pay | Admitting: Obstetrics & Gynecology

## 2015-01-29 ENCOUNTER — Other Ambulatory Visit (HOSPITAL_BASED_OUTPATIENT_CLINIC_OR_DEPARTMENT_OTHER): Payer: Self-pay | Admitting: Obstetrics & Gynecology

## 2015-01-29 ENCOUNTER — Encounter (HOSPITAL_BASED_OUTPATIENT_CLINIC_OR_DEPARTMENT_OTHER): Payer: Self-pay

## 2015-01-29 ENCOUNTER — Emergency Department (HOSPITAL_BASED_OUTPATIENT_CLINIC_OR_DEPARTMENT_OTHER)
Admission: EM | Admit: 2015-01-29 | Discharge: 2015-01-29 | Disposition: A | Discharge status: Home / Self Care | Payer: BLUE CROSS/BLUE SHIELD | Attending: Emergency Medicine | Admitting: Emergency Medicine

## 2015-01-29 DIAGNOSIS — F419 Anxiety disorder, unspecified: Secondary | ICD-10-CM | POA: Insufficient documentation

## 2015-01-29 DIAGNOSIS — R1084 Generalized abdominal pain: Secondary | ICD-10-CM | POA: Diagnosis not present

## 2015-01-29 DIAGNOSIS — K219 Gastro-esophageal reflux disease without esophagitis: Secondary | ICD-10-CM | POA: Diagnosis not present

## 2015-01-29 DIAGNOSIS — Z79899 Other long term (current) drug therapy: Secondary | ICD-10-CM | POA: Diagnosis not present

## 2015-01-29 DIAGNOSIS — Z792 Long term (current) use of antibiotics: Secondary | ICD-10-CM | POA: Insufficient documentation

## 2015-01-29 DIAGNOSIS — R197 Diarrhea, unspecified: Secondary | ICD-10-CM | POA: Insufficient documentation

## 2015-01-29 DIAGNOSIS — R109 Unspecified abdominal pain: Secondary | ICD-10-CM | POA: Diagnosis present

## 2015-01-29 DIAGNOSIS — Z9889 Other specified postprocedural states: Secondary | ICD-10-CM | POA: Insufficient documentation

## 2015-01-29 DIAGNOSIS — Z8742 Personal history of other diseases of the female genital tract: Secondary | ICD-10-CM | POA: Diagnosis not present

## 2015-01-29 DIAGNOSIS — Z1231 Encounter for screening mammogram for malignant neoplasm of breast: Secondary | ICD-10-CM

## 2015-01-29 DIAGNOSIS — F329 Major depressive disorder, single episode, unspecified: Secondary | ICD-10-CM | POA: Diagnosis not present

## 2015-01-29 DIAGNOSIS — R112 Nausea with vomiting, unspecified: Secondary | ICD-10-CM | POA: Diagnosis not present

## 2015-01-29 DIAGNOSIS — Z862 Personal history of diseases of the blood and blood-forming organs and certain disorders involving the immune mechanism: Secondary | ICD-10-CM | POA: Insufficient documentation

## 2015-01-29 DIAGNOSIS — Z3202 Encounter for pregnancy test, result negative: Secondary | ICD-10-CM | POA: Diagnosis not present

## 2015-01-29 LAB — URINALYSIS, ROUTINE W REFLEX MICROSCOPIC
Bilirubin Urine: NEGATIVE
Glucose, UA: NEGATIVE mg/dL
Hgb urine dipstick: NEGATIVE
Ketones, ur: NEGATIVE mg/dL
LEUKOCYTES UA: NEGATIVE
NITRITE: NEGATIVE
Protein, ur: NEGATIVE mg/dL
Specific Gravity, Urine: 1.01 (ref 1.005–1.030)
UROBILINOGEN UA: 0.2 mg/dL (ref 0.0–1.0)
pH: 6 (ref 5.0–8.0)

## 2015-01-29 LAB — PREGNANCY, URINE: PREG TEST UR: NEGATIVE

## 2015-01-29 LAB — CBC WITH DIFFERENTIAL/PLATELET
BASOS ABS: 0 10*3/uL (ref 0.0–0.1)
Basophils Relative: 0 % (ref 0–1)
Eosinophils Absolute: 0.1 10*3/uL (ref 0.0–0.7)
Eosinophils Relative: 1 % (ref 0–5)
HCT: 37.8 % (ref 36.0–46.0)
HEMOGLOBIN: 13 g/dL (ref 12.0–15.0)
Lymphocytes Relative: 18 % (ref 12–46)
Lymphs Abs: 1.3 10*3/uL (ref 0.7–4.0)
MCH: 28.3 pg (ref 26.0–34.0)
MCHC: 34.4 g/dL (ref 30.0–36.0)
MCV: 82.2 fL (ref 78.0–100.0)
Monocytes Absolute: 0.6 10*3/uL (ref 0.1–1.0)
Monocytes Relative: 8 % (ref 3–12)
Neutro Abs: 5.1 10*3/uL (ref 1.7–7.7)
Neutrophils Relative %: 73 % (ref 43–77)
Platelets: 371 10*3/uL (ref 150–400)
RBC: 4.6 MIL/uL (ref 3.87–5.11)
RDW: 12.6 % (ref 11.5–15.5)
WBC: 7 10*3/uL (ref 4.0–10.5)

## 2015-01-29 LAB — COMPREHENSIVE METABOLIC PANEL
ALT: 14 U/L (ref 0–35)
AST: 20 U/L (ref 0–37)
Albumin: 4.1 g/dL (ref 3.5–5.2)
Alkaline Phosphatase: 51 U/L (ref 39–117)
Anion gap: 4 — ABNORMAL LOW (ref 5–15)
BUN: 10 mg/dL (ref 6–23)
CALCIUM: 8.5 mg/dL (ref 8.4–10.5)
CO2: 26 mmol/L (ref 19–32)
Chloride: 105 mmol/L (ref 96–112)
Creatinine, Ser: 0.63 mg/dL (ref 0.50–1.10)
GFR calc Af Amer: >90 mL/min (ref >90)
GFR calc non Af Amer: >90 mL/min (ref >90)
Glucose, Bld: 103 mg/dL — ABNORMAL HIGH (ref 70–99)
Potassium: 3.4 mmol/L — ABNORMAL LOW (ref 3.5–5.1)
Sodium: 135 mmol/L (ref 135–145)
TOTAL PROTEIN: 8 g/dL (ref 6.0–8.3)
Total Bilirubin: 0.5 mg/dL (ref 0.3–1.2)

## 2015-01-29 LAB — LIPASE, BLOOD: Lipase: 24 U/L (ref 11–59)

## 2015-01-29 MED ORDER — SODIUM CHLORIDE 0.9 % IV BOLUS (SEPSIS)
1000.0000 mL | Freq: Once | INTRAVENOUS | Status: AC
  Administered 2015-01-29: 1000 mL via INTRAVENOUS

## 2015-01-29 MED ORDER — DICYCLOMINE HCL 20 MG PO TABS: 20.0000 mg | ORAL_TABLET | Freq: Two times a day (BID) | ORAL | Status: DC | PRN

## 2015-01-29 MED ORDER — ONDANSETRON HCL 4 MG/2ML IJ SOLN
4.0000 mg | Freq: Once | INTRAMUSCULAR | Status: AC
  Administered 2015-01-29: 4 mg via INTRAVENOUS
  Filled 2015-01-29: qty 2

## 2015-01-29 MED ORDER — LOPERAMIDE HCL 2 MG PO CAPS: 2.0000 mg | ORAL_CAPSULE | Freq: Four times a day (QID) | ORAL | Status: DC | PRN

## 2015-01-29 MED ORDER — ONDANSETRON HCL 4 MG PO TABS: 4.0000 mg | ORAL_TABLET | Freq: Three times a day (TID) | ORAL | Status: DC | PRN

## 2015-01-29 MED ORDER — POTASSIUM CHLORIDE CRYS ER 20 MEQ PO TBCR
20.0000 meq | EXTENDED_RELEASE_TABLET | Freq: Once | ORAL | Status: AC
  Administered 2015-01-29: 20 meq via ORAL
  Filled 2015-01-29: qty 1

## 2015-01-29 MED ORDER — FENTANYL CITRATE 0.05 MG/ML IJ SOLN: 50.0000 ug | Freq: Once | INTRAMUSCULAR | Status: DC

## 2015-01-29 MED ORDER — KETOROLAC TROMETHAMINE 30 MG/ML IJ SOLN
30.0000 mg | Freq: Once | INTRAMUSCULAR | Status: AC
  Administered 2015-01-29: 30 mg via INTRAVENOUS
  Filled 2015-01-29: qty 1

## 2015-01-29 NOTE — Discharge Instructions (Signed)
Read the information below.  Use the prescribed medication as directed.  Please discuss all new medications with your pharmacist.  You may return to the Emergency Department at any time for worsening condition or any new symptoms that concern you.  If you develop high fevers, worsening abdominal pain, uncontrolled vomiting, or are unable to tolerate fluids by mouth, return to the ER for a recheck.    Abdominal Pain Many things can cause abdominal pain. Usually, abdominal pain is not caused by a disease and will improve without treatment. It can often be observed and treated at home. Your health care provider will do a physical exam and possibly order blood tests and X-rays to help determine the seriousness of your pain. However, in many cases, more time must pass before a clear cause of the pain can be found. Before that point, your health care provider may not know if you need more testing or further treatment. HOME CARE INSTRUCTIONS  Monitor your abdominal pain for any changes. The following actions may help to alleviate any discomfort you are experiencing:  Only take over-the-counter or prescription medicines as directed by your health care provider.  Do not take laxatives unless directed to do so by your health care provider.  Try a clear liquid diet (broth, tea, or water) as directed by your health care provider. Slowly move to a bland diet as tolerated. SEEK MEDICAL CARE IF:  You have unexplained abdominal pain.  You have abdominal pain associated with nausea or diarrhea.  You have pain when you urinate or have a bowel movement.  You experience abdominal pain that wakes you in the night.  You have abdominal pain that is worsened or improved by eating food.  You have abdominal pain that is worsened with eating fatty foods.  You have a fever. SEEK IMMEDIATE MEDICAL CARE IF:   Your pain does not go away within 2 hours.  You keep throwing up (vomiting).  Your pain is felt only in  portions of the abdomen, such as the right side or the left lower portion of the abdomen.  You pass bloody or black tarry stools. MAKE SURE YOU:  Understand these instructions.   Will watch your condition.   Will get help right away if you are not doing well or get worse.  Document Released: 08/24/2005 Document Revised: 11/19/2013 Document Reviewed: 07/24/2013 [facility] Patient Information 2015 [place], [place]. This information is not intended to replace advice given to you by your health care provider. Make sure you discuss any questions you have with your health care provider.

## 2015-01-29 NOTE — ED Notes (Signed)
Pt. Reports she cant urinate at this time.

## 2015-01-29 NOTE — ED Provider Notes (Signed)
CSN: [id number]     Arrival date & time 01/29/15  1617 History   First MD Initiated Contact with Patient 01/29/15 1642     Chief Complaint  Patient presents with  . Abdominal Pain     (Consider location/radiation/quality/duration/timing/severity/associated sxs/prior Treatment) HPI   Patient presents with abdominal pain, N/V/D, that began approximately 2 hours ago, 45 minutes after eating lunch.  Has had at least 10 episodes of vomiting, emesis is contents of her stomach and multiple episodes of diarrhea.  Neither emesis nor diarrhea is bloody.  Denies fevers, urinary symptoms.  Does not have known sick contacts but has been around a lot of people at work and has received emails that others are sick.  No hx abdominal surgeries.   Past Medical History  Diagnosis Date  . Esophageal stricture   . Chest pain 09/14/2009    no pulmonary embolus by chest CT  . Palpitations   . Elevated BP   . Anxiety     past Hx  . Depression     past Hx  . GERD (gastroesophageal reflux disease)     no meds  . Sickle cell trait   . Fibroid   . Abnormal Pap smear     cryo, normal since   Past Surgical History  Procedure Laterality Date  . Cesarean section  2004  . Laparoscopy for ectopic pregnancy  2001  . Wisdom tooth extraction  2012   Family History  Problem Relation Age of Onset  . Asthma Mother   . Hypertension Mother   . Hypertension Paternal Uncle   . Asthma Maternal Grandmother    History  Substance Use Topics  . Smoking status: Never Smoker   . Smokeless tobacco: Never Used  . Alcohol Use: No     Comment: social   OB History    Gravida Para Term Preterm AB TAB SAB Ectopic Multiple Living   6 2 2  0 3 0 0 3 0 2     Review of Systems  All other systems reviewed and are negative.     Allergies  Diphenhydramine hcl; Hydromorphone hcl; and Morphine  Home Medications   Prior to Admission medications   Medication Sig Start Date End Date Taking? Authorizing Provider   ALPRAZolam Duanne Moron) 0.5 MG tablet Take 1 tablet (0.5 mg total) by mouth at bedtime as needed for anxiety. 07/14/14   Brunetta Jeans, PA-C  doxycycline (VIBRAMYCIN) 100 MG capsule Take 1 capsule (100 mg total) by mouth 2 (two) times daily. 11/08/14   Elesa Massed, NP  esomeprazole (NEXIUM) 20 MG capsule Take 20 mg by mouth daily at 12 noon.    Historical Provider, MD  famotidine (PEPCID AC) 10 MG chewable tablet Chew 10 mg by mouth 2 (two) times daily as needed for heartburn.    Historical Provider, MD  ibuprofen (ADVIL,MOTRIN) 600 MG tablet Take 1 tablet (600 mg total) by mouth every 6 (six) hours as needed for cramping. 11/08/14   Elesa Massed, NP  lidocaine (XYLOCAINE) 2 % solution Use as directed 20 mLs in the mouth or throat every 4 (four) hours as needed for mouth pain (gargle and spit). Patient not taking: Reported on 11/08/2014 10/01/14   Debbrah Alar, NP  metroNIDAZOLE (FLAGYL) 500 MG tablet Take 1 tablet (500 mg total) by mouth 2 (two) times daily. 11/08/14   Elesa Massed, NP  omeprazole (PRILOSEC) 20 MG capsule Take one tablet twice daily x 5 days, then once daily after that. Patient  not taking: Reported on 11/08/2014 04/06/14   Shari A Upstill, PA-C   BP 151/82 mmHg  Pulse 89  Temp(Src) 98.4 F (36.9 C) (Oral)  Resp 18  Ht 5\' 6"  (1.676 m)  Wt 201 lb (91.173 kg)  BMI 32.46 kg/m2  SpO2 100%  LMP 01/11/2015  Breastfeeding? Unknown Physical Exam  Constitutional: She appears well-developed and well-nourished. No distress.  HENT:  Head: Normocephalic and atraumatic.  Neck: Neck supple.  Cardiovascular: Normal rate and regular rhythm.   Pulmonary/Chest: Effort normal and breath sounds normal. No respiratory distress. She has no wheezes. She has no rales.  Abdominal: Soft. She exhibits no distension, no pulsatile midline mass and no mass. There is tenderness in the epigastric area and suprapubic area. There is no rebound and no guarding.  obese  Neurological: She is  alert.  Skin: She is not diaphoretic.  Nursing note and vitals reviewed.   ED Course  Procedures (including critical care time) Labs Review Labs Reviewed  URINALYSIS, ROUTINE W REFLEX MICROSCOPIC - Abnormal; Notable for the following:    APPearance CLOUDY (*)    All other components within normal limits  COMPREHENSIVE METABOLIC PANEL - Abnormal; Notable for the following:    Potassium 3.4 (*)    Glucose, Bld 103 (*)    Anion gap 4 (*)    All other components within normal limits  PREGNANCY, URINE  CBC WITH DIFFERENTIAL/PLATELET  LIPASE, BLOOD    Imaging Review No results found.   EKG Interpretation None       6:28 PM Patient reports great improvement after medication.  Reexamination of the abdomen continues to be benign/nonsurgical.  Soft, nondistended, diffuse mild tenderness without guarding or rebound.   MDM   Final diagnoses:  Nausea vomiting and diarrhea  Generalized abdominal pain    Afebrile, nontoxic patient with abdominal pain, N/V/D.  Labs unremarkable.  Pt feeling much better after IVF, toradol, zofran.  Abdominal exam and repeat exam benign.  Given high rate of viral gastroenteritis in the community, this is likely etiology, though I did discuss return precautions with patient given the fact that she presented early in the course of her illness. D/C home with bentyl, zofran, imodium, PCP follow up.  Discussed result, findings, treatment, and follow up  with patient.  Pt given return precautions.  Pt verbalizes understanding and agrees with plan.         [place], PA-C 01/29/15 1846  Sherwood Gambler, MD 02/03/15 [phone]

## 2015-01-29 NOTE — ED Notes (Signed)
Reports diffuse abdominal pain x 1 hour. Reports feeling like something "burst" Reports nausea, vomiting and diarrhea.

## 2015-01-30 ENCOUNTER — Other Ambulatory Visit: Payer: Self-pay | Admitting: Obstetrics & Gynecology

## 2015-01-30 DIAGNOSIS — N644 Mastodynia: Secondary | ICD-10-CM

## 2015-02-04 ENCOUNTER — Ambulatory Visit: Payer: Medicaid Other

## 2015-02-05 ENCOUNTER — Ambulatory Visit: Payer: BLUE CROSS/BLUE SHIELD

## 2015-02-05 ENCOUNTER — Other Ambulatory Visit: Payer: BLUE CROSS/BLUE SHIELD

## 2015-02-06 ENCOUNTER — Other Ambulatory Visit: Payer: BLUE CROSS/BLUE SHIELD

## 2015-04-14 ENCOUNTER — Ambulatory Visit: Payer: BLUE CROSS/BLUE SHIELD | Admitting: Family

## 2015-04-16 ENCOUNTER — Ambulatory Visit
Admission: RE | Admit: 2015-04-16 | Discharge: 2015-04-16 | Disposition: A | Discharge status: Home / Self Care | Payer: BLUE CROSS/BLUE SHIELD | Source: Ambulatory Visit | Attending: Obstetrics & Gynecology | Admitting: Obstetrics & Gynecology

## 2015-04-16 DIAGNOSIS — N644 Mastodynia: Secondary | ICD-10-CM

## 2015-04-17 ENCOUNTER — Ambulatory Visit (INDEPENDENT_AMBULATORY_CARE_PROVIDER_SITE_OTHER): Payer: BLUE CROSS/BLUE SHIELD | Admitting: Physician Assistant

## 2015-04-17 ENCOUNTER — Encounter: Payer: Self-pay | Admitting: Physician Assistant

## 2015-04-17 VITALS — BP 122/84 | HR 83 | Temp 98.4°F | Ht 65.0 in | Wt 203.4 lb

## 2015-04-17 DIAGNOSIS — K219 Gastro-esophageal reflux disease without esophagitis: Secondary | ICD-10-CM

## 2015-04-17 DIAGNOSIS — E669 Obesity, unspecified: Secondary | ICD-10-CM | POA: Insufficient documentation

## 2015-04-17 DIAGNOSIS — R635 Abnormal weight gain: Secondary | ICD-10-CM

## 2015-04-17 DIAGNOSIS — Z91048 Other nonmedicinal substance allergy status: Secondary | ICD-10-CM | POA: Diagnosis not present

## 2015-04-17 DIAGNOSIS — Z9109 Other allergy status, other than to drugs and biological substances: Secondary | ICD-10-CM | POA: Insufficient documentation

## 2015-04-17 LAB — TSH: TSH: 1.57 u[IU]/mL (ref 0.35–4.50)

## 2015-04-17 NOTE — Patient Instructions (Signed)
Please obtain labs today -- we are checking your thyroid. If abnormal we will treat accordingly.  If normal we can consider medication for weight loss. Continue diet and exercise regimen.  For cough and drainage -- this seems related to environmental/seasonal allergies. Start a claritin daily and use saline nasal spray to flush out sinuses.  Giving your history of GI issues I have placed a referral to [facility] for repeat assessment of uncontrolled acid reflux.

## 2015-04-17 NOTE — Assessment & Plan Note (Signed)
Suspect reason for recurrence of cough this week.  Exam with rhinorrhea and mucosal edema.  Otherwise normal.  Begin saline nasal spray and Claritin daily.

## 2015-04-17 NOTE — Assessment & Plan Note (Signed)
Unclear etiology.  Will check TSH level.  Recent US thyroid normal. Continue diet and exercise.  If all labs normal, will consider adding phentermine with close follow-up.

## 2015-04-17 NOTE — Progress Notes (Signed)
Patient presents to clinic today to discuss weight gain over the past several months.  Endorses family history of hypothyroidism.  Endorses mild constipation.  Is dieting and seeing a personal trainer but without noted weight loss.   Patient also requesting referral to [facility] for uncontrolled acid reflux.  Endorses continued symptoms despite multiple drug regimens.  Notes she gets best result with Pepcid twice daily.  Endorses last EGD 2014. Is having a chronic cough that comes and goes.  Endorses recurrence over past week.  Endorses mild PND and rhinorrhea but denies other URI symptoms.  Denies smoking history.  Past Medical History  Diagnosis Date  . Esophageal stricture   . Chest pain 09/14/2009    no pulmonary embolus by chest CT  . Palpitations   . Elevated BP   . Anxiety     past Hx  . Depression     past Hx  . GERD (gastroesophageal reflux disease)     no meds  . Sickle cell trait   . Fibroid   . Abnormal Pap smear     cryo, normal since    Current Outpatient Prescriptions on File Prior to Visit  Medication Sig Dispense Refill  . famotidine (PEPCID AC) 10 MG chewable tablet Chew 10 mg by mouth 2 (two) times daily as needed for heartburn.    . ALPRAZolam (XANAX) 0.5 MG tablet Take 1 tablet (0.5 mg total) by mouth at bedtime as needed for anxiety. (Patient not taking: Reported on 04/17/2015) 30 tablet 0   No current facility-administered medications on file prior to visit.    Allergies  Allergen Reactions  . Diphenhydramine Hcl Nausea Only  . Hydromorphone Hcl Nausea And Vomiting  . Morphine Nausea And Vomiting    Family History  Problem Relation Age of Onset  . Asthma Mother   . Hypertension Mother   . Hypertension Paternal Uncle   . Asthma Maternal Grandmother     History   Social History  . Marital Status: Married    Spouse Name: N/A  . Number of Children: 2  . Years of Education: N/A   Occupational History  .  [employer]   Social History Main  Topics  . Smoking status: Never Smoker   . Smokeless tobacco: Never Used  . Alcohol Use: No     Comment: social  . Drug Use: No  . Sexual Activity: Not on file   Other Topics Concern  . None   Social History Narrative   Review of Systems - See HPI.  All other ROS are negative.  BP 122/84 mmHg  Pulse 83  Temp(Src) 98.4 F (36.9 C) (Oral)  Ht 5' 5"  (1.651 m)  Wt 203 lb 6.4 oz (92.262 kg)  BMI 33.85 kg/m2  SpO2 98%  LMP 04/08/2015 (Approximate)  Physical Exam  Constitutional: She is oriented to person, place, and time and well-developed, well-nourished, and in no distress.  HENT:  Head: Normocephalic and atraumatic.  Right Ear: Tympanic membrane, external ear and ear canal normal.  Left Ear: Tympanic membrane, external ear and ear canal normal.  Nose: Mucosal edema and rhinorrhea present. No epistaxis. Right sinus exhibits no maxillary sinus tenderness and no frontal sinus tenderness. Left sinus exhibits no maxillary sinus tenderness and no frontal sinus tenderness.  Mouth/Throat: Uvula is midline, oropharynx is clear and moist and mucous membranes are normal.  Neck: Neck supple. No thyromegaly present.  Cardiovascular: Normal rate, regular rhythm, normal heart sounds and intact distal pulses.   Pulmonary/Chest:  Effort normal and breath sounds normal. No respiratory distress. She has no wheezes. She has no rales. She exhibits no tenderness.  Lymphadenopathy:    She has no cervical adenopathy.  Neurological: She is alert and oriented to person, place, and time.  Skin: Skin is warm and dry. No rash noted.  Psychiatric: Affect normal.  Vitals reviewed.   Recent Results (from the past 2160 hour(s))  CBC with Differential     Status: None   Collection Time: 01/29/15  5:20 PM  Result Value Ref Range   WBC 7.0 4.0 - 10.5 K/uL   RBC 4.60 3.87 - 5.11 MIL/uL   Hemoglobin 13.0 12.0 - 15.0 g/dL   HCT 37.8 36.0 - 46.0 %   MCV 82.2 78.0 - 100.0 fL   MCH 28.3 26.0 - 34.0 pg   MCHC  34.4 30.0 - 36.0 g/dL   RDW 12.6 11.5 - 15.5 %   Platelets 371 150 - 400 K/uL   Neutrophils Relative % 73 43 - 77 %   Neutro Abs 5.1 1.7 - 7.7 K/uL   Lymphocytes Relative 18 12 - 46 %   Lymphs Abs 1.3 0.7 - 4.0 K/uL   Monocytes Relative 8 3 - 12 %   Monocytes Absolute 0.6 0.1 - 1.0 K/uL   Eosinophils Relative 1 0 - 5 %   Eosinophils Absolute 0.1 0.0 - 0.7 K/uL   Basophils Relative 0 0 - 1 %   Basophils Absolute 0.0 0.0 - 0.1 K/uL  Comprehensive metabolic panel     Status: Abnormal   Collection Time: 01/29/15  5:20 PM  Result Value Ref Range   Sodium 135 135 - 145 mmol/L   Potassium 3.4 (L) 3.5 - 5.1 mmol/L   Chloride 105 96 - 112 mmol/L   CO2 26 19 - 32 mmol/L   Glucose, Bld 103 (H) 70 - 99 mg/dL   BUN 10 6 - 23 mg/dL   Creatinine, Ser 0.63 0.50 - 1.10 mg/dL   Calcium 8.5 8.4 - 10.5 mg/dL   Total Protein 8.0 6.0 - 8.3 g/dL   Albumin 4.1 3.5 - 5.2 g/dL   AST 20 0 - 37 U/L   ALT 14 0 - 35 U/L   Alkaline Phosphatase 51 39 - 117 U/L   Total Bilirubin 0.5 0.3 - 1.2 mg/dL   GFR calc non Af Amer >90 >90 mL/min   GFR calc Af Amer >90 >90 mL/min    Comment: (NOTE) The eGFR has been calculated using the CKD EPI equation. This calculation has not been validated in all clinical situations. eGFR's persistently <90 mL/min signify possible Chronic Kidney Disease.    Anion gap 4 (L) 5 - 15  Lipase, blood     Status: None   Collection Time: 01/29/15  5:20 PM  Result Value Ref Range   Lipase 24 11 - 59 U/L  Urinalysis, Routine w reflex microscopic     Status: Abnormal   Collection Time: 01/29/15  6:15 PM  Result Value Ref Range   Color, Urine YELLOW YELLOW   APPearance CLOUDY (A) CLEAR   Specific Gravity, Urine 1.010 1.005 - 1.030   pH 6.0 5.0 - 8.0   Glucose, UA NEGATIVE NEGATIVE mg/dL   Hgb urine dipstick NEGATIVE NEGATIVE   Bilirubin Urine NEGATIVE NEGATIVE   Ketones, ur NEGATIVE NEGATIVE mg/dL   Protein, ur NEGATIVE NEGATIVE mg/dL   Urobilinogen, UA 0.2 0.0 - 1.0 mg/dL    Nitrite NEGATIVE NEGATIVE   Leukocytes, UA NEGATIVE NEGATIVE  Comment: MICROSCOPIC NOT DONE ON URINES WITH NEGATIVE PROTEIN, BLOOD, LEUKOCYTES, NITRITE, OR GLUCOSE <1000 mg/dL.  Pregnancy, urine     Status: None   Collection Time: 01/29/15  6:15 PM  Result Value Ref Range   Preg Test, Ur NEGATIVE NEGATIVE    Comment:        THE SENSITIVITY OF THIS METHODOLOGY IS >20 mIU/mL.     Assessment/Plan: Weight gain Unclear etiology.  Will check TSH level.  Recent US thyroid normal. Continue diet and exercise.  If all labs normal, will consider adding phentermine with close follow-up.   GERD Referral placed to [facility] for chronic uncontrolled symptoms.  Diet regimen discussed.  Continue Pepcid BID.  TUMS at bedtime.   Environmental allergies Suspect reason for recurrence of cough this week.  Exam with rhinorrhea and mucosal edema.  Otherwise normal.  Begin saline nasal spray and Claritin daily.

## 2015-04-17 NOTE — Assessment & Plan Note (Signed)
Referral placed to [facility] for chronic uncontrolled symptoms.  Diet regimen discussed.  Continue Pepcid BID.  TUMS at bedtime.

## 2015-04-20 ENCOUNTER — Telehealth: Payer: Self-pay | Admitting: *Deleted

## 2015-04-20 MED ORDER — PHENTERMINE HCL 30 MG PO CAPS: 30.0000 mg | ORAL_CAPSULE | ORAL | Status: DC

## 2015-04-20 NOTE — Telephone Encounter (Signed)
Rx faxed to [place].  Confirmation received.//AB/CMA

## 2015-04-20 NOTE — Telephone Encounter (Signed)
-----   Message from Brunetta Jeans, PA-C sent at 04/17/2015  2:40 PM EDT ----- Thyroid function is normal. Should not be contributing to symptoms.  Recommend she get into her routine with her trainer and if weight isn't coming off in next few weeks we can consider a medication to help with weight loss.

## 2015-04-20 NOTE — Telephone Encounter (Signed)
Called and spoke with the pt and informed her of recent lab results and note below.  Pt verbalized understanding,but stated that she already sees a trainer and she was told she would be started on the medication to help with weight loss.  Informed the pt that Einar Pheasant would like to see how she does with the trainer for a few weeks first and then if needed will prescribe medication.  Pt stated that the reason she came to the doctor was to be started on medication,but she will go to the weight loss program she has signed up for.  Verbally informed Einar Pheasant of what the pt stated and he agreed to give the pt #30,but she will need to schedule an appt to see her PCP to see if she will continue the medication.  Informed the pt of Cody's verbal message.  Pt agreed.//AB/CMA

## 2015-04-20 NOTE — Telephone Encounter (Signed)
Rx printed. Please phone in to patient's pharmacy.

## 2015-05-09 ENCOUNTER — Inpatient Hospital Stay (HOSPITAL_COMMUNITY)
Admission: AD | Admit: 2015-05-09 | Discharge: 2015-05-09 | Disposition: A | Discharge status: Home / Self Care | Payer: BLUE CROSS/BLUE SHIELD | Source: Ambulatory Visit | Attending: Obstetrics and Gynecology | Admitting: Obstetrics and Gynecology

## 2015-05-09 DIAGNOSIS — N390 Urinary tract infection, site not specified: Secondary | ICD-10-CM | POA: Diagnosis not present

## 2015-05-09 DIAGNOSIS — K219 Gastro-esophageal reflux disease without esophagitis: Secondary | ICD-10-CM | POA: Insufficient documentation

## 2015-05-09 DIAGNOSIS — Z885 Allergy status to narcotic agent status: Secondary | ICD-10-CM | POA: Diagnosis not present

## 2015-05-09 DIAGNOSIS — L299 Pruritus, unspecified: Secondary | ICD-10-CM | POA: Diagnosis present

## 2015-05-09 DIAGNOSIS — R102 Pelvic and perineal pain: Secondary | ICD-10-CM

## 2015-05-09 DIAGNOSIS — D573 Sickle-cell trait: Secondary | ICD-10-CM | POA: Diagnosis not present

## 2015-05-09 LAB — URINE MICROSCOPIC-ADD ON

## 2015-05-09 LAB — WET PREP, GENITAL
Clue Cells Wet Prep HPF POC: NONE SEEN
TRICH WET PREP: NONE SEEN
Yeast Wet Prep HPF POC: NONE SEEN

## 2015-05-09 LAB — URINALYSIS, ROUTINE W REFLEX MICROSCOPIC
Bilirubin Urine: NEGATIVE
Glucose, UA: NEGATIVE mg/dL
KETONES UR: NEGATIVE mg/dL
LEUKOCYTES UA: NEGATIVE
Nitrite: NEGATIVE
PH: 6 (ref 5.0–8.0)
PROTEIN: NEGATIVE mg/dL
Specific Gravity, Urine: 1.025 (ref 1.005–1.030)
UROBILINOGEN UA: 0.2 mg/dL (ref 0.0–1.0)

## 2015-05-09 LAB — POCT PREGNANCY, URINE: Preg Test, Ur: NEGATIVE

## 2015-05-09 MED ORDER — CEPHALEXIN 500 MG PO CAPS: 500.0000 mg | ORAL_CAPSULE | Freq: Two times a day (BID) | ORAL | Status: AC

## 2015-05-09 NOTE — MAU Provider Note (Signed)
Stacy [name] is a 44 y.o. non pregnant c/o vaginal itching and pain.   History     Patient Active Problem List   Diagnosis Date Noted  . Weight gain 04/17/2015  . Environmental allergies 04/17/2015  . Viral pharyngitis 10/01/2014  . Thyromegaly 10/01/2014  . Acute bronchitis 07/14/2014  . Edema 11/07/2013  . Atypical chest pain 04/30/2013  . Mastalgia 01/10/2013  . FATIGUE 05/24/2010  . VITAMIN D DEFICIENCY 09/30/2009  . PALPITATIONS, RECURRENT 09/30/2009  . CHEST PAIN, ATYPICAL, HX OF 09/30/2009  . ANXIETY STATE, UNSPECIFIED 09/28/2009  . DEPRESSION 09/28/2009  . GERD 09/28/2009  . ELEVATED BLOOD PRESSURE WITHOUT DIAGNOSIS OF HYPERTENSION 09/28/2009    Chief Complaint  Patient presents with  . Vaginitis   HPI  OB History    Gravida Para Term Preterm AB TAB SAB Ectopic Multiple Living   6 2 2  0 3 0 0 3 0 2      Past Medical History  Diagnosis Date  . Esophageal stricture   . Chest pain 09/14/2009    no pulmonary embolus by chest CT  . Palpitations   . Elevated BP   . Anxiety     past Hx  . Depression     past Hx  . GERD (gastroesophageal reflux disease)     no meds  . Sickle cell trait   . Fibroid   . Abnormal Pap smear     cryo, normal since    Past Surgical History  Procedure Laterality Date  . Cesarean section  2004  . Laparoscopy for ectopic pregnancy  2001  . Wisdom tooth extraction  2012    Family History  Problem Relation Age of Onset  . Asthma Mother   . Hypertension Mother   . Hypertension Paternal Uncle   . Asthma Maternal Grandmother     History  Substance Use Topics  . Smoking status: Never Smoker   . Smokeless tobacco: Never Used  . Alcohol Use: No     Comment: social    Allergies:  Allergies  Allergen Reactions  . Diphenhydramine Hcl Nausea Only  . Hydromorphone Hcl Nausea And Vomiting  . Morphine Nausea And Vomiting    Prescriptions prior to admission  Medication Sig Dispense Refill Last Dose  .  ALPRAZolam (XANAX) 0.5 MG tablet Take 1 tablet (0.5 mg total) by mouth at bedtime as needed for anxiety. (Patient not taking: Reported on 04/17/2015) 30 tablet 0 Not Taking at Unknown time  . famotidine (PEPCID AC) 10 MG chewable tablet Chew 10 mg by mouth 2 (two) times daily as needed for heartburn.   prn  . phentermine 30 MG capsule Take 1 capsule (30 mg total) by mouth every morning. (Patient not taking: Reported on 05/09/2015) 30 capsule 0 Not Taking at Unknown time    ROS See HPI above, all other systems are negative  Physical Exam   Blood pressure 122/85, temperature 98.9 F (37.2 C), temperature source Oral, last menstrual period 05/01/2015, unknown if currently breastfeeding.  Physical Exam Ext:  WNL ABD: Soft, non tender to palpation, no rebound or guarding SVE: cervical motion tenderness,    ED Course  Assessment: Cervical motion tenderness  Plan: Labs: wetprep, gc/ct    Azan Maneri, CNM, MSN 05/09/2015. 12:09 PM  MAU Addendum Note  Results for orders placed or performed during the hospital encounter of 05/09/15 (from the past 24 hour(s))  Urinalysis, Routine w reflex microscopic (not at [facility])     Status: Abnormal   Collection Time:  05/09/15 10:50 AM  Result Value Ref Range   Color, Urine YELLOW YELLOW   APPearance CLEAR CLEAR   Specific Gravity, Urine 1.025 1.005 - 1.030   pH 6.0 5.0 - 8.0   Glucose, UA NEGATIVE NEGATIVE mg/dL   Hgb urine dipstick TRACE (A) NEGATIVE   Bilirubin Urine NEGATIVE NEGATIVE   Ketones, ur NEGATIVE NEGATIVE mg/dL   Protein, ur NEGATIVE NEGATIVE mg/dL   Urobilinogen, UA 0.2 0.0 - 1.0 mg/dL   Nitrite NEGATIVE NEGATIVE   Leukocytes, UA NEGATIVE NEGATIVE  Urine microscopic-add on     Status: Abnormal   Collection Time: 05/09/15 10:50 AM  Result Value Ref Range   WBC, UA 0-2 <3 WBC/hpf   RBC / HPF 0-2 <3 RBC/hpf   Bacteria, UA FEW (A) RARE  Pregnancy, urine POC     Status: None   Collection Time: 05/09/15 11:22 AM  Result Value  Ref Range   Preg Test, Ur NEGATIVE NEGATIVE  Wet prep, genital     Status: Abnormal   Collection Time: 05/09/15 12:02 PM  Result Value Ref Range   Yeast Wet Prep HPF POC NONE SEEN NONE SEEN   Trich, Wet Prep NONE SEEN NONE SEEN   Clue Cells Wet Prep HPF POC NONE SEEN NONE SEEN   WBC, Wet Prep HPF POC FEW (A) NONE SEEN   UTI  Plan: -keflex -Discussed need to keep office on thursday -Encouraged to call if any questions or concerns arise prior to next scheduled office visit.  -Discharged to home in stable condition     Nieland, CNM, MSN 05/09/2015. 1:36 PM

## 2015-05-09 NOTE — MAU Note (Signed)
Having some irritation

## 2015-05-11 LAB — GC/CHLAMYDIA PROBE AMP (~~LOC~~) NOT AT ARMC
CHLAMYDIA, DNA PROBE: NEGATIVE
Neisseria Gonorrhea: NEGATIVE

## 2015-05-16 ENCOUNTER — Ambulatory Visit: Payer: BLUE CROSS/BLUE SHIELD | Admitting: Family Medicine

## 2015-05-18 ENCOUNTER — Encounter: Payer: Self-pay | Admitting: Gastroenterology

## 2015-05-26 ENCOUNTER — Encounter (HOSPITAL_BASED_OUTPATIENT_CLINIC_OR_DEPARTMENT_OTHER): Payer: Self-pay | Admitting: Emergency Medicine

## 2015-05-26 ENCOUNTER — Emergency Department (HOSPITAL_BASED_OUTPATIENT_CLINIC_OR_DEPARTMENT_OTHER): Payer: BLUE CROSS/BLUE SHIELD

## 2015-05-26 ENCOUNTER — Emergency Department (HOSPITAL_BASED_OUTPATIENT_CLINIC_OR_DEPARTMENT_OTHER)
Admission: EM | Admit: 2015-05-26 | Discharge: 2015-05-26 | Disposition: A | Discharge status: Home / Self Care | Payer: BLUE CROSS/BLUE SHIELD | Attending: Emergency Medicine | Admitting: Emergency Medicine

## 2015-05-26 DIAGNOSIS — F419 Anxiety disorder, unspecified: Secondary | ICD-10-CM | POA: Diagnosis not present

## 2015-05-26 DIAGNOSIS — Z3202 Encounter for pregnancy test, result negative: Secondary | ICD-10-CM | POA: Diagnosis not present

## 2015-05-26 DIAGNOSIS — Z862 Personal history of diseases of the blood and blood-forming organs and certain disorders involving the immune mechanism: Secondary | ICD-10-CM | POA: Diagnosis not present

## 2015-05-26 DIAGNOSIS — K5909 Other constipation: Secondary | ICD-10-CM | POA: Insufficient documentation

## 2015-05-26 DIAGNOSIS — F329 Major depressive disorder, single episode, unspecified: Secondary | ICD-10-CM | POA: Insufficient documentation

## 2015-05-26 DIAGNOSIS — R1013 Epigastric pain: Secondary | ICD-10-CM | POA: Diagnosis present

## 2015-05-26 DIAGNOSIS — K219 Gastro-esophageal reflux disease without esophagitis: Secondary | ICD-10-CM | POA: Diagnosis not present

## 2015-05-26 DIAGNOSIS — Z86018 Personal history of other benign neoplasm: Secondary | ICD-10-CM | POA: Diagnosis not present

## 2015-05-26 DIAGNOSIS — K5904 Chronic idiopathic constipation: Secondary | ICD-10-CM

## 2015-05-26 LAB — PREGNANCY, URINE: Preg Test, Ur: NEGATIVE

## 2015-05-26 MED ORDER — SUCRALFATE 1 GM/10ML PO SUSP: 1.0000 g | Freq: Three times a day (TID) | ORAL | Status: DC

## 2015-05-26 MED ORDER — ONDANSETRON 8 MG PO TBDP: ORAL_TABLET | ORAL | Status: DC

## 2015-05-26 MED ORDER — ONDANSETRON 8 MG PO TBDP
8.0000 mg | ORAL_TABLET | Freq: Once | ORAL | Status: AC
  Administered 2015-05-26: 8 mg via ORAL
  Filled 2015-05-26: qty 1

## 2015-05-26 MED ORDER — POLYETHYLENE GLYCOL 3350 17 GM/SCOOP PO POWD: 17.0000 g | Freq: Every day | ORAL | Status: DC

## 2015-05-26 MED ORDER — DICYCLOMINE HCL 10 MG/ML IM SOLN
20.0000 mg | Freq: Once | INTRAMUSCULAR | Status: AC
  Administered 2015-05-26: 20 mg via INTRAMUSCULAR
  Filled 2015-05-26: qty 2

## 2015-05-26 MED ORDER — KETOROLAC TROMETHAMINE 60 MG/2ML IM SOLN
60.0000 mg | Freq: Once | INTRAMUSCULAR | Status: AC
  Administered 2015-05-26: 60 mg via INTRAMUSCULAR
  Filled 2015-05-26: qty 2

## 2015-05-26 MED ORDER — GI COCKTAIL ~~LOC~~
30.0000 mL | Freq: Once | ORAL | Status: AC
  Administered 2015-05-26: 30 mL via ORAL
  Filled 2015-05-26: qty 30

## 2015-05-26 MED ORDER — METOCLOPRAMIDE HCL 5 MG/ML IJ SOLN
10.0000 mg | Freq: Once | INTRAMUSCULAR | Status: AC
  Administered 2015-05-26: 10 mg via INTRAMUSCULAR
  Filled 2015-05-26: qty 2

## 2015-05-26 MED ORDER — HYOSCYAMINE SULFATE 0.125 MG SL SUBL: 0.1250 mg | SUBLINGUAL_TABLET | SUBLINGUAL | Status: DC | PRN

## 2015-05-26 NOTE — ED Notes (Addendum)
Pt reports frequent constipation and irregular BM denies diarrhea but admits to frequent nausea no emesis

## 2015-05-26 NOTE — ED Notes (Signed)
Pt reports abd pain since Sunday past  OTC Pepcid and other OTC meds such as tums ineffective. Has appointment with [facility] August 14.

## 2015-05-26 NOTE — ED Provider Notes (Signed)
CSN: [id number]     Arrival date & time 05/26/15  0348 History   First MD Initiated Contact with Patient 05/26/15 0403     Chief Complaint  Patient presents with  . Abdominal Pain     (Consider location/radiation/quality/duration/timing/severity/associated sxs/prior Treatment) Patient is a 44 y.o. female presenting with abdominal pain and GERD. The history is provided by the patient.  Abdominal Pain Pain location:  Epigastric Pain quality: bloating, burning and cramping   Pain radiates to:  Does not radiate Pain severity:  Severe Onset quality:  Gradual Timing:  Constant Progression:  Unchanged Chronicity:  Chronic Context: not laxative use, not recent travel and not trauma   Relieved by:  Nothing Worsened by:  Nothing tried Ineffective treatments:  OTC medications and antacids Associated symptoms: constipation   Associated symptoms: no chest pain, no cough, no fever, no shortness of breath and no vomiting   Risk factors: not pregnant   Gastrophageal Reflux This is a chronic problem. The current episode started more than 1 week ago. The problem occurs constantly. The problem has been gradually worsening. Associated symptoms include abdominal pain. Pertinent negatives include no chest pain, no headaches and no shortness of breath. Nothing aggravates the symptoms. Nothing relieves the symptoms. Treatments tried: all gerd meds. The treatment provided no relief.    Past Medical History  Diagnosis Date  . Esophageal stricture   . Chest pain 09/14/2009    no pulmonary embolus by chest CT  . Palpitations   . Elevated BP   . Anxiety     past Hx  . Depression     past Hx  . GERD (gastroesophageal reflux disease)     no meds  . Sickle cell trait   . Fibroid   . Abnormal Pap smear     cryo, normal since   Past Surgical History  Procedure Laterality Date  . Cesarean section  2004  . Laparoscopy for ectopic pregnancy  2001  . Wisdom tooth extraction  2012   Family History   Problem Relation Age of Onset  . Asthma Mother   . Hypertension Mother   . Hypertension Paternal Uncle   . Asthma Maternal Grandmother    History  Substance Use Topics  . Smoking status: Never Smoker   . Smokeless tobacco: Never Used  . Alcohol Use: No     Comment: social   OB History    Gravida Para Term Preterm AB TAB SAB Ectopic Multiple Living   6 2 2  0 3 0 0 3 0 2     Review of Systems  Constitutional: Negative for fever.  Respiratory: Negative for cough and shortness of breath.   Cardiovascular: Negative for chest pain.  Gastrointestinal: Positive for abdominal pain and constipation. Negative for vomiting.  Neurological: Negative for headaches.  All other systems reviewed and are negative.     Allergies  Diphenhydramine hcl; Hydromorphone hcl; and Morphine  Home Medications   Prior to Admission medications   Medication Sig Start Date End Date Taking? Authorizing Provider  ALPRAZolam Duanne Moron) 0.5 MG tablet Take 1 tablet (0.5 mg total) by mouth at bedtime as needed for anxiety. Patient not taking: Reported on 04/17/2015 07/14/14   Brunetta Jeans, PA-C  famotidine (PEPCID AC) 10 MG chewable tablet Chew 10 mg by mouth 2 (two) times daily as needed for heartburn.    Historical Provider, MD   BP 121/83 mmHg  Pulse 84  Temp(Src) 98.9 F (37.2 C) (Oral)  Resp 20  Ht 5'  6" (1.676 m)  Wt 192 lb (87.091 kg)  BMI 31.00 kg/m2  SpO2 100%  LMP 05/01/2015 (Exact Date) Physical Exam  Constitutional: She is oriented to person, place, and time. She appears well-developed and well-nourished. No distress.  HENT:  Head: Normocephalic and atraumatic.  Mouth/Throat: Oropharynx is clear and moist.  Eyes: Conjunctivae and EOM are normal. Pupils are equal, round, and reactive to light.  Neck: Normal range of motion. Neck supple.  Cardiovascular: Normal rate, regular rhythm and intact distal pulses.   Pulmonary/Chest: Effort normal and breath sounds normal. No respiratory  distress. She has no wheezes. She has no rales.  Abdominal: Soft. Bowel sounds are increased. There is no tenderness. There is no rigidity, no rebound, no guarding, no tenderness at McBurney's point and negative Murphy's sign.  Musculoskeletal: Normal range of motion.  Neurological: She is alert and oriented to person, place, and time.  Skin: Skin is warm and dry.  Psychiatric: She has a normal mood and affect.    ED Course  Procedures (including critical care time) Labs Review Labs Reviewed  PREGNANCY, URINE    Imaging Review Dg Abd Acute W/chest  05/26/2015   CLINICAL DATA:  Initial evaluation for acute abdominal pain for 3 days, nausea up.  EXAM: DG ABDOMEN ACUTE W/ 1V CHEST  COMPARISON:  Prior study from 04/06/2014  FINDINGS: There is no evidence of dilated bowel loops or free intraperitoneal air. Moderate amount of retained stool within the colon. No radiopaque calculi or other significant radiographic abnormality is seen. Heart size and mediastinal contours are within normal limits. Both lungs are clear.  IMPRESSION: Negative abdominal radiographs.  No acute cardiopulmonary disease.   Electronically Signed   By: Jeannine Boga M.D.   On: 05/26/2015 05:08     EKG Interpretation None      MDM   Final diagnoses:  None   Medications  gi cocktail (Maalox,Lidocaine,Donnatal) (30 mLs Oral Given 05/26/15 0428)  dicyclomine (BENTYL) injection 20 mg (20 mg Intramuscular Given 05/26/15 0429)  ketorolac (TORADOL) injection 60 mg (60 mg Intramuscular Given 05/26/15 0429)  ondansetron (ZOFRAN-ODT) disintegrating tablet 8 mg (8 mg Oral Given 05/26/15 0552)  metoCLOPramide (REGLAN) injection 10 mg (10 mg Intramuscular Given 05/26/15 0548)  Gas is markedly improved on exam post medication  This is her typical GERD.  She is only taking pepcid.  It does not appear that the patient has ever been on carafate.  Will prescribe miralax, carafate. levsin and prn zofran.  Call for an appointment  with GI   Chyane Greer, MD 05/26/15 [phone]

## 2015-05-28 ENCOUNTER — Ambulatory Visit: Payer: BLUE CROSS/BLUE SHIELD | Admitting: Family

## 2015-05-28 DIAGNOSIS — Z0289 Encounter for other administrative examinations: Secondary | ICD-10-CM

## 2015-05-29 ENCOUNTER — Telehealth: Payer: Self-pay | Admitting: Family

## 2015-05-29 ENCOUNTER — Encounter: Payer: Self-pay | Admitting: Family

## 2015-05-29 NOTE — Telephone Encounter (Signed)
Pt was no show 05/28/15 9:45am, acute appt, pt has not rescheduled, mailing letter, charge?

## 2015-05-31 NOTE — Telephone Encounter (Signed)
Yes pleae

## 2015-06-05 ENCOUNTER — Inpatient Hospital Stay (HOSPITAL_COMMUNITY)
Admission: AD | Admit: 2015-06-05 | Discharge: 2015-06-05 | Disposition: A | Discharge status: Home / Self Care | Payer: BLUE CROSS/BLUE SHIELD | Source: Ambulatory Visit | Attending: Obstetrics and Gynecology | Admitting: Obstetrics and Gynecology

## 2015-06-05 NOTE — MAU Note (Signed)
Pt stated her OB-GYN office called her and can see her and left.

## 2015-06-10 ENCOUNTER — Other Ambulatory Visit (INDEPENDENT_AMBULATORY_CARE_PROVIDER_SITE_OTHER): Payer: BLUE CROSS/BLUE SHIELD

## 2015-06-10 ENCOUNTER — Encounter: Payer: Self-pay | Admitting: Physician Assistant

## 2015-06-10 ENCOUNTER — Ambulatory Visit (INDEPENDENT_AMBULATORY_CARE_PROVIDER_SITE_OTHER): Payer: BLUE CROSS/BLUE SHIELD | Admitting: Physician Assistant

## 2015-06-10 VITALS — BP 110/72 | HR 72 | Ht 66.0 in | Wt 201.0 lb

## 2015-06-10 DIAGNOSIS — R1013 Epigastric pain: Secondary | ICD-10-CM | POA: Diagnosis not present

## 2015-06-10 DIAGNOSIS — R12 Heartburn: Secondary | ICD-10-CM

## 2015-06-10 DIAGNOSIS — R059 Cough, unspecified: Secondary | ICD-10-CM

## 2015-06-10 DIAGNOSIS — R05 Cough: Secondary | ICD-10-CM

## 2015-06-10 DIAGNOSIS — K219 Gastro-esophageal reflux disease without esophagitis: Secondary | ICD-10-CM

## 2015-06-10 DIAGNOSIS — R1314 Dysphagia, pharyngoesophageal phase: Secondary | ICD-10-CM

## 2015-06-10 LAB — CBC WITH DIFFERENTIAL/PLATELET
Basophils Absolute: 0 10*3/uL (ref 0.0–0.1)
Basophils Relative: 0.3 % (ref 0.0–3.0)
EOS ABS: 0.2 10*3/uL (ref 0.0–0.7)
EOS PCT: 4.2 % (ref 0.0–5.0)
HCT: 36.6 % (ref 36.0–46.0)
HEMOGLOBIN: 12.5 g/dL (ref 12.0–15.0)
LYMPHS PCT: 39.3 % (ref 12.0–46.0)
Lymphs Abs: 2 10*3/uL (ref 0.7–4.0)
MCHC: 34.1 g/dL (ref 30.0–36.0)
MCV: 85 fl (ref 78.0–100.0)
MONO ABS: 0.5 10*3/uL (ref 0.1–1.0)
Monocytes Relative: 9.3 % (ref 3.0–12.0)
Neutro Abs: 2.3 10*3/uL (ref 1.4–7.7)
Neutrophils Relative %: 46.9 % (ref 43.0–77.0)
Platelets: 362 10*3/uL (ref 150.0–400.0)
RBC: 4.3 Mil/uL (ref 3.87–5.11)
RDW: 13 % (ref 11.5–15.5)
WBC: 5 10*3/uL (ref 4.0–10.5)

## 2015-06-10 LAB — COMPREHENSIVE METABOLIC PANEL
ALBUMIN: 3.7 g/dL (ref 3.5–5.2)
ALK PHOS: 49 U/L (ref 39–117)
ALT: 9 U/L (ref 0–35)
AST: 10 U/L (ref 0–37)
BUN: 6 mg/dL (ref 6–23)
CO2: 31 meq/L (ref 19–32)
CREATININE: 0.66 mg/dL (ref 0.40–1.20)
Calcium: 9 mg/dL (ref 8.4–10.5)
Chloride: 105 mEq/L (ref 96–112)
GFR: 126.44 mL/min (ref >60.00)
Glucose, Bld: 81 mg/dL (ref 70–99)
POTASSIUM: 3.9 meq/L (ref 3.5–5.1)
Sodium: 140 mEq/L (ref 135–145)
Total Bilirubin: 0.4 mg/dL (ref 0.2–1.2)
Total Protein: 7.2 g/dL (ref 6.0–8.3)

## 2015-06-10 MED ORDER — PANTOPRAZOLE SODIUM 40 MG PO TBEC: 40.0000 mg | DELAYED_RELEASE_TABLET | Freq: Two times a day (BID) | ORAL | Status: DC

## 2015-06-10 NOTE — Progress Notes (Signed)
Agree with initial assessment and plans as outlined 

## 2015-06-10 NOTE — Progress Notes (Signed)
Patient ID: Stacy [name], female   DOB: 1971/09/13, 44 y.o.   MRN: [id number]   Subjective:    Patient ID: Stacy [name], female    DOB: [birth year], 44 y.o.   MRN: [id number]  HPI Stacy [name] is a 44 year old African-American female new to GI, referred by primary care/Cody Cyndi Lennert for evaluation of persistent heartburn, cough, intermittent solid food dysphagia and postprandial epigastric pain. She also relates that she's had a sense of shortness of breath or air hunger over the past few months as well. She says in generally she just hasn't felt well and doesn't feel "like herself". Patient relates having an endoscopy and colonoscopy done in [place] in 2014 but does not know the practice or the physician's name. She says she was told these exams were normal. She also had an endoscopy with apparent esophageal dilation done by Dr. Benson Norway in 2010. She says she's had chronic problems with acid reflux but has not been on regular medication over the past few years. Now over the past 4 months she says she is having "unbearable" symptoms which are affecting the quality of her life. She says she's having constant heartburn and indigestion during the daytime hours which is also waking her frequently at night. Pepcid over the past couple of weeks with some benefit and also taking Tums and Gas-X. She has also had a cough over the past couple months which she says is a dry hacking cough does not appear to be associated with eating. Sometimes she'll have chest discomfort which radiates into her back and complains of discomfort in her epigastrium which is a crampy type sensation after eating.   Bowel habits tend to be constipated no melena or hematochezia. She has intermittent dysphagia to solid food with a sense of her food "sticking". She is not on any regular aspirin or NSAIDs. No prior abdominal surgery  Review of Systems Pertinent positive and negative review of systems were noted in the  above HPI section.  All other review of systems was otherwise negative.  Outpatient Encounter Prescriptions as of 06/10/2015  Medication Sig  . famotidine (PEPCID AC) 10 MG chewable tablet Chew 10 mg by mouth 2 (two) times daily as needed for heartburn.  . pantoprazole (PROTONIX) 40 MG tablet Take 1 tablet (40 mg total) by mouth 2 (two) times daily.  . [DISCONTINUED] ALPRAZolam (XANAX) 0.5 MG tablet Take 1 tablet (0.5 mg total) by mouth at bedtime as needed for anxiety. (Patient not taking: Reported on 04/17/2015)  . [DISCONTINUED] hyoscyamine (LEVSIN/SL) 0.125 MG SL tablet Place 1 tablet (0.125 mg total) under the tongue every 4 (four) hours as needed.  . [DISCONTINUED] ondansetron (ZOFRAN ODT) 8 MG disintegrating tablet 8mg  ODT q8 hours prn nausea  . [DISCONTINUED] polyethylene glycol powder (GLYCOLAX/MIRALAX) powder Take 17 g by mouth daily.  . [DISCONTINUED] sucralfate (CARAFATE) 1 GM/10ML suspension Take 10 mLs (1 g total) by mouth 4 (four) times daily -  with meals and at bedtime.   No facility-administered encounter medications on file as of 06/10/2015.   Allergies  Allergen Reactions  . Diphenhydramine Hcl Nausea Only  . Hydromorphone Hcl Nausea And Vomiting  . Morphine Nausea And Vomiting   Patient Active Problem List   Diagnosis Date Noted  . Weight gain 04/17/2015  . Environmental allergies 04/17/2015  . Viral pharyngitis 10/01/2014  . Thyromegaly 10/01/2014  . Acute bronchitis 07/14/2014  . Edema 11/07/2013  . Atypical chest pain 04/30/2013  . Mastalgia 01/10/2013  . FATIGUE 05/24/2010  .  VITAMIN D DEFICIENCY 09/30/2009  . PALPITATIONS, RECURRENT 09/30/2009  . CHEST PAIN, ATYPICAL, HX OF 09/30/2009  . ANXIETY STATE, UNSPECIFIED 09/28/2009  . DEPRESSION 09/28/2009  . GERD 09/28/2009  . ELEVATED BLOOD PRESSURE WITHOUT DIAGNOSIS OF HYPERTENSION 09/28/2009   History   Social History  . Marital Status: Married    Spouse Name: N/A  . Number of Children: 2  . Years of  Education: N/A   Occupational History  .  [employer]   Social History Main Topics  . Smoking status: Never Smoker   . Smokeless tobacco: Never Used  . Alcohol Use: No     Comment: social  . Drug Use: No  . Sexual Activity: Not on file   Other Topics Concern  . Not on file   Social History Narrative    [name]-Leisure's family history includes Asthma in her maternal grandmother and mother; Hypertension in her mother and paternal uncle.      Objective:    Filed Vitals:   06/10/15 0825  BP: 110/72  Pulse: 72    Physical Exam  well-developed African American female in no acute distress, pleasant blood pressure 110/72 pulse 72 height 5 foot 6 weight 201. HEENT; nontraumatic normocephalic EOMI PERRLA sclera anicteric, Supple; no JVD, Cardiovascular; regular rate and rhythm with S1-S2 no murmur or gallop, Pulmonary; clear bilaterally, Abdomen; soft basically nontender there is no palpable mass or hepatosplenomegaly bowel sounds are present, Rectal; exam not done, Extremities; no clubbing cyanosis or edema skin warm and dry, Neuropsych ;mood and affect appropriate       Assessment & Plan:   #1 44 yo female  With 4 month hx of daily heartburn/indigestion,  dry cough, intermittent solid food dysphagia, and post prandial epigastric pain- prior hx of GERD, and esophageal dilation #2 mild dyspnea #3 hx anx/depression  Plan; Antireflux regimen Start Protonix 40 mg po BID ac breakfast and ac dinner Schedule for upper abdominal US  Schedule for EGD with possible dilation with Dr. Henrene Pastor - procedure discussed in detail  with pt and she is agreeable to proceed. CBC, CMET today  Pt will sign release so that we can get copies of Dr Ulyses Amor records    Amy Genia Harold PA-C 06/10/2015   Cc: Brunetta Jeans, PA-C

## 2015-06-10 NOTE — Patient Instructions (Addendum)
Please go to the basement level to have your labs drawn.  We sent a prescription to [facility] for Pantoprazole sodium 40 mg.  Take 1 tab before breakfast and dinner. You have been scheduled for an endoscopy. Please follow written instructions given to you at your visit today. If you use inhalers (even only as needed), please bring them with you on the day of your procedure. Your physician has requested that you go to www.startemmi.com and enter the access code given to you at your visit today. This web site gives a general overview about your procedure. However, you should still follow specific instructions given to you by our office regarding your preparation for the procedure.          Normal BMI (Body Mass Index- based on height and weight) is between 19 and 25. Your BMI today is Body mass index is 32.46 kg/(m^2). Marland Kitchen Please consider follow up  regarding your BMI with your Primary Care Provider.

## 2015-06-15 ENCOUNTER — Ambulatory Visit (HOSPITAL_COMMUNITY)
Admission: RE | Admit: 2015-06-15 | Discharge: 2015-06-15 | Disposition: A | Discharge status: Home / Self Care | Payer: BLUE CROSS/BLUE SHIELD | Source: Ambulatory Visit | Attending: Physician Assistant | Admitting: Physician Assistant

## 2015-06-15 DIAGNOSIS — R109 Unspecified abdominal pain: Secondary | ICD-10-CM | POA: Insufficient documentation

## 2015-06-15 DIAGNOSIS — K219 Gastro-esophageal reflux disease without esophagitis: Secondary | ICD-10-CM

## 2015-06-15 DIAGNOSIS — R059 Cough, unspecified: Secondary | ICD-10-CM

## 2015-06-15 DIAGNOSIS — R12 Heartburn: Secondary | ICD-10-CM

## 2015-06-15 DIAGNOSIS — R1013 Epigastric pain: Secondary | ICD-10-CM

## 2015-06-15 DIAGNOSIS — R05 Cough: Secondary | ICD-10-CM

## 2015-06-22 ENCOUNTER — Telehealth: Payer: Self-pay | Admitting: Physician Assistant

## 2015-06-22 NOTE — Telephone Encounter (Signed)
Patient advised of normal study

## 2015-07-14 ENCOUNTER — Encounter: Payer: Self-pay | Admitting: Internal Medicine

## 2015-07-15 ENCOUNTER — Ambulatory Visit: Payer: BLUE CROSS/BLUE SHIELD | Admitting: Physician Assistant

## 2015-07-15 ENCOUNTER — Emergency Department (HOSPITAL_COMMUNITY): Payer: BLUE CROSS/BLUE SHIELD

## 2015-07-15 ENCOUNTER — Telehealth: Payer: Self-pay | Admitting: Physician Assistant

## 2015-07-15 ENCOUNTER — Emergency Department (HOSPITAL_COMMUNITY)
Admission: EM | Admit: 2015-07-15 | Discharge: 2015-07-15 | Disposition: A | Discharge status: Home / Self Care | Payer: BLUE CROSS/BLUE SHIELD | Attending: Emergency Medicine | Admitting: Emergency Medicine

## 2015-07-15 ENCOUNTER — Encounter (HOSPITAL_COMMUNITY): Payer: Self-pay

## 2015-07-15 ENCOUNTER — Telehealth: Payer: Self-pay

## 2015-07-15 DIAGNOSIS — Z8659 Personal history of other mental and behavioral disorders: Secondary | ICD-10-CM | POA: Insufficient documentation

## 2015-07-15 DIAGNOSIS — R51 Headache: Secondary | ICD-10-CM | POA: Insufficient documentation

## 2015-07-15 DIAGNOSIS — Z7982 Long term (current) use of aspirin: Secondary | ICD-10-CM | POA: Diagnosis not present

## 2015-07-15 DIAGNOSIS — R002 Palpitations: Secondary | ICD-10-CM | POA: Insufficient documentation

## 2015-07-15 DIAGNOSIS — R0600 Dyspnea, unspecified: Secondary | ICD-10-CM

## 2015-07-15 DIAGNOSIS — Z862 Personal history of diseases of the blood and blood-forming organs and certain disorders involving the immune mechanism: Secondary | ICD-10-CM | POA: Insufficient documentation

## 2015-07-15 DIAGNOSIS — Z79899 Other long term (current) drug therapy: Secondary | ICD-10-CM | POA: Insufficient documentation

## 2015-07-15 DIAGNOSIS — Z3202 Encounter for pregnancy test, result negative: Secondary | ICD-10-CM | POA: Diagnosis not present

## 2015-07-15 DIAGNOSIS — R519 Headache, unspecified: Secondary | ICD-10-CM

## 2015-07-15 DIAGNOSIS — R7989 Other specified abnormal findings of blood chemistry: Secondary | ICD-10-CM

## 2015-07-15 DIAGNOSIS — K219 Gastro-esophageal reflux disease without esophagitis: Secondary | ICD-10-CM | POA: Diagnosis not present

## 2015-07-15 DIAGNOSIS — R079 Chest pain, unspecified: Secondary | ICD-10-CM | POA: Diagnosis present

## 2015-07-15 DIAGNOSIS — Z86018 Personal history of other benign neoplasm: Secondary | ICD-10-CM | POA: Insufficient documentation

## 2015-07-15 LAB — URINALYSIS, ROUTINE W REFLEX MICROSCOPIC
BILIRUBIN URINE: NEGATIVE
Glucose, UA: NEGATIVE mg/dL
Hgb urine dipstick: NEGATIVE
Ketones, ur: NEGATIVE mg/dL
Leukocytes, UA: NEGATIVE
NITRITE: NEGATIVE
PROTEIN: NEGATIVE mg/dL
SPECIFIC GRAVITY, URINE: 1.003 — AB (ref 1.005–1.030)
UROBILINOGEN UA: 0.2 mg/dL (ref 0.0–1.0)
pH: 7 (ref 5.0–8.0)

## 2015-07-15 LAB — RAPID URINE DRUG SCREEN, HOSP PERFORMED
AMPHETAMINES: NOT DETECTED
Barbiturates: NOT DETECTED
Benzodiazepines: NOT DETECTED
Cocaine: NOT DETECTED
OPIATES: NOT DETECTED
Tetrahydrocannabinol: NOT DETECTED

## 2015-07-15 LAB — BASIC METABOLIC PANEL
ANION GAP: 7 (ref 5–15)
BUN: 11 mg/dL (ref 6–20)
CALCIUM: 9 mg/dL (ref 8.9–10.3)
CO2: 26 mmol/L (ref 22–32)
Chloride: 106 mmol/L (ref 101–111)
Creatinine, Ser: 0.65 mg/dL (ref 0.44–1.00)
GFR calc non Af Amer: >60 mL/min (ref >60)
Glucose, Bld: 89 mg/dL (ref 65–99)
Potassium: 3.6 mmol/L (ref 3.5–5.1)
Sodium: 139 mmol/L (ref 135–145)

## 2015-07-15 LAB — CBC
HCT: 36.6 % (ref 36.0–46.0)
HEMOGLOBIN: 12.6 g/dL (ref 12.0–15.0)
MCH: 28.8 pg (ref 26.0–34.0)
MCHC: 34.4 g/dL (ref 30.0–36.0)
MCV: 83.6 fL (ref 78.0–100.0)
Platelets: 342 10*3/uL (ref 150–400)
RBC: 4.38 MIL/uL (ref 3.87–5.11)
RDW: 13.2 % (ref 11.5–15.5)
WBC: 4.5 10*3/uL (ref 4.0–10.5)

## 2015-07-15 LAB — PREGNANCY, URINE: PREG TEST UR: NEGATIVE

## 2015-07-15 LAB — I-STAT TROPONIN, ED: Troponin i, poc: 0 ng/mL (ref 0.00–0.08)

## 2015-07-15 NOTE — Telephone Encounter (Signed)
Date/time:  07/15/15 @ 8:25:28am Nurse: Leverne Humbles, RN  Chief complaint:  Breathing- shortness of breath or sounds Initial Comment:  Caller states, has accelerated rate off/on, hot flashes, shortness of breath  Reason for call:  "Caller states for the last 2 days she has felt her heart rate is fast off and on.  During the episodes she feels flushed and SOB.  No symptoms now.  She has an appt for today at 3 pm."  Chronic conditions:  GERD  Disposition:  Home Care  Pt has an appointment scheduled today with Elyn Aquas, PA-C at 3:30 pm.  Appt noted updated to reflect complaint.

## 2015-07-15 NOTE — Telephone Encounter (Signed)
Ok- see pt went on to ER. Doubt her sxs are GI in origin

## 2015-07-15 NOTE — Telephone Encounter (Signed)
Spoke with patient who states that she has been feeling hot just on one side (left side).  She has also been experiencing increased heart rate and mild shortness of breath.  Denies chest pain, but does express abdominal pain.  Describes pain as "achy, gassy."  She has a history of GERD and is not sure if whether or not abdominal is coming from that.  She is currently on the phone with nurse at [facility].  Pt was advised to complete call with them and then call us back.

## 2015-07-15 NOTE — Telephone Encounter (Signed)
Spoke with the patient. She states she has contacted her PCP also and is scheduled to see them this afternoon. No earlier appointment here. She states her symptoms are chest discomfort unrelieved by Protonix, Pepcid , Tums and Rolaids. The pain is in her arm and into her back. She has felt SOB all week. States she feels burning on one side, but is without any fever. Encouraged to keep her appointment with PCP and to ER if she acutely worsens.

## 2015-07-15 NOTE — ED Provider Notes (Signed)
CSN: [id number]     Arrival date & time 07/15/15  1105 History   First MD Initiated Contact with Patient 07/15/15 1110     Chief Complaint  Patient presents with  . Chest Pain     (Consider location/radiation/quality/duration/timing/severity/associated sxs/prior Treatment) HPI Patient has been getting episodes of palpitations for the past 3 days. She reports she is also getting hot episodes and feeling like different parts of her body get very flushed. She does not specifically have significant chest pain in association with this. It does make her feel slightly short of breath. She has not had recent fever, cough or mucus production. There has not been lower extremity swelling or calf pain. The patient poor she also is getting headaches which is atypical for her. They will be throbbing and generalized. She denies diarrhea, reports that she tends to get constipation. She reports she does have a history of significant reflux however the symptoms do not fill consistent with a reflux which she has had long-standing. Patient has not started any new medications. She does report the reflux has been worsening over about a 2 week duration. Due to this she has discontinued the use of caffeine and is taking both Protonix and Pepcid. She takes Fish oil regularly and denies use of any other vitamins or supplements. Past Medical History  Diagnosis Date  . Esophageal stricture   . Chest pain 09/14/2009    no pulmonary embolus by chest CT  . Palpitations   . Elevated BP   . Anxiety     past Hx  . Depression     past Hx  . GERD (gastroesophageal reflux disease)     no meds  . Sickle cell trait   . Fibroid   . Abnormal Pap smear     cryo, normal since   Past Surgical History  Procedure Laterality Date  . Cesarean section  2004  . Laparoscopy for ectopic pregnancy  2001  . Wisdom tooth extraction  2012   Family History  Problem Relation Age of Onset  . Asthma Mother   . Hypertension Mother   .  Hypertension Paternal Uncle   . Asthma Maternal Grandmother    Social History  Substance Use Topics  . Smoking status: Never Smoker   . Smokeless tobacco: Never Used  . Alcohol Use: No     Comment: social   OB History    Gravida Para Term Preterm AB TAB SAB Ectopic Multiple Living   6 2 2  0 3 0 0 3 0 2     Review of Systems 10 Systems reviewed and are negative for acute change except as noted in the HPI.    Allergies  Diphenhydramine hcl; Hydromorphone hcl; and Morphine  Home Medications   Prior to Admission medications   Medication Sig Start Date End Date Taking? Authorizing Provider  aspirin 81 MG tablet Take 162 mg by mouth daily as needed for pain.   Yes Historical Provider, MD  famotidine (PEPCID AC) 10 MG chewable tablet Chew 10 mg by mouth 2 (two) times daily as needed for heartburn.   Yes Historical Provider, MD  pantoprazole (PROTONIX) 40 MG tablet Take 1 tablet (40 mg total) by mouth 2 (two) times daily. 06/10/15  Yes Amy S Esterwood, PA-C   BP 102/65 mmHg  Pulse 72  Temp(Src) 98.8 F (37.1 C) (Oral)  Resp 18  Ht 5\' 6"  (1.676 m)  Wt 198 lb (89.812 kg)  BMI 31.97 kg/m2  SpO2 100%  LMP  06/23/2015 Physical Exam  Constitutional: She is oriented to person, place, and time. She appears well-developed and well-nourished.  HENT:  Head: Normocephalic and atraumatic.  Eyes: EOM are normal. Pupils are equal, round, and reactive to light.  Neck: Neck supple.  Cardiovascular: Normal rate, regular rhythm, normal heart sounds and intact distal pulses.   Pulmonary/Chest: Effort normal and breath sounds normal.  Abdominal: Soft. Bowel sounds are normal. She exhibits no distension. There is no tenderness.  Musculoskeletal: Normal range of motion. She exhibits no edema.  Neurological: She is alert and oriented to person, place, and time. She has normal strength. Coordination normal. GCS eye subscore is 4. GCS verbal subscore is 5. GCS motor subscore is 6.  Skin: Skin is  warm, dry and intact.  Psychiatric: She has a normal mood and affect.    ED Course  Procedures (including critical care time) Labs Review Labs Reviewed  URINALYSIS, ROUTINE W REFLEX MICROSCOPIC (NOT AT [facility]) - Abnormal; Notable for the following:    APPearance CLOUDY (*)    Specific Gravity, Urine 1.003 (*)    All other components within normal limits  BASIC METABOLIC PANEL  CBC  PREGNANCY, URINE  URINE RAPID DRUG SCREEN, HOSP PERFORMED  I-STAT TROPOININ, ED    Imaging Review Dg Chest 2 View  07/15/2015   CLINICAL DATA:  Heart palpitations, fever and shortness of breath since 07/15/2015.  EXAM: CHEST  2 VIEW  COMPARISON:  Single view of the chest 05/26/2015. PA and lateral chest 04/06/2014.  FINDINGS: The lungs are clear. Heart size is normal. No pneumothorax or pleural effusion. No focal bony abnormality.  IMPRESSION: Negative chest.   Electronically Signed   By: Inge Rise M.D.   On: 07/15/2015 12:00   I have personally reviewed and evaluated these images and lab results as part of my medical decision-making.   EKG Interpretation   Date/Time:  Wednesday July 15 2015 11:16:20 EDT Ventricular Rate:  82 PR Interval:  141 QRS Duration: 104 QT Interval:  364 QTC Calculation: 425 R Axis:   40 Text Interpretation:  Sinus arrhythmia Probable anteroseptal infarct, old  no STEMI. isolated  inferior  t wave inversion. Confirmed by Johnney Killian, MD,  Jeannie Done [phone]) on 07/15/2015 11:37:46 AM      MDM   Final diagnoses:  Palpitation  Acute nonintractable headache, unspecified headache type   At this time diagnostic workup is negative. Patient's vital signs are stable. There is no evidence of dysrhythmia. There is not significant ectopy present on rhythm or EKG. Patient's general condition is good. She is advised on follow-up for further monitoring and diagnostic evaluation if needed.    Charlesetta Shanks, MD 07/16/15 [phone]

## 2015-07-15 NOTE — ED Notes (Signed)
Pt states on Monday felt heart palpitations, feeling feverish, shortness of breath.  Having different episodes since Monday of same condition.  Pt called DR today and had appt at 3pm.  She called them back and was told to come here.  No cough/congestion. Does have hx of reflux but states this feels different.

## 2015-07-15 NOTE — Discharge Instructions (Signed)
Palpitations A palpitation is the feeling that your heartbeat is irregular or is faster than normal. It may feel like your heart is fluttering or skipping a beat. Palpitations are usually not a serious problem. However, in some cases, you may need further medical evaluation. CAUSES  Palpitations can be caused by:  Smoking.  Caffeine or other stimulants, such as diet pills or energy drinks.  Alcohol.  Stress and anxiety.  Strenuous physical activity.  Fatigue.  Certain medicines.  Heart disease, especially if you have a history of irregular heart rhythms (arrhythmias), such as atrial fibrillation, atrial flutter, or supraventricular tachycardia.  An improperly working pacemaker or defibrillator. DIAGNOSIS  To find the cause of your palpitations, your health care provider will take your medical history and perform a physical exam. Your health care provider may also have you take a test called an ambulatory electrocardiogram (ECG). An ECG records your heartbeat patterns over a 24-hour period. You may also have other tests, such as:  Transthoracic echocardiogram (TTE). During echocardiography, sound waves are used to evaluate how blood flows through your heart.  Transesophageal echocardiogram (TEE).  Cardiac monitoring. This allows your health care provider to monitor your heart rate and rhythm in real time.  Holter monitor. This is a portable device that records your heartbeat and can help diagnose heart arrhythmias. It allows your health care provider to track your heart activity for several days, if needed.  Stress tests by exercise or by giving medicine that makes the heart beat faster. TREATMENT  Treatment of palpitations depends on the cause of your symptoms and can vary greatly. Most cases of palpitations do not require any treatment other than time, relaxation, and monitoring your symptoms. Other causes, such as atrial fibrillation, atrial flutter, or supraventricular  tachycardia, usually require further treatment. HOME CARE INSTRUCTIONS   Avoid:  Caffeinated coffee, tea, soft drinks, diet pills, and energy drinks.  Chocolate.  Alcohol.  Stop smoking if you smoke.  Reduce your stress and anxiety. Things that can help you relax include:  A method of controlling things in your body, such as your heartbeats, with your mind (biofeedback).  Yoga.  Meditation.  Physical activity such as swimming, jogging, or walking.  Get plenty of rest and sleep. SEEK MEDICAL CARE IF:   You continue to have a fast or irregular heartbeat beyond 24 hours.  Your palpitations occur more often. SEEK IMMEDIATE MEDICAL CARE IF:  You have chest pain or shortness of breath.  You have a severe headache.  You feel dizzy or you faint. MAKE SURE YOU:  Understand these instructions.  Will watch your condition.  Will get help right away if you are not doing well or get worse. Document Released: 11/11/2000 Document Revised: 11/19/2013 Document Reviewed: 01/13/2012 [facility] Patient Information 2015 [place]. This information is not intended to replace advice given to you by your health care provider. Make sure you discuss any questions you have with your health care provider. Chest Pain (Nonspecific) It is often hard to give a specific diagnosis for the cause of chest pain. There is always a chance that your pain could be related to something serious, such as a heart attack or a blood clot in the lungs. You need to follow up with your health care provider for further evaluation. CAUSES   Heartburn.  Pneumonia or bronchitis.  Anxiety or stress.  Inflammation around your heart (pericarditis) or lung (pleuritis or pleurisy).  A blood clot in the lung.  A collapsed lung (pneumothorax). It can  develop suddenly on its own (spontaneous pneumothorax) or from trauma to the chest.  Shingles infection (herpes zoster virus). The chest wall is composed of bones,  muscles, and cartilage. Any of these can be the source of the pain.  The bones can be bruised by injury.  The muscles or cartilage can be strained by coughing or overwork.  The cartilage can be affected by inflammation and become sore (costochondritis). DIAGNOSIS  Lab tests or other studies may be needed to find the cause of your pain. Your health care provider may have you take a test called an ambulatory electrocardiogram (ECG). An ECG records your heartbeat patterns over a 24-hour period. You may also have other tests, such as:  Transthoracic echocardiogram (TTE). During echocardiography, sound waves are used to evaluate how blood flows through your heart.  Transesophageal echocardiogram (TEE).  Cardiac monitoring. This allows your health care provider to monitor your heart rate and rhythm in real time.  Holter monitor. This is a portable device that records your heartbeat and can help diagnose heart arrhythmias. It allows your health care provider to track your heart activity for several days, if needed.  Stress tests by exercise or by giving medicine that makes the heart beat faster. TREATMENT   Treatment depends on what may be causing your chest pain. Treatment may include:  Acid blockers for heartburn.  Anti-inflammatory medicine.  Pain medicine for inflammatory conditions.  Antibiotics if an infection is present.  You may be advised to change lifestyle habits. This includes stopping smoking and avoiding alcohol, caffeine, and chocolate.  You may be advised to keep your head raised (elevated) when sleeping. This reduces the chance of acid going backward from your stomach into your esophagus. Most of the time, nonspecific chest pain will improve within 2-3 days with rest and mild pain medicine.  HOME CARE INSTRUCTIONS   If antibiotics were prescribed, take them as directed. Finish them even if you start to feel better.  For the next few days, avoid physical activities  that bring on chest pain. Continue physical activities as directed.  Do not use any tobacco products, including cigarettes, chewing tobacco, or electronic cigarettes.  Avoid drinking alcohol.  Only take medicine as directed by your health care provider.  Follow your health care provider's suggestions for further testing if your chest pain does not go away.  Keep any follow-up appointments you made. If you do not go to an appointment, you could develop lasting (chronic) problems with pain. If there is any problem keeping an appointment, call to reschedule. SEEK MEDICAL CARE IF:   Your chest pain does not go away, even after treatment.  You have a rash with blisters on your chest.  You have a fever. SEEK IMMEDIATE MEDICAL CARE IF:   You have increased chest pain or pain that spreads to your arm, neck, jaw, back, or abdomen.  You have shortness of breath.  You have an increasing cough, or you cough up blood.  You have severe back or abdominal pain.  You feel nauseous or vomit.  You have severe weakness.  You faint.  You have chills. This is an emergency. Do not wait to see if the pain will go away. Get medical help at once. Call your local emergency services (911 in [place] Do not drive yourself to the hospital. MAKE SURE YOU:   Understand these instructions.  Will watch your condition.  Will get help right away if you are not doing well or get worse. Document Released:  08/24/2005 Document Revised: 11/19/2013 Document Reviewed: 06/19/2008 [facility] Patient Information 2015 [place], [place]. This information is not intended to replace advice given to you by your health care provider. Make sure you discuss any questions you have with your health care provider.

## 2015-07-16 NOTE — Telephone Encounter (Signed)
No charge. 

## 2015-07-16 NOTE — Telephone Encounter (Signed)
Pt was no show 07/15/15 3:30pm, 07/16/15 12:52pm pt called stating she went to ER yesterday & told it is dehydration and stress, Scheduled ER follow up with Melissa 07/17/15 8:15am.  Mel Almond on ER follow up [facility] - charge for no show (assuming not if in ER)?

## 2015-07-17 ENCOUNTER — Encounter: Payer: Self-pay | Admitting: Family

## 2015-07-17 ENCOUNTER — Ambulatory Visit (INDEPENDENT_AMBULATORY_CARE_PROVIDER_SITE_OTHER): Payer: BLUE CROSS/BLUE SHIELD | Admitting: Family

## 2015-07-17 VITALS — BP 122/80 | HR 90 | Temp 97.6°F | Resp 16 | Ht 65.0 in | Wt 200.0 lb

## 2015-07-17 DIAGNOSIS — F411 Generalized anxiety disorder: Secondary | ICD-10-CM

## 2015-07-17 MED ORDER — ESCITALOPRAM OXALATE 10 MG PO TABS: ORAL_TABLET | ORAL | Status: DC

## 2015-07-17 MED ORDER — CLONAZEPAM 0.5 MG PO TABS: 0.5000 mg | ORAL_TABLET | Freq: Two times a day (BID) | ORAL | Status: DC | PRN

## 2015-07-17 NOTE — Patient Instructions (Signed)
Start lexapro 10mg  1/2 tab by mouth daily for 1 week, then increase to a full tab once daily on week two.

## 2015-07-17 NOTE — Progress Notes (Signed)
Subjective:    Patient ID: Stacy [name], female    DOB: 17-Jan-1971, 44 y.o.   MRN: [id number]  HPI  Stacy [name] presents today for ER follow up.  ER record is reviewed.  She presented to the ED on 8/17 with palpitations which has been present x 3 days and associated with flushing and hot feelings on different parts of her body. EKG was unremarkable.  UA, tryponin, electrolytes, CBC, all unremarkable. Had normal TSH in the end of March.  She reports she flew to [place] [employer] for work on friday. Reports that she developed a hot feeling. [facility]  Monday, similar symptoms occurred while at [facility].  Sat down for a while.   Wednesday while eating she began to feel hot, "like I couldn't breath." Felt dizzy.  Went to the ER.  She denies chest pain.  She reports work is extremely stressful. 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  Reports that mood is stable.    Review of Systems See HPI  Past Medical History  Diagnosis Date  . Esophageal stricture   . Chest pain 09/14/2009    no pulmonary embolus by chest CT  . Palpitations   . Elevated BP   . Anxiety     past Hx  . Depression     past Hx  . GERD (gastroesophageal reflux disease)     no meds  . Sickle cell trait   . Fibroid   . Abnormal Pap smear     cryo, normal since    Social History   Social History  . Marital Status: Married    Spouse Name: N/A  . Number of Children: 2  . Years of Education: N/A   Occupational History  .  [employer]   Social History Main Topics  . Smoking status: Never Smoker   . Smokeless tobacco: Never Used  . Alcohol Use: No     Comment: social  . Drug Use: No  . Sexual Activity: Not on file   Other Topics Concern  . Not on file   Social History Narrative    Past Surgical History  Procedure Laterality Date  . Cesarean section  2004  . Laparoscopy for ectopic pregnancy  2001  . Wisdom tooth  extraction  2012    Family History  Problem Relation Age of Onset  . Asthma Mother   . Hypertension Mother   . Hypertension Paternal Uncle   . Asthma Maternal Grandmother     Allergies  Allergen Reactions  . Diphenhydramine Hcl Nausea Only  . Hydromorphone Hcl Nausea And Vomiting  . Morphine Nausea And Vomiting    Current Outpatient Prescriptions on File Prior to Visit  Medication Sig Dispense Refill  . aspirin 81 MG tablet Take 162 mg by mouth daily as needed for pain.    . famotidine (PEPCID AC) 10 MG chewable tablet Chew 10 mg by mouth 2 (two) times daily as needed for heartburn.    . pantoprazole (PROTONIX) 40 MG tablet Take 1 tablet (40 mg total) by mouth 2 (two) times daily. 60 tablet 3   No current facility-administered medications on file prior to visit.    BP 122/80 mmHg  Pulse 90  Temp(Src) 97.6 F (36.4 C) (Oral)  Resp 16  Ht 5\' 5"  (1.651 m)  Wt 200 lb (90.719 kg)  BMI 33.28 kg/m2  SpO2 100%  LMP 06/23/2015  Objective:   Physical Exam  Constitutional: She is oriented to person, place, and time. She appears well-developed and well-nourished.  HENT:  Head: Normocephalic and atraumatic.  Cardiovascular: Normal rate, regular rhythm and normal heart sounds.   No murmur heard. Pulmonary/Chest: Effort normal and breath sounds normal. No respiratory distress. She has no wheezes.  Musculoskeletal: She exhibits no edema.  Neurological: She is alert and oriented to person, place, and time.  Skin: Skin is warm and dry.  Psychiatric: She has a normal mood and affect. Her behavior is normal. Judgment and thought content normal.          Assessment & Plan:

## 2015-07-17 NOTE — Assessment & Plan Note (Addendum)
Symptoms most consistent with anxiety/panic attacks. Will start lexapro 10mg .  I instructed pt to start 1/2 tablet once daily for 1 week and then increase to a full tablet once daily on week two as tolerated.  We discussed common side effects such as nausea, drowsiness and weight gain.  Also discussed rare but serious side effect of suicide ideation.  She is instructed to discontinue medication go directly to ED if this occurs.  Pt verbalizes understanding. 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 A controlled substance contract is signed. Plan follow up in 1 month to evaluate progress.

## 2015-07-17 NOTE — Progress Notes (Signed)
Pre visit review using our clinic review tool, if applicable. No additional management support is needed unless otherwise documented below in the visit note. 

## 2015-07-21 ENCOUNTER — Telehealth: Payer: Self-pay | Admitting: Family

## 2015-07-21 NOTE — Telephone Encounter (Signed)
Letter printed. Left message for pt to return my call. Need to know if pt will pick up note or if she needs it faxed, what is the fax#?

## 2015-07-21 NOTE — Telephone Encounter (Signed)
°  Relation to ZT:AEWY Call back number:[phone] Pharmacy:  Reason for call: pt was seen by [facility] on Friday, pt states that she is still not feeling well, and unable to go to work today because she has to travel to [place] so wanted to know if she can get a note for her job stating she was out.

## 2015-07-21 NOTE — Telephone Encounter (Signed)
Ok to write note

## 2015-07-22 ENCOUNTER — Ambulatory Visit: Payer: BLUE CROSS/BLUE SHIELD | Admitting: Gastroenterology

## 2015-07-22 NOTE — Telephone Encounter (Signed)
Notified pt and she states she will pick up letter. Leter placed at front desk.

## 2015-07-28 ENCOUNTER — Ambulatory Visit (AMBULATORY_SURGERY_CENTER): Payer: BLUE CROSS/BLUE SHIELD | Admitting: Internal Medicine

## 2015-07-28 ENCOUNTER — Telehealth: Payer: Self-pay | Admitting: Internal Medicine

## 2015-07-28 ENCOUNTER — Encounter: Payer: Self-pay | Admitting: Internal Medicine

## 2015-07-28 VITALS — BP 126/77 | HR 69 | Temp 98.4°F | Resp 19 | Ht 66.0 in | Wt 201.0 lb

## 2015-07-28 DIAGNOSIS — K219 Gastro-esophageal reflux disease without esophagitis: Secondary | ICD-10-CM

## 2015-07-28 DIAGNOSIS — R1013 Epigastric pain: Secondary | ICD-10-CM | POA: Diagnosis not present

## 2015-07-28 MED ORDER — SODIUM CHLORIDE 0.9 % IV SOLN: 500.0000 mL | INTRAVENOUS | Status: DC

## 2015-07-28 MED ORDER — ONDANSETRON HCL 4 MG/5ML PO SOLN
4.0000 mg | Freq: Once | ORAL | Status: AC
  Administered 2015-07-28: 4 mg via ORAL

## 2015-07-28 NOTE — Patient Instructions (Signed)
YOU HAD AN ENDOSCOPIC PROCEDURE TODAY AT [facility]:   Refer to the procedure report that was given to you for any specific questions about what was found during the examination.  If the procedure report does not answer your questions, please call your gastroenterologist to clarify.  If you requested that your care partner not be given the details of your procedure findings, then the procedure report has been included in a sealed envelope for you to review at your convenience later.  YOU SHOULD EXPECT: Some feelings of bloating in the abdomen. Passage of more gas than usual.  Walking can help get rid of the air that was put into your GI tract during the procedure and reduce the bloating.   Please Note:  You might notice some irritation and congestion in your nose or some drainage.  This is from the oxygen used during your procedure.  There is no need for concern and it should clear up in a day or so.  SYMPTOMS TO REPORT IMMEDIATELY:    Following upper endoscopy (EGD)  Vomiting of blood or coffee ground material  New chest pain or pain under the shoulder blades  Painful or persistently difficult swallowing  New shortness of breath  Fever of 100F or higher  Black, tarry-looking stools  For urgent or emergent issues, a gastroenterologist can be reached at any hour by calling [phone].   DIET: Your first meal following the procedure should be a small meal and then it is ok to progress to your normal diet. Heavy or fried foods are harder to digest and may make you feel nauseous or bloated.  Likewise, meals heavy in dairy and vegetables can increase bloating.  Drink plenty of fluids but you should avoid alcoholic beverages for 24 hours.  ACTIVITY:  You should plan to take it easy for the rest of today and you should NOT DRIVE or use heavy machinery until tomorrow (because of the sedation medicines used during the test).    FOLLOW UP: Our staff will call the number listed  on your records the next business day following your procedure to check on you and address any questions or concerns that you may have regarding the information given to you following your procedure. If we do not reach you, we will leave a message.  However, if you are feeling well and you are not experiencing any problems, there is no need to return our call.  We will assume that you have returned to your regular daily activities without incident.  If any biopsies were taken you will be contacted by phone or by letter within the next 1-3 weeks.  Please call us at [phone] if you have not heard about the biopsies in 3 weeks.    SIGNATURES/CONFIDENTIALITY: You and/or your care partner have signed paperwork which will be entered into your electronic medical record.  These signatures attest to the fact that that the information above on your After Visit Summary has been reviewed and is understood.  Full responsibility of the confidentiality of this discharge information lies with you and/or your care-partner.  Thank-you for choosing [place] for your healthcare needs.

## 2015-07-28 NOTE — Telephone Encounter (Signed)
Pt states that her right shoulder and are are sore and wonders if this is a normal side effect. Discussed with pt that this is not a usual complaint. Pt will monitor for now and take some OTC med for the discomfort.

## 2015-07-28 NOTE — Progress Notes (Signed)
Report to PACU, RN, vss, BBS= Clear.  

## 2015-07-28 NOTE — Op Note (Signed)
[place]  [address] [place], [address]   ENDOSCOPY PROCEDURE REPORT  PATIENT: Stacy [name], Stacy [name]  MR#: [id number] BIRTHDATE: January 24, 1971 , 43  yrs. old GENDER: female ENDOSCOPIST: Eustace Quail, MD REFERRED BY:  Brunetta Jeans, PAC. PROCEDURE DATE:  07/28/2015 PROCEDURE:  EGD, diagnostic ASA CLASS:     Class II INDICATIONS:  epigastric pain, burning, choking,SOB. NO true dysphagia upon questioning today. MEDICATIONS: Monitored anesthesia care and Propofol 170 mg IV TOPICAL ANESTHETIC: none  DESCRIPTION OF PROCEDURE: After the risks benefits and alternatives of the procedure were thoroughly explained, informed consent was obtained.  The LB [id number] endoscope was introduced through the mouth and advanced to the second portion of the duodenum , Without limitations.  The instrument was slowly withdrawn as the mucosa was fully examined.   EXAM: The esophagus and gastroesophageal junction were completely normal in appearance.  The stomach was entered and closely examined.The antrum, angularis, and lesser curvature were well visualized, including a retroflexed view of the cardia and fundus. The stomach wall was normally distensable.  The scope passed easily through the pylorus into the duodenum.  Retroflexed views revealed no abnormalities.     The scope was then withdrawn from the patient and the procedure completed.  COMPLICATIONS: There were no immediate complications.  ENDOSCOPIC IMPRESSION: 1. Normal EGD 2. No GI cause for symptoms found or suspected  RECOMMENDATIONS: 1.  Anti-reflux regimen to be followed 2.  Continue current medications 3.  Return to the Care of your primary provider  for further evaluation  REPEAT EXAM:  eSigned:  Eustace Quail, MD 07/28/2015 10:09 AM    CC:The Patient and Leeanne Rio, [facility]

## 2015-07-29 ENCOUNTER — Telehealth: Payer: Self-pay | Admitting: *Deleted

## 2015-07-29 NOTE — Telephone Encounter (Signed)
  Follow up Call-  Call back number 07/28/2015  Post procedure Call Back phone  # [phone]  Permission to leave phone message Yes     Patient questions:  Do you have a fever, pain , or abdominal swelling? No. Pain Score  0 *  Have you tolerated food without any problems? Yes.    Have you been able to return to your normal activities? Yes.    Do you have any questions about your discharge instructions: Diet   No. Medications  No. Follow up visit  No.  Do you have questions or concerns about your Care? No.  Actions: * If pain score is 4 or above: No action needed, pain <4.

## 2015-08-07 ENCOUNTER — Encounter (HOSPITAL_BASED_OUTPATIENT_CLINIC_OR_DEPARTMENT_OTHER): Payer: Self-pay | Admitting: *Deleted

## 2015-08-07 ENCOUNTER — Ambulatory Visit: Payer: BLUE CROSS/BLUE SHIELD | Admitting: Internal Medicine

## 2015-08-07 ENCOUNTER — Emergency Department (HOSPITAL_BASED_OUTPATIENT_CLINIC_OR_DEPARTMENT_OTHER): Payer: BLUE CROSS/BLUE SHIELD

## 2015-08-07 ENCOUNTER — Ambulatory Visit: Payer: BLUE CROSS/BLUE SHIELD | Admitting: Medical

## 2015-08-07 ENCOUNTER — Emergency Department (HOSPITAL_BASED_OUTPATIENT_CLINIC_OR_DEPARTMENT_OTHER)
Admission: EM | Admit: 2015-08-07 | Discharge: 2015-08-07 | Disposition: A | Discharge status: Home / Self Care | Payer: BLUE CROSS/BLUE SHIELD | Attending: Emergency Medicine | Admitting: Emergency Medicine

## 2015-08-07 ENCOUNTER — Telehealth: Payer: Self-pay | Admitting: Family

## 2015-08-07 DIAGNOSIS — Z86018 Personal history of other benign neoplasm: Secondary | ICD-10-CM | POA: Insufficient documentation

## 2015-08-07 DIAGNOSIS — F419 Anxiety disorder, unspecified: Secondary | ICD-10-CM | POA: Insufficient documentation

## 2015-08-07 DIAGNOSIS — Z862 Personal history of diseases of the blood and blood-forming organs and certain disorders involving the immune mechanism: Secondary | ICD-10-CM | POA: Diagnosis not present

## 2015-08-07 DIAGNOSIS — Z79899 Other long term (current) drug therapy: Secondary | ICD-10-CM | POA: Insufficient documentation

## 2015-08-07 DIAGNOSIS — R0602 Shortness of breath: Secondary | ICD-10-CM

## 2015-08-07 DIAGNOSIS — Z3202 Encounter for pregnancy test, result negative: Secondary | ICD-10-CM | POA: Insufficient documentation

## 2015-08-07 DIAGNOSIS — R079 Chest pain, unspecified: Secondary | ICD-10-CM

## 2015-08-07 DIAGNOSIS — F329 Major depressive disorder, single episode, unspecified: Secondary | ICD-10-CM | POA: Diagnosis not present

## 2015-08-07 DIAGNOSIS — Z7982 Long term (current) use of aspirin: Secondary | ICD-10-CM | POA: Diagnosis not present

## 2015-08-07 DIAGNOSIS — K219 Gastro-esophageal reflux disease without esophagitis: Secondary | ICD-10-CM | POA: Diagnosis not present

## 2015-08-07 LAB — CBC WITH DIFFERENTIAL/PLATELET
BASOS ABS: 0 10*3/uL (ref 0.0–0.1)
BASOS PCT: 0 % (ref 0–1)
EOS ABS: 0.3 10*3/uL (ref 0.0–0.7)
EOS PCT: 5 % (ref 0–5)
HCT: 34.8 % — ABNORMAL LOW (ref 36.0–46.0)
Hemoglobin: 12 g/dL (ref 12.0–15.0)
LYMPHS PCT: 35 % (ref 12–46)
Lymphs Abs: 1.8 10*3/uL (ref 0.7–4.0)
MCH: 28.6 pg (ref 26.0–34.0)
MCHC: 34.5 g/dL (ref 30.0–36.0)
MCV: 83.1 fL (ref 78.0–100.0)
MONO ABS: 0.6 10*3/uL (ref 0.1–1.0)
Monocytes Relative: 12 % (ref 3–12)
Neutro Abs: 2.6 10*3/uL (ref 1.7–7.7)
Neutrophils Relative %: 48 % (ref 43–77)
PLATELETS: 353 10*3/uL (ref 150–400)
RBC: 4.19 MIL/uL (ref 3.87–5.11)
RDW: 12.6 % (ref 11.5–15.5)
WBC: 5.3 10*3/uL (ref 4.0–10.5)

## 2015-08-07 LAB — BASIC METABOLIC PANEL
ANION GAP: 7 (ref 5–15)
BUN: 10 mg/dL (ref 6–20)
CALCIUM: 8.8 mg/dL — AB (ref 8.9–10.3)
CO2: 27 mmol/L (ref 22–32)
Chloride: 105 mmol/L (ref 101–111)
Creatinine, Ser: 0.62 mg/dL (ref 0.44–1.00)
GFR calc Af Amer: >60 mL/min (ref >60)
GLUCOSE: 89 mg/dL (ref 65–99)
Potassium: 3.9 mmol/L (ref 3.5–5.1)
SODIUM: 139 mmol/L (ref 135–145)

## 2015-08-07 LAB — URINALYSIS, ROUTINE W REFLEX MICROSCOPIC
BILIRUBIN URINE: NEGATIVE
Glucose, UA: NEGATIVE mg/dL
HGB URINE DIPSTICK: NEGATIVE
Ketones, ur: NEGATIVE mg/dL
Leukocytes, UA: NEGATIVE
Nitrite: NEGATIVE
PH: 7 (ref 5.0–8.0)
Protein, ur: NEGATIVE mg/dL
SPECIFIC GRAVITY, URINE: 1.012 (ref 1.005–1.030)
UROBILINOGEN UA: 1 mg/dL (ref 0.0–1.0)

## 2015-08-07 LAB — PREGNANCY, URINE: Preg Test, Ur: NEGATIVE

## 2015-08-07 LAB — TROPONIN I: Troponin I: <0.03 ng/mL (ref <0.031)

## 2015-08-07 LAB — BRAIN NATRIURETIC PEPTIDE: B Natriuretic Peptide: 27.2 pg/mL (ref 0.0–100.0)

## 2015-08-07 MED ORDER — ACETAMINOPHEN 500 MG PO TABS
1000.0000 mg | ORAL_TABLET | Freq: Once | ORAL | Status: AC
  Administered 2015-08-07: 1000 mg via ORAL
  Filled 2015-08-07: qty 2

## 2015-08-07 NOTE — ED Notes (Signed)
Ambulated in hallway on room air, HR 79-85, RR 14-18, SpO2 100% for the duration.

## 2015-08-07 NOTE — ED Notes (Signed)
Pt reports sinus pain and congestion, and bilateral shoulder aching pain that radiates down her left arm at times, since having an endoscopy last Tuesday.

## 2015-08-07 NOTE — Discharge Instructions (Signed)
Chest Wall Pain Chest wall pain is pain in or around the bones and muscles of your chest. It may take up to 6 weeks to get better. It may take longer if you must stay physically active in your work and activities.  CAUSES  Chest wall pain may happen on its own. However, it may be caused by:  A viral illness like the flu.  Injury.  Coughing.  Exercise.  Arthritis.  Fibromyalgia.  Shingles. HOME CARE INSTRUCTIONS   Avoid overtiring physical activity. Try not to strain or perform activities that cause pain. This includes any activities using your chest or your abdominal and side muscles, especially if heavy weights are used.  Put ice on the sore area.  Put ice in a plastic bag.  Place a towel between your skin and the bag.  Leave the ice on for 15-20 minutes per hour while awake for the first 2 days.  Only take over-the-counter or prescription medicines for pain, discomfort, or fever as directed by your caregiver. SEEK IMMEDIATE MEDICAL CARE IF:   Your pain increases, or you are very uncomfortable.  You have a fever.  Your chest pain becomes worse.  You have new, unexplained symptoms.  You have nausea or vomiting.  You feel sweaty or lightheaded.  You have a cough with phlegm (sputum), or you cough up blood. MAKE SURE YOU:   Understand these instructions.  Will watch your condition.  Will get help right away if you are not doing well or get worse. Document Released: 11/14/2005 Document Revised: 02/06/2012 Document Reviewed: 07/11/2011 [facility] Patient Information 2015 [place], [place]. This information is not intended to replace advice given to you by your health care provider. Make sure you discuss any questions you have with your health care provider.  Chest Pain (Nonspecific) It is often hard to give a specific diagnosis for the cause of chest pain. There is always a chance that your pain could be related to something serious, such as a heart attack or a blood  clot in the lungs. You need to follow up with your health care provider for further evaluation. CAUSES   Heartburn.  Pneumonia or bronchitis.  Anxiety or stress.  Inflammation around your heart (pericarditis) or lung (pleuritis or pleurisy).  A blood clot in the lung.  A collapsed lung (pneumothorax). It can develop suddenly on its own (spontaneous pneumothorax) or from trauma to the chest.  Shingles infection (herpes zoster virus). The chest wall is composed of bones, muscles, and cartilage. Any of these can be the source of the pain.  The bones can be bruised by injury.  The muscles or cartilage can be strained by coughing or overwork.  The cartilage can be affected by inflammation and become sore (costochondritis). DIAGNOSIS  Lab tests or other studies may be needed to find the cause of your pain. Your health care provider may have you take a test called an ambulatory electrocardiogram (ECG). An ECG records your heartbeat patterns over a 24-hour period. You may also have other tests, such as:  Transthoracic echocardiogram (TTE). During echocardiography, sound waves are used to evaluate how blood flows through your heart.  Transesophageal echocardiogram (TEE).  Cardiac monitoring. This allows your health care provider to monitor your heart rate and rhythm in real time.  Holter monitor. This is a portable device that records your heartbeat and can help diagnose heart arrhythmias. It allows your health care provider to track your heart activity for several days, if needed.  Stress tests by  exercise or by giving medicine that makes the heart beat faster. TREATMENT   Treatment depends on what may be causing your chest pain. Treatment may include:  Acid blockers for heartburn.  Anti-inflammatory medicine.  Pain medicine for inflammatory conditions.  Antibiotics if an infection is present.  You may be advised to change lifestyle habits. This includes stopping smoking and  avoiding alcohol, caffeine, and chocolate.  You may be advised to keep your head raised (elevated) when sleeping. This reduces the chance of acid going backward from your stomach into your esophagus. Most of the time, nonspecific chest pain will improve within 2-3 days with rest and mild pain medicine.  HOME CARE INSTRUCTIONS   If antibiotics were prescribed, take them as directed. Finish them even if you start to feel better.  For the next few days, avoid physical activities that bring on chest pain. Continue physical activities as directed.  Do not use any tobacco products, including cigarettes, chewing tobacco, or electronic cigarettes.  Avoid drinking alcohol.  Only take medicine as directed by your health care provider.  Follow your health care provider's suggestions for further testing if your chest pain does not go away.  Keep any follow-up appointments you made. If you do not go to an appointment, you could develop lasting (chronic) problems with pain. If there is any problem keeping an appointment, call to reschedule. SEEK MEDICAL CARE IF:   Your chest pain does not go away, even after treatment.  You have a rash with blisters on your chest.  You have a fever. SEEK IMMEDIATE MEDICAL CARE IF:   You have increased chest pain or pain that spreads to your arm, neck, jaw, back, or abdomen.  You have shortness of breath.  You have an increasing cough, or you cough up blood.  You have severe back or abdominal pain.  You feel nauseous or vomit.  You have severe weakness.  You faint.  You have chills. This is an emergency. Do not wait to see if the pain will go away. Get medical help at once. Call your local emergency services (911 in [place] Do not drive yourself to the hospital. MAKE SURE YOU:   Understand these instructions.  Will watch your condition.  Will get help right away if you are not doing well or get worse. Document Released: 08/24/2005 Document Revised:  11/19/2013 Document Reviewed: 06/19/2008 [facility] Patient Information 2015 [place], [place]. This information is not intended to replace advice given to you by your health care provider. Make sure you discuss any questions you have with your health care provider.  Shortness of Breath Shortness of breath means you have trouble breathing. It could also mean that you have a medical problem. You should get immediate medical care for shortness of breath. CAUSES   Not enough oxygen in the air such as with high altitudes or a smoke-filled room.  Certain lung diseases, infections, or problems.  Heart disease or conditions, such as angina or heart failure.  Low red blood cells (anemia).  Poor physical fitness, which can cause shortness of breath when you exercise.  Chest or back injuries or stiffness.  Being overweight.  Smoking.  Anxiety, which can make you feel like you are not getting enough air. DIAGNOSIS  Serious medical problems can often be found during your physical exam. Tests may also be done to determine why you are having shortness of breath. Tests may include:  Chest X-rays.  Lung function tests.  Blood tests.  An electrocardiogram (ECG).  An ambulatory electrocardiogram. An ambulatory ECG records your heartbeat patterns over a 24-hour period.  Exercise testing.  A transthoracic echocardiogram (TTE). During echocardiography, sound waves are used to evaluate how blood flows through your heart.  A transesophageal echocardiogram (TEE).  Imaging scans. Your health care provider may not be able to find a cause for your shortness of breath after your exam. In this case, it is important to have a follow-up exam with your health care provider as directed.  TREATMENT  Treatment for shortness of breath depends on the cause of your symptoms and can vary greatly. HOME CARE INSTRUCTIONS   Do not smoke. Smoking is a common cause of shortness of breath. If you smoke, ask for help  to quit.  Avoid being around chemicals or things that may bother your breathing, such as paint fumes and dust.  Rest as needed. Slowly resume your usual activities.  If medicines were prescribed, take them as directed for the full length of time directed. This includes oxygen and any inhaled medicines.  Keep all follow-up appointments as directed by your health care provider. SEEK MEDICAL CARE IF:   Your condition does not improve in the time expected.  You have a hard time doing your normal activities even with rest.  You have any new symptoms. SEEK IMMEDIATE MEDICAL CARE IF:   Your shortness of breath gets worse.  You feel light-headed, faint, or develop a cough not controlled with medicines.  You start coughing up blood.  You have pain with breathing.  You have chest pain or pain in your arms, shoulders, or abdomen.  You have a fever.  You are unable to walk up stairs or exercise the way you normally do. MAKE SURE YOU:  Understand these instructions.  Will watch your condition.  Will get help right away if you are not doing well or get worse. Document Released: 08/09/2001 Document Revised: 11/19/2013 Document Reviewed: 01/30/2012 [facility] Patient Information 2015 [place], [place]. This information is not intended to replace advice given to you by your health care provider. Make sure you discuss any questions you have with your health care provider.

## 2015-08-07 NOTE — ED Notes (Signed)
SOB since having endoscopy last week. States her shoulders hurt. Nausea. She wakes up at night gasping for air.

## 2015-08-07 NOTE — ED Provider Notes (Signed)
CSN: [id number]     Arrival date & time 08/07/15  1115 History   First MD Initiated Contact with Patient 08/07/15 1139     Chief Complaint  Patient presents with  . Shortness of Breath     (Consider location/radiation/quality/duration/timing/severity/associated sxs/prior Treatment) Patient is a 44 y.o. female presenting with shortness of breath.  Shortness of Breath Severity:  Moderate Onset quality:  Gradual Duration:  10 days Timing:  Constant Progression:  Unchanged Chronicity:  New Context comment:  Symptoms present since EGD on 07/28/2015, about 10 days ago Relieved by:  Rest Worsened by:  Activity, movement and exertion Associated symptoms: chest pain   Associated symptoms: no cough and no fever   Associated symptoms comment:  Nausea   Past Medical History  Diagnosis Date  . Esophageal stricture   . Chest pain 09/14/2009    no pulmonary embolus by chest CT  . Palpitations   . Elevated BP   . Anxiety     past Hx  . Depression     past Hx  . GERD (gastroesophageal reflux disease)     no meds  . Sickle cell trait   . Fibroid   . Abnormal Pap smear     cryo, normal since   Past Surgical History  Procedure Laterality Date  . Cesarean section  2004  . Laparoscopy for ectopic pregnancy  2001  . Wisdom tooth extraction  2012   Family History  Problem Relation Age of Onset  . Asthma Mother   . Hypertension Mother   . Hypertension Paternal Uncle   . Asthma Maternal Grandmother   . Colon cancer Neg Hx   . Esophageal cancer Neg Hx   . Stomach cancer Neg Hx    Social History  Substance Use Topics  . Smoking status: Never Smoker   . Smokeless tobacco: Never Used  . Alcohol Use: 0.0 oz/week    0 Standard drinks or equivalent per week     Comment: social   OB History    Gravida Para Term Preterm AB TAB SAB Ectopic Multiple Living   6 2 2  0 3 0 0 3 0 2     Review of Systems  Constitutional: Negative for fever.  Respiratory: Positive for shortness of  breath. Negative for cough.   Cardiovascular: Positive for chest pain.  All other systems reviewed and are negative.     Allergies  Diphenhydramine hcl; Hydromorphone hcl; and Morphine  Home Medications   Prior to Admission medications   Medication Sig Start Date End Date Taking? Authorizing Provider  aspirin 81 MG tablet Take 162 mg by mouth daily as needed for pain.    Historical Provider, MD  clonazePAM (KLONOPIN) 0.5 MG tablet Take 1 tablet (0.5 mg total) by mouth 2 (two) times daily as needed for anxiety. Or prior to flying Patient not taking: Reported on 07/28/2015 07/17/15   Debbrah Alar, NP  escitalopram (LEXAPRO) 10 MG tablet 1/2 tab by mouth once daily for 1 week, then increase to a full tab on week two Patient not taking: Reported on 07/28/2015 07/17/15   Debbrah Alar, NP  famotidine (PEPCID AC) 10 MG chewable tablet Chew 10 mg by mouth 2 (two) times daily as needed for heartburn.    Historical Provider, MD  ibuprofen (ADVIL,MOTRIN) 200 MG tablet Take 200 mg by mouth every 6 (six) hours as needed.    Historical Provider, MD  pantoprazole (PROTONIX) 40 MG tablet Take 1 tablet (40 mg total) by mouth 2 (two)  times daily. 06/10/15   Amy S Esterwood, PA-C   BP 156/97 mmHg  Pulse 96  Temp(Src) 99.7 F (37.6 C) (Oral)  Resp 18  Ht 5' 5.5" (1.664 m)  Wt 201 lb (91.173 kg)  BMI 32.93 kg/m2  SpO2 100%  LMP 07/20/2015 Physical Exam  Constitutional: She is oriented to person, place, and time. She appears well-developed and well-nourished. No distress.  HENT:  Head: Normocephalic and atraumatic.  Mouth/Throat: Oropharynx is clear and moist.  Eyes: Conjunctivae are normal. Pupils are equal, round, and reactive to light. No scleral icterus.  Neck: Neck supple.  Cardiovascular: Normal rate, regular rhythm, normal heart sounds and intact distal pulses.   No murmur heard. Pulmonary/Chest: Effort normal and breath sounds normal. No stridor. No respiratory distress. She has no  rales. She exhibits tenderness (sternal palpation).  Abdominal: Soft. Bowel sounds are normal. She exhibits no distension. There is no tenderness.  Musculoskeletal: Normal range of motion.  Neurological: She is alert and oriented to person, place, and time.  Skin: Skin is warm and dry. No rash noted.  Psychiatric: She has a normal mood and affect. Her behavior is normal.  Nursing note and vitals reviewed.   ED Course  Procedures (including critical care time) Labs Review Labs Reviewed  CBC WITH DIFFERENTIAL/PLATELET - Abnormal; Notable for the following:    HCT 34.8 (*)    All other components within normal limits  BASIC METABOLIC PANEL - Abnormal; Notable for the following:    Calcium 8.8 (*)    All other components within normal limits  TROPONIN I  URINALYSIS, ROUTINE W REFLEX MICROSCOPIC (NOT AT [facility])  BRAIN NATRIURETIC PEPTIDE  PREGNANCY, URINE    Imaging Review Dg Chest 2 View  08/07/2015   CLINICAL DATA:  Chest pain and shortness of breath for 1 week after endoscopy.  EXAM: CHEST  2 VIEW  COMPARISON:  07/15/2015  FINDINGS: Stable mild elevation of the right hemidiaphragm. Both lungs are clear. Heart and mediastinum are within normal limits. Trachea is midline. No acute bone abnormality.  IMPRESSION: No active cardiopulmonary disease.   Electronically Signed   By: Markus Daft M.D.   On: 08/07/2015 13:00   I have personally reviewed and evaluated these images and lab results as part of my medical decision-making.   EKG Interpretation   Date/Time:  Friday August 07 2015 11:29:27 EDT Ventricular Rate:  75 PR Interval:  144 QRS Duration: 94 QT Interval:  368 QTC Calculation: 410 R Axis:   68 Text Interpretation:  Normal sinus rhythm Incomplete right bundle branch  block Borderline ECG compared to prior, isolated t  wave inversion  resolved.  Confirmed by [facility]  MD, TREY ([id number]) on 08/07/2015 3:58:04 PM      MDM   Final diagnoses:  Shortness of breath  Chest pain,  unspecified chest pain type    44 yo female with 10 days of SOB and chest pain.  Exam reassuring except for anterior chest wall tenderness.  History unlikely to represent PE and she is PERC negative.  Also unlikely to be ACS and troponin negative after 10 days of symptoms.  No evidence of esophageal rupture after her EGD.  Can't take NSAIDs, but advised other supportive treatment.      Serita Grit, MD 08/07/15 [phone]

## 2015-08-10 ENCOUNTER — Encounter: Payer: Self-pay | Admitting: Internal Medicine

## 2015-08-10 ENCOUNTER — Ambulatory Visit (INDEPENDENT_AMBULATORY_CARE_PROVIDER_SITE_OTHER): Payer: BLUE CROSS/BLUE SHIELD | Admitting: Internal Medicine

## 2015-08-10 ENCOUNTER — Telehealth: Payer: Self-pay | Admitting: Internal Medicine

## 2015-08-10 VITALS — BP 112/74 | HR 87 | Ht 66.0 in | Wt 201.0 lb

## 2015-08-10 DIAGNOSIS — R0600 Dyspnea, unspecified: Secondary | ICD-10-CM | POA: Diagnosis not present

## 2015-08-10 DIAGNOSIS — E669 Obesity, unspecified: Secondary | ICD-10-CM | POA: Diagnosis not present

## 2015-08-10 NOTE — Telephone Encounter (Signed)
Left message for pt to call back  °

## 2015-08-10 NOTE — Progress Notes (Signed)
Subjective:    Patient ID: Stacy [name], female    DOB: 1971/05/01,   MRN: [id number]  HPI  57 yobf never smoker with some seasonal eyes running in spring not severe enough for otc's then aournd 03/2015  noted nocturnal gasping for air assoc with sore throat then since 06/2105 sob with activity > rest and referred by ER where eval cp felt to be due to reflux > to pulmonary clinic. 08/10/2015    08/10/2015 1st [facility] office visit/ [facility]   Chief Complaint  Patient presents with  . Pulmonary Consult    Self referral. Pt c/o SOB for the past 4 months. She states that she gets SOB walking short distances and states that she wakes up in the night gasping for air approx every 2 hours.    started nexium/ pepcid then protonix bid but not ac and still having overt symptoms of HB esp at hs. Has not tried elevation of hob.  On 01/29/15 while on GERD rx seen in rx with presumptive gastroenteritis and vomiting resolved but ever since then never completely back to nl with almost constant cheszt burning.  Worse first thing in am ? Some better p pepcid / burning is anterior diffuse, not pleuritic or exertional "mother and father have the same thing" assoc with sob x 50 ft and freq also at rest assoc with urge to clear throat.    No obvious day to day or daytime variability or assoc excess or purulent sputum or chest tightness, subjective wheeze or overt sinus symptoms. No unusual exp hx or h/o childhood pna/ asthma or knowledge of premature birth.  Sleeping ok without nocturnal  or early am exacerbation  of respiratory  c/o's or need for noct saba. Also denies any obvious fluctuation of symptoms with weather or environmental changes or other aggravating or alleviating factors except as outlined above   Current Medications, Allergies, Complete Past Medical History, Past Surgical History, Family History, and Social History were reviewed in [employer] record.  ROS  The  following are not active complaints unless bolded sore throat, dysphagia, dental problems, itching, sneezing,  nasal congestion or excess/ purulent secretions, ear ache,   fever, chills, sweats, unintended wt loss, classically pleuritic or exertional cp, hemoptysis,  orthopnea pnd or leg swelling, presyncope, palpitations, abdominal pain, anorexia, nausea, vomiting, diarrhea  or change in bowel or bladder habits, change in stools or urine, dysuria,hematuria,  rash, arthralgias, visual complaints, headache, numbness, weakness or ataxia or problems with walking or coordination,  change in mood/affect or memory.          Review of Systems  Constitutional: Negative for fever, chills and unexpected weight change.  HENT: Positive for congestion, sinus pressure, sore throat and trouble swallowing. Negative for dental problem, ear pain, nosebleeds, postnasal drip, rhinorrhea, sneezing and voice change.   Eyes: Negative for visual disturbance.  Respiratory: Positive for shortness of breath. Negative for cough and choking.   Cardiovascular: Negative for chest pain and leg swelling.  Gastrointestinal: Negative for vomiting, abdominal pain and diarrhea.  Genitourinary: Negative for difficulty urinating.  Musculoskeletal: Negative for arthralgias.  Skin: Negative for rash.  Neurological: Positive for headaches. Negative for tremors and syncope.  Hematological: Does not bruise/bleed easily.       Objective:   Physical Exam  Wt Readings from Last 3 Encounters:  08/10/15 201 lb (91.173 kg)  08/07/15 201 lb (91.173 kg)  07/28/15 201 lb (91.173 kg)    Vital signs reviewed  HEENT: nl dentition, turbinates, and orophanx. Nl external ear canals without cough reflex   NECK :  without JVD/Nodes/TM/ nl carotid upstrokes bilaterally   LUNGS: no acc muscle use, clear to A and P bilaterally without cough on insp or exp maneuvers   CV:  RRR  no s3 or murmur or increase in P2, no edema   ABD:  soft  and nontender with nl excursion in the supine position. No bruits or organomegaly, bowel sounds nl  MS:  warm without deformities, calf tenderness, cyanosis or clubbing  SKIN: warm and dry without lesions    NEURO:  alert, approp, no deficits      I personally reviewed images and agree with radiology impression as follows:  CXR:  08/07/15 No active cardiopulmonary disease.   Labs  reviewed:     Recent Labs Lab 08/07/15 1230  NA 139  K 3.9  CL 105  CO2 27  BUN 10  CREATININE 0.62  GLUCOSE 89       Lab 08/07/15 1230  HGB 12.0  HCT 34.8*  WBC 5.3  PLT 353     Lab Results  Component Value Date   TSH 1.57 04/17/2015     Lab Results  Component Value Date   PROBNP 17.3 02/20/2014           Assessment & Plan:

## 2015-08-10 NOTE — Patient Instructions (Signed)
Pantoprazole 40 mg Take 30- 60 min before your first and last meals of the day and pepcid 20mg  and CHLORPHENIRAMINE  4 mg x 1 or 2 at bedtime  GERD (REFLUX)  is an extremely common cause of respiratory symptoms just like yours , many times with no obvious heartburn at all.    It can be treated with medication, but also with lifestyle changes including elevation of the head of your bed (ideally with 6 inch  bed blocks),  Smoking cessation, avoidance of late meals, excessive alcohol, and avoid fatty foods, chocolate, peppermint, colas, red wine, and acidic juices such as orange juice.  NO MINT OR MENTHOL PRODUCTS SO NO COUGH DROPS  USE SUGARLESS CANDY INSTEAD (Jolley ranchers or Stover's or Life Savers) or even ice chips will also do - the key is to swallow to prevent all throat clearing. NO OIL BASED VITAMINS - use powdered substitutes.    Please schedule a follow up office visit in 2 weeks, sooner if needed

## 2015-08-11 ENCOUNTER — Encounter: Payer: Self-pay | Admitting: Internal Medicine

## 2015-08-11 MED ORDER — FAMOTIDINE 20 MG PO TABS
ORAL_TABLET | ORAL | Status: DC
Start: 2015-08-11 — End: 2015-09-16

## 2015-08-11 NOTE — Assessment & Plan Note (Signed)
Body mass index is 32.46 kg/(m^2).  Lab Results  Component Value Date   TSH 1.57 04/17/2015     Contributing to gerd tendency/ doe/reviewed need  achieve and maintain neg calorie balance > defer f/u primary care including intermittently monitoring thyroid status

## 2015-08-11 NOTE — Assessment & Plan Note (Signed)
-   EGD  07/28/15 wnl  - 08/10/2015  Walked RA x 3 laps @ 185 ft each stopped due to  End of study, fast pace, no desat  - min sob   Symptoms are markedly disproportionate to objective findings and not clear this is a lung problem but pt does appear to have difficult airway management issues. DDX of  difficult airways management all start with A and  include Adherence, Ace Inhibitors, Acid Reflux, Active Sinus Disease, Alpha 1 Antitripsin deficiency, Anxiety masquerading as Airways dz,  ABPA,  allergy(esp in young), Aspiration (esp in elderly), Adverse effects of meds,  Active smokers, A bunch of PE's (a small clot burden can't cause this syndrome unless there is already severe underlying pulm or vascular dz with poor reserve) plus two Bs  = Bronchiectasis and Beta blocker use..and one C= CHF  Adherence is always the initial "prime suspect" and is a multilayered concern that requires a "trust but verify" approach in every patient - starting with knowing how to use medications, especially inhalers, correctly, keeping up with refills and understanding the fundamental difference between maintenance and prns vs those medications only taken for a very short course and then stopped and not refilled.    ? Acid (or non-acid) GERD > always difficult to exclude as up to 75% of pts in some series report no assoc GI/ Heartburn symptoms> rec max (24h)  acid suppression and diet restrictions/ reviewed and instructions given in writing.  -- Suggested she always take her PPI 30 minutes before meals and add Pepcid as well as chlorpheniramine at bedtime to see if we can help her with her nocturnal symptoms and critical she try elevation of HOB   ? Anxiety > usually diagnosis of exclusion but much higher on the list here   ? A bunch of PEs' > very very unlikely given her exercise performance here today though she is at risk due to obesity (see sep a/p)    I had an extended discussion with the patient reviewing all  relevant studies completed to date and  lasting  16 m  Each maintenance medication was reviewed in detail including most importantly the difference between maintenance and prns and under what circumstances the prns are to be triggered using an action plan format that is not reflected in the computer generated alphabetically organized AVS.    Please see instructions for details which were reviewed in writing and the patient given a copy highlighting the part that I personally wrote and discussed at today's ov.

## 2015-08-13 ENCOUNTER — Ambulatory Visit (INDEPENDENT_AMBULATORY_CARE_PROVIDER_SITE_OTHER): Payer: BLUE CROSS/BLUE SHIELD | Admitting: Medical

## 2015-08-13 ENCOUNTER — Ambulatory Visit: Payer: BLUE CROSS/BLUE SHIELD | Admitting: Medical

## 2015-08-13 ENCOUNTER — Telehealth: Payer: Self-pay | Admitting: Medical

## 2015-08-13 ENCOUNTER — Encounter: Payer: Self-pay | Admitting: Medical

## 2015-08-13 VITALS — BP 118/78 | HR 90 | Temp 98.1°F | Resp 16 | Ht 66.0 in | Wt 201.0 lb

## 2015-08-13 DIAGNOSIS — K219 Gastro-esophageal reflux disease without esophagitis: Secondary | ICD-10-CM

## 2015-08-13 DIAGNOSIS — R5383 Other fatigue: Secondary | ICD-10-CM

## 2015-08-13 DIAGNOSIS — M2550 Pain in unspecified joint: Secondary | ICD-10-CM | POA: Diagnosis not present

## 2015-08-13 DIAGNOSIS — R110 Nausea: Secondary | ICD-10-CM

## 2015-08-13 LAB — COMPREHENSIVE METABOLIC PANEL
ALK PHOS: 47 U/L (ref 39–117)
ALT: 10 U/L (ref 0–35)
AST: 13 U/L (ref 0–37)
Albumin: 3.9 g/dL (ref 3.5–5.2)
BILIRUBIN TOTAL: 0.4 mg/dL (ref 0.2–1.2)
BUN: 8 mg/dL (ref 6–23)
CO2: 27 mEq/L (ref 19–32)
Calcium: 9.1 mg/dL (ref 8.4–10.5)
Chloride: 103 mEq/L (ref 96–112)
Creatinine, Ser: 0.58 mg/dL (ref 0.40–1.20)
GFR: 146.65 mL/min (ref >60.00)
GLUCOSE: 81 mg/dL (ref 70–99)
POTASSIUM: 3.8 meq/L (ref 3.5–5.1)
SODIUM: 136 meq/L (ref 135–145)
TOTAL PROTEIN: 7.7 g/dL (ref 6.0–8.3)

## 2015-08-13 LAB — SEDIMENTATION RATE: Sed Rate: 43 mm/hr — ABNORMAL HIGH (ref 0–22)

## 2015-08-13 LAB — D-DIMER, QUANTITATIVE: D-Dimer, Quant: 0.64 ug/mL-FEU — ABNORMAL HIGH (ref 0.00–0.48)

## 2015-08-13 LAB — RHEUMATOID FACTOR: Rhuematoid fact SerPl-aCnc: <10 IU/mL (ref <=14)

## 2015-08-13 LAB — C-REACTIVE PROTEIN: CRP: 0.6 mg/dL (ref 0.5–20.0)

## 2015-08-13 MED ORDER — METOCLOPRAMIDE HCL 10 MG PO TABS: 10.0000 mg | ORAL_TABLET | Freq: Three times a day (TID) | ORAL | Status: DC

## 2015-08-13 NOTE — Telephone Encounter (Signed)
Pt was seen by pulmonary and her PCP. Reglan was added to her medications to see if that helps.

## 2015-08-13 NOTE — Progress Notes (Signed)
Pre visit review using our clinic review tool, if applicable. No additional management support is needed unless otherwise documented below in the visit note. 

## 2015-08-13 NOTE — Telephone Encounter (Signed)
Anderson [place] would you look at her ct angiogram order. Can you work on that first thing in the am.

## 2015-08-13 NOTE — Progress Notes (Signed)
   Subjective:    Patient ID: Stacy [name], female    DOB: [birth year], 44 y.o.   MRN: [id number]  HPI  Pt in with some nausea. Very bad summer with gerd. August 30th had endoscopy. Pt had egd. She had 2 other egd in her life. Most recent study neg.  Pt went to the ED just recently. nml wbc. Metabolic panel good. Mild low CA. Troponin, and bnp normal. Urine clear. Pregnancy test negative.  Pt went to pulmonology and they told her has reflux. Pt told to use ppi. She dis not use this yet. Pt does use protonix and pepcid.  Pt ate night feels burning in esophagus. She does not sleep well.  Pt went to [facility] on Monday. She states has sinus infection. They have her augmentin. Has been on antibiotic for 4 days.  Pt also states this September 1st some shoulder aches.   Occasional short of breath at times. Rare espisodes. And pt speculated on possibility of PE. She has no leg pain. No popliteal pain.  Review of Systems  Constitutional: Positive for fatigue.  Respiratory: Positive for cough. Negative for chest tightness, shortness of breath and wheezing.        Slight reflux cough.  Cardiovascular: Negative for palpitations.  Gastrointestinal: Positive for nausea.  Musculoskeletal:       Shoulder pain.  Neurological: Negative for dizziness and headaches.  Hematological: Negative for adenopathy. Does not bruise/bleed easily.  Psychiatric/Behavioral: Positive for sleep disturbance. Negative for suicidal ideas, decreased concentration and agitation. The patient is not nervous/anxious.        But she attributes to her gi symptoms/burn not anxiety.       Objective:   Physical Exam  General Mental Status- Alert. General Appearance- Not in acute distress.   Skin General: Color- Normal Color. Moisture- Normal Moisture.  Neck Carotid Arteries- Normal color. Moisture- Normal Moisture. No carotid bruits. No JVD.  Chest and Lung Exam Auscultation: Breath  Sounds:-Normal.  Cardiovascular Auscultation:Rythm- Regular. Murmurs & Other Heart Sounds:Auscultation of the heart reveals- No Murmurs.  Abdomen Inspection:-Inspeection Normal. Palpation/Percussion:Note:No mass. Palpation and Percussion of the abdomen reveal- faint epigastric  Tender, Non Distended + BS, no rebound or guarding.    Neurologic Cranial Nerve exam:- CN III-XII intact(No nystagmus), symmetric smile. Strength:- 5/5 equal and symmetric strength both upper and lower extremities.  Shoulders- good rom. But faint diffuse pain on movement.  Heent- negative except for faint maxillary sinus pressure.  Lower ext- no pedal edema. Negative homans signs.       Assessment & Plan:  Will add reglan to your protonix and pepcid regimen.  You had extensive work up in ED. Evaluation by pulmonologist as well. But will add arthtitis panel for your shoulder pain, For fatigue will get cmp/lft, and d-dimer(due to your concern occasional dyspnea). If dimer elevated would require ct thorax.  For sinus infection continue antibiotic augmentin.  Follow up in 7 days or as needed

## 2015-08-13 NOTE — Patient Instructions (Addendum)
[id number].  Will add reglan to your protonix and pepcid regimen.  You had extensive work up in ED. Evaluation by pulmonologist as well. But will add arthtitis panel for your shoulder pain, For fatigue will get cmp/lft, and d-dimer(due to your concern occasional dyspnea). If dimer elevated would require ct thorax.  For sinus infection continue antibiotic augmentin.  Follow up in 7 days or as needed

## 2015-08-13 NOTE — Telephone Encounter (Signed)
I notified pt of her sed rate elevation and mild d-dimer elevation. Her dimer had been elevated 3 yrs ago. Today level is lower than it was 3 yrs ago. But still elevated. 3 yr aog ct angiogram was negative. With recent episodes of intermittent sob will get ct angiogram. Try to schedule that tomorrow morning stat.  Pt is not sob now. No chest pain. I advised can wait until tomorrow for [place] to schedule. If her symptoms change during the interim then go to ED tonight.

## 2015-08-14 ENCOUNTER — Encounter (HOSPITAL_BASED_OUTPATIENT_CLINIC_OR_DEPARTMENT_OTHER): Payer: Self-pay

## 2015-08-14 ENCOUNTER — Ambulatory Visit (HOSPITAL_BASED_OUTPATIENT_CLINIC_OR_DEPARTMENT_OTHER)
Admission: RE | Admit: 2015-08-14 | Discharge: 2015-08-14 | Disposition: A | Discharge status: Home / Self Care | Payer: BLUE CROSS/BLUE SHIELD | Source: Ambulatory Visit | Attending: Medical | Admitting: Medical

## 2015-08-14 DIAGNOSIS — R0602 Shortness of breath: Secondary | ICD-10-CM | POA: Diagnosis not present

## 2015-08-14 DIAGNOSIS — R7989 Other specified abnormal findings of blood chemistry: Secondary | ICD-10-CM

## 2015-08-14 DIAGNOSIS — R791 Abnormal coagulation profile: Secondary | ICD-10-CM | POA: Insufficient documentation

## 2015-08-14 DIAGNOSIS — R0600 Dyspnea, unspecified: Secondary | ICD-10-CM

## 2015-08-14 LAB — ANA: ANA: NEGATIVE

## 2015-08-14 MED ORDER — PREDNISONE 10 MG PO TABS: 10.0000 mg | ORAL_TABLET | Freq: Every day | ORAL | Status: DC

## 2015-08-14 MED ORDER — IOHEXOL 350 MG/ML SOLN
100.0000 mL | Freq: Once | INTRAVENOUS | Status: AC | PRN
  Administered 2015-08-14: 100 mL via INTRAVENOUS

## 2015-08-14 NOTE — Telephone Encounter (Signed)
I did talk with pt on neg ct chest report. And neg ra panel except for moderate sed rate elevation. Will give low dose prednisone 10 mg over next 5 days. Advise pt to follow up next week with pcp on wed or Thursday. As needed before then. Note with patient varied presentation she did not report any temporal ha when I saw her. She is taking antibiotic for sinus infection. So will not give high dose steroid presently.

## 2015-08-14 NOTE — Telephone Encounter (Signed)
I did talk with pt and discussed neg ct chest. And neg ra panel except for moderate sed rate elevation. Will give

## 2015-08-21 ENCOUNTER — Encounter: Payer: BLUE CROSS/BLUE SHIELD | Admitting: Cardiovascular Disease

## 2015-08-21 NOTE — Progress Notes (Signed)
This encounter was created in error - please disregard.

## 2015-08-24 ENCOUNTER — Ambulatory Visit (INDEPENDENT_AMBULATORY_CARE_PROVIDER_SITE_OTHER): Payer: BLUE CROSS/BLUE SHIELD | Admitting: Internal Medicine

## 2015-08-24 ENCOUNTER — Encounter: Payer: Self-pay | Admitting: Internal Medicine

## 2015-08-24 VITALS — BP 132/86 | HR 68 | Ht 66.0 in | Wt 203.0 lb

## 2015-08-24 DIAGNOSIS — E669 Obesity, unspecified: Secondary | ICD-10-CM

## 2015-08-24 DIAGNOSIS — R0600 Dyspnea, unspecified: Secondary | ICD-10-CM | POA: Diagnosis not present

## 2015-08-24 MED ORDER — METHYLPREDNISOLONE ACETATE 80 MG/ML IJ SUSP
120.0000 mg | Freq: Once | INTRAMUSCULAR | Status: AC
  Administered 2015-08-24: 120 mg via INTRAMUSCULAR

## 2015-08-24 NOTE — Progress Notes (Signed)
Subjective:    Patient ID: Stacy [name], female    DOB: 06-24-1971   MRN: [id number]    Brief patient profile:  51 yobf never smoker with some seasonal eyes running in spring not severe enough for otc's then aournd 03/2015  noted nocturnal gasping for air assoc with sore throat then since 06/2105 sob with activity > rest and referred by ER where eval cp felt to be due to reflux > to pulmonary clinic  08/10/2015 fpr unexplained sob/cp    History of Present Illness  08/10/2015 1st [facility] office visit/ [facility]   Chief Complaint  Patient presents with  . Pulmonary Consult    Self referral. Pt c/o SOB for the past 4 months. She states that she gets SOB walking short distances and states that she wakes up in the night gasping for air approx every 2 hours.    started nexium/ pepcid then protonix bid but not ac and still having overt symptoms of HB esp at hs. Has not tried elevation of hob. On 01/29/15 while on GERD rx seen in ER with presumptive gastroenteritis and vomiting resolved but ever since then never completely back to nl with almost constant cheszt burning.  Worse first thing in am ? Some better p pepcid / burning is anterior diffuse, not pleuritic or exertional "mother and father have the same thing" assoc with sob x 50 ft and freq also at rest assoc with urge to clear throat. rec Pantoprazole 40 mg Take 30- 60 min before your first and last meals of the day and pepcid 20mg  and CHLORPHENIRAMINE  4 mg x 1 or 2 at bedtime GERD diet      08/24/2015  f/u ov/[facility] re:  Unexplained sob x 50 ft "every time I walk anywhere"  Chief Complaint  Patient presents with  . Follow-up    Pt c/o nausea, shoulder pain, and SOB since 08/23/15. Denies any fever, cough, chest congestion, and sinus drainage.   new bilateral shoulder aches/ neck/ upper back since last ov positonal, not related to ex which is limted by sob  Gasping for breath  and sorethroat and burning cp all resolved on gerd  rx   No obvious day to day or daytime variability or assoc excess or purulent sputum or chest tightness, subjective wheeze or overt sinus symptoms. No unusual exp hx or h/o childhood pna/ asthma or knowledge of premature birth.  Sleeping ok without nocturnal  or early am exacerbation  of respiratory  c/o's or need for noct saba. Also denies any obvious fluctuation of symptoms with weather or environmental changes or other aggravating or alleviating factors except as outlined above   Current Medications, Allergies, Complete Past Medical History, Past Surgical History, Family History, and Social History were reviewed in [employer] record.  ROS  The following are not active complaints unless bolded sore throat, dysphagia, dental problems, itching, sneezing,  nasal congestion or excess/ purulent secretions, ear ache,   fever, chills, sweats, unintended wt loss, classically pleuritic or exertional cp, hemoptysis,  orthopnea pnd or leg swelling, presyncope, palpitations, abdominal pain, anorexia, nausea, vomiting, diarrhea  or change in bowel or bladder habits, change in stools or urine, dysuria,hematuria,  rash, arthralgias, visual complaints, headache, numbness, weakness or ataxia or problems with walking or coordination,  change in mood/affect or memory.             Objective:   Physical Exam    08/24/2015      203  Wt Readings from Last 3 Encounters:  08/10/15 201 lb (91.173 kg)  08/07/15 201 lb (91.173 kg)  07/28/15 201 lb (91.173 kg)    Vital signs reviewed    HEENT: nl dentition, turbinates, and orophanx. Nl external ear canals without cough reflex   NECK :  without JVD/Nodes/TM/ nl carotid upstrokes bilaterally   LUNGS: no acc muscle use, clear to A and P bilaterally without cough on insp or exp maneuvers   CV:  RRR  no s3 or murmur or increase in P2, no edema   ABD:  soft and nontender with nl excursion in the supine position. No bruits or  organomegaly, bowel sounds nl  MS:  warm without deformities, calf tenderness, cyanosis or clubbing  SKIN: warm and dry without lesions    NEURO:  alert, approp, no deficits      I personally reviewed images and agree with radiology impression as follows:  CXR:  08/07/15 No active cardiopulmonary disease.      Labs  reviewed:    Lab 08/07/15 1230  NA 139  K 3.9  CL 105  CO2 27  BUN 10  CREATININE 0.62  GLUCOSE 89       Lab 08/07/15 1230  HGB 12.0  HCT 34.8*  WBC 5.3  PLT 353     Lab Results  Component Value Date   TSH 1.57 04/17/2015     Lab Results  Component Value Date   PROBNP 17.3 02/20/2014           Assessment & Plan:

## 2015-08-24 NOTE — Patient Instructions (Addendum)
Depomedrol 120 mg today   For two weeks try:  Pantoprazole 40 mg Take 30- 60 min before your first and last meals of the day and pepcid 20mg  and CHLORPHENIRAMINE  4 mg x 1 or 2 at bedtime If not convinced improving, stop all these medicines and then restart if worsen   Pulmonary follow up is with all meds in hand if need to return including all over the counters

## 2015-08-26 ENCOUNTER — Ambulatory Visit: Payer: BLUE CROSS/BLUE SHIELD | Admitting: Cardiovascular Disease

## 2015-08-26 NOTE — Telephone Encounter (Signed)
Yes. Please charge no-show.

## 2015-08-26 NOTE — Telephone Encounter (Signed)
Will forward this to PCP for consideration of dismissal due to frequent no-show

## 2015-08-26 NOTE — Telephone Encounter (Signed)
Pt was no show 08/07/15 for acute appt, pt came in 9/15 and saw Percell Miller for different acute issue, this is 3rd no show at [facility] since June 2016, charge for no show?

## 2015-08-27 ENCOUNTER — Encounter: Payer: Self-pay | Admitting: Family

## 2015-08-28 ENCOUNTER — Encounter: Payer: Self-pay | Admitting: Internal Medicine

## 2015-08-28 NOTE — Assessment & Plan Note (Signed)
Body mass index is 32.78  >  trending up   Lab Results  Component Value Date   TSH 1.57 04/17/2015     Contributing to gerd tendency/ doe/reviewed the need and the process to achieve and maintain neg calorie balance > defer f/u primary care including intermittently monitoring thyroid status

## 2015-08-28 NOTE — Assessment & Plan Note (Signed)
-   EGD  07/28/15 wnl  - 08/10/2015  Walked RA x 3 laps @ 185 ft each stopped due to  End of study, fast pace, no desat  - min sob  - 08/24/2015  Walked RA x 3 laps @ 185 ft each stopped due to End of study, fast pace, no sob or desat      Unable to reproduce sob x 50 ft here in office and now complaints have shifted to neck and shoulder pain which do not relate to activity  I think she's better on max gerd rx   I had an extended final summary discussion with the patient reviewing all relevant studies completed to date and  lasting 15 to 20 minutes of a 25 minute visit on the following issues:    Unlike when you get a prescription for eyeglasses, it's not possible to always walk out of this or any medical office with a perfect prescription that is immediately effective  based on any test that we offer here.    On the contrary, it may take several weeks for the full impact of changes recommened today - hopefully you will respond well.  If not, then we'll adjust your medication on your next visit accordingly, knowing more then than we can possibly know now.    To do so we need to first be sure she's taking meds exactly as she says she is : needs to return with all meds in hand if not satisfied with breathing.   F/u primary care

## 2015-09-08 ENCOUNTER — Telehealth: Payer: Self-pay | Admitting: Family

## 2015-09-08 NOTE — Telephone Encounter (Signed)
Patient dismissed from [facility] by Debbrah Alar , effective August 27, 2015. Dismissal letter sent out by certified / registered mail. [facility]  Certified dismissal letter returned as undeliverable, unclaimed, return to sender after three attempts by [place]. Letter placed in another envelope and resent as 1st class mail which does not require a signature. 10/09/15 DAJ

## 2015-09-10 NOTE — Progress Notes (Signed)
This encounter was created in error - please disregard.

## 2015-09-11 ENCOUNTER — Encounter: Payer: BLUE CROSS/BLUE SHIELD | Admitting: Cardiovascular Disease

## 2015-09-14 ENCOUNTER — Emergency Department (HOSPITAL_COMMUNITY): Payer: BLUE CROSS/BLUE SHIELD

## 2015-09-14 ENCOUNTER — Emergency Department (HOSPITAL_COMMUNITY)
Admission: EM | Admit: 2015-09-14 | Discharge: 2015-09-14 | Disposition: A | Discharge status: Home / Self Care | Payer: BLUE CROSS/BLUE SHIELD | Attending: Emergency Medicine | Admitting: Emergency Medicine

## 2015-09-14 ENCOUNTER — Encounter (HOSPITAL_COMMUNITY): Payer: Self-pay | Admitting: Oncology

## 2015-09-14 DIAGNOSIS — R519 Headache, unspecified: Secondary | ICD-10-CM

## 2015-09-14 DIAGNOSIS — Z79899 Other long term (current) drug therapy: Secondary | ICD-10-CM | POA: Diagnosis not present

## 2015-09-14 DIAGNOSIS — Z3202 Encounter for pregnancy test, result negative: Secondary | ICD-10-CM | POA: Diagnosis not present

## 2015-09-14 DIAGNOSIS — R51 Headache: Secondary | ICD-10-CM | POA: Insufficient documentation

## 2015-09-14 DIAGNOSIS — Z86018 Personal history of other benign neoplasm: Secondary | ICD-10-CM | POA: Diagnosis not present

## 2015-09-14 DIAGNOSIS — Z8659 Personal history of other mental and behavioral disorders: Secondary | ICD-10-CM | POA: Insufficient documentation

## 2015-09-14 DIAGNOSIS — Z862 Personal history of diseases of the blood and blood-forming organs and certain disorders involving the immune mechanism: Secondary | ICD-10-CM | POA: Insufficient documentation

## 2015-09-14 DIAGNOSIS — K219 Gastro-esophageal reflux disease without esophagitis: Secondary | ICD-10-CM | POA: Diagnosis not present

## 2015-09-14 LAB — POC URINE PREG, ED: Preg Test, Ur: NEGATIVE

## 2015-09-14 MED ORDER — METOCLOPRAMIDE HCL 10 MG PO TABS: 10.0000 mg | ORAL_TABLET | Freq: Three times a day (TID) | ORAL | Status: DC | PRN

## 2015-09-14 MED ORDER — METOCLOPRAMIDE HCL 5 MG/ML IJ SOLN
10.0000 mg | Freq: Once | INTRAMUSCULAR | Status: AC
  Administered 2015-09-14: 10 mg via INTRAMUSCULAR
  Filled 2015-09-14: qty 2

## 2015-09-14 MED ORDER — KETOROLAC TROMETHAMINE 60 MG/2ML IM SOLN
60.0000 mg | Freq: Once | INTRAMUSCULAR | Status: AC
  Administered 2015-09-14: 60 mg via INTRAMUSCULAR
  Filled 2015-09-14: qty 2

## 2015-09-14 MED ORDER — DEXAMETHASONE SODIUM PHOSPHATE 10 MG/ML IJ SOLN
10.0000 mg | Freq: Once | INTRAMUSCULAR | Status: AC
  Administered 2015-09-14: 10 mg via INTRAMUSCULAR
  Filled 2015-09-14: qty 1

## 2015-09-14 NOTE — ED Provider Notes (Signed)
CSN: [id number]     Arrival date & time 09/14/15  0410 History   First MD Initiated Contact with Patient 09/14/15 [phone]     Chief Complaint  Patient presents with  . Migraine    multiple other c/o     (Consider location/radiation/quality/duration/timing/severity/associated sxs/prior Treatment) Patient is a 44 y.o. female presenting with migraines. The history is provided by the patient. No language interpreter was used.  Migraine This is a new problem. The current episode started yesterday. The problem occurs constantly. The problem has not changed since onset.Pertinent negatives include no chest pain, no abdominal pain and no shortness of breath. Nothing aggravates the symptoms. Nothing relieves the symptoms. She has tried nothing for the symptoms. The treatment provided no relief.  Ongoing symptoms with facial pain for a week.  No weakness nor numbness no facial droop no changes in speech or cognition.  Also states she has multiple chronic issues that no one can find a cause for.    Past Medical History  Diagnosis Date  . Esophageal stricture   . Chest pain 09/14/2009    no pulmonary embolus by chest CT  . Palpitations   . Elevated BP   . Anxiety     past Hx  . Depression     past Hx  . GERD (gastroesophageal reflux disease)     no meds  . Sickle cell trait ([place])   . Fibroid   . Abnormal Pap smear     cryo, normal since   Past Surgical History  Procedure Laterality Date  . Cesarean section  2004  . Laparoscopy for ectopic pregnancy  2001  . Wisdom tooth extraction  2012   Family History  Problem Relation Age of Onset  . Asthma Mother   . Hypertension Mother   . Hypertension Paternal Uncle   . Asthma Maternal Grandmother   . Colon cancer Neg Hx   . Esophageal cancer Neg Hx   . Stomach cancer Neg Hx   . Asthma Daughter    Social History  Substance Use Topics  . Smoking status: Never Smoker   . Smokeless tobacco: Never Used  . Alcohol Use: 0.0 oz/week    0  Standard drinks or equivalent per week     Comment: social   OB History    Gravida Para Term Preterm AB TAB SAB Ectopic Multiple Living   6 2 2  0 3 0 0 3 0 2     Review of Systems  HENT: Negative for congestion, drooling, ear pain and facial swelling.   Respiratory: Negative for shortness of breath.   Cardiovascular: Negative for chest pain, palpitations and leg swelling.  Gastrointestinal: Negative for abdominal pain.  Neurological: Negative for dizziness, tremors, seizures, syncope, facial asymmetry, speech difficulty, weakness, light-headedness and numbness.  All other systems reviewed and are negative.     Allergies  Diphenhydramine hcl; Hydromorphone hcl; and Morphine  Home Medications   Prior to Admission medications   Medication Sig Start Date End Date Taking? Authorizing Provider  acetaminophen (TYLENOL) 325 MG tablet Take 650 mg by mouth every 6 (six) hours as needed for mild pain.   Yes Historical Provider, MD  famotidine (PEPCID) 20 MG tablet One at bedtime Patient taking differently: Take 20 mg by mouth 2 (two) times daily.  08/11/15  Yes Tanda Rockers, MD  pantoprazole (PROTONIX) 40 MG tablet Take 1 tablet (40 mg total) by mouth 2 (two) times daily. Patient not taking: Reported on 09/14/2015 06/10/15   Amy  S Esterwood, PA-C   BP 128/66 mmHg  Pulse 69  Temp(Src) 97.7 F (36.5 C) (Oral)  Resp 18  Ht 5\' 5"  (1.651 m)  Wt 201 lb (91.173 kg)  BMI 33.45 kg/m2  SpO2 100%  LMP 09/11/2015 Physical Exam  Constitutional: She is oriented to person, place, and time. She appears well-developed and well-nourished. No distress.  Sitting in the room comfortably with all the lights on  HENT:  Head: Normocephalic and atraumatic.  Mouth/Throat: Oropharynx is clear and moist.  No facial tenderness.  No nasal discharge no trismus  Eyes: Conjunctivae and EOM are normal. Pupils are equal, round, and reactive to light.  Neck: Normal range of motion. Neck supple. No rigidity. No  Brudzinski's sign and no Kernig's sign noted.  Cardiovascular: Normal rate, regular rhythm and intact distal pulses.   Pulmonary/Chest: Effort normal and breath sounds normal. No respiratory distress. She has no wheezes. She has no rales.  Abdominal: Soft. Bowel sounds are normal. There is no tenderness. There is no rebound and no guarding.  Musculoskeletal: Normal range of motion. She exhibits no edema or tenderness.  Lymphadenopathy:    She has no cervical adenopathy.  Neurological: She is alert and oriented to person, place, and time. She has normal reflexes. She displays normal reflexes. No cranial nerve deficit. She exhibits normal muscle tone. Coordination normal.  Skin: Skin is warm and dry.  Psychiatric: She has a normal mood and affect.    ED Course  Procedures (including critical care time) Labs Review Labs Reviewed  POC URINE PREG, ED    Imaging Review Ct Head Wo Contrast  09/14/2015  CLINICAL DATA:  Chronic headache. Right-sided blurred vision, nausea and anxiety. Initial encounter. EXAM: CT HEAD WITHOUT CONTRAST TECHNIQUE: Contiguous axial images were obtained from the base of the skull through the vertex without intravenous contrast. COMPARISON:  CT of the head performed 06/30/2011 FINDINGS: There is no evidence of acute infarction, mass lesion, or intra- or extra-axial hemorrhage on CT. The posterior fossa, including the cerebellum, brainstem and fourth ventricle, is within normal limits. The third and lateral ventricles, and basal ganglia are unremarkable in appearance. The cerebral hemispheres are symmetric in appearance, with normal gray-white differentiation. No mass effect or midline shift is seen. There is no evidence of fracture; visualized osseous structures are unremarkable in appearance. The orbits are within normal limits. The paranasal sinuses and mastoid air cells are well-aerated. No significant soft tissue abnormalities are seen. IMPRESSION: Unremarkable  noncontrast CT of the head. Electronically Signed   By: Garald Balding M.D.   On: 09/14/2015 06:27   I have personally reviewed and evaluated these images and lab results as part of my medical decision-making.   EKG Interpretation None      MDM   Final diagnoses:  None      Visual Acuity  Right Eye Distance: 20/20 Left Eye Distance: 20/20 Bilateral Distance: 20/20  Right Eye Near:   Left Eye Near:    Bilateral Near:     Results for orders placed or performed during the hospital encounter of 09/14/15  POC Urine Pregnancy, ED (do NOT order at [facility])  Result Value Ref Range   Preg Test, Ur NEGATIVE NEGATIVE   Ct Head Wo Contrast  09/14/2015  CLINICAL DATA:  Chronic headache. Right-sided blurred vision, nausea and anxiety. Initial encounter. EXAM: CT HEAD WITHOUT CONTRAST TECHNIQUE: Contiguous axial images were obtained from the base of the skull through the vertex without intravenous contrast. COMPARISON:  CT of the  head performed 06/30/2011 FINDINGS: There is no evidence of acute infarction, mass lesion, or intra- or extra-axial hemorrhage on CT. The posterior fossa, including the cerebellum, brainstem and fourth ventricle, is within normal limits. The third and lateral ventricles, and basal ganglia are unremarkable in appearance. The cerebral hemispheres are symmetric in appearance, with normal gray-white differentiation. No mass effect or midline shift is seen. There is no evidence of fracture; visualized osseous structures are unremarkable in appearance. The orbits are within normal limits. The paranasal sinuses and mastoid air cells are well-aerated. No significant soft tissue abnormalities are seen. IMPRESSION: Unremarkable noncontrast CT of the head. Electronically Signed   By: Garald Balding M.D.   On: 09/14/2015 06:27    Medications  ketorolac (TORADOL) injection 60 mg (60 mg Intramuscular Given 09/14/15 0608)  metoCLOPramide (REGLAN) injection 10 mg (10 mg Intramuscular  Given 09/14/15 0613)  dexamethasone (DECADRON) injection 10 mg (10 mg Intramuscular Given 09/14/15 0612)   Given patient's allergies this was the optimal migraine cocktail.  Highly doubt acute intracranial pathology. Highly doubt cavernous sinus thrombosis as patient is EOMI and intact speech and cognition.  Stable for discharge at this time will give the referral guide so patient can find a new PMD.      Caide Campi, MD 09/14/15 [phone]

## 2015-09-14 NOTE — Discharge Instructions (Signed)
General Headache Without Cause A headache is pain or discomfort felt around the head or neck area. There are many causes and types of headaches. In some cases, the cause may not be found.  HOME CARE  Managing Pain  Take over-the-counter and prescription medicines only as told by your doctor.  Lie down in a dark, quiet room when you have a headache.  If directed, apply ice to the head and neck area:  Put ice in a plastic bag.  Place a towel between your skin and the bag.  Leave the ice on for 20 minutes, 2-3 times per day.  Use a heating pad or hot shower to apply heat to the head and neck area as told by your doctor.  Keep lights dim if bright lights bother you or make your headaches worse. Eating and Drinking  Eat meals on a regular schedule.  Lessen how much alcohol you drink.  Lessen how much caffeine you drink, or stop drinking caffeine. General Instructions  Keep all follow-up visits as told by your doctor. This is important.  Keep a journal to find out if certain things bring on headaches. For example, write down:  What you eat and drink.  How much sleep you get.  Any change to your diet or medicines.  Relax by getting a massage or doing other relaxing activities.  Lessen stress.  Sit up straight. Do not tighten (tense) your muscles.  Do not use tobacco products. This includes cigarettes, chewing tobacco, or e-cigarettes. If you need help quitting, ask your doctor.  Exercise regularly as told by your doctor.  Get enough sleep. This often means 7-9 hours of sleep. GET HELP IF:  Your symptoms are not helped by medicine.  You have a headache that feels different than the other headaches.  You feel sick to your stomach (nauseous) or you throw up (vomit).  You have a fever. GET HELP RIGHT AWAY IF:   Your headache becomes really bad.  You keep throwing up.  You have a stiff neck.  You have trouble seeing.  You have trouble speaking.  You have  pain in the eye or ear.  Your muscles are weak or you lose muscle control.  You lose your balance or have trouble walking.  You feel like you will pass out (faint) or you pass out.  You have confusion.   This information is not intended to replace advice given to you by your health care provider. Make sure you discuss any questions you have with your health care provider.   Document Released: 08/23/2008 Document Revised: 08/05/2015 Document Reviewed: 03/09/2015 Elsevier Interactive Patient Education 2016 [employer].  Emergency Department Resource Guide 1) Find a Doctor and Pay Out of Pocket Although you won't have to find out who is covered by your insurance plan, it is a good idea to ask around and get recommendations. You will then need to call the office and see if the doctor you have chosen will accept you as a new patient and what types of options they offer for patients who are self-pay. Some doctors offer discounts or will set up payment plans for their patients who do not have insurance, but you will need to ask so you aren't surprised when you get to your appointment.  2) Contact Your Local Health Department Not all health departments have doctors that can see patients for sick visits, but many do, so it is worth a call to see if yours does. If you don't know  where your local health department is, you can check in your phone book. The CDC also has a tool to help you locate your state's health department, and many state websites also have listings of all of their local health departments.  3) Find a [facility] If your illness is not likely to be very severe or complicated, you may want to try a walk in clinic. These are popping up all over the country in pharmacies, drugstores, and shopping centers. They're usually staffed by nurse practitioners or physician assistants that have been trained to treat common illnesses and complaints. They're usually fairly quick and inexpensive.  However, if you have serious medical issues or chronic medical problems, these are probably not your best option.  No Primary Care Doctor: - Call Health Connect at  [phone] - they can help you locate a primary care doctor that  accepts your insurance, provides certain services, etc. - Physician Referral Service- [phone]  Chronic Pain Problems: Organization         Address  Phone   Notes  [facility]  [phone] Patients need to be referred by their primary care doctor.   Medication Assistance: Organization         Address  Phone   Notes  [facility] Medication [facility] [address] [place], [place] [address] [phone] --Must be a resident of [facility] -- Must have NO insurance coverage whatsoever (no Medicaid/ Medicare, etc.) -- The pt. MUST have a primary care doctor that directs their care regularly and follows them in the community   MedAssist  [phone]   [employer]  [phone]    Agencies that provide inexpensive medical care: Organization         Address  Phone   Notes  [place]  [phone]   [facility]    [phone]   [facility] [place], [place] [address] [phone]   [place] [address], [place] [phone]   Planned Parenthood    [phone]   [facility]    [phone]   [facility] and [address], [place] Phone:  [phone], Fax:  [phone] Hours of Operation:  9 am - 6 pm, M-F.  Also accepts Medicaid/Medicare and self-pay.  [facility] for [place], [address], [place] Phone: [phone], Fax: [phone]. Hours of Operation:  8:30 am - 5:30 pm, M-F.  Also accepts Medicaid and self-pay.  [facility] [place] [address] [place] Phone: [phone]   [place], [place], [place] [phone], Ext. [phone] Mondays & Thursdays: 7-9 AM.  First 15 patients are seen on a first come, first serve basis.    [place] Providers:  Organization         Address  Phone   Notes  [facility] [address], [place] [phone] Also accepts self-pay patients.  [place], [place]  [phone]   [place], [address], [place] [phone]   [facility] [address], [place] [phone]   Lucianne Lei [address], [place]   [phone] Only accepts [employer]  patients after they have their name applied to their card.   Self-Pay (no insurance) in [facility]:  Organization         Address  Phone   Notes  Sickle Cell Patients, [facility] [place] [phone]   [facility] [place] [phone]   [facility] [place]  [place], [place], [place] [phone]   [facility]/Dr. Osei-Bonsu  [address] [place] or [address], [place] [phone] Phone number for both [place] and [place] locations is the same.  Urgent Medical and [facility] [address], [place] [phone]   [facility] [address], [place] or [address] [phone]   [facility] [address] [place] [phone], phone; [phone], fax Sees patients 1st and 3rd Saturday of every month.  Must not qualify for public or private insurance (i.e. Medicaid, Medicare, [place] Health Choice, Veterans' Benefits)  Household income should be no more than 200% of the poverty level The clinic cannot treat you if you are pregnant or think  you are pregnant  Sexually transmitted diseases are not treated at the clinic.    Dental Care: Organization         Address  Phone  Notes  [facility] Department of [facility] [place] [phone] Accepts children up to age 41 who are enrolled in [place] or [place]; pregnant women with a Medicaid card; and children who have applied for Medicaid or [place] Health Choice, but were declined, whose parents can pay a reduced fee at time of service.  [facility] Department of [facility]  [address], [place] [phone] Accepts children up to age 32 who are enrolled in [place] or [place]; pregnant women with a Medicaid card; and children who have applied for Medicaid or [place] Health Choice, but were declined, whose parents can pay a reduced fee at time of service.  [facility] Adult Dental Access PROGRAM  [place] [phone] Patients are seen by appointment only. Walk-ins are not accepted. [place] will see patients 37 years of age and older. Monday - Tuesday (8am-5pm) Most Wednesdays (8:30-5pm) $30 per visit, cash only  [facility] Adult Dental Access PROGRAM  [address], [place] [facility] [phone] Patients are seen by appointment only. Walk-ins are not accepted. [place] will see patients 60 years of age and older. One Wednesday Evening (Monthly: Volunteer Based).  $30 per visit, cash only  [facility]  [phone] for adults; Children under age 34, call Graduate Pediatric Dentistry at [phone]. Children aged 99-14, please call [phone] to request a pediatric application.  Dental services are provided in all areas of dental care including fillings, crowns and bridges, complete and partial dentures, implants, gum treatment, root canals, and extractions. Preventive care is also provided. Treatment is provided to both adults and  children. Patients are selected via a lottery and there is often a waiting list.   [facility] [address], [place]  [phone] [email or web address], [place], [place] [phone], Ext. [phone] Second and Fourth Thursday of each month, opens at 6:30 AM; Clinic ends at 9 AM.  Patients are seen on a first-come first-served basis, and a limited number are seen during each clinic.   [facility]  [address] Hillard Danker [place], [place] [phone]   Eligibility Requirements You must have lived in [place], [place], or [place] counties for at least the last three months.   You cannot be eligible for state or federal sponsored [employer], including [employer], [place], or [employer].   You generally cannot be eligible for healthcare insurance through your employer.    How to apply: Eligibility screenings are held every Tuesday and Wednesday afternoon from 1:00 pm until 4:00 pm. You do not need an appointment for the interview!  [facility] [address], [place], [place]   [place]  [place] Department  [place]  [phone]    Behavioral Health Resources in the Community: Intensive Outpatient Programs Organization         Address  Phone  Notes  [facility]. [address] [place], [place] [phone]   [facility] Outpatient [address], [place], [place]   ADS: Alcohol & Drug Svcs [address] [place], [place]   [place] [address],  [place], [place] or [phone]   Substance Abuse Resources Organization         Address  Phone  Notes  Alcohol and Drug Services  [phone]   [place]  [phone]   [facility] [place]     Chinita Pester  [phone]   Residential & Outpatient Substance Abuse Program  [phone]   Psychological Services Organization         Address  Phone  Notes  [facility] [employer]  [place]  [phone]   [place] [address] [place] or [phone]    Mobile Crisis Teams Organization         Address  Phone  Notes  [facility], Mobile Crisis Care Unit  [phone]   [facility] Psychotherapeutic Services  [address] [place], [place]   [place] [address] [place] [phone]    Self-Help/Support Groups Organization         Address  Phone             Notes  [place]. of [place] - variety of support groups  [place] Call for more information  Narcotics Anonymous (NA), [facility] [address], [employer] [place]  2 meetings at this location   Special educational needs teacher         Address  Phone  Notes  [facility]AP Residential Treatment [place],    [place]  [phone]   [facility]  [address] [place] [address], [place], [place]   [place], [place] [phone] Admissions: 8am-3pm M-F  Incentives Substance [place] [address],    [place], [place] [phone]   [facility] [address] Jadene Pierini [place], [place]   [facility] [address],  [place], [place]   Insight Programs - Intensive Outpatient [address], [place], [place]   [facility] [place].,  [place], [place] or [phone]   [facility]idential Treatment Services (RTS) [address] [facility] [place], [place] Accepts Medicaid  Fellowship Time [address]  [place] [phone] Substance Abuse/Addiction Treatment   [facility] Organization         Address  Phone  Notes  [facility]  [phone]   Domenic Schwab, PhD [address] Arlis Porta [place], [place]   [phone] or [phone]   [place], [place] [phone]   [place] [address], [place], [place] [phone] Insurance/Medicaid/sponsorship through [facility] and Families [address]                                    [place], [place] [phone] [place] [address], [place] [phone]    Dr. Adele Schilder  [phone]   [facility] [facility] Dept. 1) [address] [place] 2) [place] 3)  [place] [address], [place] [phone]  [phone]   [place] [phone] or [phone] (After Hours)

## 2015-09-14 NOTE — ED Notes (Signed)
Pt presents w/ multiple c/o including; HA, nausea, blurred vision in right eye and anxiety.  Pt has had these sx for several months.

## 2015-09-16 ENCOUNTER — Encounter: Payer: Self-pay | Admitting: Cardiovascular Disease

## 2015-09-16 ENCOUNTER — Ambulatory Visit (INDEPENDENT_AMBULATORY_CARE_PROVIDER_SITE_OTHER): Payer: BLUE CROSS/BLUE SHIELD

## 2015-09-16 ENCOUNTER — Telehealth: Payer: Self-pay | Admitting: Family

## 2015-09-16 ENCOUNTER — Ambulatory Visit (INDEPENDENT_AMBULATORY_CARE_PROVIDER_SITE_OTHER): Payer: BLUE CROSS/BLUE SHIELD | Admitting: Cardiovascular Disease

## 2015-09-16 VITALS — BP 120/80 | HR 72 | Ht 65.5 in | Wt 205.9 lb

## 2015-09-16 DIAGNOSIS — R0683 Snoring: Secondary | ICD-10-CM

## 2015-09-16 DIAGNOSIS — R002 Palpitations: Secondary | ICD-10-CM

## 2015-09-16 DIAGNOSIS — R079 Chest pain, unspecified: Secondary | ICD-10-CM | POA: Diagnosis not present

## 2015-09-16 DIAGNOSIS — R0681 Apnea, not elsewhere classified: Secondary | ICD-10-CM

## 2015-09-16 DIAGNOSIS — R5383 Other fatigue: Secondary | ICD-10-CM

## 2015-09-16 NOTE — Progress Notes (Signed)
Cardiology Office Note   Date:  09/16/2015   ID:  Stacy [name], DOB [birth year], MRN [id number]  PCP:  Stacy [name]., NP  Cardiologist:   Stacy Harness, MD   Chief Complaint  Patient presents with  . Chest Pain  . Shortness of Breath  . Palpitations      History of Present Illness: Stacy [name] is a 44 y.o. female with hypertension, obesity, anxiety and depression who presents for an evaluation of chest pain and shortness of breath.  She was evaluated in the ED on 9/9 for chest pain and shortness of breath.  Cardiac enzymes and EKG were unremarkable.  LE ultrasound was negative for DVT one week prior to her ED visit.  Stacy [name] reports that she has been feeling poorly for a while now.  For the last year she has noted intermittent palpitations and shortness of breath. The episodes occur 3-4 times per week and on days when they occur they happen several times throughout the day. They're associated with lightheadedness and dizziness and last for a few seconds at a time. She denies chest pain, nausea or diaphoresis when this occurs. The symptoms typically occur at rest and not with exertion.  [name] reports 3-4 months of significant fatigue. She is very tired halfway through her day at work and also with carrying groceries or doing light housework. She does not get any exercise. She denies chest pain with exertion. She also notes pain in bilateral shoulders radiates down into her arms. There is also associated numbness to her elbows. She endorses occasional lower extremity edema the right leg. She did undergo ultrasound testing that was negative for DVT.   She reports seeing a cardiologist over one year ago.  She had a treadmill stress test that were reportedly normal.  She was told that she probably had normal skipped beats but did not have any ambulatory monitoring.   2 months of difficulty staying asleep.  Her daughter notes that she  stops breathing overnight and this Pain-Scheer reports sometimes waking up gasping for air.  She has acid reflux and sometimes can't differentiate that from her heart.   Of note, her mother has heart disease and recently had a heart cath.  She does not know was found at the time of cardiac catheterization.  Past Medical History  Diagnosis Date  . Esophageal stricture   . Chest pain 09/14/2009    no pulmonary embolus by chest CT  . Palpitations   . Elevated BP   . Anxiety     past Hx  . Depression     past Hx  . GERD (gastroesophageal reflux disease)     no meds  . Sickle cell trait ([place])   . Fibroid   . Abnormal Pap smear     cryo, normal since    Past Surgical History  Procedure Laterality Date  . Cesarean section  2004  . Laparoscopy for ectopic pregnancy  2001  . Wisdom tooth extraction  2012     Current Outpatient Prescriptions  Medication Sig Dispense Refill  . acetaminophen (TYLENOL) 325 MG tablet Take 650 mg by mouth every 6 (six) hours as needed for mild pain.    . clonazePAM (KLONOPIN) 0.5 MG tablet Take 0.5 mg by mouth daily as needed. (ANXIETY)  0  . famotidine (PEPCID) 20 MG tablet Take 20 mg by mouth at bedtime.     No current facility-administered medications for this visit.    Allergies:  Diphenhydramine hcl; Hydromorphone hcl; and Morphine    Social History:  The patient  reports that she has never smoked. She has never used smokeless tobacco. She reports that she drinks alcohol. She reports that she does not use illicit drugs.   Family History:  The patient's family history includes Asthma in her daughter, maternal grandmother, and mother; Hypertension in her mother and paternal uncle. There is no history of Colon cancer, Esophageal cancer, or Stomach cancer.    ROS:  Please see the history of present illness.   Otherwise, review of systems are positive for none.   All other systems are reviewed and negative.    PHYSICAL EXAM: VS:  BP 120/80  mmHg  Pulse 72  Ht 5' 5.5" (1.664 m)  Wt 93.396 kg (205 lb 14.4 oz)  BMI 33.73 kg/m2  SpO2 98%  LMP 09/11/2015 , BMI Body mass index is 33.73 kg/(m^2). GENERAL:  Well appearing HEENT:  Pupils equal round and reactive, fundi not visualized, oral mucosa unremarkable NECK:  No jugular venous distention, waveform within normal limits, carotid upstroke brisk and symmetric, no bruits, no thyromegaly LYMPHATICS:  No cervical adenopathy LUNGS:  Clear to auscultation bilaterally HEART:  RRR.  PMI not displaced or sustained,S1 and S2 within normal limits, no S3, no S4, no clicks, no rubs, no murmurs ABD:  Flat, positive bowel sounds normal in frequency in pitch, no bruits, no rebound, no guarding, no midline pulsatile mass, no hepatomegaly, no splenomegaly EXT:  2 plus pulses throughout, no edema, no cyanosis no clubbing SKIN:  No rashes no nodules NEURO:  Cranial nerves II through XII grossly intact, motor grossly intact throughout PSYCH:  Cognitively intact, oriented to person place and time    EKG:  EKG is not ordered today. The ekg ordered 08/10/15 demonstrates sinus rhythm rate 75 bpm.  Incomplete RBBB.     Recent Labs: 04/17/2015: TSH 1.57 08/07/2015: B Natriuretic Peptide 27.2; Hemoglobin 12.0; Platelets 353 08/13/2015: ALT 10; BUN 8; Creatinine, Ser 0.58; Potassium 3.8; Sodium 136    Lipid Panel    Component Value Date/Time   CHOL 178 09/28/2009 2307   TRIG 62 09/28/2009 2307   HDL 53 09/28/2009 2307   CHOLHDL 3.4 Ratio 09/28/2009 2307   VLDL 12 09/28/2009 2307   LDLCALC 113* 09/28/2009 2307      Wt Readings from Last 3 Encounters:  09/16/15 93.396 kg (205 lb 14.4 oz)  09/14/15 91.173 kg (201 lb)  08/24/15 92.08 kg (203 lb)      ASSESSMENT AND PLAN:  # Chest/arm pain: Symptoms are atypical and more likely to be neuropathic, given the numbness of both arms.  We discussed that this could be from c-spine disease from working at her computer for extended periods of time.  We  will obtain a treadmill stress test to rule out obstructive coronary disease.  # Palpitations: Likely PAC/PVCs.  However, we will obtain a 7 day event monitor to ensure there is no more malignant arrhythmias. She had a normal TSH and is not anemic. Electrolytes were within normal limits in September. Therefore we will not repeat this lab work at this time.  # Fatigue/shortness of breath: It is unclear what causing StacyPayne-Stingley' fatigue.  My suspicion is that it is unrelated to her heart. She has no signs of heart failure on exam and BNP was normal.. We are assessing for arrhythmia and obstructive coronary disease as above. Her ESR was elevated though her ANA was normal. I recommended that she follow-up with her  primary care physician for consideration of referral to rheumatology. I'm suspicious that there may be an autoimmune component to her symptoms.  D-dimer was elevated but she had a negative LE ultrasound.    # Apnea: Ms. Stolze reports episodes of apnea and daytime fatigue.  We will refer her for a sleep study to ensure that she does not have obstructive sleep apnea.  Current medicines are reviewed at length with the patient today.  The patient does not have concerns regarding medicines.  The following changes have been made:  no change  Labs/ tests ordered today include:   Orders Placed This Encounter  Procedures  . Exercise Tolerance Test  . Cardiac event monitor  . Split night study     Disposition:   FU with Walter Grima C. Oval Linsey, MD as needed.    Signed, Stacy Harness, MD  09/16/2015 12:14 PM    [place]

## 2015-09-16 NOTE — Patient Instructions (Addendum)
Your physician has recommended that you wear an event monitor. Event monitors are medical devices that record the heart's electrical activity. Doctors most often [place] these monitors to diagnose arrhythmias. Arrhythmias are problems with the speed or rhythm of the heartbeat. The monitor is a small, portable device. You can wear one while you do your normal daily activities. This is usually used to diagnose what is causing palpitations/syncope (passing out).  Your physician has requested that you have an exercise tolerance test. For further information please visit [email or web address]. Please also follow instruction sheet, as given.  Your physician has recommended that you have a sleep study. This test records several body functions during sleep, including: brain activity, eye movement, oxygen and carbon dioxide blood levels, heart rate and rhythm, breathing rate and rhythm, the flow of air through your mouth and nose, snoring, body muscle movements, and chest and belly movement.  Dr Oval Linsey recommends that you follow-up with her as needed.  Your Doctor has ordered you to wear a heart monitor. You will wear this for 7 days.   TIPS -  REMINDERS 1. The sensor is the lanyard that is worn around your neck every day - this is powered by a battery that needs to be changed every day 2. The monitor is the device that allows you to record symptoms - this will need to be charged daily 3. The sensor & monitor need to be within 100 feet of each other at all times 4. The sensor connects to the electrodes (stickers) - these should be changed every 24-48 hours (you do not have to remove them when you bathe, just make sure they are dry when you connect it back to the sensor 5. If you need more supplies (electrodes, batteries), please call the 1-800 # on the back of the pamphlet and [employer] will mail you more supplies 6. If your skin becomes sensitive, please try the sample pack of sensitive skin electrodes (the white  packet in your silver box) and call [employer] to have them mail you more of these type of electrodes 7. When you are finish wearing the monitor, please place all supplies back in the silver box, place the silver box in the pre-packaged UPS bag and drop off at [employer] or call them so they can come pick it up   Cardiac Event Monitoring A cardiac event monitor is a small recording device used to help detect abnormal heart rhythms (arrhythmias). The monitor is used to record heart rhythm when noticeable symptoms such as the following occur:  Fast heartbeats (palpitations), such as heart racing or fluttering.  Dizziness.  Fainting or light-headedness.  Unexplained weakness. The monitor is wired to two electrodes placed on your chest. Electrodes are flat, sticky disks that attach to your skin. The monitor can be worn for up to 30 days. You will wear the monitor at all times, except when bathing.  HOW TO USE YOUR CARDIAC EVENT MONITOR A technician will prepare your chest for the electrode placement. The technician will show you how to place the electrodes, how to work the monitor, and how to replace the batteries. Take time to practice using the monitor before you leave the office. Make sure you understand how to send the information from the monitor to your health care provider. This requires a telephone with a landline, not a cell phone. You need to:  Wear your monitor at all times, except when you are in water:  Do not get the monitor wet.  Take the  monitor off when bathing. Do not swim or use a hot tub with it on.  Keep your skin clean. Do not put body lotion or moisturizer on your chest.  Change the electrodes daily or any time they stop sticking to your skin. You might need to use tape to keep them on.  It is possible that your skin under the electrodes could become irritated. To keep this from happening, try to put the electrodes in slightly different places on your chest. However, they must  remain in the area under your left breast and in the upper right section of your chest.  Make sure the monitor is safely clipped to your clothing or in a location close to your body that your health care provider recommends.  Press the button to record when you feel symptoms of heart trouble, such as dizziness, weakness, light-headedness, palpitations, thumping, shortness of breath, unexplained weakness, or a fluttering or racing heart. The monitor is always on and records what happened slightly before you pressed the button, so do not worry about being too late to get good information.  Keep a diary of your activities, such as walking, doing chores, and taking medicine. It is especially important to note what you were doing when you pushed the button to record your symptoms. This will help your health care provider determine what might be contributing to your symptoms. The information stored in your monitor will be reviewed by your health care provider alongside your diary entries.  Send the recorded information as recommended by your health care provider. It is important to understand that it will take some time for your health care provider to process the results.  Change the batteries as recommended by your health care provider. SEEK IMMEDIATE MEDICAL CARE IF:   You have chest pain.  You have extreme difficulty breathing or shortness of breath.  You develop a very fast heartbeat that persists.  You develop dizziness that does not go away.  You faint or constantly feel you are about to faint. Document Released: 08/23/2008 Document Revised: 03/31/2014 Document Reviewed: 05/13/2013 [facility] Patient Information 2015 [place], [place]. This information is not intended to replace advice given to you by your health care provider. Make sure you discuss any questions you have with your health care provider.

## 2015-09-16 NOTE — Telephone Encounter (Signed)
Pt called for emergency room follow up. Checked with Melissa and she advised pt was seen in the ER for migraines and did not consider emergency. She advised me to notify pt letter was mailed 08/27/15 dismissing her from the practice for multiple missed visits. I informed the pt and she said that she will try to find another provider and she will be following up with our office manager.

## 2015-09-18 ENCOUNTER — Ambulatory Visit: Payer: BLUE CROSS/BLUE SHIELD | Admitting: Psychology

## 2015-09-20 ENCOUNTER — Ambulatory Visit (HOSPITAL_BASED_OUTPATIENT_CLINIC_OR_DEPARTMENT_OTHER): Payer: BLUE CROSS/BLUE SHIELD | Attending: Cardiovascular Disease | Admitting: Radiology

## 2015-09-20 VITALS — Ht 66.0 in | Wt 200.0 lb

## 2015-09-20 DIAGNOSIS — G4719 Other hypersomnia: Secondary | ICD-10-CM | POA: Insufficient documentation

## 2015-09-20 DIAGNOSIS — R0681 Apnea, not elsewhere classified: Secondary | ICD-10-CM

## 2015-09-20 DIAGNOSIS — R5383 Other fatigue: Secondary | ICD-10-CM

## 2015-09-20 DIAGNOSIS — R0683 Snoring: Secondary | ICD-10-CM | POA: Insufficient documentation

## 2015-09-20 DIAGNOSIS — G4733 Obstructive sleep apnea (adult) (pediatric): Secondary | ICD-10-CM | POA: Diagnosis present

## 2015-09-21 ENCOUNTER — Telehealth: Payer: Self-pay | Admitting: Cardiology

## 2015-09-21 ENCOUNTER — Telehealth: Payer: Self-pay | Admitting: Neurology

## 2015-09-21 NOTE — Sleep Study (Signed)
    Patient Name: Stacy, [name] MRN: [id number] Study Date: 09/20/2015 Gender: Female D.O.B: 02/08/1971 Age (years): 5 Referring Provider: Skeet Latch Interpreting Physician: Fransico Him MD, ABSM RPSGT: Carolin Coy  Weight (lbs): 200 Height (inches): 65 BMI: 33 Neck Size: 13.00  CLINICAL INFORMATION Sleep Study Type: NPSG Indication for sleep study: Fatigue, OSA, Snoring Epworth Sleepiness Score: 11  SLEEP STUDY TECHNIQUE As per the AASM Manual for the Scoring of Sleep and Associated Events v2.3 (April 2016) with a hypopnea requiring 4% desaturations. The channels recorded and monitored were frontal, central and occipital EEG, electrooculogram (EOG), submentalis EMG (chin), nasal and oral airflow, thoracic and abdominal wall motion, anterior tibialis EMG, snore microphone, electrocardiogram, and pulse oximetry.  MEDICATIONS Patient's medications include: Klonopin, Pepcid. Medications self-administered by patient during sleep study : Sleep medicine administered - Unspecified at 11:00:00 PM  SLEEP ARCHITECTURE The study was initiated at 11:28:28 PM and ended at 5:46:54 AM. Sleep onset time was prolonged at 38.4 minutes and the sleep efficiency was mildly reduced at 80.1%. The total sleep time was 303.0 minutes. Stage REM latency was prolonged at 185.0 minutes. The patient spent 10.56% of the night in stage N1 sleep, 75.58% in stage N2 sleep, 0.00% in stage N3 and 13.86% in REM. Alpha intrusion was absent. Supine sleep was 76.57%.  RESPIRATORY PARAMETERS The overall apnea/hypopnea index (AHI) was 0.2 per hour. There were 1 total apneas, including 0 obstructive, 1 central and 0 mixed apneas. There were 0 hypopneas and 19 RERAs. The AHI during Stage REM sleep was 1.4 per hour. AHI while supine was 0.3 per hour. The mean oxygen saturation was 97.93%. The minimum SpO2 during sleep was 95.00%. Soft snoring was noted during this study.  CARDIAC DATA The 2  lead EKG demonstrated sinus rhythm. The mean heart rate was 69.75 beats per minute. Other EKG findings include: None.  LEG MOVEMENT DATA The total PLMS were 0 with a resulting PLMS index of 0.00. Associated arousal with leg movement index was 0.0 .  IMPRESSIONS - No significant obstructive sleep apnea occurred during this study (AHI = 0.2/h). - No significant central sleep apnea occurred during this study (CAI = 0.2/h). - The patient had minimal or no oxygen desaturation during the study (Min O2 = 95.00%) - The patient snored with Soft snoring volume. - No cardiac abnormalities were noted during this study. - Clinically significant periodic limb movements did not occur during sleep. No significant associated arousals.  DIAGNOSIS - Excessive Daytime Sleepiness  RECOMMENDATIONS - Avoid alcohol, sedatives and other CNS depressants that may results in sleep apnea and disrupt normal sleep architecture. - Sleep hygiene should be reviewed to assess factors that may improve sleep quality. - Weight management and regular exercise should be initiated or continued if appropriate.   [place], American Board of Sleep Medicine  ELECTRONICALLY SIGNED ON:  09/21/2015, 11:01 AM [place] PH: [phone]   FX: [phone] [place]

## 2015-09-21 NOTE — Telephone Encounter (Signed)
Called by urgent care to discuss Stacy [name] symptoms including fatigue and nonspecific visual changes. Etiology is uncertain. Although the patient is concerned about MS,  this would be relatively unlikely without more specific neurologic findings. If he would like,  she can be referred to the office for neurologic consultation by one of the neurologists.

## 2015-09-21 NOTE — Telephone Encounter (Signed)
Please let patient know that sleep study showed no significant sleep apnea.    

## 2015-09-22 ENCOUNTER — Ambulatory Visit (INDEPENDENT_AMBULATORY_CARE_PROVIDER_SITE_OTHER): Payer: BLUE CROSS/BLUE SHIELD | Admitting: Neurology

## 2015-09-22 ENCOUNTER — Encounter: Payer: Self-pay | Admitting: Neurology

## 2015-09-22 VITALS — BP 130/90 | HR 76 | Resp 16 | Ht 66.0 in | Wt 200.2 lb

## 2015-09-22 DIAGNOSIS — M2550 Pain in unspecified joint: Secondary | ICD-10-CM

## 2015-09-22 DIAGNOSIS — F418 Other specified anxiety disorders: Secondary | ICD-10-CM | POA: Diagnosis not present

## 2015-09-22 DIAGNOSIS — R5383 Other fatigue: Secondary | ICD-10-CM

## 2015-09-22 DIAGNOSIS — H539 Unspecified visual disturbance: Secondary | ICD-10-CM | POA: Diagnosis not present

## 2015-09-22 DIAGNOSIS — R200 Anesthesia of skin: Secondary | ICD-10-CM

## 2015-09-22 MED ORDER — ESCITALOPRAM OXALATE 10 MG PO TABS: 10.0000 mg | ORAL_TABLET | Freq: Every day | ORAL | Status: DC

## 2015-09-22 NOTE — Progress Notes (Signed)
[facility]  PATIENT: Stacy [name] DOB: 05/20/1971  REFERRING DOCTOR OR PCP:  Debbrah Alar SOURCE: patient and records in EMR  _________________________________   HISTORICAL  CHIEF COMPLAINT:  Chief Complaint  Patient presents with  . Generalized Pain    Sts. onset several mos. ago of generalized pain, worse in legs, shoulders, neck.  Also c/o fatigue, numbness in legs, esp. with sitting.  "I feel very sick and tired."  Sts. onset 09-03-15 of blurry vision left eye.  Sts. increased h/a's over the last 2 weeks.  She is currently wearing a 7 day event monitor/fim  . Numbness    HISTORY OF PRESENT ILLNESS:  I had the pleasure of seeing your patient, Stacy [name], at [facility] for a consultation regarding joint pain, headaches, numbness and visual changes.   She reports that she just hasn't been feeling well the past few months.     This summer, she began to experience pain in her shoulders and other joints.   She also had pain in the left leg. The right shoulder also has a fair amount of pain.   Although she feels the shoulder is weaker, she has not noted change in ROM.   Her ESR was elevated (42) but ANA and CRP are normal.  On 09/03/15, she woke up with visual blurring in the right eye.   She has felt more tired and weak the past month, as well. The vision changes have npt changed over the last 2 weeks.   Because of chest pain last month and loss of energy, she saw cardiology.   She has a Holter monitor and is going to have an Echocardiogram in a couple weeks.   She has felt much more fatigued the past few months. His physical more than cognitive. She notes that her sleep can be variable with insomnia at times.  She denies any difficulty with gait.    She feels her right side is weaker and she has noted dropping more items.     She also reports frequent bouts of intermittent numbness in her legs.   This occurs most frequently when  sitting.   The numbness usually lasts 5-10 minutes  She has not noted any change in bladder.  She feels more stressed over the past few months but feels the health symptoms preceded the stress.    She feels more anxious and depressed the past 3 months.           REVIEW OF SYSTEMS: Constitutional: No fevers, chills, sweats, or change in appetite.  She notes fatigue. Eyes: She has noted some visual changes in the right eye.   No double vision or eye pain Ear, nose and throat: No hearing loss, ear pain, nasal congestion, sore throat Cardiovascular: No chest pain, palpitations Respiratory: No shortness of breath at rest or with exertion.   No wheezes GastrointestinaI: No nausea, vomiting, diarrhea, abdominal pain, fecal incontinence Genitourinary: No dysuria, urinary retention or frequency.  No nocturia. Musculoskeletal: as above Integumentary: No rash, pruritus, skin lesions Neurological: as above Psychiatric: Notes some depression and anxiety Endocrine: No palpitations, diaphoresis, change in appetite, change in weigh or increased thirst Hematologic/Lymphatic: No anemia, purpura, petechiae. Allergic/Immunologic: No itchy/runny eyes, nasal congestion, recent allergic reactions, rashes  ALLERGIES: Allergies  Allergen Reactions  . Diphenhydramine Hcl Nausea Only  . Hydromorphone Hcl Nausea And Vomiting  . Morphine Nausea And Vomiting    HOME MEDICATIONS:  Current outpatient prescriptions:  .  acetaminophen (TYLENOL) 325 MG  tablet, Take 650 mg by mouth every 6 (six) hours as needed for mild pain., Disp: , Rfl:  .  clonazePAM (KLONOPIN) 0.5 MG tablet, Take 0.5 mg by mouth daily as needed. (ANXIETY), Disp: , Rfl: 0 .  famotidine (PEPCID) 20 MG tablet, Take 20 mg by mouth at bedtime., Disp: , Rfl:   PAST MEDICAL HISTORY: Past Medical History  Diagnosis Date  . Esophageal stricture   . Chest pain 09/14/2009    no pulmonary embolus by chest CT  . Palpitations   . Elevated BP     . Anxiety     past Hx  . Depression     past Hx  . GERD (gastroesophageal reflux disease)     no meds  . Sickle cell trait ([place])   . Fibroid   . Abnormal Pap smear     cryo, normal since    PAST SURGICAL HISTORY: Past Surgical History  Procedure Laterality Date  . Cesarean section  2004  . Laparoscopy for ectopic pregnancy  2001  . Wisdom tooth extraction  2012    FAMILY HISTORY: Family History  Problem Relation Age of Onset  . Asthma Mother   . Hypertension Mother   . Hypertension Paternal Uncle   . Asthma Maternal Grandmother   . Colon cancer Neg Hx   . Esophageal cancer Neg Hx   . Stomach cancer Neg Hx   . Asthma Daughter   . Thyroid disease Mother   . Heart Problems Mother     SOCIAL HISTORY:  Social History   Social History  . Marital Status: Legally Separated    Spouse Name: N/A  . Number of Children: 2  . Years of Education: N/A   Occupational History  .  [employer]   Social History Main Topics  . Smoking status: Never Smoker   . Smokeless tobacco: Never Used  . Alcohol Use: 0.0 oz/week    0 Standard drinks or equivalent per week     Comment: social  . Drug Use: No  . Sexual Activity: No   Other Topics Concern  . Not on file   Social History Narrative   Epworth Sleepiness Scale = 5 (as of 09/16/2015)     PHYSICAL EXAM  Filed Vitals:   09/22/15 0959  BP: 130/90  Pulse: 76  Resp: 16  Height: 5' 6"  (1.676 m)  Weight: 200 lb 3.2 oz (90.81 kg)    Body mass index is 32.33 kg/(m^2).   General: The patient is well-developed and well-nourished and in no acute distress  Eyes:  Funduscopic exam shows normal optic discs and retinal vessels.   VA was 20/30 OS and OD  Neck: The neck is supple, no carotid bruits are noted.  The neck is nontender.  Cardiovascular: The heart has a regular rate and rhythm with a normal S1 and S2. There were no murmurs, gallops or rubs. Lungs are clear to auscultation.  Skin: Extremities are without  significant edema.  Musculoskeletal:  She is tender over the subacromial bursae, AC joints bilaterally in the shoulders.   Good passive ROM.   Tender in some classic Fibromyalgia tender points.  Back is mildly tender.     Neurologic Exam  Mental status: The patient is alert and oriented x 3 at the time of the examination. The patient has apparent normal recent and remote memory, with an apparently normal attention span and concentration ability.   Speech is normal.  Cranial nerves: Extraocular movements are full. Pupils are equal,  round, and reactive to light and accomodation.  Color vision is symmetric.  Visual fields are full.  Facial symmetry is present. There is good facial sensation to soft touch bilaterally.Facial strength is normal.  Trapezius and sternocleidomastoid strength is normal. No dysarthria is noted.  The tongue is midline, and the patient has symmetric elevation of the soft palate. No obvious hearing deficits are noted.  Motor:  Muscle bulk is normal.   Tone is normal. Strength is  5 / 5 in all 4 extremities.   Sensory: Sensory testing is intact to pinprick, soft touch and vibration sensation in all 4 extremities.  Coordination: Cerebellar testing reveals good finger-nose-finger and heel-to-shin bilaterally.  Gait and station: Station is normal.   Gait is normal. Tandem gait is normal. Romberg is negative.   Reflexes: Deep tendon reflexes are symmetric and normal bilaterally.   Plantar responses are flexor.    DIAGNOSTIC DATA (LABS, IMAGING, TESTING) - I reviewed patient records, labs, notes, testing and imaging myself where available.  Lab Results  Component Value Date   WBC 5.3 08/07/2015   HGB 12.0 08/07/2015   HCT 34.8* 08/07/2015   MCV 83.1 08/07/2015   PLT 353 08/07/2015      Component Value Date/Time   NA 136 08/13/2015 1357   K 3.8 08/13/2015 1357   CL 103 08/13/2015 1357   CO2 27 08/13/2015 1357   GLUCOSE 81 08/13/2015 1357   BUN 8 08/13/2015 1357    CREATININE 0.58 08/13/2015 1357   CREATININE 0.75 11/04/2013 1500   CALCIUM 9.1 08/13/2015 1357   PROT 7.7 08/13/2015 1357   ALBUMIN 3.9 08/13/2015 1357   AST 13 08/13/2015 1357   ALT 10 08/13/2015 1357   ALKPHOS 47 08/13/2015 1357   BILITOT 0.4 08/13/2015 1357   GFRNONAA >60 08/07/2015 1230   GFRAA >60 08/07/2015 1230   Lab Results  Component Value Date   CHOL 178 09/28/2009   HDL 53 09/28/2009   LDLCALC 113* 09/28/2009   TRIG 62 09/28/2009   CHOLHDL 3.4 Ratio 09/28/2009   Lab Results  Component Value Date   HGBA1C 5.3 05/24/2010   No results found for: VITAMINB12 Lab Results  Component Value Date   TSH 1.57 04/17/2015       ASSESSMENT AND PLAN  Other fatigue - Plan: Sedimentation rate, C-reactive protein, TSH  Pain, joint, multiple sites - Plan: Sedimentation rate, C-reactive protein, TSH  Visual disturbance - Plan: Sedimentation rate, C-reactive protein, TSH, MR Brain W Wo Contrast, Visual evoked potential test  Depression with anxiety - Plan: Sedimentation rate, C-reactive protein, TSH  Numbness - Plan: Sedimentation rate, C-reactive protein, TSH, MR Brain W Wo Contrast   In summary, Stacy [name] is a 44 year old woman who has had multiple neurologic and nonneurologic complaints over the past few months the etiology is unclear. She does have some depression and anxiety but I am uncertain if this can solely explain all of her symptoms. Because of the visual disturbances occurring one eye, there is a possibility of mild optic neuritis. Therefore, we will check a visual evoked potential test and an MRI of the brain with and without contrast if both tests are normal, multiple sclerosis would be very unlikely. Additionally, because of her fatigue, I will check some blood tests and re-test the sedimentation rate.   As the depression and anxiety may be playing some role I have started her on Lexapro.    We will call her back after her studies are done to see how  she is  doing and I will see her back in about 2 or 3 months. She should call sooner if she feels that she is worsening.   Richard A. Felecia Shelling, MD, PhD 58/30/9407, 68:08 AM Certified in Neurology, Clinical Neurophysiology, Sleep Medicine, Pain Medicine and Neuroimaging  [facility] [address], [place], [place] [address] [phone]

## 2015-09-23 LAB — SEDIMENTATION RATE: SED RATE: 21 mm/h (ref 0–32)

## 2015-09-23 LAB — C-REACTIVE PROTEIN: CRP: 6.3 mg/L — AB (ref 0.0–4.9)

## 2015-09-23 LAB — TSH: TSH: 1.01 u[IU]/mL (ref 0.450–4.500)

## 2015-09-24 ENCOUNTER — Encounter (HOSPITAL_COMMUNITY): Payer: Self-pay | Admitting: Family Medicine

## 2015-09-24 ENCOUNTER — Emergency Department (HOSPITAL_COMMUNITY)
Admission: EM | Admit: 2015-09-24 | Discharge: 2015-09-24 | Disposition: A | Discharge status: Home / Self Care | Payer: BLUE CROSS/BLUE SHIELD | Attending: Emergency Medicine | Admitting: Emergency Medicine

## 2015-09-24 ENCOUNTER — Ambulatory Visit (INDEPENDENT_AMBULATORY_CARE_PROVIDER_SITE_OTHER): Payer: BLUE CROSS/BLUE SHIELD | Admitting: Obstetrics & Gynecology

## 2015-09-24 ENCOUNTER — Encounter: Payer: Self-pay | Admitting: Obstetrics & Gynecology

## 2015-09-24 VITALS — BP 115/77 | HR 101 | Resp 16 | Ht 66.0 in | Wt 202.0 lb

## 2015-09-24 DIAGNOSIS — G43909 Migraine, unspecified, not intractable, without status migrainosus: Secondary | ICD-10-CM | POA: Diagnosis not present

## 2015-09-24 DIAGNOSIS — N926 Irregular menstruation, unspecified: Secondary | ICD-10-CM

## 2015-09-24 DIAGNOSIS — R102 Pelvic and perineal pain: Secondary | ICD-10-CM

## 2015-09-24 DIAGNOSIS — Z79899 Other long term (current) drug therapy: Secondary | ICD-10-CM | POA: Insufficient documentation

## 2015-09-24 DIAGNOSIS — Z862 Personal history of diseases of the blood and blood-forming organs and certain disorders involving the immune mechanism: Secondary | ICD-10-CM | POA: Insufficient documentation

## 2015-09-24 DIAGNOSIS — N898 Other specified noninflammatory disorders of vagina: Secondary | ICD-10-CM | POA: Diagnosis not present

## 2015-09-24 DIAGNOSIS — D219 Benign neoplasm of connective and other soft tissue, unspecified: Secondary | ICD-10-CM | POA: Insufficient documentation

## 2015-09-24 DIAGNOSIS — Z113 Encounter for screening for infections with a predominantly sexual mode of transmission: Secondary | ICD-10-CM | POA: Diagnosis not present

## 2015-09-24 DIAGNOSIS — R51 Headache: Secondary | ICD-10-CM | POA: Diagnosis present

## 2015-09-24 DIAGNOSIS — D259 Leiomyoma of uterus, unspecified: Secondary | ICD-10-CM

## 2015-09-24 DIAGNOSIS — Z86018 Personal history of other benign neoplasm: Secondary | ICD-10-CM | POA: Insufficient documentation

## 2015-09-24 DIAGNOSIS — J3489 Other specified disorders of nose and nasal sinuses: Secondary | ICD-10-CM | POA: Diagnosis not present

## 2015-09-24 DIAGNOSIS — K219 Gastro-esophageal reflux disease without esophagitis: Secondary | ICD-10-CM | POA: Insufficient documentation

## 2015-09-24 DIAGNOSIS — F419 Anxiety disorder, unspecified: Secondary | ICD-10-CM | POA: Insufficient documentation

## 2015-09-24 DIAGNOSIS — F329 Major depressive disorder, single episode, unspecified: Secondary | ICD-10-CM | POA: Insufficient documentation

## 2015-09-24 HISTORY — DX: Benign neoplasm of connective and other soft tissue, unspecified: D21.9

## 2015-09-24 MED ORDER — AMOXICILLIN-POT CLAVULANATE 875-125 MG PO TABS: 1.0000 | ORAL_TABLET | Freq: Two times a day (BID) | ORAL | Status: DC

## 2015-09-24 MED ORDER — METOCLOPRAMIDE HCL 5 MG/ML IJ SOLN
10.0000 mg | Freq: Once | INTRAMUSCULAR | Status: AC
  Administered 2015-09-24: 10 mg via INTRAVENOUS
  Filled 2015-09-24: qty 2

## 2015-09-24 MED ORDER — KETOROLAC TROMETHAMINE 30 MG/ML IJ SOLN
30.0000 mg | Freq: Once | INTRAMUSCULAR | Status: AC
  Administered 2015-09-24: 30 mg via INTRAVENOUS
  Filled 2015-09-24: qty 1

## 2015-09-24 MED ORDER — SODIUM CHLORIDE 0.9 % IV BOLUS (SEPSIS)
1000.0000 mL | Freq: Once | INTRAVENOUS | Status: AC
  Administered 2015-09-24: 1000 mL via INTRAVENOUS

## 2015-09-24 MED ORDER — DIPHENHYDRAMINE HCL 50 MG/ML IJ SOLN
25.0000 mg | Freq: Once | INTRAMUSCULAR | Status: DC
  Filled 2015-09-24: qty 1

## 2015-09-24 NOTE — ED Provider Notes (Signed)
CSN: [id number]     Arrival date & time 09/24/15  0118 History   First MD Initiated Contact with Patient 09/24/15 0129     Chief Complaint  Patient presents with  . Migraine     (Consider location/radiation/quality/duration/timing/severity/associated sxs/prior Treatment) HPI Comments: Patient is a 44 year old female with a past medical history of GERD, anxiety, depression who presents with a headache for 2 days. Patient reports a gradual onset and progressive worsening of the headache. The pain is sharp, constant and is located in generalized head without radiation. Patient has tried tylenol and ibuprofen for symptoms without relief. No alleviating/aggravating factors. Patient reports associated nausea and dizziness. Patient denies fever, vomiting, diarrhea, numbness/tingling, weakness, visual changes, congestion, chest pain, SOB, abdominal pain. Patient has a Neurologist that she has been seeing and has a scheduled MRI in 2 weeks.      Past Medical History  Diagnosis Date  . Esophageal stricture   . Chest pain 09/14/2009    no pulmonary embolus by chest CT  . Palpitations   . Elevated BP   . Anxiety     past Hx  . Depression     past Hx  . GERD (gastroesophageal reflux disease)     no meds  . Sickle cell trait ([place])   . Fibroid   . Abnormal Pap smear     cryo, normal since   Past Surgical History  Procedure Laterality Date  . Cesarean section  2004  . Laparoscopy for ectopic pregnancy  2001  . Wisdom tooth extraction  2012   Family History  Problem Relation Age of Onset  . Asthma Mother   . Hypertension Mother   . Hypertension Paternal Uncle   . Asthma Maternal Grandmother   . Colon cancer Neg Hx   . Esophageal cancer Neg Hx   . Stomach cancer Neg Hx   . Asthma Daughter   . Thyroid disease Mother   . Heart Problems Mother    Social History  Substance Use Topics  . Smoking status: Never Smoker   . Smokeless tobacco: Never Used  . Alcohol Use: No   OB History     Gravida Para Term Preterm AB TAB SAB Ectopic Multiple Living   6 2 2  0 3 0 0 3 0 2     Review of Systems  HENT: Positive for sinus pressure.   Gastrointestinal: Positive for nausea.  Neurological: Positive for dizziness and headaches.  All other systems reviewed and are negative.     Allergies  Diphenhydramine hcl; Hydromorphone hcl; and Morphine  Home Medications   Prior to Admission medications   Medication Sig Start Date End Date Taking? Authorizing Provider  acetaminophen (TYLENOL) 325 MG tablet Take 650 mg by mouth every 6 (six) hours as needed for mild pain.    Historical Provider, MD  clonazePAM (KLONOPIN) 0.5 MG tablet Take 0.5 mg by mouth daily as needed. (ANXIETY) 07/17/15   Historical Provider, MD  escitalopram (LEXAPRO) 10 MG tablet Take 1 tablet (10 mg total) by mouth daily. 09/22/15   Britt Bottom, MD  famotidine (PEPCID) 20 MG tablet Take 20 mg by mouth at bedtime.    Historical Provider, MD   BP 122/78 mmHg  Pulse 80  Temp(Src) 98.2 F (36.8 C) (Oral)  Resp 20  Ht 5\' 6"  (1.676 m)  Wt 198 lb (89.812 kg)  BMI 31.97 kg/m2  SpO2 99%  LMP 09/11/2015 Physical Exam  Constitutional: She is oriented to person, place, and time.  She appears well-developed and well-nourished. No distress.  HENT:  Head: Normocephalic and atraumatic.  Right maxillary sinus tenderness to palpation.   Eyes: Conjunctivae and EOM are normal.  Neck: Normal range of motion.  Cardiovascular: Normal rate and regular rhythm.  Exam reveals no gallop and no friction rub.   No murmur heard. Pulmonary/Chest: Effort normal and breath sounds normal. She has no wheezes. She has no rales. She exhibits no tenderness.  Abdominal: Soft. She exhibits no distension. There is no tenderness. There is no rebound.  Musculoskeletal: Normal range of motion.  Neurological: She is alert and oriented to person, place, and time. No cranial nerve deficit. Coordination normal.  Speech is goal-oriented. Moves  limbs without ataxia. Extremity strength and sensation equal and intact bilaterally.   Skin: Skin is warm and dry.  Psychiatric: She has a normal mood and affect. Her behavior is normal.  Nursing note and vitals reviewed.   ED Course  Procedures (including critical care time) Labs Review Labs Reviewed - No data to display  Imaging Review No results found. I have personally reviewed and evaluated these images and lab results as part of my medical decision-making.   EKG Interpretation None      MDM   Final diagnoses:  Migraine without status migrainosus, not intractable, unspecified migraine type    1:55 AM Patient has no neuro deficits at this time. Patient will have migraine cocktail of toradol, reglan, and benadryl. Vitals stable and patient afebrile.    Alvina Chou, PA-C 09/24/15 [place], MD 09/24/15 [phone]

## 2015-09-24 NOTE — ED Notes (Signed)
Pt is complaining of a migraine headache that started yesterday at 18:00. Pt has took Tylenol 2 tablets about 1 hr ago with no relief. Nausea, dizziness.

## 2015-09-24 NOTE — Telephone Encounter (Signed)
Patient informed of results.  Stated verbal understanding 

## 2015-09-24 NOTE — Progress Notes (Signed)
CLINIC ENCOUNTER NOTE  History:  44 y.o. [id number] here today as  [place] patient to discuss irregular menstrual periods and pelvic pain.  She had been followed by [facility]; but no longer wants to be their patient.  She reports having menstrual cycle changes since end of 2015. Cycle length changed from about 30 days to 27-28 days, but still lasts 5 days with medium-heavy flow. This is associated with cramping; alleviated by Tylenol or NSAIDs.  Not anemic, has been checked by other providers but reports feeling like she has low energy.  Reports increased white-pink vaginal discharge over the past few months, no vulvar irritation.  Wants this evaluated.  She denies any fevers, nausea, chills, GI or GU problems or other concerns.   Past Medical History  Diagnosis Date  . Esophageal stricture   . Chest pain 09/14/2009    no pulmonary embolus by chest CT  . Palpitations   . Elevated BP   . Anxiety     past Hx  . Depression     past Hx  . GERD (gastroesophageal reflux disease)     no meds  . Sickle cell trait ([place])   . Fibroid   . Abnormal Pap smear     cryo, normal since  . Fibroids 09/24/2015    Past Surgical History  Procedure Laterality Date  . Cesarean section  2004  . Laparoscopy for ectopic pregnancy  2001  . Wisdom tooth extraction  2012    The following portions of the patient's history were reviewed and updated as appropriate: allergies, current medications, past family history, past medical history, past social history, past surgical history and problem list.   Health Maintenance:  Normal pap and negative HRHPV un 11/2014 at [facility]  Normal mammogram on 04/16/2015.   Review of Systems:  Pertinent items noted in HPI and remainder of comprehensive ROS otherwise negative.  Objective:  Physical Exam BP 115/77 mmHg  Pulse 101  Resp 16  Ht 5\' 6"  (1.676 m)  Wt 202 lb (91.627 kg)  BMI 32.62 kg/m2  LMP 09/11/2015 CONSTITUTIONAL: Well-developed, well-nourished female in no acute  distress.  HENT:  Normocephalic, atraumatic. External right and left ear normal. Oropharynx is clear and moist EYES: Conjunctivae and EOM are normal. Pupils are equal, round, and reactive to light. No scleral icterus.  NECK: Normal range of motion, supple, no masses SKIN: Skin is warm and dry. No rash noted. Not diaphoretic. No erythema. No pallor. [place]: Alert and oriented to person, place, and time. Normal reflexes, muscle tone coordination. No cranial nerve deficit noted. PSYCHIATRIC: Normal mood and affect. Normal behavior. Normal judgment and thought content. CARDIOVASCULAR: Normal heart rate noted RESPIRATORY: Effort and breath sounds normal, no problems with respiration noted ABDOMEN: Soft, no distention noted.   PELVIC: Normal appearing external genitalia; normal appearing vaginal mucosa and cervix.  Some white, thin discharge noted; cultures obtained.  Enlarged uterus of about 14 weeks size, no other palpable masses, mild uterine and adnexal tenderness. MUSCULOSKELETAL: Normal range of motion. No edema noted.  Assessment & Plan:   1. Uterine leiomyoma, unspecified location 2. Pelvic pain in female 3. Menstrual cycle problem Reassured that cycle length is within normal limits, periods are regular, no menorrhagia reported. No anemia.  CT scan in 2013 showed 3.5 cm fibroid, will order ultrasound to reevaluate this. - US Pelvis Complete; Future - US Transvaginal Non-OB; Future Will follow up results and manage accordingly.  NSIADs recommended for pain.kl   4. Vaginal discharge Appears normal but  will follow up results and manage accordingly. - WET PREP BY MOLECULAR PROBE - GC/Chlamydia probe amp ([place])not at [facility]  Routine preventative health maintenance measures emphasized. Please refer to After Visit Summary for other counseling recommendations.   Return in about 2 weeks (around 10/08/2015) for Followup for pelvic pain, fibroids.  Total face-to-face time with  patient: 30 minutes. Over 50% of encounter was spent on counseling and coordination of care.   Verita Schneiders, MD, [place] Attending Obstetrician & Gynecologist, [place] for [facility]

## 2015-09-24 NOTE — Discharge Instructions (Signed)
Follow up with your Neurologist for further evaluation. Refer to attached documents for more information. Return to the ED with worsening concerning symptoms.

## 2015-09-24 NOTE — Patient Instructions (Signed)
Uterine Fibroids Uterine fibroids are tissue masses (tumors) that can develop in the womb (uterus). They are also called leiomyomas. This type of tumor is not cancerous (benign) and does not spread to other parts of the body outside of the pelvic area, which is between the hip bones. Occasionally, fibroids may develop in the fallopian tubes, in the cervix, or on the support structures (ligaments) that surround the uterus. You can have one or many fibroids. Fibroids can vary in size, weight, and where they grow in the uterus. Some can become quite large. Most fibroids do not require medical treatment. CAUSES A fibroid can develop when a single uterine cell keeps growing (replicating). Most cells in the human body have a control mechanism that keeps them from replicating without control. SIGNS AND SYMPTOMS Symptoms may include:   Heavy bleeding during your period.  Bleeding or spotting between periods.  Pelvic pain and pressure.  Bladder problems, such as needing to urinate more often (urinary frequency) or urgently.  Inability to reproduce offspring (infertility).  Miscarriages. DIAGNOSIS Uterine fibroids are diagnosed through a physical exam. Your health care provider may feel the lumpy tumors during a pelvic exam. Ultrasonography and an MRI may be done to determine the size, location, and number of fibroids. TREATMENT Treatment may include:  Watchful waiting. This involves getting the fibroid checked by your health care provider to see if it grows or shrinks. Follow your health care provider's recommendations for how often to have this checked.  Hormone medicines. These can be taken by mouth or given through an intrauterine device (IUD).  Surgery.  Removing the fibroids (myomectomy) or the uterus (hysterectomy).  Removing blood supply to the fibroids (uterine artery embolization). If fibroids interfere with your fertility and you want to become pregnant, your health care provider  may recommend having the fibroids removed.  HOME CARE INSTRUCTIONS  Keep all follow-up visits as directed by your health care provider. This is important.  Take medicines only as directed by your health care provider.  If you were prescribed a hormone treatment, take the hormone medicines exactly as directed.  Do not take aspirin, because it can cause bleeding.  Ask your health care provider about taking iron pills and increasing the amount of dark green, leafy vegetables in your diet. These actions can help to boost your blood iron levels, which may be affected by heavy menstrual bleeding.  Pay close attention to your period and tell your health care provider about any changes, such as:  Increased blood flow that requires you to use more pads or tampons than usual per month.  A change in the number of days that your period lasts per month.  A change in symptoms that are associated with your period, such as abdominal cramping or back pain. SEEK MEDICAL CARE IF:  You have pelvic pain, back pain, or abdominal cramps that cannot be controlled with medicines.  You have an increase in bleeding between and during periods.  You soak tampons or pads in a half hour or less.  You feel lightheaded, extra tired, or weak. SEEK IMMEDIATE MEDICAL CARE IF:  You faint.  You have a sudden increase in pelvic pain.   This information is not intended to replace advice given to you by your health care provider. Make sure you discuss any questions you have with your health care provider.   Document Released: 11/11/2000 Document Revised: 12/05/2014 Document Reviewed: 05/13/2014 Elsevier Interactive Patient Education 2016 [employer] Inc.  

## 2015-09-25 ENCOUNTER — Telehealth: Payer: Self-pay | Admitting: *Deleted

## 2015-09-25 DIAGNOSIS — B9689 Other specified bacterial agents as the cause of diseases classified elsewhere: Secondary | ICD-10-CM

## 2015-09-25 DIAGNOSIS — N760 Acute vaginitis: Principal | ICD-10-CM

## 2015-09-25 LAB — WET PREP BY MOLECULAR PROBE
CANDIDA SPECIES: NEGATIVE
Gardnerella vaginalis: POSITIVE — AB
TRICHOMONAS VAG: NEGATIVE

## 2015-09-25 MED ORDER — METRONIDAZOLE 500 MG PO TABS: 500.0000 mg | ORAL_TABLET | Freq: Two times a day (BID) | ORAL | Status: DC

## 2015-09-25 NOTE — Telephone Encounter (Signed)
LM on voicemail of positive [place] and RX went to [facility] on [address] per protocol.

## 2015-09-28 ENCOUNTER — Ambulatory Visit (INDEPENDENT_AMBULATORY_CARE_PROVIDER_SITE_OTHER): Payer: BLUE CROSS/BLUE SHIELD | Admitting: Psychology

## 2015-09-28 ENCOUNTER — Encounter: Payer: BLUE CROSS/BLUE SHIELD | Admitting: Obstetrics & Gynecology

## 2015-09-28 DIAGNOSIS — F411 Generalized anxiety disorder: Secondary | ICD-10-CM

## 2015-09-28 LAB — GC/CHLAMYDIA PROBE AMP (~~LOC~~) NOT AT ARMC
Chlamydia: NEGATIVE
Neisseria Gonorrhea: NEGATIVE

## 2015-09-29 ENCOUNTER — Ambulatory Visit (HOSPITAL_COMMUNITY): Admission: RE | Admit: 2015-09-29 | Payer: BLUE CROSS/BLUE SHIELD | Source: Ambulatory Visit

## 2015-09-29 ENCOUNTER — Emergency Department (HOSPITAL_BASED_OUTPATIENT_CLINIC_OR_DEPARTMENT_OTHER): Payer: BLUE CROSS/BLUE SHIELD

## 2015-09-29 ENCOUNTER — Emergency Department (HOSPITAL_BASED_OUTPATIENT_CLINIC_OR_DEPARTMENT_OTHER)
Admission: EM | Admit: 2015-09-29 | Discharge: 2015-09-30 | Disposition: A | Discharge status: Home / Self Care | Payer: BLUE CROSS/BLUE SHIELD | Attending: Emergency Medicine | Admitting: Emergency Medicine

## 2015-09-29 ENCOUNTER — Encounter (HOSPITAL_BASED_OUTPATIENT_CLINIC_OR_DEPARTMENT_OTHER): Payer: Self-pay | Admitting: Emergency Medicine

## 2015-09-29 DIAGNOSIS — Z86018 Personal history of other benign neoplasm: Secondary | ICD-10-CM | POA: Insufficient documentation

## 2015-09-29 DIAGNOSIS — Z8719 Personal history of other diseases of the digestive system: Secondary | ICD-10-CM | POA: Diagnosis not present

## 2015-09-29 DIAGNOSIS — Z862 Personal history of diseases of the blood and blood-forming organs and certain disorders involving the immune mechanism: Secondary | ICD-10-CM | POA: Diagnosis not present

## 2015-09-29 DIAGNOSIS — R51 Headache: Secondary | ICD-10-CM | POA: Diagnosis not present

## 2015-09-29 DIAGNOSIS — Z3202 Encounter for pregnancy test, result negative: Secondary | ICD-10-CM | POA: Diagnosis not present

## 2015-09-29 DIAGNOSIS — F329 Major depressive disorder, single episode, unspecified: Secondary | ICD-10-CM | POA: Insufficient documentation

## 2015-09-29 DIAGNOSIS — Z79899 Other long term (current) drug therapy: Secondary | ICD-10-CM | POA: Insufficient documentation

## 2015-09-29 DIAGNOSIS — R519 Headache, unspecified: Secondary | ICD-10-CM

## 2015-09-29 DIAGNOSIS — F419 Anxiety disorder, unspecified: Secondary | ICD-10-CM | POA: Diagnosis not present

## 2015-09-29 LAB — BASIC METABOLIC PANEL
ANION GAP: 8 (ref 5–15)
BUN: 8 mg/dL (ref 6–20)
CALCIUM: 9 mg/dL (ref 8.9–10.3)
CHLORIDE: 104 mmol/L (ref 101–111)
CO2: 26 mmol/L (ref 22–32)
Creatinine, Ser: 0.58 mg/dL (ref 0.44–1.00)
GFR calc non Af Amer: >60 mL/min (ref >60)
Glucose, Bld: 93 mg/dL (ref 65–99)
POTASSIUM: 3.6 mmol/L (ref 3.5–5.1)
Sodium: 138 mmol/L (ref 135–145)

## 2015-09-29 LAB — CBC WITH DIFFERENTIAL/PLATELET
BASOS PCT: 0 %
Basophils Absolute: 0 10*3/uL (ref 0.0–0.1)
EOS PCT: 0 %
Eosinophils Absolute: 0 10*3/uL (ref 0.0–0.7)
HEMATOCRIT: 36.9 % (ref 36.0–46.0)
HEMOGLOBIN: 12.7 g/dL (ref 12.0–15.0)
Lymphocytes Relative: 29 %
Lymphs Abs: 2.5 10*3/uL (ref 0.7–4.0)
MCH: 28.3 pg (ref 26.0–34.0)
MCHC: 34.4 g/dL (ref 30.0–36.0)
MCV: 82.2 fL (ref 78.0–100.0)
MONOS PCT: 8 %
Monocytes Absolute: 0.7 10*3/uL (ref 0.1–1.0)
NEUTROS PCT: 63 %
Neutro Abs: 5.5 10*3/uL (ref 1.7–7.7)
Platelets: 378 10*3/uL (ref 150–400)
RBC: 4.49 MIL/uL (ref 3.87–5.11)
RDW: 12.4 % (ref 11.5–15.5)
WBC: 8.7 10*3/uL (ref 4.0–10.5)

## 2015-09-29 LAB — TROPONIN I

## 2015-09-29 LAB — D-DIMER, QUANTITATIVE: D-Dimer, Quant: 0.55 ug/mL-FEU — ABNORMAL HIGH (ref 0.00–0.48)

## 2015-09-29 LAB — PREGNANCY, URINE: Preg Test, Ur: NEGATIVE

## 2015-09-29 MED ORDER — IOHEXOL 350 MG/ML SOLN
100.0000 mL | Freq: Once | INTRAVENOUS | Status: AC | PRN
  Administered 2015-09-29: 100 mL via INTRAVENOUS

## 2015-09-29 MED ORDER — ONDANSETRON HCL 4 MG/2ML IJ SOLN
4.0000 mg | Freq: Once | INTRAMUSCULAR | Status: DC
  Filled 2015-09-29: qty 2

## 2015-09-29 MED ORDER — DEXAMETHASONE SODIUM PHOSPHATE 10 MG/ML IJ SOLN
10.0000 mg | Freq: Once | INTRAMUSCULAR | Status: AC
  Administered 2015-09-29: 10 mg via INTRAVENOUS
  Filled 2015-09-29: qty 1

## 2015-09-29 MED ORDER — DIPHENHYDRAMINE HCL 50 MG/ML IJ SOLN
25.0000 mg | Freq: Once | INTRAMUSCULAR | Status: DC
  Filled 2015-09-29: qty 1

## 2015-09-29 MED ORDER — SODIUM CHLORIDE 0.9 % IV BOLUS (SEPSIS)
1000.0000 mL | Freq: Once | INTRAVENOUS | Status: AC
Start: 2015-09-29 — End: 2015-09-29
  Administered 2015-09-29: 1000 mL via INTRAVENOUS

## 2015-09-29 MED ORDER — KETOROLAC TROMETHAMINE 30 MG/ML IJ SOLN
30.0000 mg | Freq: Once | INTRAMUSCULAR | Status: AC
  Administered 2015-09-29: 30 mg via INTRAVENOUS
  Filled 2015-09-29: qty 1

## 2015-09-29 MED ORDER — METOCLOPRAMIDE HCL 5 MG/ML IJ SOLN
10.0000 mg | Freq: Once | INTRAMUSCULAR | Status: AC
Start: 2015-09-29 — End: 2015-09-29
  Administered 2015-09-29: 10 mg via INTRAVENOUS
  Filled 2015-09-29: qty 2

## 2015-09-29 NOTE — Discharge Instructions (Signed)

## 2015-09-29 NOTE — ED Notes (Signed)
Patient reports that she is having Headache and Nausea. She has pain throughout her neck and arms, numbness to her bilateral legs. Reports she is going to the neurologist for r/o MS

## 2015-09-29 NOTE — ED Provider Notes (Signed)
CSN: [id number]     Arrival date & time 09/29/15  1553 History   First MD Initiated Contact with Patient 09/29/15 1732     Chief Complaint  Patient presents with  . Headache     (Consider location/radiation/quality/duration/timing/severity/associated sxs/prior Treatment) Patient is a 44 y.o. female presenting with headaches and chest pain.  Headache Associated symptoms: no abdominal pain and no dizziness   Chest Pain Pain location:  Substernal area Pain quality comment:  Soreness Pain radiates to:  Does not radiate Pain radiates to the back: no   Pain severity:  Moderate Onset quality:  Gradual Duration:  1 day Timing:  Constant Progression:  Unchanged Chronicity:  New Context: not breathing   Relieved by:  Nothing Worsened by:  Nothing tried Associated symptoms: headache   Associated symptoms: no abdominal pain, no dizziness and no dysphagia     Past Medical History  Diagnosis Date  . Esophageal stricture   . Chest pain 09/14/2009    no pulmonary embolus by chest CT  . Palpitations   . Elevated BP   . Anxiety     past Hx  . Depression     past Hx  . GERD (gastroesophageal reflux disease)     no meds  . Sickle cell trait ([place])   . Fibroid   . Abnormal Pap smear     cryo, normal since  . Fibroids 09/24/2015   Past Surgical History  Procedure Laterality Date  . Cesarean section  2004  . Laparoscopy for ectopic pregnancy  2001  . Wisdom tooth extraction  2012   Family History  Problem Relation Age of Onset  . Asthma Mother   . Hypertension Mother   . Hypertension Paternal Uncle   . Asthma Maternal Grandmother   . Colon cancer Neg Hx   . Esophageal cancer Neg Hx   . Stomach cancer Neg Hx   . Asthma Daughter   . Thyroid disease Mother   . Heart Problems Mother    Social History  Substance Use Topics  . Smoking status: Never Smoker   . Smokeless tobacco: Never Used  . Alcohol Use: No   OB History    Gravida Para Term Preterm AB TAB SAB Ectopic  Multiple Living   6 2 2  0 3 0 0 3 0 2     Review of Systems  HENT: Negative for trouble swallowing.   Cardiovascular: Positive for chest pain.  Gastrointestinal: Negative for abdominal pain.  Neurological: Positive for headaches. Negative for dizziness.  All other systems reviewed and are negative.     Allergies  Diphenhydramine hcl; Hydromorphone hcl; and Morphine  Home Medications   Prior to Admission medications   Medication Sig Start Date End Date Taking? Authorizing Provider  acetaminophen (TYLENOL) 325 MG tablet Take 650 mg by mouth every 6 (six) hours as needed for mild pain.    Historical Provider, MD  amoxicillin-clavulanate (AUGMENTIN) 875-125 MG tablet Take 1 tablet by mouth every 12 (twelve) hours. 09/24/15   Kaitlyn Szekalski, PA-C  escitalopram (LEXAPRO) 10 MG tablet Take 1 tablet (10 mg total) by mouth daily. Patient taking differently: Take 5 mg by mouth daily.  09/22/15   Britt Bottom, MD  famotidine (PEPCID) 20 MG tablet Take 20 mg by mouth at bedtime.    Historical Provider, MD  metroNIDAZOLE (FLAGYL) 500 MG tablet Take 1 tablet (500 mg total) by mouth 2 (two) times daily. 09/25/15   Osborne Oman, MD   BP 113/78 mmHg  Pulse 90  Temp(Src) 98.3 F (36.8 C) (Oral)  Resp 18  Ht 5\' 6"  (1.676 m)  Wt 198 lb (89.812 kg)  BMI 31.97 kg/m2  SpO2 99%  LMP 09/11/2015 Physical Exam  Constitutional: She is oriented to person, place, and time. She appears well-developed and well-nourished.  HENT:  Head: Normocephalic and atraumatic.  Right Ear: External ear normal.  Left Ear: External ear normal.  Eyes: Conjunctivae and EOM are normal. Pupils are equal, round, and reactive to light.  Neck: Normal range of motion. Neck supple.  Cardiovascular: Normal rate, regular rhythm, normal heart sounds and intact distal pulses.   Pulmonary/Chest: Effort normal and breath sounds normal.  Abdominal: Soft. Bowel sounds are normal. There is no tenderness.  Musculoskeletal:  Normal range of motion.       Right lower leg: She exhibits no swelling.       Left lower leg: She exhibits tenderness. She exhibits no swelling.  Neurological: She is alert and oriented to person, place, and time.  Skin: Skin is warm and dry.  Vitals reviewed.   ED Course  Procedures (including critical care time) Labs Review Labs Reviewed  D-DIMER, QUANTITATIVE (NOT AT [facility]) - Abnormal; Notable for the following:    D-Dimer, Quant 0.55 (*)    All other components within normal limits  CBC WITH DIFFERENTIAL/PLATELET  BASIC METABOLIC PANEL  PREGNANCY, URINE  TROPONIN I  TROPONIN I    Imaging Review Ct Angio Chest Pe W/cm &/or Wo Cm  09/29/2015  CLINICAL DATA:  Headaches for 2 weeks. Mid chest pain today. Shortness of breath. Elevated D-dimer. EXAM: CT ANGIOGRAPHY CHEST WITH CONTRAST TECHNIQUE: Multidetector CT imaging of the chest was performed using the standard protocol during bolus administration of intravenous contrast. Multiplanar CT image reconstructions and MIPs were obtained to evaluate the vascular anatomy. CONTRAST:  137mL OMNIPAQUE IOHEXOL 350 MG/ML SOLN COMPARISON:  08/14/2015 FINDINGS: Technically adequate study with good opacification of the central and segmental pulmonary arteries. No focal filling defects are demonstrated. No evidence of significant pulmonary embolus. Normal heart size. Normal caliber thoracic aorta. No aortic dissection. Great vessel origins are patent. Esophagus is decompressed. No significant lymphadenopathy in the chest. Evaluation of lungs is limited due to respiratory motion artifact. No focal consolidation or airspace disease is suggested. No pleural effusions. No pneumothorax. Airways appear patent. Included portions of the upper abdominal organs are grossly unremarkable. Degenerative changes in the spine. No destructive bone lesions. Review of the MIP images confirms the above findings. IMPRESSION: No evidence of significant pulmonary embolus. No  evidence of active pulmonary disease. Electronically Signed   By: Lucienne Capers M.D.   On: 09/29/2015 20:36   US Venous Img Lower Unilateral Left  09/29/2015  CLINICAL DATA:  Headaches for 2 weeks. Left lower extremity pain and edema for 2 weeks. Shortness of breath since here. EXAM: Left LOWER EXTREMITY VENOUS DOPPLER ULTRASOUND TECHNIQUE: Gray-scale sonography with graded compression, as well as color Doppler and duplex ultrasound were performed to evaluate the lower extremity deep venous systems from the level of the common femoral vein and including the common femoral, femoral, profunda femoral, popliteal and calf veins including the posterior tibial, peroneal and gastrocnemius veins when visible. The superficial great saphenous vein was also interrogated. Spectral Doppler was utilized to evaluate flow at rest and with distal augmentation maneuvers in the common femoral, femoral and popliteal veins. COMPARISON:  None to 2015 FINDINGS: Contralateral Common Femoral Vein: Respiratory phasicity is normal and symmetric with the symptomatic side. No  evidence of thrombus. Normal compressibility. Common Femoral Vein: No evidence of thrombus. Normal compressibility, respiratory phasicity and response to augmentation. Saphenofemoral Junction: No evidence of thrombus. Normal compressibility and flow on color Doppler imaging. Profunda Femoral Vein: No evidence of thrombus. Normal compressibility and flow on color Doppler imaging. Femoral Vein: No evidence of thrombus. Normal compressibility, respiratory phasicity and response to augmentation. Popliteal Vein: No evidence of thrombus. Normal compressibility, respiratory phasicity and response to augmentation. Calf Veins: No evidence of thrombus. Normal compressibility and flow on color Doppler imaging. Superficial Great Saphenous Vein: No evidence of thrombus. Normal compressibility and flow on color Doppler imaging. Venous Reflux:  None. Other Findings:  None.  IMPRESSION: No evidence of deep venous thrombosis. Electronically Signed   By: Lucienne Capers M.D.   On: 09/29/2015 20:56   I have personally reviewed and evaluated these images and lab results as part of my medical decision-making.   EKG Interpretation   Date/Time:  Tuesday September 29 2015 19:28:43 EDT Ventricular Rate:  83 PR Interval:  148 QRS Duration: 102 QT Interval:  362 QTC Calculation: 425 R Axis:   63 Text Interpretation:  Normal sinus rhythm Incomplete right bundle branch  block Borderline ECG ED PHYSICIAN INTERPRETATION AVAILABLE IN [facility] Confirmed by TEST, Record ([id number]) on 09/30/2015 7:02:54 AM      MDM   Final diagnoses:  Acute nonintractable headache, unspecified headache type    45 y.o. female with pertinent PMH of GERD, anxiety, depression presents with chest pain and ha as above.  Pt has been seen for identical symptoms in the past with unremarkable workup. She has planned neurology follow-up. She states headache is chronic and unchanged. Chest pain began today and is different.   Workup as above unremarkable. Discharged home in stable condition with follow-up with neurology.  I have reviewed all laboratory and imaging studies if ordered as above  1. Acute nonintractable headache, unspecified headache type         Debby Freiberg, MD 10/01/15 [phone]

## 2015-10-01 ENCOUNTER — Emergency Department (HOSPITAL_COMMUNITY)
Admission: EM | Admit: 2015-10-01 | Discharge: 2015-10-01 | Disposition: A | Discharge status: Home / Self Care | Payer: BLUE CROSS/BLUE SHIELD | Attending: Emergency Medicine | Admitting: Emergency Medicine

## 2015-10-01 DIAGNOSIS — R1013 Epigastric pain: Secondary | ICD-10-CM | POA: Diagnosis not present

## 2015-10-01 DIAGNOSIS — F329 Major depressive disorder, single episode, unspecified: Secondary | ICD-10-CM | POA: Insufficient documentation

## 2015-10-01 DIAGNOSIS — R079 Chest pain, unspecified: Secondary | ICD-10-CM

## 2015-10-01 DIAGNOSIS — K219 Gastro-esophageal reflux disease without esophagitis: Secondary | ICD-10-CM | POA: Insufficient documentation

## 2015-10-01 DIAGNOSIS — R109 Unspecified abdominal pain: Secondary | ICD-10-CM | POA: Diagnosis present

## 2015-10-01 DIAGNOSIS — Z862 Personal history of diseases of the blood and blood-forming organs and certain disorders involving the immune mechanism: Secondary | ICD-10-CM | POA: Diagnosis not present

## 2015-10-01 DIAGNOSIS — F419 Anxiety disorder, unspecified: Secondary | ICD-10-CM | POA: Insufficient documentation

## 2015-10-01 DIAGNOSIS — Z79899 Other long term (current) drug therapy: Secondary | ICD-10-CM | POA: Insufficient documentation

## 2015-10-01 DIAGNOSIS — Z86018 Personal history of other benign neoplasm: Secondary | ICD-10-CM | POA: Insufficient documentation

## 2015-10-01 DIAGNOSIS — R51 Headache: Secondary | ICD-10-CM | POA: Diagnosis not present

## 2015-10-01 DIAGNOSIS — Z3202 Encounter for pregnancy test, result negative: Secondary | ICD-10-CM | POA: Diagnosis not present

## 2015-10-01 DIAGNOSIS — R519 Headache, unspecified: Secondary | ICD-10-CM

## 2015-10-01 LAB — COMPREHENSIVE METABOLIC PANEL
ALBUMIN: 3.8 g/dL (ref 3.5–5.0)
ALT: 12 U/L — AB (ref 14–54)
AST: 18 U/L (ref 15–41)
Alkaline Phosphatase: 43 U/L (ref 38–126)
Anion gap: 5 (ref 5–15)
BUN: 21 mg/dL — AB (ref 6–20)
CHLORIDE: 109 mmol/L (ref 101–111)
CO2: 24 mmol/L (ref 22–32)
CREATININE: 0.78 mg/dL (ref 0.44–1.00)
Calcium: 8.7 mg/dL — ABNORMAL LOW (ref 8.9–10.3)
GFR calc Af Amer: >60 mL/min (ref >60)
GFR calc non Af Amer: >60 mL/min (ref >60)
GLUCOSE: 96 mg/dL (ref 65–99)
POTASSIUM: 3.4 mmol/L — AB (ref 3.5–5.1)
Sodium: 138 mmol/L (ref 135–145)
Total Bilirubin: 0.8 mg/dL (ref 0.3–1.2)
Total Protein: 7.5 g/dL (ref 6.5–8.1)

## 2015-10-01 LAB — CBC
HEMATOCRIT: 33.3 % — AB (ref 36.0–46.0)
Hemoglobin: 11.6 g/dL — ABNORMAL LOW (ref 12.0–15.0)
MCH: 28.8 pg (ref 26.0–34.0)
MCHC: 34.8 g/dL (ref 30.0–36.0)
MCV: 82.6 fL (ref 78.0–100.0)
PLATELETS: 251 10*3/uL (ref 150–400)
RBC: 4.03 MIL/uL (ref 3.87–5.11)
RDW: 12.9 % (ref 11.5–15.5)
WBC: 8.3 10*3/uL (ref 4.0–10.5)

## 2015-10-01 LAB — I-STAT TROPONIN, ED: TROPONIN I, POC: 0 ng/mL (ref 0.00–0.08)

## 2015-10-01 LAB — I-STAT BETA HCG BLOOD, ED (MC, WL, AP ONLY): I-stat hCG, quantitative: <5 m[IU]/mL (ref <5)

## 2015-10-01 LAB — LIPASE, BLOOD: LIPASE: 24 U/L (ref 11–51)

## 2015-10-01 NOTE — ED Notes (Signed)
Pt. Made aware for the need of urine. 

## 2015-10-01 NOTE — Discharge Instructions (Signed)
Please follow up closely with your neurologist at [facility] and with your Primary Care provider for further management of your condition.

## 2015-10-01 NOTE — ED Provider Notes (Signed)
CSN: [id number]     Arrival date & time 10/01/15  0109 History   First MD Initiated Contact with Patient 10/01/15 0228     Chief Complaint  Patient presents with  . Abdominal Pain  . Chest Pain     (Consider location/radiation/quality/duration/timing/severity/associated sxs/prior Treatment) HPI   44 year old female with history of recurrent chest pain, chronic, sickle cell trait, esophageal stricture, anxiety and depression present requesting evaluation for chest pain abdominal pain. Patient complains of indigestion sensation with burning midsternal chest pain. She attributed the symptoms to heartburn. She has had recurrent chest pain abdominal pain is in the past. Her primary concern today is a headache. Patient states she has had persistent recurrent right-sided headache for the past month. She has been evaluated by PCP who referred to a neurologist. She has been seen by neurologist 2 weeks ago and states that she has further testing ordered. Today her headache was worsening with occasional vision disturbance. She denies taking Tylenol without relief. No complaint of neck stiffness, focal numbness or weakness, fever chill or rash. She has been evaluated several times within the past few days for various complaints including headache and chest pain. She has had prior workup for this without acute finding.  Past Medical History  Diagnosis Date  . Esophageal stricture   . Chest pain 09/14/2009    no pulmonary embolus by chest CT  . Palpitations   . Elevated BP   . Anxiety     past Hx  . Depression     past Hx  . GERD (gastroesophageal reflux disease)     no meds  . Sickle cell trait ([place])   . Fibroid   . Abnormal Pap smear     cryo, normal since  . Fibroids 09/24/2015   Past Surgical History  Procedure Laterality Date  . Cesarean section  2004  . Laparoscopy for ectopic pregnancy  2001  . Wisdom tooth extraction  2012   Family History  Problem Relation Age of Onset  . Asthma  Mother   . Hypertension Mother   . Hypertension Paternal Uncle   . Asthma Maternal Grandmother   . Colon cancer Neg Hx   . Esophageal cancer Neg Hx   . Stomach cancer Neg Hx   . Asthma Daughter   . Thyroid disease Mother   . Heart Problems Mother    Social History  Substance Use Topics  . Smoking status: Never Smoker   . Smokeless tobacco: Never Used  . Alcohol Use: No   OB History    Gravida Para Term Preterm AB TAB SAB Ectopic Multiple Living   6 2 2  0 3 0 0 3 0 2     Review of Systems  All other systems reviewed and are negative.     Allergies  Diphenhydramine hcl; Hydromorphone hcl; and Morphine  Home Medications   Prior to Admission medications   Medication Sig Start Date End Date Taking? Authorizing Provider  acetaminophen (TYLENOL) 325 MG tablet Take 650 mg by mouth every 6 (six) hours as needed for mild pain.   Yes Historical Provider, MD  amoxicillin-clavulanate (AUGMENTIN) 875-125 MG tablet Take 1 tablet by mouth every 12 (twelve) hours. 09/24/15  Yes Kaitlyn Szekalski, PA-C  escitalopram (LEXAPRO) 10 MG tablet Take 1 tablet (10 mg total) by mouth daily. Patient taking differently: Take 5 mg by mouth daily.  09/22/15  Yes Britt Bottom, MD  famotidine (PEPCID) 20 MG tablet Take 20 mg by mouth at bedtime.  Yes Historical Provider, MD  metroNIDAZOLE (FLAGYL) 500 MG tablet Take 1 tablet (500 mg total) by mouth 2 (two) times daily. 09/25/15   Osborne Oman, MD   BP 147/80 mmHg  Pulse 98  Temp(Src) 98.5 F (36.9 C) (Oral)  Resp 18  SpO2 100%  LMP 09/11/2015 Physical Exam  Constitutional: She appears well-developed and well-nourished. No distress.  Afro-American female, nontoxic in appearance however is tearful.  HENT:  Head: Normocephalic and atraumatic.  Right Ear: External ear normal.  Left Ear: External ear normal.  Nose: Nose normal.  Mouth/Throat: Oropharynx is clear and moist.  Eyes: Conjunctivae and EOM are normal. Pupils are equal, round,  and reactive to light.  Neck: Normal range of motion. Neck supple.  No nuchal rigidity  Cardiovascular: Normal rate and regular rhythm.   Pulmonary/Chest: Effort normal and breath sounds normal. She exhibits tenderness (mild epigastric tenderness without guarding or rebound tenderness).  Abdominal: Soft. There is tenderness (Epigastric tenderness).  Neurological: She is alert.  Neurologic exam:  Speech clear, pupils equal round reactive to light, extraocular movements intact  Normal peripheral visual fields Cranial nerves III through XII normal including no facial droop Follows commands, moves all extremities x4, normal strength to bilateral upper and lower extremities at all major muscle groups including grip Sensation normal to light touch  Coordination intact, no limb ataxia, finger-nose-finger normal Rapid alternating movements normal No pronator drift Gait normal   Skin: No rash noted.  Psychiatric: She has a normal mood and affect.  Nursing note and vitals reviewed.   ED Course  Procedures (including critical care time)  Patient here with chest pain abdominal pain. She is frequently seen in the ED for similar complaint. She had a normal chest CT angiogram and a negative DVT study to lower extremity was performed 2 days ago for similar complaint. The labs are reassuring. EKG without acute ischemic changes, normal troponin. Chronic right-sided headache. No focal neuro deficit on exam. No fever or nuchal rigidity concerning for meningitis. At this time and do not think advanced imaging is indicated. She is scheduled to be managed by a neurologist. This time patient is stable for discharge. Tylenol for patient declined. Discussed with Dr. Randal Buba.  Labs Review Labs Reviewed  COMPREHENSIVE METABOLIC PANEL - Abnormal; Notable for the following:    Potassium 3.4 (*)    BUN 21 (*)    Calcium 8.7 (*)    ALT 12 (*)    All other components within normal limits  CBC - Abnormal; Notable  for the following:    Hemoglobin 11.6 (*)    HCT 33.3 (*)    All other components within normal limits  LIPASE, BLOOD  URINALYSIS, ROUTINE W REFLEX MICROSCOPIC (NOT AT [facility])  I-STAT BETA HCG BLOOD, ED ([facility], [facility], [facility] ONLY)  I-STAT TROPOININ, ED    Imaging Review Ct Angio Chest Pe W/cm &/or Wo Cm  09/29/2015  CLINICAL DATA:  Headaches for 2 weeks. Mid chest pain today. Shortness of breath. Elevated D-dimer. EXAM: CT ANGIOGRAPHY CHEST WITH CONTRAST TECHNIQUE: Multidetector CT imaging of the chest was performed using the standard protocol during bolus administration of intravenous contrast. Multiplanar CT image reconstructions and MIPs were obtained to evaluate the vascular anatomy. CONTRAST:  139mL OMNIPAQUE IOHEXOL 350 MG/ML SOLN COMPARISON:  08/14/2015 FINDINGS: Technically adequate study with good opacification of the central and segmental pulmonary arteries. No focal filling defects are demonstrated. No evidence of significant pulmonary embolus. Normal heart size. Normal caliber thoracic aorta. No aortic dissection.  Great vessel origins are patent. Esophagus is decompressed. No significant lymphadenopathy in the chest. Evaluation of lungs is limited due to respiratory motion artifact. No focal consolidation or airspace disease is suggested. No pleural effusions. No pneumothorax. Airways appear patent. Included portions of the upper abdominal organs are grossly unremarkable. Degenerative changes in the spine. No destructive bone lesions. Review of the MIP images confirms the above findings. IMPRESSION: No evidence of significant pulmonary embolus. No evidence of active pulmonary disease. Electronically Signed   By: Lucienne Capers M.D.   On: 09/29/2015 20:36   US Venous Img Lower Unilateral Left  09/29/2015  CLINICAL DATA:  Headaches for 2 weeks. Left lower extremity pain and edema for 2 weeks. Shortness of breath since here. EXAM: Left LOWER EXTREMITY VENOUS DOPPLER ULTRASOUND TECHNIQUE: Gray-scale  sonography with graded compression, as well as color Doppler and duplex ultrasound were performed to evaluate the lower extremity deep venous systems from the level of the common femoral vein and including the common femoral, femoral, profunda femoral, popliteal and calf veins including the posterior tibial, peroneal and gastrocnemius veins when visible. The superficial great saphenous vein was also interrogated. Spectral Doppler was utilized to evaluate flow at rest and with distal augmentation maneuvers in the common femoral, femoral and popliteal veins. COMPARISON:  None to 2015 FINDINGS: Contralateral Common Femoral Vein: Respiratory phasicity is normal and symmetric with the symptomatic side. No evidence of thrombus. Normal compressibility. Common Femoral Vein: No evidence of thrombus. Normal compressibility, respiratory phasicity and response to augmentation. Saphenofemoral Junction: No evidence of thrombus. Normal compressibility and flow on color Doppler imaging. Profunda Femoral Vein: No evidence of thrombus. Normal compressibility and flow on color Doppler imaging. Femoral Vein: No evidence of thrombus. Normal compressibility, respiratory phasicity and response to augmentation. Popliteal Vein: No evidence of thrombus. Normal compressibility, respiratory phasicity and response to augmentation. Calf Veins: No evidence of thrombus. Normal compressibility and flow on color Doppler imaging. Superficial Great Saphenous Vein: No evidence of thrombus. Normal compressibility and flow on color Doppler imaging. Venous Reflux:  None. Other Findings:  None. IMPRESSION: No evidence of deep venous thrombosis. Electronically Signed   By: Lucienne Capers M.D.   On: 09/29/2015 20:56   I have personally reviewed and evaluated these images and lab results as part of my medical decision-making.   EKG Interpretation   Date/Time:  Thursday October 01 2015 01:19:52 EDT Ventricular Rate:  86 PR Interval:  147 QRS  Duration: 103 QT Interval:  347 QTC Calculation: 415 R Axis:   45 Text Interpretation:  Sinus arrhythmia Confirmed by [facility]  MD,  APRIL ([id number]) on 10/01/2015 1:30:47 AM      MDM   Final diagnoses:  Bad headache  Chest pain at rest  Epigastric pain    BP 147/80 mmHg  Pulse 98  Temp(Src) 98.5 F (36.9 C) (Oral)  Resp 18  SpO2 100%  LMP 09/11/2015     Domenic Moras, PA-C 10/01/15 0248  April Palumbo, MD 10/01/15 [id number]

## 2015-10-01 NOTE — ED Notes (Signed)
Pt states that she has been feeling like she is 'on fire' with abdominal pain, indigestion-type feeling chest pain, and headaches (she is being evaluated by a neurologist for these). Afebrile. Alert and oriented.

## 2015-10-05 ENCOUNTER — Ambulatory Visit (INDEPENDENT_AMBULATORY_CARE_PROVIDER_SITE_OTHER): Payer: BLUE CROSS/BLUE SHIELD | Admitting: Psychology

## 2015-10-05 DIAGNOSIS — F411 Generalized anxiety disorder: Secondary | ICD-10-CM

## 2015-10-06 ENCOUNTER — Telehealth: Payer: Self-pay | Admitting: *Deleted

## 2015-10-06 ENCOUNTER — Ambulatory Visit (HOSPITAL_COMMUNITY)
Admission: RE | Admit: 2015-10-06 | Discharge: 2015-10-06 | Disposition: A | Discharge status: Home / Self Care | Payer: BLUE CROSS/BLUE SHIELD | Source: Ambulatory Visit | Attending: Obstetrics & Gynecology | Admitting: Obstetrics & Gynecology

## 2015-10-06 DIAGNOSIS — R102 Pelvic and perineal pain: Secondary | ICD-10-CM | POA: Diagnosis present

## 2015-10-06 DIAGNOSIS — N939 Abnormal uterine and vaginal bleeding, unspecified: Secondary | ICD-10-CM | POA: Diagnosis not present

## 2015-10-06 DIAGNOSIS — D259 Leiomyoma of uterus, unspecified: Secondary | ICD-10-CM | POA: Insufficient documentation

## 2015-10-06 NOTE — Telephone Encounter (Signed)
LM on voicemail regarding U/S report and need to schedule appt with one of the MD's to discuss recommendations/plan of care.

## 2015-10-06 NOTE — Telephone Encounter (Signed)
-----   Message from Osborne Oman, MD sent at 10/06/2015 12:55 PM EST ----- Patient has two fibroids 5.8 cm and 4.5 cm.  She can be seen by any MD to discuss management.    Please call to inform patient of results and recommendations.

## 2015-10-07 ENCOUNTER — Emergency Department (HOSPITAL_COMMUNITY): Payer: BLUE CROSS/BLUE SHIELD

## 2015-10-07 ENCOUNTER — Emergency Department (HOSPITAL_COMMUNITY)
Admission: EM | Admit: 2015-10-07 | Discharge: 2015-10-07 | Disposition: A | Discharge status: Home / Self Care | Payer: BLUE CROSS/BLUE SHIELD | Attending: Emergency Medicine | Admitting: Emergency Medicine

## 2015-10-07 ENCOUNTER — Encounter (HOSPITAL_COMMUNITY): Payer: Self-pay | Admitting: Emergency Medicine

## 2015-10-07 DIAGNOSIS — K219 Gastro-esophageal reflux disease without esophagitis: Secondary | ICD-10-CM | POA: Insufficient documentation

## 2015-10-07 DIAGNOSIS — M2550 Pain in unspecified joint: Secondary | ICD-10-CM

## 2015-10-07 DIAGNOSIS — R0602 Shortness of breath: Secondary | ICD-10-CM | POA: Insufficient documentation

## 2015-10-07 DIAGNOSIS — Z792 Long term (current) use of antibiotics: Secondary | ICD-10-CM | POA: Diagnosis not present

## 2015-10-07 DIAGNOSIS — R079 Chest pain, unspecified: Secondary | ICD-10-CM | POA: Insufficient documentation

## 2015-10-07 DIAGNOSIS — M79602 Pain in left arm: Secondary | ICD-10-CM | POA: Diagnosis not present

## 2015-10-07 DIAGNOSIS — Z862 Personal history of diseases of the blood and blood-forming organs and certain disorders involving the immune mechanism: Secondary | ICD-10-CM | POA: Insufficient documentation

## 2015-10-07 DIAGNOSIS — Z79899 Other long term (current) drug therapy: Secondary | ICD-10-CM | POA: Insufficient documentation

## 2015-10-07 DIAGNOSIS — F329 Major depressive disorder, single episode, unspecified: Secondary | ICD-10-CM | POA: Diagnosis not present

## 2015-10-07 DIAGNOSIS — M791 Myalgia, unspecified site: Secondary | ICD-10-CM

## 2015-10-07 DIAGNOSIS — Z86018 Personal history of other benign neoplasm: Secondary | ICD-10-CM | POA: Insufficient documentation

## 2015-10-07 DIAGNOSIS — M79662 Pain in left lower leg: Secondary | ICD-10-CM | POA: Insufficient documentation

## 2015-10-07 DIAGNOSIS — Z7952 Long term (current) use of systemic steroids: Secondary | ICD-10-CM | POA: Diagnosis not present

## 2015-10-07 LAB — CBC WITH DIFFERENTIAL/PLATELET
BASOS PCT: 0 %
Basophils Absolute: 0 10*3/uL (ref 0.0–0.1)
Eosinophils Absolute: 0.1 10*3/uL (ref 0.0–0.7)
Eosinophils Relative: 2 %
HCT: 35 % — ABNORMAL LOW (ref 36.0–46.0)
Hemoglobin: 12.2 g/dL (ref 12.0–15.0)
LYMPHS ABS: 2.2 10*3/uL (ref 0.7–4.0)
LYMPHS PCT: 38 %
MCH: 29 pg (ref 26.0–34.0)
MCHC: 34.9 g/dL (ref 30.0–36.0)
MCV: 83.3 fL (ref 78.0–100.0)
MONO ABS: 0.6 10*3/uL (ref 0.1–1.0)
MONOS PCT: 11 %
NEUTROS ABS: 2.8 10*3/uL (ref 1.7–7.7)
Neutrophils Relative %: 49 %
PLATELETS: 373 10*3/uL (ref 150–400)
RBC: 4.2 MIL/uL (ref 3.87–5.11)
RDW: 12.6 % (ref 11.5–15.5)
WBC: 5.7 10*3/uL (ref 4.0–10.5)

## 2015-10-07 LAB — COMPREHENSIVE METABOLIC PANEL
ALBUMIN: 3.7 g/dL (ref 3.5–5.0)
ALT: 12 U/L — AB (ref 14–54)
ANION GAP: 7 (ref 5–15)
AST: 11 U/L — ABNORMAL LOW (ref 15–41)
Alkaline Phosphatase: 47 U/L (ref 38–126)
BUN: 10 mg/dL (ref 6–20)
CHLORIDE: 102 mmol/L (ref 101–111)
CO2: 30 mmol/L (ref 22–32)
Calcium: 9.3 mg/dL (ref 8.9–10.3)
Creatinine, Ser: 0.7 mg/dL (ref 0.44–1.00)
GFR calc non Af Amer: >60 mL/min (ref >60)
GLUCOSE: 97 mg/dL (ref 65–99)
Potassium: 3.5 mmol/L (ref 3.5–5.1)
SODIUM: 139 mmol/L (ref 135–145)
Total Bilirubin: 0.4 mg/dL (ref 0.3–1.2)
Total Protein: 7.9 g/dL (ref 6.5–8.1)

## 2015-10-07 LAB — TROPONIN I: Troponin I: <0.03 ng/mL (ref <0.031)

## 2015-10-07 NOTE — ED Notes (Signed)
Back of left leg "on fire" per pts report.

## 2015-10-07 NOTE — ED Provider Notes (Signed)
CSN: [id number]     Arrival date & time 10/07/15  0243 History  By signing my name below, I, [facility], attest that this documentation has been prepared under the direction and in the presence of Orpah Greek, MD. Electronically Signed: Virgel Bouquet, ED Scribe. 10/07/2015. 3:22 AM.     Chief Complaint  Patient presents with  . Leg Pain    left leg  . Arm Pain    Patient is having pain radiating down her arm    The history is provided by the patient. No language interpreter was used.   HPI Comments: Stacy [name] is a 44 y.o. female who presents to the Emergency Department complaining of constant, tingling, left posterior lower leg pain that began today. Per patient, she also complains of diffuse left arm pain, chest pain, and SOB. She was last seen in the ED on 09/29/15 for CP and HA. Patient denies headache currently. Patient has past medical history of GERD.   Past Medical History  Diagnosis Date  . Esophageal stricture   . Chest pain 09/14/2009    no pulmonary embolus by chest CT  . Palpitations   . Elevated BP   . Anxiety     past Hx  . Depression     past Hx  . GERD (gastroesophageal reflux disease)     no meds  . Sickle cell trait ([place])   . Fibroid   . Abnormal Pap smear     cryo, normal since  . Fibroids 09/24/2015   Past Surgical History  Procedure Laterality Date  . Cesarean section  2004  . Laparoscopy for ectopic pregnancy  2001  . Wisdom tooth extraction  2012   Family History  Problem Relation Age of Onset  . Asthma Mother   . Hypertension Mother   . Hypertension Paternal Uncle   . Asthma Maternal Grandmother   . Colon cancer Neg Hx   . Esophageal cancer Neg Hx   . Stomach cancer Neg Hx   . Asthma Daughter   . Thyroid disease Mother   . Heart Problems Mother    Social History  Substance Use Topics  . Smoking status: Never Smoker   . Smokeless tobacco: Never Used  . Alcohol Use: No   OB History    Gravida  Para Term Preterm AB TAB SAB Ectopic Multiple Living   6 2 2  0 3 0 0 3 0 2     Review of Systems  Respiratory: Positive for shortness of breath.   Cardiovascular: Positive for chest pain.  Musculoskeletal: Positive for myalgias (Entire right arm; Posterior lower left leg).  Neurological: Negative for headaches.  All other systems reviewed and are negative.     Allergies  Diphenhydramine hcl; Hydromorphone hcl; and Morphine  Home Medications   Prior to Admission medications   Medication Sig Start Date End Date Taking? Authorizing Provider  acetaminophen (TYLENOL) 325 MG tablet Take 650 mg by mouth every 6 (six) hours as needed for mild pain.   Yes Historical Provider, MD  clonazePAM (KLONOPIN) 0.5 MG tablet Take 1 tablet by mouth 2 (two) times daily as needed. anxiety 10/05/15  Yes Historical Provider, MD  Cyanocobalamin (VITAMIN B-12) 1000 MCG/15ML LIQD Take 5 mLs by mouth daily.   Yes Historical Provider, MD  Dexlansoprazole 30 MG capsule Take 30 mg by mouth daily.   Yes Historical Provider, MD  hydrochlorothiazide (HYDRODIURIL) 12.5 MG tablet Take 1 tablet by mouth daily. 10/05/15  Yes Historical Provider, MD  metroNIDAZOLE (FLAGYL) 500 MG tablet Take 1 tablet (500 mg total) by mouth 2 (two) times daily. 09/25/15  Yes Osborne Oman, MD  amoxicillin-clavulanate (AUGMENTIN) 875-125 MG tablet Take 1 tablet by mouth every 12 (twelve) hours. 09/24/15   Kaitlyn Szekalski, PA-C  escitalopram (LEXAPRO) 10 MG tablet Take 1 tablet (10 mg total) by mouth daily. Patient not taking: Reported on 10/07/2015 09/22/15   Britt Bottom, MD  gabapentin (NEURONTIN) 300 MG capsule Take 1 capsule by mouth 2 (two) times daily. 10/05/15   Historical Provider, MD  ketorolac (TORADOL) 10 MG tablet Take 1 tablet by mouth every 6 (six) hours as needed. pain 10/05/15   Historical Provider, MD  predniSONE (DELTASONE) 50 MG tablet Take 1 tablet by mouth daily. For 5 days 10/05/15   Historical Provider, MD   BP  114/79 mmHg  Pulse 87  Temp(Src) 98.1 F (36.7 C) (Oral)  Resp 17  Ht 5\' 6"  (1.676 m)  Wt 198 lb (89.812 kg)  BMI 31.97 kg/m2  SpO2 98%  LMP 10/06/2015 Physical Exam  Constitutional: She is oriented to person, place, and time. She appears well-developed and well-nourished. No distress.  HENT:  Head: Normocephalic and atraumatic.  Right Ear: Hearing normal.  Left Ear: Hearing normal.  Nose: Nose normal.  Mouth/Throat: Oropharynx is clear and moist and mucous membranes are normal.  Eyes: Conjunctivae and EOM are normal. Pupils are equal, round, and reactive to light.  Neck: Normal range of motion. Neck supple.  Cardiovascular: Normal rate, regular rhythm, S1 normal and S2 normal.  Exam reveals no gallop and no friction rub.   No murmur heard. Pulmonary/Chest: Effort normal and breath sounds normal. No respiratory distress. She exhibits no tenderness.  Abdominal: Soft. Normal appearance and bowel sounds are normal. There is no hepatosplenomegaly. There is no tenderness. There is no rebound, no guarding, no tenderness at McBurney's point and negative Murphy's sign. No hernia.  Musculoskeletal: Normal range of motion. She exhibits tenderness. She exhibits no edema.  Left posterior lower leg tenderness. No calf swelling. Negative Homan sign  Neurological: She is alert and oriented to person, place, and time. She has normal strength. No cranial nerve deficit or sensory deficit. Coordination normal. GCS eye subscore is 4. GCS verbal subscore is 5. GCS motor subscore is 6.  Skin: Skin is warm, dry and intact. No rash noted. No cyanosis.  Psychiatric: She has a normal mood and affect. Her speech is normal and behavior is normal. Thought content normal.  Nursing note and vitals reviewed.   ED Course  Procedures (including critical care time)  DIAGNOSTIC STUDIES: Oxygen Saturation is 98% on RA, normal by my interpretation.    COORDINATION OF CARE: 3:07 AM Will order EKG, chest x-ray, and  labs. Discussed treatment plan with pt at bedside and pt agreed to plan.   Labs Review Labs Reviewed  CBC WITH DIFFERENTIAL/PLATELET - Abnormal; Notable for the following:    HCT 35.0 (*)    All other components within normal limits  COMPREHENSIVE METABOLIC PANEL - Abnormal; Notable for the following:    AST 11 (*)    ALT 12 (*)    All other components within normal limits  TROPONIN I    Imaging Review Dg Chest 2 View  10/07/2015  CLINICAL DATA:  Chest pain EXAM: CHEST  2 VIEW COMPARISON:  Chest CT from 8 days ago FINDINGS: Normal heart size and mediastinal contours. No acute infiltrate or edema. No effusion or pneumothorax. No acute osseous findings. IMPRESSION:  Negative chest. Electronically Signed   By: Monte Fantasia M.D.   On: 10/07/2015 03:32   US Transvaginal Non-ob  10/06/2015  CLINICAL DATA:  Pelvic pain.  Abnormal uterine bleeding. EXAM: TRANSABDOMINAL AND TRANSVAGINAL ULTRASOUND OF PELVIS TECHNIQUE: Both transabdominal and transvaginal ultrasound examinations of the pelvis were performed. Transabdominal technique was performed for global imaging of the pelvis including uterus, ovaries, adnexal regions, and pelvic cul-de-sac. It was necessary to proceed with endovaginal exam following the transabdominal exam to visualize the uterus, endometrium, ovaries and adnexa . COMPARISON:  None FINDINGS: Uterus Measurements: 11.4 x 8.6 x 7.6 cm. Large fundal fibroid measures up to 5.8 cm. Left body fibroid measures up to 4.5 cm. Endometrium Thickness: 8 mm in greatest thickness where visualized. Difficult to visualize in the fundus due to fibroid. No focal abnormality visualized. Right ovary Measurements: 2.8 x 1.0 x 1.1 cm. Normal appearance/no adnexal mass. Left ovary Measurements: 2.1 x 1.0 x 1.0 cm. Normal appearance/no adnexal mass. Other findings No free fluid. IMPRESSION: Fibroid uterus. Electronically Signed   By: Rolm Baptise M.D.   On: 10/06/2015 11:42   US Pelvis  Complete  10/06/2015  CLINICAL DATA:  Pelvic pain.  Abnormal uterine bleeding. EXAM: TRANSABDOMINAL AND TRANSVAGINAL ULTRASOUND OF PELVIS TECHNIQUE: Both transabdominal and transvaginal ultrasound examinations of the pelvis were performed. Transabdominal technique was performed for global imaging of the pelvis including uterus, ovaries, adnexal regions, and pelvic cul-de-sac. It was necessary to proceed with endovaginal exam following the transabdominal exam to visualize the uterus, endometrium, ovaries and adnexa . COMPARISON:  None FINDINGS: Uterus Measurements: 11.4 x 8.6 x 7.6 cm. Large fundal fibroid measures up to 5.8 cm. Left body fibroid measures up to 4.5 cm. Endometrium Thickness: 8 mm in greatest thickness where visualized. Difficult to visualize in the fundus due to fibroid. No focal abnormality visualized. Right ovary Measurements: 2.8 x 1.0 x 1.1 cm. Normal appearance/no adnexal mass. Left ovary Measurements: 2.1 x 1.0 x 1.0 cm. Normal appearance/no adnexal mass. Other findings No free fluid. IMPRESSION: Fibroid uterus. Electronically Signed   By: Rolm Baptise M.D.   On: 10/06/2015 11:42   I have personally reviewed and evaluated these images and lab results as part of my medical decision-making.   EKG Interpretation   Date/Time:  Wednesday October 07 2015 03:08:19 EST Ventricular Rate:  86 PR Interval:  143 QRS Duration: 102 QT Interval:  360 QTC Calculation: 430 R Axis:   59 Text Interpretation:  Sinus rhythm Normal ECG Confirmed by POLLINA  MD,  CHRISTOPHER ([id number]) on 10/07/2015 3:39:49 AM      MDM   Final diagnoses:  None  chest pain Calf pain  Patient presents to the emergency department with complaints of pain in her left arm and her left leg. Pain in the leg is in the posterior aspect of the calf. There is no injury. There is no calf swelling. She has a negative Homans sign. There are no venous cords present. There is nothing to indicate DVT. Patient indicates that  this is a burning pain, unclear etiology. She has normal sensation, normal strength, normal distal pulses. Wells Criteria for DVT = 0.  She also complaining of pain in the left arm with some discomfort in the area of her chest and does feel short of breath. Patient is not hypoxic. Chest x-ray is normal. Troponin is negative. EKG is normal. Symptoms do not appear to be consistent with cardiac etiology. Pain is very atypical. She is felt to be very low risk  for ACS or cardiac chest pain.  Patient reassured, does not appear to be any acute medical condition that requires further treatment at this time. Patient is safe for discharge to follow-up with primary doctor.  I personally performed the services described in this documentation, which was scribed in my presence. The recorded information has been reviewed and is accurate.    Orpah Greek, MD 10/07/15 [phone]

## 2015-10-07 NOTE — Discharge Instructions (Signed)

## 2015-10-07 NOTE — ED Notes (Signed)
Patient states that her doctor put her on hydrochlorothiazide. Today she presents with pain radiating down left leg and left arm.

## 2015-10-08 ENCOUNTER — Ambulatory Visit: Payer: Self-pay | Admitting: Obstetrics & Gynecology

## 2015-10-13 ENCOUNTER — Telehealth (HOSPITAL_COMMUNITY): Payer: Self-pay

## 2015-10-13 NOTE — Telephone Encounter (Signed)
Encounter complete. 

## 2015-10-14 ENCOUNTER — Ambulatory Visit (INDEPENDENT_AMBULATORY_CARE_PROVIDER_SITE_OTHER): Payer: BLUE CROSS/BLUE SHIELD | Admitting: Psychology

## 2015-10-14 DIAGNOSIS — F411 Generalized anxiety disorder: Secondary | ICD-10-CM | POA: Diagnosis not present

## 2015-10-14 DIAGNOSIS — F329 Major depressive disorder, single episode, unspecified: Secondary | ICD-10-CM | POA: Diagnosis not present

## 2015-10-15 ENCOUNTER — Ambulatory Visit (HOSPITAL_COMMUNITY)
Admission: RE | Admit: 2015-10-15 | Discharge: 2015-10-15 | Disposition: A | Discharge status: Home / Self Care | Payer: BLUE CROSS/BLUE SHIELD | Source: Ambulatory Visit | Attending: Cardiology | Admitting: Cardiology

## 2015-10-15 DIAGNOSIS — I10 Essential (primary) hypertension: Secondary | ICD-10-CM | POA: Diagnosis not present

## 2015-10-15 DIAGNOSIS — R079 Chest pain, unspecified: Secondary | ICD-10-CM | POA: Diagnosis present

## 2015-10-15 LAB — EXERCISE TOLERANCE TEST
CHL CUP RESTING HR STRESS: 92 {beats}/min
CSEPED: 6 min
CSEPEDS: 31 s
Estimated workload: 7.7 METS
MPHR: 178 {beats}/min
Peak HR: 157 {beats}/min
Percent HR: 88 %
RPE: 17

## 2015-10-16 ENCOUNTER — Encounter: Payer: Self-pay | Admitting: Neurology

## 2015-10-21 ENCOUNTER — Telehealth: Payer: Self-pay | Admitting: *Deleted

## 2015-10-21 ENCOUNTER — Ambulatory Visit (INDEPENDENT_AMBULATORY_CARE_PROVIDER_SITE_OTHER): Payer: BLUE CROSS/BLUE SHIELD

## 2015-10-21 DIAGNOSIS — R200 Anesthesia of skin: Secondary | ICD-10-CM | POA: Diagnosis not present

## 2015-10-21 DIAGNOSIS — H539 Unspecified visual disturbance: Secondary | ICD-10-CM

## 2015-10-21 NOTE — Telephone Encounter (Signed)
-----   Message from Skeet Latch, MD sent at 10/17/2015  6:57 AM EST ----- Stress test was OK but blood pressure was elevated.  This could be causing chest pain.  Start HCTZ 12.5 mg daily.  Repeat electrolytes in 1 week and 1 month follow up.

## 2015-10-21 NOTE — Telephone Encounter (Signed)
Spoke to patient. Result given . Verbalized understanding Patient states primary started her on HCTZ  12.5 mg about 1 week or so, she has a follow up appointment with primary. RN informed her to continue with instruction from primary Possible purchase a blood pressure cuff - keep a recording an take to visit.she verbalized understanding

## 2015-10-26 ENCOUNTER — Telehealth: Payer: Self-pay | Admitting: *Deleted

## 2015-10-26 ENCOUNTER — Ambulatory Visit (INDEPENDENT_AMBULATORY_CARE_PROVIDER_SITE_OTHER): Payer: BLUE CROSS/BLUE SHIELD | Admitting: Psychology

## 2015-10-26 DIAGNOSIS — F4310 Post-traumatic stress disorder, unspecified: Secondary | ICD-10-CM | POA: Insufficient documentation

## 2015-10-26 DIAGNOSIS — F411 Generalized anxiety disorder: Secondary | ICD-10-CM | POA: Diagnosis not present

## 2015-10-26 DIAGNOSIS — F329 Major depressive disorder, single episode, unspecified: Secondary | ICD-10-CM | POA: Insufficient documentation

## 2015-10-26 NOTE — Telephone Encounter (Signed)
-----   Message from Britt Bottom, MD sent at 10/22/2015 10:47 AM EST ----- Please let her know that the MRI of the brain was normal.

## 2015-10-26 NOTE — Telephone Encounter (Signed)
I have spoken with Stacy [name] this morning and per RAS, advised that mri brain was normal.  She verbalized understanding of same/fim

## 2015-10-27 ENCOUNTER — Other Ambulatory Visit: Payer: Self-pay

## 2015-10-27 ENCOUNTER — Encounter: Payer: Self-pay | Admitting: *Deleted

## 2015-10-27 ENCOUNTER — Emergency Department (HOSPITAL_COMMUNITY)
Admission: EM | Admit: 2015-10-27 | Discharge: 2015-10-27 | Disposition: A | Discharge status: Home / Self Care | Payer: BLUE CROSS/BLUE SHIELD | Attending: Emergency Medicine | Admitting: Emergency Medicine

## 2015-10-27 ENCOUNTER — Ambulatory Visit (INDEPENDENT_AMBULATORY_CARE_PROVIDER_SITE_OTHER): Payer: BLUE CROSS/BLUE SHIELD | Admitting: Obstetrics & Gynecology

## 2015-10-27 ENCOUNTER — Encounter (HOSPITAL_COMMUNITY): Payer: Self-pay | Admitting: Emergency Medicine

## 2015-10-27 ENCOUNTER — Encounter (HOSPITAL_COMMUNITY): Payer: Self-pay | Admitting: *Deleted

## 2015-10-27 ENCOUNTER — Encounter: Payer: Self-pay | Admitting: Obstetrics & Gynecology

## 2015-10-27 ENCOUNTER — Emergency Department (INDEPENDENT_AMBULATORY_CARE_PROVIDER_SITE_OTHER)
Admission: EM | Admit: 2015-10-27 | Discharge: 2015-10-27 | Payer: BLUE CROSS/BLUE SHIELD | Source: Home / Self Care | Attending: Family Medicine | Admitting: Family Medicine

## 2015-10-27 VITALS — BP 119/84 | HR 83 | Resp 16 | Ht 66.0 in | Wt 194.0 lb

## 2015-10-27 DIAGNOSIS — R55 Syncope and collapse: Secondary | ICD-10-CM

## 2015-10-27 DIAGNOSIS — R0602 Shortness of breath: Secondary | ICD-10-CM | POA: Diagnosis not present

## 2015-10-27 DIAGNOSIS — R002 Palpitations: Secondary | ICD-10-CM | POA: Diagnosis not present

## 2015-10-27 DIAGNOSIS — H539 Unspecified visual disturbance: Secondary | ICD-10-CM | POA: Diagnosis not present

## 2015-10-27 DIAGNOSIS — Z79899 Other long term (current) drug therapy: Secondary | ICD-10-CM | POA: Diagnosis not present

## 2015-10-27 DIAGNOSIS — F329 Major depressive disorder, single episode, unspecified: Secondary | ICD-10-CM | POA: Diagnosis not present

## 2015-10-27 DIAGNOSIS — R200 Anesthesia of skin: Secondary | ICD-10-CM | POA: Insufficient documentation

## 2015-10-27 DIAGNOSIS — D251 Intramural leiomyoma of uterus: Secondary | ICD-10-CM | POA: Diagnosis not present

## 2015-10-27 DIAGNOSIS — R51 Headache: Secondary | ICD-10-CM | POA: Diagnosis not present

## 2015-10-27 DIAGNOSIS — Z86018 Personal history of other benign neoplasm: Secondary | ICD-10-CM | POA: Insufficient documentation

## 2015-10-27 DIAGNOSIS — R110 Nausea: Secondary | ICD-10-CM | POA: Insufficient documentation

## 2015-10-27 DIAGNOSIS — N921 Excessive and frequent menstruation with irregular cycle: Secondary | ICD-10-CM | POA: Diagnosis not present

## 2015-10-27 DIAGNOSIS — I4510 Unspecified right bundle-branch block: Secondary | ICD-10-CM | POA: Diagnosis not present

## 2015-10-27 DIAGNOSIS — F419 Anxiety disorder, unspecified: Secondary | ICD-10-CM | POA: Insufficient documentation

## 2015-10-27 DIAGNOSIS — R0981 Nasal congestion: Secondary | ICD-10-CM | POA: Insufficient documentation

## 2015-10-27 DIAGNOSIS — R000 Tachycardia, unspecified: Secondary | ICD-10-CM | POA: Diagnosis not present

## 2015-10-27 DIAGNOSIS — R531 Weakness: Secondary | ICD-10-CM | POA: Insufficient documentation

## 2015-10-27 DIAGNOSIS — K219 Gastro-esophageal reflux disease without esophagitis: Secondary | ICD-10-CM | POA: Insufficient documentation

## 2015-10-27 DIAGNOSIS — Z862 Personal history of diseases of the blood and blood-forming organs and certain disorders involving the immune mechanism: Secondary | ICD-10-CM | POA: Diagnosis not present

## 2015-10-27 LAB — URINALYSIS, ROUTINE W REFLEX MICROSCOPIC
BILIRUBIN URINE: NEGATIVE
GLUCOSE, UA: NEGATIVE mg/dL
Hgb urine dipstick: NEGATIVE
KETONES UR: NEGATIVE mg/dL
Leukocytes, UA: NEGATIVE
NITRITE: NEGATIVE
PH: 7.5 (ref 5.0–8.0)
Protein, ur: NEGATIVE mg/dL
SPECIFIC GRAVITY, URINE: 1.015 (ref 1.005–1.030)

## 2015-10-27 LAB — CBC
HCT: 35.9 % — ABNORMAL LOW (ref 36.0–46.0)
Hemoglobin: 12.5 g/dL (ref 12.0–15.0)
MCH: 28.6 pg (ref 26.0–34.0)
MCHC: 34.8 g/dL (ref 30.0–36.0)
MCV: 82.2 fL (ref 78.0–100.0)
PLATELETS: 334 10*3/uL (ref 150–400)
RBC: 4.37 MIL/uL (ref 3.87–5.11)
RDW: 12.3 % (ref 11.5–15.5)
WBC: 8.3 10*3/uL (ref 4.0–10.5)

## 2015-10-27 LAB — COMPREHENSIVE METABOLIC PANEL
ALK PHOS: 53 U/L (ref 38–126)
ALT: 16 U/L (ref 14–54)
AST: 16 U/L (ref 15–41)
Albumin: 3.6 g/dL (ref 3.5–5.0)
Anion gap: 11 (ref 5–15)
BILIRUBIN TOTAL: 0.2 mg/dL — AB (ref 0.3–1.2)
BUN: 11 mg/dL (ref 6–20)
CALCIUM: 9.1 mg/dL (ref 8.9–10.3)
CO2: 24 mmol/L (ref 22–32)
CREATININE: 0.87 mg/dL (ref 0.44–1.00)
Chloride: 102 mmol/L (ref 101–111)
GFR calc Af Amer: >60 mL/min (ref >60)
GLUCOSE: 107 mg/dL — AB (ref 65–99)
POTASSIUM: 3.4 mmol/L — AB (ref 3.5–5.1)
SODIUM: 137 mmol/L (ref 135–145)
TOTAL PROTEIN: 7.8 g/dL (ref 6.5–8.1)

## 2015-10-27 LAB — I-STAT TROPONIN, ED
TROPONIN I, POC: 0 ng/mL (ref 0.00–0.08)
Troponin i, poc: 0.01 ng/mL (ref 0.00–0.08)

## 2015-10-27 MED ORDER — PANTOPRAZOLE SODIUM 40 MG PO TBEC
40.0000 mg | DELAYED_RELEASE_TABLET | Freq: Once | ORAL | Status: AC
  Administered 2015-10-27: 40 mg via ORAL
  Filled 2015-10-27: qty 1

## 2015-10-27 MED ORDER — CLONAZEPAM 0.5 MG PO TABS
1.0000 mg | ORAL_TABLET | Freq: Once | ORAL | Status: DC
  Filled 2015-10-27: qty 2

## 2015-10-27 MED ORDER — ONDANSETRON 4 MG PO TBDP
4.0000 mg | ORAL_TABLET | Freq: Once | ORAL | Status: AC
  Administered 2015-10-27: 4 mg via ORAL
  Filled 2015-10-27: qty 1

## 2015-10-27 MED ORDER — CLONAZEPAM 0.5 MG PO TABS
0.5000 mg | ORAL_TABLET | Freq: Once | ORAL | Status: AC
  Administered 2015-10-27: 0.5 mg via ORAL

## 2015-10-27 NOTE — ED Notes (Signed)
Pt states she is feeling extremely anxious and her HR increased to 131.

## 2015-10-27 NOTE — Progress Notes (Signed)
Pt is a 44 year old female who presents to address her menomet and fibroids.  Approximately 10 minutes into our conversation and history, patient became acutely tachycardic, light headed, and fel nauseated.    Head was lowered between her legs and cold cloths placed on her neck and head.  Vitals.  BP 159/91 Pulse 126  Prior to symptoms vitals were as follows: Filed Vitals:   10/27/15 1417  BP: 119/84  Pulse: 83  Resp: 16  Height: 5\' 6"  (1.676 m)  Weight: 194 lb (87.998 kg)       Review of Systems  Constitutional: Positive for malaise/fatigue.  Respiratory: Positive for shortness of breath.   Cardiovascular: Positive for palpitations.  Gastrointestinal: Positive for abdominal pain.  Psychiatric/Behavioral: Negative.    Physical Exam  Constitutional: She is oriented to person, place, and time.  HENT:  Head: Normocephalic and atraumatic.  Eyes: Conjunctivae are normal.  Cardiovascular: Regular rhythm.   Pulmonary/Chest: Effort normal.  Abdominal: Soft.  Neurological: She is alert and oriented to person, place, and time.  Skin: Skin is warm. She is diaphoretic.  Psychiatric: Affect and judgment normal.   A/P  44 yo female with fibroids and menomet.   Acute episode of palpitations and lightheadedness stopped exam.  Pt symptoms not resolved after 7 minutes.  Patient transferred to wheel chair and taken to urgent care next door.   Pt needs endometrial biopsy given age and menses q 15-20 days.

## 2015-10-27 NOTE — ED Notes (Addendum)
Pt c/o weakness today x 45 minutes ago. She reports episode of weakness, SOB, near syncope, heart racing and nausea x 15 minutes while in the women's health clinic next door. She reports that she is scheduled for an echo after having an abnormal stress test. Denies CP.

## 2015-10-27 NOTE — ED Notes (Signed)
Pt. reports recurrent palpitations and chest discomfort this evening , denies chest pain , pt. was just discharged here this evening for the same complaints , no SOB , denies nausea or diaphoresis .

## 2015-10-27 NOTE — ED Notes (Signed)
Pt stable, ambulatory, states understanding of discharge instructions 

## 2015-10-27 NOTE — ED Notes (Signed)
Patient called out stating her heart rate was very high on the monitor. This RN in room, patient's heart rate up to 150 on the monitor. EKG shot. Patient's heart rate back down to WDL after 1-2 minutes.

## 2015-10-27 NOTE — ED Notes (Signed)
4-81mg  asa given @3 :30pm Alpha Gula, CMA

## 2015-10-27 NOTE — Discharge Instructions (Signed)
It was my pleasure taking care of you today!   1. Medications: continue usual home medications 2. Treatment: rest, drink plenty of fluids 3. Follow Up: Please follow up with your primary doctor in 3 days for discussion of your diagnoses and further evaluation after today's visit; Please also follow up with your cardiologist; Please return to the ER for any new or worsening symptoms, any additional concerns.

## 2015-10-27 NOTE — ED Provider Notes (Signed)
CSN: [id number]     Arrival date & time 10/27/15  1506 History   First MD Initiated Contact with Patient 10/27/15 1523     Chief Complaint  Patient presents with  . Fatigue  . Hypertension   (Consider location/radiation/quality/duration/timing/severity/associated sxs/prior Treatment) HPI  Pt is a 44yo female brought to [facility] from [facility] across the hall due to sudden onset shortness of breath, palpitations, and lightheadedness. Pt stated she felt like she was about to pass out.  Pt was being seen at [facility] for f/u on fibroids but it was a routine visit.  Pt is not currently pregnant. Pt stated her BP was 119/84 when she initially arrived for appointment. Pt was casually talking to nurse when symptoms started suddenly. Pt was not being stressed at that time.  BP was found to be ~157/97 and heart rate was rapid per palpated pulse.  Pt found to have BP of 146/99 and pulse of 123 in UC.  Pt reports hx of similar symptoms over the last year.  Last similar episode happened at home on Friday, 11/25.  EMS was called but her symptoms subsided with rest and EKG was normal so pt did not go to ED.  Pt also notes having a recent stress EKG on 10/15/15.  She was advised it was normal but her PCP wanted an echocardiogram stress test ordered instead.  Pt now c/o centralized chest pressure.  No aspirin taken PTA.  Pt reports mild nausea. Pt does report hx of anxiety. No hx of blood clots or prior ACS.  Pt is not on any rate control medications.   Past Medical History  Diagnosis Date  . Esophageal stricture   . Chest pain 09/14/2009    no pulmonary embolus by chest CT  . Palpitations   . Elevated BP   . Anxiety     past Hx  . Depression     past Hx  . GERD (gastroesophageal reflux disease)     no meds  . Sickle cell trait ([place])   . Fibroid   . Abnormal Pap smear     cryo, normal since  . Fibroids 09/24/2015   Past Surgical History  Procedure Laterality Date  . Cesarean section  2004  .  Laparoscopy for ectopic pregnancy  2001  . Wisdom tooth extraction  2012   Family History  Problem Relation Age of Onset  . Asthma Mother   . Hypertension Mother   . Hypertension Paternal Uncle   . Asthma Maternal Grandmother   . Colon cancer Neg Hx   . Esophageal cancer Neg Hx   . Stomach cancer Neg Hx   . Asthma Daughter   . Thyroid disease Mother   . Heart Problems Mother    Social History  Substance Use Topics  . Smoking status: Never Smoker   . Smokeless tobacco: Never Used  . Alcohol Use: No   OB History    Gravida Para Term Preterm AB TAB SAB Ectopic Multiple Living   6 2 2  0 3 0 0 3 0 2     Review of Systems  Constitutional: Negative for fever and chills.  Respiratory: Positive for shortness of breath.   Cardiovascular: Positive for chest pain and palpitations. Negative for leg swelling.  Gastrointestinal: Positive for nausea. Negative for vomiting, abdominal pain, diarrhea and constipation.  Neurological: Positive for dizziness, syncope ( near), weakness ( generalized), light-headedness and headaches.  Psychiatric/Behavioral: Negative for sleep disturbance. The patient is nervous/anxious.     Allergies  Hydromorphone; Diphenhydramine hcl; Hydromorphone hcl; Morphine; and Diphenhydramine  Home Medications   Prior to Admission medications   Medication Sig Start Date End Date Taking? Authorizing Provider  acetaminophen (TYLENOL) 325 MG tablet Take 650 mg by mouth every 6 (six) hours as needed for mild pain.    Historical Provider, MD  clonazePAM (KLONOPIN) 0.5 MG tablet Take 1 tablet by mouth 2 (two) times daily as needed. anxiety 10/05/15   Historical Provider, MD  Cyanocobalamin (VITAMIN B-12) 1000 MCG/15ML LIQD Take 5 mLs by mouth daily.    Historical Provider, MD  Dexlansoprazole 30 MG capsule Take 30 mg by mouth daily.    Historical Provider, MD  hydrochlorothiazide (HYDRODIURIL) 12.5 MG tablet Take 1 tablet by mouth daily. 10/05/15   Historical Provider, MD   LOTEMAX 0.5 % GEL  10/13/15   Historical Provider, MD  sertraline (ZOLOFT) 50 MG tablet Take 1/2 tablet daily x 7 days, then one tablet daily. 10/26/15   Historical Provider, MD  Vitamin D, Ergocalciferol, (DRISDOL) 50000 UNITS CAPS capsule Frequency:   Dosage:0   UNIT  Instructions:  Note:TAKE 1 CAPSULE WEEKLY. 03/23/10   Historical Provider, MD   Meds Ordered and Administered this Visit  Medications - No data to display  BP 146/99 mmHg  Pulse 123  Temp(Src) 98.1 F (36.7 C) (Oral)  Resp 18  SpO2 99%  LMP 10/06/2015 No data found.   Physical Exam  Constitutional: She appears well-developed and well-nourished. No distress.  HENT:  Head: Normocephalic and atraumatic.  Eyes: Conjunctivae are normal. No scleral icterus.  Neck: Normal range of motion.  Cardiovascular: Regular rhythm and normal heart sounds.  Tachycardia present.   Tachycardia, regular rhythm  Pulmonary/Chest: Effort normal and breath sounds normal. No respiratory distress. She has no wheezes. She has no rales. She exhibits no tenderness.  Abdominal: Soft. Bowel sounds are normal. She exhibits no distension and no mass. There is no tenderness. There is no rebound and no guarding.  Musculoskeletal: Normal range of motion.  Neurological: She is alert.  Skin: Skin is warm and dry. She is not diaphoretic.  Psychiatric: Her mood appears anxious. Her affect is not angry. Her speech is not slurred. She does not exhibit a depressed mood.  Nursing note and vitals reviewed.   ED Course  Procedures (including critical care time)  Labs Review Labs Reviewed - No data to display  Imaging Review No results found.  EKG: tachycardia, Right bundle branch block.   MDM   1. Near syncope   2. Palpitations   3. Tachycardia   4. Right bundle branch block     Pt sent from across the hall at [facility] health for evaluation of near syncope episode with associated palpitations, shortness of breath and centralized chest  discomfort.   Hx of similar symptoms. Currently being followed by her PCP and cardiology.  Pt being scheduled for an echocardiogram stress test as regular stress EKG did not reveal cause of symptoms per PCP.  Pt not currently on heart rate control medications.   EKG: tachycardia, possible new right bundle branch block. Pt given 325mg  aspirin in UC. Advised pt she needed to go to emergency department for further workup and monitoring. Pt agreeable to having EMS called as it is unsafe for her to go POV as pt still feels lightheaded   EMS arrived, plan to transport pt to [facility] Emergency Department for further evaluation. Pt notified her family where to meet her. Pt left in stable condition.  Noland Fordyce, PA-C 10/27/15 [phone]

## 2015-10-27 NOTE — ED Provider Notes (Signed)
CSN: [id number]     Arrival date & time 10/27/15  1628 History   First MD Initiated Contact with Patient 10/27/15 1706     Chief Complaint  Patient presents with  . Near Syncope     (Consider location/radiation/quality/duration/timing/severity/associated sxs/prior Treatment) HPI  Stacy [name] is a 44 y.o. female  with a PMH of GERD, anxiety, depression, fibroids, and sickle cell trait who presents to the Emergency Department complaining of palpitations. Pt. Was at her gyn appointment earlier today when she felt like she was going to faint - states that her heart rate went into the 140's and BP increased as well. She could feel the palpitations, and had associated nausea and sob. This has occurred several times in the past 3-4 months, often at night while in bed and usually when she is at rest. Pt. Is followed by cardiology and neurology, but states no one has been able to give her a clear answer for the tachycardia. Echo scheduled for Friday. Per patient, stress test did not show anything of concern. Recently put on klonopin, and HCTZ.  Past Medical History  Diagnosis Date  . Esophageal stricture   . Chest pain 09/14/2009    no pulmonary embolus by chest CT  . Palpitations   . Elevated BP   . Anxiety     past Hx  . Depression     past Hx  . GERD (gastroesophageal reflux disease)     no meds  . Sickle cell trait (Stacy [name])   . Fibroid   . Abnormal Pap smear     cryo, normal since  . Fibroids 09/24/2015   Past Surgical History  Procedure Laterality Date  . Cesarean section  2004  . Laparoscopy for ectopic pregnancy  2001  . Wisdom tooth extraction  2012   Family History  Problem Relation Age of Onset  . Asthma Mother   . Hypertension Mother   . Hypertension Paternal Uncle   . Asthma Maternal Grandmother   . Colon cancer Neg Hx   . Esophageal cancer Neg Hx   . Stomach cancer Neg Hx   . Asthma Daughter   . Thyroid disease Mother   . Heart Problems Mother     Social History  Substance Use Topics  . Smoking status: Never Smoker   . Smokeless tobacco: Never Used  . Alcohol Use: No   OB History    Gravida Para Term Preterm AB TAB SAB Ectopic Multiple Living   6 2 2  0 3 0 0 3 0 2     Review of Systems  Constitutional: Positive for fatigue. Negative for fever, chills, diaphoresis, appetite change and unexpected weight change.  HENT: Positive for congestion. Negative for rhinorrhea, sinus pressure and sore throat.   Eyes: Positive for visual disturbance.  Respiratory: Positive for shortness of breath. Negative for cough and wheezing.   Cardiovascular: Positive for palpitations. Negative for chest pain and leg swelling.  Gastrointestinal: Positive for nausea. Negative for vomiting, abdominal pain, diarrhea and constipation.  Endocrine: Negative for polydipsia and polyuria.  Musculoskeletal: Negative for myalgias, back pain, arthralgias and neck pain.  Skin: Negative for rash.  Neurological: Positive for weakness, numbness and headaches. Negative for dizziness.      Allergies  Hydromorphone; Diphenhydramine hcl; Hydromorphone hcl; Morphine; and Diphenhydramine  Home Medications   Prior to Admission medications   Medication Sig Start Date End Date Taking? Authorizing Provider  acetaminophen (TYLENOL) 325 MG tablet Take 650 mg by mouth every 6 (six) hours  as needed for mild pain.    Historical Provider, MD  clonazePAM (KLONOPIN) 0.5 MG tablet Take 1 tablet by mouth 2 (two) times daily as needed. anxiety 10/05/15   Historical Provider, MD  Cyanocobalamin (VITAMIN B-12) 1000 MCG/15ML LIQD Take 5 mLs by mouth daily.    Historical Provider, MD  Dexlansoprazole 30 MG capsule Take 30 mg by mouth daily.    Historical Provider, MD  hydrochlorothiazide (HYDRODIURIL) 12.5 MG tablet Take 1 tablet by mouth daily. 10/05/15   Historical Provider, MD  LOTEMAX 0.5 % GEL  10/13/15   Historical Provider, MD  sertraline (ZOLOFT) 50 MG tablet Take 1/2 tablet  daily x 7 days, then one tablet daily. 10/26/15   Historical Provider, MD  Vitamin D, Ergocalciferol, (DRISDOL) 50000 UNITS CAPS capsule Frequency:   Dosage:0   UNIT  Instructions:  Note:TAKE 1 CAPSULE WEEKLY. 03/23/10   Historical Provider, MD   BP 114/85 mmHg  Pulse 88  Temp(Src) 98.6 F (37 C) (Oral)  Resp 14  Ht 5\' 6"  (1.676 m)  Wt 87.091 kg  BMI 31.00 kg/m2  SpO2 98%  LMP 10/06/2015 Physical Exam  Constitutional: She is oriented to person, place, and time. She appears well-developed and well-nourished.  Anxious, slightly tearful, but in no acute distress  HENT:  Head: Normocephalic and atraumatic.  Cardiovascular: Normal rate, regular rhythm, normal heart sounds and intact distal pulses.  Exam reveals no gallop and no friction rub.   No murmur heard. Pulmonary/Chest: Effort normal and breath sounds normal. No respiratory distress. She has no wheezes. She has no rales. She exhibits no tenderness.  Abdominal: She exhibits no mass. There is no rebound and no guarding.  Abdomen soft, non-tender, non-distended Bowel sounds positive in all four quadrants  Musculoskeletal: She exhibits no edema.  Neurological: She is alert and oriented to person, place, and time.  Alert, oriented, thought content appropriate, able to give a coherent history. Speech is clear and goal oriented, able to follow commands.   Cranial Nerves:  II:  Peripheral visual fields grossly normal, pupils equal, round, reactive to light III, IV, VI: EOM intact bilaterally, ptosis not present V,VII: smile symmetric, eyes kept closed tightly against resistance, facial light touch sensation equal VIII: hearing grossly normal IX, X: symmetric soft palate movement, uvula elevates symmetrically  XI: bilateral shoulder shrug symmetric and strong XII: midline tongue extension  5/5 muscle strength in upper and lower extremities bilaterally including strong and equal grip strength and dorsiflexion/plantar flexion Sensory to  light touch normal in all four extremities.  Normal finger-to-nose and rapid alternating movements  Skin: Skin is warm and dry. No rash noted.  Psychiatric: She has a normal mood and affect. Her behavior is normal. Judgment and thought content normal.  Nursing note and vitals reviewed.   ED Course  Procedures (including critical care time) Labs Review Labs Reviewed  CBC - Abnormal; Notable for the following:    HCT 35.9 (*)    All other components within normal limits  COMPREHENSIVE METABOLIC PANEL - Abnormal; Notable for the following:    Potassium 3.4 (*)    Glucose, Bld 107 (*)    Total Bilirubin 0.2 (*)    All other components within normal limits  URINALYSIS, ROUTINE W REFLEX MICROSCOPIC (NOT AT [facility])  I-STAT TROPOININ, ED    Imaging Review No results found. I have personally reviewed and evaluated these images and lab results as part of my medical decision-making.   EKG Interpretation   Date/Time:  Tuesday October 27 2015 19:01:19 EST Ventricular Rate:  109 PR Interval:  139 QRS Duration: 104 QT Interval:  314 QTC Calculation: 423 R Axis:   23 Text Interpretation:  Sinus tachycardia RSR' in V1 or V2, right VCD or RVH  Confirmed by Christy Gentles  MD, DONALD ([id number]) on 10/27/2015 7:16:17 PM      MDM   Final diagnoses:  Tachycardia  PALPITATIONS, RECURRENT   Vianney B Payne-Setterlund presents with palpitations and assoc. Sob that has occurred intermittently for months. Followed by neurology, cardiology, and PCP. Will be seeing rheumatology in near future.   When examining patient, HR was initially in 100-105, she stated she did not feel well - sat up - and heart rate rose to 146 over ~ 1 min. Episodes lasted ~ 3 minutes, then heart rate gradually fell back to 110's. States this is what usually occurs. She has worn holter monitor, had stress test. Trop was 0.0. EKG reassuring.   I explained the diagnosis and have given precautions as to when/if to return to the ER  including for any other new or worsening symptoms. Informed patient to please follow-up with cardiology and keep scheduled appointment for echo.The patient understands and accepts the medical plan as it's been dictated and I have answered their questions. Discharge instructions concerning home care and follow-up have been given. The patient is stable and is discharged to home in good condition.  Patient seen by and discussed with Dr. Christy Gentles who agrees with treatment plan.   [facility] Zaheer Wageman, PA-C 10/27/15 2003  Ripley Fraise, MD 10/29/15 1150

## 2015-10-27 NOTE — ED Notes (Signed)
Pt states she was at her gyn apt and felt like she was going to pass out. Hr rate increased to 140's and BP increased. Denies LOC.  Pt states this has been going on for about 3 months and has seen several doctors.  Pt states she feels her heart start to beat fast and she feels panicked.  Pt states her doctor is going to do tests to rules out auto immune disease.

## 2015-10-27 NOTE — ED Notes (Signed)
Pt ambulated with stand by assist in a safe and steady manner.

## 2015-10-28 ENCOUNTER — Ambulatory Visit (INDEPENDENT_AMBULATORY_CARE_PROVIDER_SITE_OTHER): Payer: BLUE CROSS/BLUE SHIELD | Admitting: Cardiology

## 2015-10-28 ENCOUNTER — Encounter: Payer: Self-pay | Admitting: Cardiology

## 2015-10-28 ENCOUNTER — Telehealth: Payer: Self-pay | Admitting: Cardiovascular Disease

## 2015-10-28 ENCOUNTER — Emergency Department (HOSPITAL_COMMUNITY)
Admission: EM | Admit: 2015-10-28 | Discharge: 2015-10-28 | Disposition: A | Discharge status: Home / Self Care | Payer: BLUE CROSS/BLUE SHIELD | Source: Home / Self Care | Attending: Emergency Medicine | Admitting: Emergency Medicine

## 2015-10-28 VITALS — BP 134/90 | HR 108 | Ht 66.0 in | Wt 194.0 lb

## 2015-10-28 DIAGNOSIS — R002 Palpitations: Secondary | ICD-10-CM

## 2015-10-28 MED ORDER — POTASSIUM CHLORIDE ER 10 MEQ PO TBCR: 10.0000 meq | EXTENDED_RELEASE_TABLET | Freq: Every day | ORAL | Status: DC

## 2015-10-28 MED ORDER — HYDROCHLOROTHIAZIDE 12.5 MG PO TABS: 12.5000 mg | ORAL_TABLET | Freq: Every day | ORAL | Status: DC

## 2015-10-28 MED ORDER — METOPROLOL SUCCINATE ER 25 MG PO TB24: 25.0000 mg | ORAL_TABLET | Freq: Every day | ORAL | Status: DC

## 2015-10-28 MED ORDER — METOPROLOL SUCCINATE ER 25 MG PO TB24
25.0000 mg | ORAL_TABLET | Freq: Once | ORAL | Status: DC
  Filled 2015-10-28: qty 1

## 2015-10-28 NOTE — Progress Notes (Signed)
10/28/2015 Stacy [name]   07-06-1971  [id number]  Primary Physician Benito Mccreedy, MD Primary Cardiologist: Dr. Oval Linsey   Reason for Visit/CC: Palpitations, HTN  HPI:  The patient is a 44 y.o. female, followed by Dr. Oval Linsey, with hypertension, obesity, anxiety and depression. She was recently referred to Dr. Oval Linsey for evaluation for chest pain and dyspnea. She was seen by Dr. Oval Linsey in clinic on 09/16/15. This was after presentation to the ED with same complaint. Per records, cardiac enzymes and EKG were unremarkable.D-dimer was abnormal, however chest CT was negative for PE and LE ultrasound was negative for DVT. Dr. Oval Linsey ordered for her to be further evaluated with an exercise tolerance test as well as a cardiac event monitor. She also ordered a split night study to r/o OSA as she had also endorsed frequent daytime fatigue. Her ETT was negative for ischemia. She was noted to be hypertensive prior to stress test with BP of 186/102. Dr. Oval Linsey felt that her HTN could be the cause of her CP, thus she recommended starting HCTZ, 12.5 mg daily. Her cardiac monitor showed normal sinus ryhthm w/o any significant arrthymias. Sleep study showed no significant sleep apnea.   Since her last OV, she has presented back to the [facility] ED on 2 separate occasions for recurrent tachypalpitations and near syncope. She has also continued to have issues with hypertension, despite full compliance with HCTZ. She notes home readings into the A999333 systolic and up to 123XX123 diastolic. She has avoided caffeine. She has cut out salt intake. She denies alcohol intake. She does admit to a lot of stress/ increased anxiety given her overall situation.  She was last seen in the [facility] ED yesterday with the above complaints. CBC was normal w/o anemia. WBC was normal. BMP was remarkable for mild hypokalemia with K at 3.4. She is not on any potassium for supplementation with HCTZ. Renal function was normal.  She recently had a TSH 4 weeks ago that was also normal. In the ED, she was given a Rx for 25 mg of metoprolol but has not yet taken a full dose. She wanted to discuss with [place] prior to taking. She also notes that her PCP recently ordered for her to get a 2D echo, which is scheduled for 10/30/15.  EKG today shows sinus tachycardia with a HR of 108 bpm. BP is 136/90.      Current Outpatient Prescriptions  Medication Sig Dispense Refill  . aspirin 81 MG tablet Take 81 mg by mouth daily.    . clonazePAM (KLONOPIN) 0.5 MG tablet Take 1 tablet by mouth 2 (two) times daily as needed. anxiety  0  . Cyanocobalamin (VITAMIN B-12) 1000 MCG/15ML LIQD Take 5 mLs by mouth daily.    . famotidine (PEPCID) 20 MG tablet Take 20 mg by mouth daily as needed for heartburn or indigestion.    . ferrous sulfate 324 (65 FE) MG TBEC Take 1 tablet by mouth daily.    . hydrochlorothiazide (HYDRODIURIL) 12.5 MG tablet Take 1 tablet (12.5 mg total) by mouth daily. 30 tablet 6  . metoprolol succinate (TOPROL-XL) 25 MG 24 hr tablet Take 1 tablet (25 mg total) by mouth daily. 10 tablet 0  . Vitamin D, Ergocalciferol, (DRISDOL) 50000 UNITS CAPS capsule TAKE 1 CAPSULE BY MOUTH WEEKLY.    Marland Kitchen azithromycin (ZITHROMAX) 250 MG tablet   0  . fluticasone (FLONASE) 50 MCG/ACT nasal spray   0  . potassium chloride (K-DUR) 10 MEQ tablet Take 1 tablet (10 mEq  total) by mouth daily. 90 tablet 3   No current facility-administered medications for this visit.    Allergies  Allergen Reactions  . Hydromorphone Shortness Of Breath  . Diphenhydramine Hcl Hives and Nausea Only  . Hydromorphone Hcl Nausea And Vomiting  . Morphine Nausea And Vomiting  . Diphenhydramine Hives    Social History   Social History  . Marital Status: Legally Separated    Spouse Name: N/A  . Number of Children: 2  . Years of Education: N/A   Occupational History  .  [employer]   Social History Main Topics  . Smoking status: Never Smoker   . Smokeless  tobacco: Never Used  . Alcohol Use: No  . Drug Use: No  . Sexual Activity:    Partners: Male   Other Topics Concern  . Not on file   Social History Narrative   Epworth Sleepiness Scale = 5 (as of 09/16/2015)     Review of Systems: General: negative for chills, fever, night sweats or weight changes.  Cardiovascular: negative for chest pain, dyspnea on exertion, edema, orthopnea, palpitations, paroxysmal nocturnal dyspnea or shortness of breath Dermatological: negative for rash Respiratory: negative for cough or wheezing Urologic: negative for hematuria Abdominal: negative for nausea, vomiting, diarrhea, bright red blood per rectum, melena, or hematemesis Neurologic: negative for visual changes, syncope, or dizziness All other systems reviewed and are otherwise negative except as noted above.    Blood pressure 134/90, pulse 108, height 5\' 6"  (1.676 m), weight 194 lb (87.998 kg), last menstrual period 10/06/2015, unknown if currently breastfeeding.  General appearance: alert, cooperative and no distress Neck: no carotid bruit and no JVD Lungs: clear to auscultation bilaterally Heart: regular ryhthm, tachy rate, no murmurs Abdomen: soft, non-tender; bowel sounds normal; no masses,  no organomegaly Extremities: no LEE Pulses: 2+ and symmetric Skin: warm and dry Neurologic: Grossly normal  EKG sinus tachycardia. HR 108. No ischemia  ASSESSMENT AND PLAN:   1. Palpitations/Sinus Tachycardia: w/u thus far including stress test, event monitor and TSH has been unremarkable. CBC normal w/o anemia or leukocytosis. She is mildly hypokalemic with K at 3.4. This is in the setting of thiazide diuretic use for HTN. Will supplement with low dose K-dur. Agree with PCP on 2D echo. This is scheduled for 10/30/15. Patient notified to have PCP route results to our office for review. I also agree on ED physician's recommendations regarding BB therapy with metoprolol. This  will help reduce HR, thus  reducing palpitations and will help with additional BP coverage.   2. HTN: 136/90 in clinic today, but patient reports readings at home in the A999333 systolic and up to 123XX123 diastolic. Continue HCTZ and start 25 mg of metoprolol daily.   3. Hypokalemia: She is mildly hypokalemic with K at 3.4. This is in the setting of thiazide diuretic use for HTN. Will supplement with low dose K-dur. 10 mEq daily. Repeat BMP in 1 week.  4. Anxiety: suspect this may be the main cause of her symptoms, elevated HR and BP. Continue PRN klonopin per PCP. Add metoprolol as outlined above.   PLAN  F/u in 1-2 weeks to re-view echo results and assess HR and BP response to metoprolol. Recheck BMP in 1 week to reassess potassium.   Lyda Jester PA-C 10/28/2015 12:17 PM

## 2015-10-28 NOTE — Telephone Encounter (Signed)
Calling because she had to go to the E/R twice on yesterday because her bp and heart rate went up . .. Was told to come in to day to see someone. Please call   thanks

## 2015-10-28 NOTE — ED Provider Notes (Signed)
CSN: [id number]     Arrival date & time 10/27/15  2254 History  By signing my name below, I, Evelene Croon, attest that this documentation has been prepared under the direction and in the presence of Everlene Balls, MD . Electronically Signed: Evelene Croon, Scribe. 10/28/2015. 1:25 AM.  Chief Complaint  Patient presents with  . Palpitations   The history is provided by the patient. No language interpreter was used.    HPI Comments:  Stacy [name] is a 44 y.o. female with a history of anxiety and palpitations, who presents to the Emergency Department complaining of palpitations and fatigue.She has been experiencing the same for ~ 3 months. She describes her symptom as her "heart wanting to jump out of her chest".She denies CP. Pt notes associated nausea with today's episode. She was evaluated in ED on 11/29 for same and discharged to follow up with cardiology. Pt has been been followed by cardiology and PCP since symptom began and has had a stress test, sleep study and has worn a halter monitor. Pt has appointment to have echocardiogram on 10/30/15.  No alleviating factors noted. She denies h/o blood clots. Pt notes she was recently placed on HCTZ.   Past Medical History  Diagnosis Date  . Esophageal stricture   . Chest pain 09/14/2009    no pulmonary embolus by chest CT  . Palpitations   . Elevated BP   . Anxiety     past Hx  . Depression     past Hx  . GERD (gastroesophageal reflux disease)     no meds  . Sickle cell trait ([place])   . Fibroid   . Abnormal Pap smear     cryo, normal since  . Fibroids 09/24/2015   Past Surgical History  Procedure Laterality Date  . Cesarean section  2004  . Laparoscopy for ectopic pregnancy  2001  . Wisdom tooth extraction  2012   Family History  Problem Relation Age of Onset  . Asthma Mother   . Hypertension Mother   . Hypertension Paternal Uncle   . Asthma Maternal Grandmother   . Colon cancer Neg Hx   . Esophageal cancer Neg Hx    . Stomach cancer Neg Hx   . Asthma Daughter   . Thyroid disease Mother   . Heart Problems Mother    Social History  Substance Use Topics  . Smoking status: Never Smoker   . Smokeless tobacco: Never Used  . Alcohol Use: No   OB History    Gravida Para Term Preterm AB TAB SAB Ectopic Multiple Living   6 2 2  0 3 0 0 3 0 2     Review of Systems  10 systems reviewed and all are negative for acute change except as noted in the HPI.  Allergies  Hydromorphone; Diphenhydramine hcl; Hydromorphone hcl; Morphine; and Diphenhydramine  Home Medications   Prior to Admission medications   Medication Sig Start Date End Date Taking? Authorizing Provider  acetaminophen (TYLENOL) 325 MG tablet Take 650 mg by mouth every 6 (six) hours as needed for mild pain.    Historical Provider, MD  clonazePAM (KLONOPIN) 0.5 MG tablet Take 1 tablet by mouth 2 (two) times daily as needed. anxiety 10/05/15   Historical Provider, MD  Cyanocobalamin (VITAMIN B-12) 1000 MCG/15ML LIQD Take 5 mLs by mouth daily.    Historical Provider, MD  Dexlansoprazole 30 MG capsule Take 30 mg by mouth daily.    Historical Provider, MD  hydrochlorothiazide (HYDRODIURIL) 12.5  MG tablet Take 1 tablet by mouth daily. 10/05/15   Historical Provider, MD  LOTEMAX 0.5 % GEL  10/13/15   Historical Provider, MD  sertraline (ZOLOFT) 50 MG tablet Take 1/2 tablet daily x 7 days, then one tablet daily. 10/26/15   Historical Provider, MD  Vitamin D, Ergocalciferol, (DRISDOL) 50000 UNITS CAPS capsule Frequency:   Dosage:0   UNIT  Instructions:  Note:TAKE 1 CAPSULE WEEKLY. 03/23/10   Historical Provider, MD   BP 117/82 mmHg  Pulse 79  Temp(Src) 98.3 F (36.8 C) (Oral)  Resp 19  Ht 5\' 6"  (1.676 m)  Wt 194 lb 4.8 oz (88.134 kg)  BMI 31.38 kg/m2  SpO2 97%  LMP 10/06/2015 Physical Exam  Constitutional: She is oriented to person, place, and time. She appears well-developed and well-nourished. No distress.  HENT:  Head: Normocephalic and  atraumatic.  Nose: Nose normal.  Mouth/Throat: Oropharynx is clear and moist. No oropharyngeal exudate.  Eyes: Conjunctivae and EOM are normal. Pupils are equal, round, and reactive to light. No scleral icterus.  Neck: Normal range of motion. Neck supple. No JVD present. No tracheal deviation present. No thyromegaly present.  Cardiovascular: Normal rate, regular rhythm and normal heart sounds.  Exam reveals no gallop and no friction rub.   No murmur heard. Pulmonary/Chest: Effort normal and breath sounds normal. No respiratory distress. She has no wheezes. She exhibits no tenderness.  Abdominal: Soft. Bowel sounds are normal. She exhibits no distension and no mass. There is no tenderness. There is no rebound and no guarding.  Musculoskeletal: Normal range of motion. She exhibits no edema or tenderness.  Lymphadenopathy:    She has no cervical adenopathy.  Neurological: She is alert and oriented to person, place, and time. No cranial nerve deficit. She exhibits normal muscle tone.  Skin: Skin is warm and dry. No rash noted. No erythema. No pallor.  Nursing note and vitals reviewed.   ED Course  Procedures   DIAGNOSTIC STUDIES:  Oxygen Saturation is 98% on RA, normal by my interpretation.    COORDINATION OF CARE:  1:14 AM Discussed treatment plan with pt at bedside and pt agreed to plan.  Labs Review Labs Reviewed  Randolm Idol, ED    Imaging Review No results found. I have personally reviewed and evaluated these images and lab results as part of my medical decision-making.   EKG Interpretation   Date/Time:  Tuesday October 27 2015 23:04:47 EST Ventricular Rate:  89 PR Interval:  146 QRS Duration: 98 QT Interval:  348 QTC Calculation: 423 R Axis:   53 Text Interpretation:  Normal sinus rhythm Incomplete right bundle branch  block Septal infarct , age undetermined Abnormal ECG tachycardia now  resolved Confirmed by Glynn Octave [id number]) on 10/28/2015  12:36:07  AM      MDM   Final diagnoses:  None     Patient presents emergency department for recurrent palpitations and chest discomfort. Repeat troponin is 0. She also had troponins taken earlier today during her prior emergency department visit. Her EKG is unchanged. Single and on for the last 3 months and she has good cardiology follow-up. She has an echo scheduled for this Friday. Will place patient on low-dose metoprolol to see if her symptoms improved. She otherwise appears well and in no acute distress,.any emergent medical condition presently. Vital signs were within her normal limits, there is no evidence of tachycardia during this emergency department visit. She is safe for discharge.  I personally performed the services described in  this documentation, which was scribed in my presence. The recorded information has been reviewed and is accurate.      Everlene Balls, MD 10/28/15 0130

## 2015-10-28 NOTE — Discharge Instructions (Signed)
Palpitations Stacy [name], her blood work today does not show any damage done to your heart. Take medication daily to help with your symptoms and see your cardiologist within 3 days for close follow-up. If any symptoms worsen come back to emergency department immediately. Thank you. A palpitation is the feeling that your heartbeat is irregular. It may feel like your heart is fluttering or skipping a beat. It may also feel like your heart is beating faster than normal. This is usually not a serious problem. In some cases, you may need more medical tests. HOME CARE  Avoid:  Caffeine in coffee, tea, soft drinks, diet pills, and energy drinks.  Chocolate.  Alcohol.  Stop smoking if you smoke.  Reduce your stress and anxiety. Try:  A method that measures bodily functions so you can learn to control them (biofeedback).  Yoga.  Meditation.  Physical activity such as swimming, jogging, or walking.  Get plenty of rest and sleep. GET HELP IF:  Your fast or irregular heartbeat continues after 24 hours.  Your palpitations occur more often. GET HELP RIGHT AWAY IF:   You have chest pain.  You feel short of breath.  You have a very bad headache.  You feel dizzy or pass out (faint). MAKE SURE YOU:   Understand these instructions.  Will watch your condition.  Will get help right away if you are not doing well or get worse.   This information is not intended to replace advice given to you by your health care provider. Make sure you discuss any questions you have with your health care provider.   Document Released: 08/23/2008 Document Revised: 12/05/2014 Document Reviewed: 01/13/2012 Elsevier Interactive Patient Education [employer].

## 2015-10-28 NOTE — Patient Instructions (Signed)
Medication Instructions:  Your physician has recommended you make the following change in your medication:  1.  START the Potassium 10 meq taking 1 tablet daily   Labwork: NONE ORDERED  Testing/Procedures: NONE ORDERED  Follow-Up: Your physician recommends that you schedule a follow-up appointment in: 1 WEEK FOR REPEAT EKG, BLOOD PRESSURE CHECK AND BMP WITH BRITTAINY SIMMONS, PA-C OR 1ST AVAILABLE EXTENDER (FLEX IS FINE IF THAT'S ONLY OPTION)

## 2015-10-28 NOTE — Telephone Encounter (Signed)
Returned call to patient she stated she was taken to [facility] ER twice yesterday with rapid heart beat and elevated B/P.Stated heart rate was 170, B/P 160/110.Stated she was told to see cardiologist today.Stated she feels weak this morning,no fast heart beat.Appointment scheduled with Mare Loan PA today at 11:30 am at [facility] arrive at 11:15 am.

## 2015-10-29 ENCOUNTER — Emergency Department (HOSPITAL_COMMUNITY): Payer: BLUE CROSS/BLUE SHIELD

## 2015-10-29 ENCOUNTER — Other Ambulatory Visit: Payer: Self-pay

## 2015-10-29 ENCOUNTER — Emergency Department (HOSPITAL_COMMUNITY)
Admission: EM | Admit: 2015-10-29 | Discharge: 2015-10-29 | Disposition: A | Discharge status: Home / Self Care | Payer: BLUE CROSS/BLUE SHIELD | Attending: Emergency Medicine | Admitting: Emergency Medicine

## 2015-10-29 ENCOUNTER — Encounter (HOSPITAL_COMMUNITY): Payer: Self-pay | Admitting: Emergency Medicine

## 2015-10-29 DIAGNOSIS — R002 Palpitations: Secondary | ICD-10-CM | POA: Diagnosis present

## 2015-10-29 DIAGNOSIS — Z8719 Personal history of other diseases of the digestive system: Secondary | ICD-10-CM | POA: Insufficient documentation

## 2015-10-29 DIAGNOSIS — R42 Dizziness and giddiness: Secondary | ICD-10-CM | POA: Insufficient documentation

## 2015-10-29 DIAGNOSIS — F419 Anxiety disorder, unspecified: Secondary | ICD-10-CM | POA: Insufficient documentation

## 2015-10-29 DIAGNOSIS — R0602 Shortness of breath: Secondary | ICD-10-CM | POA: Diagnosis not present

## 2015-10-29 DIAGNOSIS — Z7982 Long term (current) use of aspirin: Secondary | ICD-10-CM | POA: Diagnosis not present

## 2015-10-29 DIAGNOSIS — D573 Sickle-cell trait: Secondary | ICD-10-CM | POA: Insufficient documentation

## 2015-10-29 DIAGNOSIS — Z86018 Personal history of other benign neoplasm: Secondary | ICD-10-CM | POA: Insufficient documentation

## 2015-10-29 DIAGNOSIS — F329 Major depressive disorder, single episode, unspecified: Secondary | ICD-10-CM | POA: Insufficient documentation

## 2015-10-29 DIAGNOSIS — Z79899 Other long term (current) drug therapy: Secondary | ICD-10-CM | POA: Diagnosis not present

## 2015-10-29 DIAGNOSIS — R079 Chest pain, unspecified: Secondary | ICD-10-CM

## 2015-10-29 LAB — CBC
HCT: 35.3 % — ABNORMAL LOW (ref 36.0–46.0)
Hemoglobin: 12.2 g/dL (ref 12.0–15.0)
MCH: 28.4 pg (ref 26.0–34.0)
MCHC: 34.6 g/dL (ref 30.0–36.0)
MCV: 82.1 fL (ref 78.0–100.0)
PLATELETS: 323 10*3/uL (ref 150–400)
RBC: 4.3 MIL/uL (ref 3.87–5.11)
RDW: 12.4 % (ref 11.5–15.5)
WBC: 6.9 10*3/uL (ref 4.0–10.5)

## 2015-10-29 LAB — BASIC METABOLIC PANEL
Anion gap: 10 (ref 5–15)
BUN: 14 mg/dL (ref 6–20)
CALCIUM: 9.2 mg/dL (ref 8.9–10.3)
CO2: 26 mmol/L (ref 22–32)
CREATININE: 0.93 mg/dL (ref 0.44–1.00)
Chloride: 99 mmol/L — ABNORMAL LOW (ref 101–111)
Glucose, Bld: 87 mg/dL (ref 65–99)
Potassium: 3.7 mmol/L (ref 3.5–5.1)
Sodium: 135 mmol/L (ref 135–145)

## 2015-10-29 LAB — I-STAT TROPONIN, ED
TROPONIN I, POC: 0 ng/mL (ref 0.00–0.08)
Troponin i, poc: 0 ng/mL (ref 0.00–0.08)

## 2015-10-29 NOTE — ED Notes (Signed)
Per ems-- pt has been evaluated a few times for heart palpitations. Pt reports HR will increased to as high as 180 and bp will elevate as well. Pt is scheduled for echocardiogram tomorrow but today she has had continued episodes of palpitations. Ems reprots highest hr 110- ST. Vs otherwise stable. Pt reports chest tightness

## 2015-10-29 NOTE — ED Provider Notes (Signed)
CSN: [id number]     Arrival date & time 10/29/15  1743 History   First MD Initiated Contact with Patient 10/29/15 1745     Chief Complaint  Patient presents with  . Palpitations     (Consider location/radiation/quality/duration/timing/severity/associated sxs/prior Treatment) HPI Comments: Palpitations intermittently for 3 mos When palpitations occur, they last 36min-1hr, today didn't go away CP developed today, feels like aching, middle of chest Not exertional, positional or pleuritic No fevers Shortness of breath with palpitations, however no other SOB No hx of OCP use/cancer hx/prior thromboembolism.  Has had positive ddimers before and negative CTAs.  Wore cardiac monitor Sees cardiology Scheduled for echo tomorrow    Past Medical History  Diagnosis Date  . Esophageal stricture   . Chest pain 09/14/2009    no pulmonary embolus by chest CT  . Palpitations   . Elevated BP   . Anxiety     past Hx  . Depression     past Hx  . GERD (gastroesophageal reflux disease)     no meds  . Sickle cell trait ([place])   . Fibroid   . Abnormal Pap smear     cryo, normal since  . Fibroids 09/24/2015   Past Surgical History  Procedure Laterality Date  . Cesarean section  2004  . Laparoscopy for ectopic pregnancy  2001  . Wisdom tooth extraction  2012   Family History  Problem Relation Age of Onset  . Asthma Mother   . Hypertension Mother   . Hypertension Paternal Uncle   . Asthma Maternal Grandmother   . Colon cancer Neg Hx   . Esophageal cancer Neg Hx   . Stomach cancer Neg Hx   . Asthma Daughter   . Thyroid disease Mother   . Heart Problems Mother    Social History  Substance Use Topics  . Smoking status: Never Smoker   . Smokeless tobacco: Never Used  . Alcohol Use: No   OB History    Gravida Para Term Preterm AB TAB SAB Ectopic Multiple Living   6 2 2  0 3 0 0 3 0 2     Review of Systems  Constitutional: Negative for fever.  HENT: Negative for sore throat.    Eyes: Negative for visual disturbance.  Respiratory: Positive for shortness of breath (when palpitations occur). Negative for cough.   Cardiovascular: Positive for chest pain and palpitations.  Gastrointestinal: Negative for nausea, vomiting and abdominal pain.  Genitourinary: Negative for difficulty urinating.  Musculoskeletal: Negative for back pain and neck pain.  Skin: Negative for rash.  Neurological: Positive for light-headedness. Negative for syncope and headaches.      Allergies  Hydromorphone; Morphine; and Diphenhydramine  Home Medications   Prior to Admission medications   Medication Sig Start Date End Date Taking? Authorizing Provider  aspirin 81 MG tablet Take 81 mg by mouth daily.   Yes Historical Provider, MD  azithromycin (ZITHROMAX) 250 MG tablet See admin instructions. Z-pack dosage for 5 days 10/27/15  Yes Historical Provider, MD  clonazePAM (KLONOPIN) 0.5 MG tablet Take 1 tablet by mouth 2 (two) times daily as needed. anxiety 10/05/15  Yes Historical Provider, MD  Cyanocobalamin (VITAMIN B-12) 1000 MCG/15ML LIQD Take 5 mLs by mouth daily.   Yes Historical Provider, MD  famotidine (PEPCID) 20 MG tablet Take 20 mg by mouth daily as needed for heartburn or indigestion.   Yes Historical Provider, MD  ferrous sulfate 324 (65 FE) MG TBEC Take 1 tablet by mouth daily.  Yes Historical Provider, MD  hydrochlorothiazide (HYDRODIURIL) 12.5 MG tablet Take 1 tablet (12.5 mg total) by mouth daily. 10/28/15  Yes Brittainy Erie Noe, PA-C  metoprolol succinate (TOPROL-XL) 25 MG 24 hr tablet Take 1 tablet (25 mg total) by mouth daily. Patient taking differently: Take 25 mg by mouth at bedtime.  10/28/15  Yes Everlene Balls, MD  potassium chloride (K-DUR) 10 MEQ tablet Take 1 tablet (10 mEq total) by mouth daily. 10/28/15  Yes Brittainy Erie Noe, PA-C  Vitamin D, Ergocalciferol, (DRISDOL) 50000 UNITS CAPS capsule TAKE 1 CAPSULE BY MOUTH WEEKLY. 03/23/10  Yes Historical Provider, MD    BP 109/66 mmHg  Pulse 82  Temp(Src) 98.8 F (37.1 C) (Oral)  Resp 23  Ht 5\' 6"  (1.676 m)  Wt 192 lb (87.091 kg)  BMI 31.00 kg/m2  SpO2 98%  LMP 10/06/2015 Physical Exam  Constitutional: She is oriented to person, place, and time. She appears well-developed and well-nourished. No distress.  HENT:  Head: Normocephalic and atraumatic.  Eyes: Conjunctivae and EOM are normal.  Neck: Normal range of motion.  Cardiovascular: Normal rate, regular rhythm, normal heart sounds and intact distal pulses.  Exam reveals no gallop and no friction rub.   No murmur heard. Pulmonary/Chest: Effort normal and breath sounds normal. No respiratory distress. She has no wheezes. She has no rales.  Abdominal: Soft. She exhibits no distension. There is no tenderness. There is no guarding.  Musculoskeletal: She exhibits no edema or tenderness.  Neurological: She is alert and oriented to person, place, and time.  Skin: Skin is warm and dry. No rash noted. She is not diaphoretic. No erythema.  Nursing note and vitals reviewed.   ED Course  Procedures (including critical care time) Labs Review Labs Reviewed  BASIC METABOLIC PANEL - Abnormal; Notable for the following:    Chloride 99 (*)    All other components within normal limits  CBC - Abnormal; Notable for the following:    HCT 35.3 (*)    All other components within normal limits  I-STAT TROPOININ, ED  I-STAT TROPOININ, ED  Randolm Idol, ED    Imaging Review Dg Chest 2 View  10/29/2015  CLINICAL DATA:  Chest pain, increased heart rate EXAM: CHEST  2 VIEW COMPARISON:  10/07/2015 FINDINGS: Cardiomediastinal silhouette is stable. No acute infiltrate or pleural effusion. No pulmonary edema. Bony thorax is unremarkable. IMPRESSION: No active cardiopulmonary disease. Electronically Signed   By: Lahoma Crocker M.D.   On: 10/29/2015 18:16   I have personally reviewed and evaluated these images and lab results as part of my medical decision-making.    EKG Interpretation None      MDM   Final diagnoses:  Palpitations  Chest pain, unspecified chest pain type   44 year old female with history of palpitations, with multiple ED visits over last several days for palpitations, prior evaluations including Holter monitor, CTA, and Cardiology evaluation with ECHO scheduled tomorrow who presents with concern for chest pain and palpitations.   EKG evaluated by me and shows sinus rhythm with no sign of prolonged QTc, no brugada, no sign of HOCM, no ST abnormalities. Low risk HEAR score with 2 negative troponins.  Patient is PERC negative and doubt PE (also negative w/u in past).  CXR without acute pathology. Labs WNL.  No sign of arryhthmia while monitored for hours in ED. Recommend continued close follow up and outpt evaluation of symptoms including cardiology follow up.  Pt recently started on metoprolol and discussed may continue this to  see if it provides symptomatic relief. Patient discharged in stable condition with understanding of reasons to return.    Gareth Morgan, MD 10/30/15 [phone]

## 2015-10-29 NOTE — ED Notes (Signed)
Patient is resting comfortably. 

## 2015-10-31 ENCOUNTER — Encounter (HOSPITAL_COMMUNITY): Payer: Self-pay | Admitting: Emergency Medicine

## 2015-10-31 ENCOUNTER — Other Ambulatory Visit: Payer: Self-pay

## 2015-10-31 ENCOUNTER — Emergency Department (HOSPITAL_COMMUNITY)
Admission: EM | Admit: 2015-10-31 | Discharge: 2015-11-01 | Disposition: A | Discharge status: Home / Self Care | Payer: BLUE CROSS/BLUE SHIELD | Attending: Emergency Medicine | Admitting: Emergency Medicine

## 2015-10-31 DIAGNOSIS — Z8719 Personal history of other diseases of the digestive system: Secondary | ICD-10-CM | POA: Diagnosis not present

## 2015-10-31 DIAGNOSIS — Z86018 Personal history of other benign neoplasm: Secondary | ICD-10-CM | POA: Insufficient documentation

## 2015-10-31 DIAGNOSIS — F329 Major depressive disorder, single episode, unspecified: Secondary | ICD-10-CM | POA: Insufficient documentation

## 2015-10-31 DIAGNOSIS — R079 Chest pain, unspecified: Secondary | ICD-10-CM | POA: Diagnosis not present

## 2015-10-31 DIAGNOSIS — Z79899 Other long term (current) drug therapy: Secondary | ICD-10-CM | POA: Insufficient documentation

## 2015-10-31 DIAGNOSIS — R002 Palpitations: Secondary | ICD-10-CM | POA: Insufficient documentation

## 2015-10-31 DIAGNOSIS — F419 Anxiety disorder, unspecified: Secondary | ICD-10-CM | POA: Diagnosis not present

## 2015-10-31 DIAGNOSIS — Z7982 Long term (current) use of aspirin: Secondary | ICD-10-CM | POA: Diagnosis not present

## 2015-10-31 DIAGNOSIS — D573 Sickle-cell trait: Secondary | ICD-10-CM | POA: Insufficient documentation

## 2015-10-31 NOTE — ED Notes (Signed)
Pt from home c/o left sided chest pain and left arm that started about one hour ago. She has been seen [facility] the past two days for same.She was given metoprolol for BP and rate control. Denies shortness of breath.

## 2015-11-01 LAB — I-STAT TROPONIN, ED: Troponin i, poc: 0 ng/mL (ref 0.00–0.08)

## 2015-11-01 NOTE — Discharge Instructions (Signed)
Nonspecific Chest Pain  °Chest pain can be caused by many different conditions. There is always a chance that your pain could be related to something serious, such as a heart attack or a blood clot in your lungs. Chest pain can also be caused by conditions that are not life-threatening. If you have chest pain, it is very important to follow up with your health care provider. °CAUSES  °Chest pain can be caused by: °· Heartburn. °· Pneumonia or bronchitis. °· Anxiety or stress. °· Inflammation around your heart (pericarditis) or lung (pleuritis or pleurisy). °· A blood clot in your lung. °· A collapsed lung (pneumothorax). It can develop suddenly on its own (spontaneous pneumothorax) or from trauma to the chest. °· Shingles infection (varicella-zoster virus). °· Heart attack. °· Damage to the bones, muscles, and cartilage that make up your chest wall. This can include: °¨ Bruised bones due to injury. °¨ Strained muscles or cartilage due to frequent or repeated coughing or overwork. °¨ Fracture to one or more ribs. °¨ Sore cartilage due to inflammation (costochondritis). °RISK FACTORS  °Risk factors for chest pain may include: °· Activities that increase your risk for trauma or injury to your chest. °· Respiratory infections or conditions that cause frequent coughing. °· Medical conditions or overeating that can cause heartburn. °· Heart disease or family history of heart disease. °· Conditions or health behaviors that increase your risk of developing a blood clot. °· Having had chicken pox (varicella zoster). °SIGNS AND SYMPTOMS °Chest pain can feel like: °· Burning or tingling on the surface of your chest or deep in your chest. °· Crushing, pressure, aching, or squeezing pain. °· Dull or sharp pain that is worse when you move, cough, or take a deep breath. °· Pain that is also felt in your back, neck, shoulder, or arm, or pain that spreads to any of these areas. °Your chest pain may come and go, or it may stay  constant. °DIAGNOSIS °Lab tests or other studies may be needed to find the cause of your pain. Your health care provider may have you take a test called an ambulatory ECG (electrocardiogram). An ECG records your heartbeat patterns at the time the test is performed. You may also have other tests, such as: °· Transthoracic echocardiogram (TTE). During echocardiography, sound waves are used to create a picture of all of the heart structures and to look at how blood flows through your heart. °· Transesophageal echocardiogram (TEE). This is a more advanced imaging test that obtains images from inside your body. It allows your health care provider to see your heart in finer detail. °· Cardiac monitoring. This allows your health care provider to monitor your heart rate and rhythm in real time. °· Holter monitor. This is a portable device that records your heartbeat and can help to diagnose abnormal heartbeats. It allows your health care provider to track your heart activity for several days, if needed. °· Stress tests. These can be done through exercise or by taking medicine that makes your heart beat more quickly. °· Blood tests. °· Imaging tests. °TREATMENT  °Your treatment depends on what is causing your chest pain. Treatment may include: °· Medicines. These may include: °¨ Acid blockers for heartburn. °¨ Anti-inflammatory medicine. °¨ Pain medicine for inflammatory conditions. °¨ Antibiotic medicine, if an infection is present. °¨ Medicines to dissolve blood clots. °¨ Medicines to treat coronary artery disease. °· Supportive care for conditions that do not require medicines. This may include: °¨ Resting. °¨ Applying heat   or cold packs to injured areas. °¨ Limiting activities until pain decreases. °HOME CARE INSTRUCTIONS °· If you were prescribed an antibiotic medicine, finish it all even if you start to feel better. °· Avoid any activities that bring on chest pain. °· Do not use any tobacco products, including  cigarettes, chewing tobacco, or electronic cigarettes. If you need help quitting, ask your health care provider. °· Do not drink alcohol. °· Take medicines only as directed by your health care provider. °· Keep all follow-up visits as directed by your health care provider. This is important. This includes any further testing if your chest pain does not go away. °· If heartburn is the cause for your chest pain, you may be told to keep your head raised (elevated) while sleeping. This reduces the chance that acid will go from your stomach into your esophagus. °· Make lifestyle changes as directed by your health care provider. These may include: °¨ Getting regular exercise. Ask your health care provider to suggest some activities that are safe for you. °¨ Eating a heart-healthy diet. A registered dietitian can help you to learn healthy eating options. °¨ Maintaining a healthy weight. °¨ Managing diabetes, if necessary. °¨ Reducing stress. °SEEK MEDICAL CARE IF: °· Your chest pain does not go away after treatment. °· You have a rash with blisters on your chest. °· You have a fever. °SEEK IMMEDIATE MEDICAL CARE IF:  °· Your chest pain is worse. °· You have an increasing cough, or you cough up blood. °· You have severe abdominal pain. °· You have severe weakness. °· You faint. °· You have chills. °· You have sudden, unexplained chest discomfort. °· You have sudden, unexplained discomfort in your arms, back, neck, or jaw. °· You have shortness of breath at any time. °· You suddenly start to sweat, or your skin gets clammy. °· You feel nauseous or you vomit. °· You suddenly feel light-headed or dizzy. °· Your heart begins to beat quickly, or it feels like it is skipping beats. °These symptoms may represent a serious problem that is an emergency. Do not wait to see if the symptoms will go away. Get medical help right away. Call your local emergency services (911 in the [place] Do not drive yourself to the hospital. °  °This  information is not intended to replace advice given to you by your health care provider. Make sure you discuss any questions you have with your health care provider. °  °Document Released: 08/24/2005 Document Revised: 12/05/2014 Document Reviewed: 06/20/2014 °Elsevier Interactive Patient Education ©2016 [employer] ° °

## 2015-11-01 NOTE — ED Notes (Signed)
Pt states she feels calm. Currently asymptomatic.

## 2015-11-01 NOTE — ED Provider Notes (Signed)
CSN: [id number]     Arrival date & time 10/31/15  2328 History  By signing my name below, I, Stephania Fragmin, attest that this documentation has been prepared under the direction and in the presence of Orpah Greek, MD. Electronically Signed: Stephania Fragmin, ED Scribe. 11/01/2015. 1:39 AM.    Chief Complaint  Patient presents with  . Chest Pain   The history is provided by the patient and medical records. No language interpreter was used.   HPI Comments: Stacy [name] is a 44 y.o. female with a history of palpitations and anxiety, who presents to the Emergency Department complaining of sharp, burning chest pain that began 2-3 hours ago and woke her up from sleep. She also complains of associated heart-racing palpitations that were concurrent with her chest pain. However, she states her symptoms have since resolved, and she is presently asymptomatic. She reports a history of the same, and medical records show that patient has had several ED visits in the past week for palpitations. Patient does states she has had cardiac stress tests in the past that were negative. She reports she is scheduled to see her cardiologist in 6 days. She states she was recently started on metoprolol to treat her recurrent symptoms. She denies any significant stressors currently.  Past Medical History  Diagnosis Date  . Esophageal stricture   . Chest pain 09/14/2009    no pulmonary embolus by chest CT  . Palpitations   . Elevated BP   . Anxiety     past Hx  . Depression     past Hx  . GERD (gastroesophageal reflux disease)     no meds  . Sickle cell trait ([place])   . Fibroid   . Abnormal Pap smear     cryo, normal since  . Fibroids 09/24/2015   Past Surgical History  Procedure Laterality Date  . Cesarean section  2004  . Laparoscopy for ectopic pregnancy  2001  . Wisdom tooth extraction  2012   Family History  Problem Relation Age of Onset  . Asthma Mother   . Hypertension Mother   .  Hypertension Paternal Uncle   . Asthma Maternal Grandmother   . Colon cancer Neg Hx   . Esophageal cancer Neg Hx   . Stomach cancer Neg Hx   . Asthma Daughter   . Thyroid disease Mother   . Heart Problems Mother    Social History  Substance Use Topics  . Smoking status: Never Smoker   . Smokeless tobacco: Never Used  . Alcohol Use: No   OB History    Gravida Para Term Preterm AB TAB SAB Ectopic Multiple Living   6 2 2  0 3 0 0 3 0 2     Review of Systems  Cardiovascular: Positive for chest pain and palpitations.  All other systems reviewed and are negative.     Allergies  Hydromorphone; Morphine; and Diphenhydramine  Home Medications   Prior to Admission medications   Medication Sig Start Date End Date Taking? Authorizing Provider  aspirin 81 MG tablet Take 81 mg by mouth daily.    Historical Provider, MD  azithromycin (ZITHROMAX) 250 MG tablet See admin instructions. Z-pack dosage for 5 days 10/27/15   Historical Provider, MD  clonazePAM (KLONOPIN) 0.5 MG tablet Take 1 tablet by mouth 2 (two) times daily as needed. anxiety 10/05/15   Historical Provider, MD  Cyanocobalamin (VITAMIN B-12) 1000 MCG/15ML LIQD Take 5 mLs by mouth daily.    Historical Provider,  MD  famotidine (PEPCID) 20 MG tablet Take 20 mg by mouth daily as needed for heartburn or indigestion.    Historical Provider, MD  ferrous sulfate 324 (65 FE) MG TBEC Take 1 tablet by mouth daily.    Historical Provider, MD  hydrochlorothiazide (HYDRODIURIL) 12.5 MG tablet Take 1 tablet (12.5 mg total) by mouth daily. 10/28/15   Brittainy Erie Noe, PA-C  metoprolol succinate (TOPROL-XL) 25 MG 24 hr tablet Take 1 tablet (25 mg total) by mouth daily. Patient taking differently: Take 25 mg by mouth at bedtime.  10/28/15   Everlene Balls, MD  potassium chloride (K-DUR) 10 MEQ tablet Take 1 tablet (10 mEq total) by mouth daily. 10/28/15   Brittainy Erie Noe, PA-C  Vitamin D, Ergocalciferol, (DRISDOL) 50000 UNITS CAPS capsule  TAKE 1 CAPSULE BY MOUTH WEEKLY. 03/23/10   Historical Provider, MD   BP 97/68 mmHg  Pulse 69  Temp(Src) 98 F (36.7 C) (Oral)  Resp 18  Ht 5\' 6"  (1.676 m)  Wt 192 lb (87.091 kg)  BMI 31.00 kg/m2  SpO2 97%  LMP 10/06/2015 Physical Exam  Constitutional: She is oriented to person, place, and time. She appears well-developed and well-nourished. No distress.  HENT:  Head: Normocephalic and atraumatic.  Right Ear: Hearing normal.  Left Ear: Hearing normal.  Nose: Nose normal.  Mouth/Throat: Oropharynx is clear and moist and mucous membranes are normal.  Eyes: Conjunctivae and EOM are normal. Pupils are equal, round, and reactive to light.  Neck: Normal range of motion. Neck supple.  Cardiovascular: Regular rhythm, S1 normal and S2 normal.  Exam reveals no gallop and no friction rub.   No murmur heard. Pulmonary/Chest: Effort normal and breath sounds normal. No respiratory distress. She exhibits no tenderness.  Abdominal: Soft. Normal appearance and bowel sounds are normal. There is no hepatosplenomegaly. There is no tenderness. There is no rebound, no guarding, no tenderness at McBurney's point and negative Murphy's sign. No hernia.  Musculoskeletal: Normal range of motion.  Neurological: She is alert and oriented to person, place, and time. She has normal strength. No cranial nerve deficit or sensory deficit. Coordination normal. GCS eye subscore is 4. GCS verbal subscore is 5. GCS motor subscore is 6.  Skin: Skin is warm, dry and intact. No rash noted. No cyanosis.  Psychiatric: She has a normal mood and affect. Her speech is normal and behavior is normal. Thought content normal.  Nursing note and vitals reviewed.   ED Course  Procedures (including critical care time)  DIAGNOSTIC STUDIES: Oxygen Saturation is 99% on RA, normal by my interpretation.    COORDINATION OF CARE: 1:14 AM - EKG and lab results reviewed with pt - pt reassured. Discussed treatment plan with pt at bedside  which includes referral to outpatient cardiology for further evaluation. Pt verbalized understanding and agreed to plan.  Labs Review Labs Reviewed  Randolm Idol, ED    Imaging Review No results found. I have personally reviewed and evaluated these images and lab results as part of my medical decision-making.   EKG Interpretation   Date/Time:  Saturday October 31 2015 23:39:00 EST Ventricular Rate:  92 PR Interval:  148 QRS Duration: 101 QT Interval:  342 QTC Calculation: 423 R Axis:   46 Text Interpretation:  Sinus rhythm RSR' in V1 or V2, right VCD or RVH  Baseline wander in lead(s) V1 V6 No significant change since last tracing  Confirmed by POLLINA  MD, CHRISTOPHER [phone]) on 11/01/2015 1:13:43 AM  MDM   Final diagnoses:  Chest pain, unspecified chest pain type   Presents to the emergency for evaluation of chest pain. Patient reports pain that awakened her from sleep. She had a sharp and burning sensation in the left chest that radiated to left arm. Patient has had intermittent recurrent episodes of this pain over the last couple of months. Symptoms are generally accompanied by palpitations. She has had outpatient stress test, outpatient echo performed which were reportedly unremarkable. She has scheduled follow-up with radiology this coming week. She was started on Lopressor for her palpitations this past week. Patient was seen in the ER 3 days ago with similar symptoms and had multiple troponins are negative. She had an EKG that is unchanged and a negative troponin today. I do not feel the patient requires any further workup at this time. Symptoms are similar to the recurrent symptoms she has been experiencing for months. Recommend she continue the Lopressor and follow up with cardiology on Friday, consideration of long-term cardiac monitoring. Return for any recurrent symptoms of palpitations to make a diagnosis of rhythm.  I personally performed the services  described in this documentation, which was scribed in my presence. The recorded information has been reviewed and is accurate.     Orpah Greek, MD 11/01/15 [phone]

## 2015-11-02 NOTE — Addendum Note (Signed)
Addended by: Freada Bergeron on: 11/02/2015 05:30 PM   Modules accepted: Orders

## 2015-11-04 ENCOUNTER — Encounter: Payer: Self-pay | Admitting: Obstetrics & Gynecology

## 2015-11-04 ENCOUNTER — Ambulatory Visit (INDEPENDENT_AMBULATORY_CARE_PROVIDER_SITE_OTHER): Payer: BLUE CROSS/BLUE SHIELD | Admitting: Obstetrics & Gynecology

## 2015-11-04 ENCOUNTER — Ambulatory Visit (INDEPENDENT_AMBULATORY_CARE_PROVIDER_SITE_OTHER): Payer: BLUE CROSS/BLUE SHIELD | Admitting: Physician Assistant

## 2015-11-04 ENCOUNTER — Encounter: Payer: Self-pay | Admitting: Physician Assistant

## 2015-11-04 VITALS — BP 124/90 | HR 75 | Ht 66.0 in | Wt 193.5 lb

## 2015-11-04 VITALS — BP 128/86 | HR 98 | Ht 66.0 in | Wt 192.0 lb

## 2015-11-04 DIAGNOSIS — Z0000 Encounter for general adult medical examination without abnormal findings: Secondary | ICD-10-CM

## 2015-11-04 DIAGNOSIS — Z124 Encounter for screening for malignant neoplasm of cervix: Secondary | ICD-10-CM | POA: Diagnosis not present

## 2015-11-04 DIAGNOSIS — R002 Palpitations: Secondary | ICD-10-CM | POA: Diagnosis not present

## 2015-11-04 DIAGNOSIS — I10 Essential (primary) hypertension: Secondary | ICD-10-CM

## 2015-11-04 DIAGNOSIS — N921 Excessive and frequent menstruation with irregular cycle: Secondary | ICD-10-CM | POA: Diagnosis not present

## 2015-11-04 DIAGNOSIS — D251 Intramural leiomyoma of uterus: Secondary | ICD-10-CM

## 2015-11-04 DIAGNOSIS — Z1151 Encounter for screening for human papillomavirus (HPV): Secondary | ICD-10-CM

## 2015-11-04 MED ORDER — MISOPROSTOL 200 MCG PO TABS: ORAL_TABLET | ORAL | Status: DC

## 2015-11-04 MED ORDER — PROPRANOLOL HCL 20 MG PO TABS: 20.0000 mg | ORAL_TABLET | Freq: Two times a day (BID) | ORAL | Status: DC

## 2015-11-04 NOTE — Progress Notes (Signed)
Cardiology Office Note   Date:  11/04/2015   ID:  CORTEZ NACKE, DOB Apr 01, 1971, MRN [id number]  PCP:  Benito Mccreedy, MD  Cardiologist:  Dr Barbara Cower, Suanne Marker, PA-C   Fatigue on beta blockers, continued episodes of palpitations  History of Present Illness: Stacy [name] is a 44 y.o. female with a history of palpitations, anxiety, depression, chest pain and HL.  Dr Oval Linsey eval 08/2015, ordered ETT, event monitor and sleep study.   ETT negative but had hypertensive response. Event monitor ST only. No OSA at sleep study.   Brittany eval 10/28/2015, echo ordered. K+ low on HCTZ, supplemented. F/u scheduled.  Stacy [name] presents for follow-up evaluation. She has noticed that her heart rate does not go as high on the beta blocker. When she was in the emergency room prior to starting the beta blocker, her heart rate go up to 160. However since being on the beta blocker, she will still get palpitations and feel her heart rate increase, but able only be up to about 130. Her blood pressure has been running high at home frequently, up to 160. Her baseline heart rate is never below 70.  She has felt a great deal of fatigue on the beta blocker and sometimes feels like her legs get weak as well. Her blood pressure has not been low when she has checked it. She has not had orthostatic dizziness or lightheadedness. She has not had presyncope when her heart rate is rapid. She has fibroids and may need surgery.   Past Medical History  Diagnosis Date  . Esophageal stricture   . Chest pain 09/14/2009    no pulmonary embolus by chest CT  . Palpitations   . Elevated BP   . Anxiety     past Hx  . Depression     past Hx  . GERD (gastroesophageal reflux disease)     no meds  . Sickle cell trait ([place])   . Fibroid   . Abnormal Pap smear     cryo, normal since  . Fibroids 09/24/2015    Past Surgical History  Procedure Laterality Date  . Cesarean  section  2004  . Laparoscopy for ectopic pregnancy  2001  . Wisdom tooth extraction  2012    Current Outpatient Prescriptions  Medication Sig Dispense Refill  . aspirin 81 MG tablet Take 81 mg by mouth daily.    Marland Kitchen azithromycin (ZITHROMAX) 250 MG tablet See admin instructions. Z-pack dosage for 5 days  0  . clonazePAM (KLONOPIN) 0.5 MG tablet Take 1 tablet by mouth 2 (two) times daily as needed. anxiety  0  . Cyanocobalamin (VITAMIN B-12) 1000 MCG/15ML LIQD Take 5 mLs by mouth daily.    . famotidine (PEPCID) 20 MG tablet Take 20 mg by mouth daily as needed for heartburn or indigestion.    . ferrous sulfate 324 (65 FE) MG TBEC Take 1 tablet by mouth daily.    . hydrochlorothiazide (HYDRODIURIL) 12.5 MG tablet Take 1 tablet (12.5 mg total) by mouth daily. 30 tablet 6  . metoprolol succinate (TOPROL-XL) 25 MG 24 hr tablet Take 1 tablet (25 mg total) by mouth daily. (Patient taking differently: Take 25 mg by mouth at bedtime. ) 10 tablet 0  . potassium chloride (K-DUR) 10 MEQ tablet Take 1 tablet (10 mEq total) by mouth daily. 90 tablet 3  . Vitamin D, Ergocalciferol, (DRISDOL) 50000 UNITS CAPS capsule TAKE 1 CAPSULE BY MOUTH WEEKLY.     No current  facility-administered medications for this visit.    Allergies:   Hydromorphone; Morphine; and Diphenhydramine    Social History:  The patient  reports that she has never smoked. She has never used smokeless tobacco. She reports that she does not drink alcohol or use illicit drugs.   Family History:  The patient's family history includes Asthma in her daughter, maternal grandmother, and mother; Heart Problems in her mother; Hypertension in her mother and paternal uncle; Thyroid disease in her mother. There is no history of Colon cancer, Esophageal cancer, or Stomach cancer.    ROS:  Please see the history of present illness. All other systems are reviewed and negative.    PHYSICAL EXAM: VS:  BP 124/90 mmHg  Pulse 75  Ht 5\' 6"  (1.676 m)  Wt  193 lb 8 oz (87.771 kg)  BMI 31.25 kg/m2  LMP 10/06/2015 , BMI Body mass index is 31.25 kg/(m^2). GEN: Well nourished, well developed, female in no acute distress HEENT: normal for age  Neck: no JVD, no carotid bruit, no masses Cardiac: RRR; no murmur, no rubs, or gallops Respiratory:  clear to auscultation bilaterally, normal work of breathing GI: soft, nontender, nondistended, + BS MS: no deformity or atrophy; no edema; distal pulses are 2+ in all 4 extremities  Skin: warm and dry, no rash Neuro:  Strength and sensation are intact Psych: euthymic mood, full affect   EKG:  EKG is ordered today. Sinus rhythm, no acute ischemic changes  Recent Labs: 08/07/2015: B Natriuretic Peptide 27.2 09/22/2015: TSH 1.010 10/27/2015: ALT 16 10/29/2015: BUN 14; Creatinine, Ser 0.93; Hemoglobin 12.2; Platelets 323; Potassium 3.7; Sodium 135    Lipid Panel    Component Value Date/Time   CHOL 178 09/28/2009 2307   TRIG 62 09/28/2009 2307   HDL 53 09/28/2009 2307   CHOLHDL 3.4 Ratio 09/28/2009 2307   VLDL 12 09/28/2009 2307   LDLCALC 113* 09/28/2009 2307     Wt Readings from Last 3 Encounters:  11/04/15 193 lb 8 oz (87.771 kg)  10/31/15 192 lb (87.091 kg)  10/29/15 192 lb (87.091 kg)     Other studies Reviewed: Additional studies/ records that were reviewed today include: Hospital records and office notes.  ASSESSMENT AND PLAN:  1.  Palpitations: An event monitor showed only sinus tachycardia. She acknowledges that her heart rate is not as hilum the beta blocker as it was previously. Her family physician had suggested changing her beta blocker to propanolol 20 mg twice a day.  Advised her that she needed to take the metoprolol for at least a week before determining that the fatigue was not going to improve. Patient stated she would do so. Gave her a paper prescription for propanolol 20 mg twice a day. Advised her she can change from the metoprolol to the propranolol if she wishes as the  propanolol, although a beta blocker, works a little differently.   However, when she realized that the metoprolol is helping her blood pressure and heart rate, she was doing it a little more favorably.  2. Hypertension: Her diastolic blood pressure is still elevated and her systolic blood pressure runs high at times. Advised her that the HCTZ is a good drug for her. She is struggling with the potassium, we can decrease that to every other day. She is also encouraged to eat potassium rich foods.  3. Echocardiogram: She had an echocardiogram performed through her family physician. It was sent to an outside facility to be red. The results should be available  next week. We will follow-up on the results and determine further testing based on those.   Current medicines are reviewed at length with the patient today.  The patient has concerns regarding medicines. All concerns were addressed  The following changes have been made:  Okay to substitute propanolol for metoprolol  Labs/ tests ordered today include:   Orders Placed This Encounter  Procedures  . EKG 12-Lead     Disposition:   FU with Dr. Oval Linsey   Signed, Lenoard Aden  11/04/2015 10:41 AM    Stacy [name], [place], [place]  [address] Phone: [phone]; Fax: [phone]

## 2015-11-04 NOTE — Progress Notes (Signed)
   Subjective:    Patient ID: Stacy [name], female    DOB: October 04, 1971, 44 y.o.   MRN: [id number]  HPI 44 yo separated AA P2 ( 57 and 80 yo kids) here today for follow up for her menorrhagia and fibroids. She saw Dr. Harolyn Rutherford at the [facility] office 2 months ago for these issues. Her US showed a 5.8 cm fibroid. Her hbg was 12. She reports a normal TSH with her FP a few weeks ago.  Her biggest complaint is of that she now reports that her periods are coming every 2 weeks.  She uses condoms for contraception. She does NOT want any more kids.   Review of Systems She reports a normal pap smear with [facility] 12/15 Mammogram due She had the Mirena for about 5 years She already had a flu vaccine this season    Objective:   Physical Exam  [id number] Breathing, conversing, and ambulating normally Breasts- normal bilaterally Abd- benign Uterus- ULN size, RV, mobile, NT, normal adnexal exam      Assessment & Plan:  DUB/menorrhagia- offered d&c with HTA versus Mirena Contraception- l/s BS versus Mirena/EMBX Preventative care- pap/mammo today She will let me know what she wants

## 2015-11-04 NOTE — Patient Instructions (Signed)
Your physician has recommended you make the following change in your medication: you have been given a prescription today for propanolol if you decide to change the metoprolol.   Your provider recommends that you schedule a follow-up appointment in: January with Dr. Oval Linsey.

## 2015-11-05 ENCOUNTER — Ambulatory Visit: Payer: BLUE CROSS/BLUE SHIELD | Admitting: Psychology

## 2015-11-05 ENCOUNTER — Ambulatory Visit: Payer: Self-pay | Admitting: Family Medicine

## 2015-11-05 DIAGNOSIS — Z124 Encounter for screening for malignant neoplasm of cervix: Secondary | ICD-10-CM

## 2015-11-06 ENCOUNTER — Encounter (HOSPITAL_COMMUNITY): Payer: Self-pay | Admitting: *Deleted

## 2015-11-06 ENCOUNTER — Ambulatory Visit: Payer: BLUE CROSS/BLUE SHIELD | Admitting: Nurse Practitioner

## 2015-11-06 ENCOUNTER — Other Ambulatory Visit: Payer: Self-pay

## 2015-11-06 ENCOUNTER — Emergency Department (HOSPITAL_COMMUNITY)
Admission: EM | Admit: 2015-11-06 | Discharge: 2015-11-06 | Disposition: A | Discharge status: Home / Self Care | Payer: BLUE CROSS/BLUE SHIELD | Attending: Emergency Medicine | Admitting: Emergency Medicine

## 2015-11-06 ENCOUNTER — Other Ambulatory Visit: Payer: Self-pay | Admitting: Physician Assistant

## 2015-11-06 DIAGNOSIS — R002 Palpitations: Secondary | ICD-10-CM | POA: Diagnosis not present

## 2015-11-06 DIAGNOSIS — Z79899 Other long term (current) drug therapy: Secondary | ICD-10-CM | POA: Diagnosis not present

## 2015-11-06 DIAGNOSIS — Z86018 Personal history of other benign neoplasm: Secondary | ICD-10-CM | POA: Insufficient documentation

## 2015-11-06 DIAGNOSIS — K219 Gastro-esophageal reflux disease without esophagitis: Secondary | ICD-10-CM | POA: Diagnosis not present

## 2015-11-06 DIAGNOSIS — Z7982 Long term (current) use of aspirin: Secondary | ICD-10-CM | POA: Insufficient documentation

## 2015-11-06 DIAGNOSIS — D573 Sickle-cell trait: Secondary | ICD-10-CM | POA: Diagnosis not present

## 2015-11-06 DIAGNOSIS — F419 Anxiety disorder, unspecified: Secondary | ICD-10-CM | POA: Diagnosis not present

## 2015-11-06 DIAGNOSIS — R079 Chest pain, unspecified: Secondary | ICD-10-CM | POA: Diagnosis present

## 2015-11-06 DIAGNOSIS — R5383 Other fatigue: Secondary | ICD-10-CM | POA: Insufficient documentation

## 2015-11-06 LAB — CBC
HCT: 34.3 % — ABNORMAL LOW (ref 36.0–46.0)
Hemoglobin: 11.9 g/dL — ABNORMAL LOW (ref 12.0–15.0)
MCH: 28.7 pg (ref 26.0–34.0)
MCHC: 34.7 g/dL (ref 30.0–36.0)
MCV: 82.7 fL (ref 78.0–100.0)
PLATELETS: 350 10*3/uL (ref 150–400)
RBC: 4.15 MIL/uL (ref 3.87–5.11)
RDW: 12.3 % (ref 11.5–15.5)
WBC: 7.1 10*3/uL (ref 4.0–10.5)

## 2015-11-06 LAB — BASIC METABOLIC PANEL
Anion gap: 10 (ref 5–15)
BUN: 8 mg/dL (ref 6–20)
CALCIUM: 9.3 mg/dL (ref 8.9–10.3)
CO2: 26 mmol/L (ref 22–32)
CREATININE: 0.8 mg/dL (ref 0.44–1.00)
Chloride: 102 mmol/L (ref 101–111)
GFR calc Af Amer: >60 mL/min (ref >60)
Glucose, Bld: 95 mg/dL (ref 65–99)
Potassium: 3.3 mmol/L — ABNORMAL LOW (ref 3.5–5.1)
SODIUM: 138 mmol/L (ref 135–145)

## 2015-11-06 LAB — CYTOLOGY - PAP

## 2015-11-06 LAB — I-STAT TROPONIN, ED: TROPONIN I, POC: 0 ng/mL (ref 0.00–0.08)

## 2015-11-06 MED ORDER — PROPRANOLOL HCL 20 MG PO TABS: 20.0000 mg | ORAL_TABLET | Freq: Two times a day (BID) | ORAL | Status: DC

## 2015-11-06 NOTE — Telephone Encounter (Signed)
Rx send to pt pharmacy

## 2015-11-06 NOTE — ED Provider Notes (Signed)
CSN: [id number]     Arrival date & time 11/06/15  1854 History   First MD Initiated Contact with Patient 11/06/15 1856     Chief Complaint  Patient presents with  . Chest Pain     (Consider location/radiation/quality/duration/timing/severity/associated sxs/prior Treatment) Patient is a 44 y.o. female presenting with chest pain. The history is provided by the patient and medical records. No language interpreter was used.  Chest Pain Associated symptoms: fatigue and shortness of breath   Associated symptoms: no abdominal pain, no back pain, no cough, no diaphoresis, no dizziness, no fever, no headache, no nausea, not vomiting and no weakness    ANNICA SCHWISOW is a 44 y.o. female  With PMH of anxiety and palpitations who presents to the Emergency Department complaining of resolved episode of palpitations approx. 2 hours ago. She states that she was lying on the couch reading a magazine when she felt her heart race - associated symptoms include sob and fatigue. Home BP monitor showed BP ~ 180/110 with heart rate high 160's. At presentation to ED, heart rate of 90 and BP 110/81. SOB and palpitations have resolved, but patient still feels fatigued. Recent cardiac stress test negative, saw cardiologist on 12/07: only change was to switch metoprolol to propranolol because she was not tolerating it well. Holter monitor showed episodes of sinus tach, no arrhythmias. Patient has also seen rheum. Who informed her that all her tests were negative and it was unlikely that an autoimmune process is contributing. Several ED visits for similar sxs in the past month. Only taking 1/2 dose of her metoprolol because of side effects - fatigue. 4 baby ASA, 12.5 mg metoprolol, and 0.25 klonopin taken PTA.  Past Medical History  Diagnosis Date  . Esophageal stricture   . Chest pain 09/14/2009    no pulmonary embolus by chest CT  . Palpitations   . Elevated BP   . Anxiety     past Hx  . Depression     past  Hx  . GERD (gastroesophageal reflux disease)     no meds  . Sickle cell trait ([place])   . Fibroid   . Abnormal Pap smear     cryo, normal since  . Fibroids 09/24/2015   Past Surgical History  Procedure Laterality Date  . Cesarean section  2004  . Laparoscopy for ectopic pregnancy  2001  . Wisdom tooth extraction  2012   Family History  Problem Relation Age of Onset  . Asthma Mother   . Hypertension Mother   . Hypertension Paternal Uncle   . Asthma Maternal Grandmother   . Colon cancer Neg Hx   . Esophageal cancer Neg Hx   . Stomach cancer Neg Hx   . Asthma Daughter   . Thyroid disease Mother   . Heart Problems Mother    Social History  Substance Use Topics  . Smoking status: Never Smoker   . Smokeless tobacco: Never Used  . Alcohol Use: No   OB History    Gravida Para Term Preterm AB TAB SAB Ectopic Multiple Living   6 2 2  0 3 0 0 3 0 2     Review of Systems  Constitutional: Positive for fatigue. Negative for fever, chills and diaphoresis.  HENT: Negative for congestion, rhinorrhea and sore throat.   Eyes: Negative for visual disturbance.  Respiratory: Positive for shortness of breath. Negative for cough and wheezing.   Cardiovascular: Positive for chest pain.  Gastrointestinal: Negative for nausea, vomiting, abdominal pain,  diarrhea and constipation.  Musculoskeletal: Negative for myalgias, back pain, arthralgias and neck pain.  Skin: Negative for rash.  Neurological: Negative for dizziness, weakness and headaches.  Hematological: Does not bruise/bleed easily.      Allergies  Hydromorphone; Morphine; and Diphenhydramine  Home Medications   Prior to Admission medications   Medication Sig Start Date End Date Taking? Authorizing Provider  aspirin 81 MG tablet Take 81 mg by mouth daily.    Historical Provider, MD  clonazePAM (KLONOPIN) 0.5 MG tablet Take 1 tablet by mouth 2 (two) times daily as needed. anxiety 10/05/15   Historical Provider, MD  Cyanocobalamin  (VITAMIN B-12) 1000 MCG/15ML LIQD Take 5 mLs by mouth daily.    Historical Provider, MD  famotidine (PEPCID) 20 MG tablet Take 20 mg by mouth daily as needed for heartburn or indigestion.    Historical Provider, MD  ferrous sulfate 324 (65 FE) MG TBEC Take 1 tablet by mouth daily.    Historical Provider, MD  hydrochlorothiazide (HYDRODIURIL) 12.5 MG tablet Take 1 tablet (12.5 mg total) by mouth daily. 10/28/15   Brittainy Erie Noe, PA-C  metoprolol succinate (TOPROL-XL) 25 MG 24 hr tablet Take 25 mg by mouth at bedtime.    Historical Provider, MD  misoprostol (CYTOTEC) 200 MCG tablet Take 3 pills by mouth the night before biopsy/Mirena. 11/04/15   Emily Filbert, MD  potassium chloride (K-DUR) 10 MEQ tablet Take 1 tablet (10 mEq total) by mouth daily. Patient not taking: Reported on 11/04/2015 10/28/15   Brittainy M Rosita Fire, PA-C  propranolol (INDERAL) 20 MG tablet Take 1 tablet (20 mg total) by mouth 2 (two) times daily. 11/06/15   Rhonda G Barrett, PA-C  Vitamin D, Ergocalciferol, (DRISDOL) 50000 UNITS CAPS capsule TAKE 1 CAPSULE BY MOUTH WEEKLY. 03/23/10   Historical Provider, MD   BP 107/68 mmHg  Pulse 90  Temp(Src) 98.5 F (36.9 C) (Oral)  Resp 12  Ht 5\' 6"  (1.676 m)  Wt 86.637 kg  BMI 30.84 kg/m2  SpO2 100%  LMP 10/30/2015 Physical Exam  Constitutional: She is oriented to person, place, and time. She appears well-developed and well-nourished.  Anxious; in no acute distress  HENT:  Head: Normocephalic and atraumatic.  Cardiovascular: Normal rate, regular rhythm, normal heart sounds and intact distal pulses.  Exam reveals no gallop and no friction rub.   No murmur heard. Pulmonary/Chest: Effort normal and breath sounds normal. No respiratory distress. She has no wheezes. She has no rales. She exhibits no tenderness.  Abdominal: She exhibits no mass. There is no rebound and no guarding.  Abdomen soft, non-tender, non-distended Bowel sounds positive in all four quadrants  Musculoskeletal:  She exhibits no edema.  Neurological: She is alert and oriented to person, place, and time.  Skin: Skin is warm and dry. No rash noted.  Nursing note and vitals reviewed.   ED Course  Procedures (including critical care time) Labs Review Labs Reviewed  BASIC METABOLIC PANEL - Abnormal; Notable for the following:    Potassium 3.3 (*)    All other components within normal limits  CBC - Abnormal; Notable for the following:    Hemoglobin 11.9 (*)    HCT 34.3 (*)    All other components within normal limits  I-STAT TROPOININ, ED    Imaging Review No results found. I have personally reviewed and evaluated these images and lab results as part of my medical decision-making.   EKG Interpretation None      MDM   Final diagnoses:  Palpitations   Jeniece Longcore Payne-Mcpeek presents for resolved episode of sob and increased heart rate. Seen multiple times in ED for similar sxs. Followed by cardiology with negative stress test, long term cardiac monitoring showing episode of sinus tach, outpatient echo performed this week. Saw cards on 12/07. Only taking 1/2 of her metoprolol each night because it makes her fatigued.   Labs: Trop 0.0 - multiple trops at 0.0, EKG unchanged  A&P:   Intermittent palpitations  - Call Cardiology first thing Monday morning to schedule an appointment  - Take full prescribed dose of beta blocker  - Patient has had thorough evaluation in the past month, and at the time, I do not feel patient requires any further workup.   Patient discussed with Dr. Tyrone Nine who agrees with treatment plan.     [facility] Dovber Ernest, PA-C 11/06/15 2037  Deno Etienne, DO 11/06/15 2101

## 2015-11-06 NOTE — ED Notes (Signed)
Pt arrives from home via [facility] c/o central chest pressure radiating down both extremities with some numbness and tingling and "fluttering". EMS gave 324mg  ASA PTA.

## 2015-11-06 NOTE — Telephone Encounter (Signed)
°*  STAT* If patient is at the pharmacy, call can be transferred to refill team.   1. Which medications need to be refilled? (please list name of each medication and dose if known) Propanolol   2. Which pharmacy/location (including street and city if local pharmacy) is medication to be sent to?[facility] on [address]   3. Do they need a 30 day or 90 day supply? [place]

## 2015-11-06 NOTE — Discharge Instructions (Signed)
Continue usual home medications - take beta blocker as prescribed Call your cardiologist office first thing Monday morning and make an appointment.  Follow up with your primary doctor in 3-5 days for discussion of your diagnoses and further evaluation after today's visit; Please return to the ER for any new or worsening symptoms, any additional concerns.

## 2015-11-09 ENCOUNTER — Other Ambulatory Visit: Payer: Self-pay | Admitting: *Deleted

## 2015-11-09 ENCOUNTER — Telehealth: Payer: Self-pay | Admitting: Cardiovascular Disease

## 2015-11-09 MED ORDER — PROPRANOLOL HCL 20 MG PO TABS: 20.0000 mg | ORAL_TABLET | Freq: Two times a day (BID) | ORAL | Status: DC

## 2015-11-09 NOTE — Telephone Encounter (Signed)
Had episode of BP spike and HR spike on Friday night.  Noted her BP has been up and down.

## 2015-11-09 NOTE — Telephone Encounter (Signed)
Pt is calling in wanting to speak with a nurse about the directions for her Metoprolol Succinate. Please f/u with her  Thanks

## 2015-11-09 NOTE — Telephone Encounter (Signed)
Answered questions re/t medications .  Resent her prescription for propanolol as she'd been recommended to start this at last OV - she had not started yet and was still taking metoprolol  She took 1/2 metoprolol today. Advised OK to do additional 1/2 metoprolol this afternoon, start propanolol in AM tomorrow and discontinue metoprolol use.  Pt voiced understanding.

## 2015-11-10 ENCOUNTER — Emergency Department (HOSPITAL_COMMUNITY)
Admission: EM | Admit: 2015-11-10 | Discharge: 2015-11-10 | Disposition: A | Discharge status: Home / Self Care | Payer: BLUE CROSS/BLUE SHIELD | Attending: Emergency Medicine | Admitting: Emergency Medicine

## 2015-11-10 ENCOUNTER — Telehealth: Payer: Self-pay | Admitting: Physician Assistant

## 2015-11-10 ENCOUNTER — Emergency Department (HOSPITAL_COMMUNITY): Payer: BLUE CROSS/BLUE SHIELD

## 2015-11-10 ENCOUNTER — Other Ambulatory Visit: Payer: Self-pay

## 2015-11-10 ENCOUNTER — Encounter (HOSPITAL_COMMUNITY): Payer: Self-pay | Admitting: Emergency Medicine

## 2015-11-10 DIAGNOSIS — I10 Essential (primary) hypertension: Secondary | ICD-10-CM | POA: Insufficient documentation

## 2015-11-10 DIAGNOSIS — Z7982 Long term (current) use of aspirin: Secondary | ICD-10-CM | POA: Insufficient documentation

## 2015-11-10 DIAGNOSIS — F419 Anxiety disorder, unspecified: Secondary | ICD-10-CM | POA: Diagnosis not present

## 2015-11-10 DIAGNOSIS — F329 Major depressive disorder, single episode, unspecified: Secondary | ICD-10-CM | POA: Diagnosis not present

## 2015-11-10 DIAGNOSIS — Z79899 Other long term (current) drug therapy: Secondary | ICD-10-CM | POA: Insufficient documentation

## 2015-11-10 DIAGNOSIS — Z86018 Personal history of other benign neoplasm: Secondary | ICD-10-CM | POA: Diagnosis not present

## 2015-11-10 DIAGNOSIS — R002 Palpitations: Secondary | ICD-10-CM | POA: Diagnosis not present

## 2015-11-10 DIAGNOSIS — Z862 Personal history of diseases of the blood and blood-forming organs and certain disorders involving the immune mechanism: Secondary | ICD-10-CM | POA: Insufficient documentation

## 2015-11-10 DIAGNOSIS — K219 Gastro-esophageal reflux disease without esophagitis: Secondary | ICD-10-CM | POA: Diagnosis not present

## 2015-11-10 DIAGNOSIS — R079 Chest pain, unspecified: Secondary | ICD-10-CM | POA: Diagnosis present

## 2015-11-10 HISTORY — DX: Essential (primary) hypertension: I10

## 2015-11-10 LAB — TROPONIN I: Troponin I: <0.03 ng/mL (ref <0.031)

## 2015-11-10 LAB — BASIC METABOLIC PANEL
Anion gap: 9 (ref 5–15)
BUN: 9 mg/dL (ref 6–20)
CHLORIDE: 99 mmol/L — AB (ref 101–111)
CO2: 28 mmol/L (ref 22–32)
CREATININE: 0.79 mg/dL (ref 0.44–1.00)
Calcium: 9 mg/dL (ref 8.9–10.3)
GFR calc non Af Amer: >60 mL/min (ref >60)
Glucose, Bld: 95 mg/dL (ref 65–99)
POTASSIUM: 3.6 mmol/L (ref 3.5–5.1)
SODIUM: 136 mmol/L (ref 135–145)

## 2015-11-10 LAB — CBC
HEMATOCRIT: 35 % — AB (ref 36.0–46.0)
Hemoglobin: 12.2 g/dL (ref 12.0–15.0)
MCH: 28.6 pg (ref 26.0–34.0)
MCHC: 34.9 g/dL (ref 30.0–36.0)
MCV: 82 fL (ref 78.0–100.0)
PLATELETS: 371 10*3/uL (ref 150–400)
RBC: 4.27 MIL/uL (ref 3.87–5.11)
RDW: 12.6 % (ref 11.5–15.5)
WBC: 5.6 10*3/uL (ref 4.0–10.5)

## 2015-11-10 NOTE — ED Notes (Signed)
Pt states this morning around 2 am she started having chest pain and she checked her blood pressure and it was elevated and she was tachycardic  Pt states the pain was in her chest radiating down her left arm and into her back between her shoulder blades  Pt states she has a hx of arrythmias and has an appt with her cardiologist on Friday  Pt is also c/o nausea without vomiting

## 2015-11-10 NOTE — Telephone Encounter (Signed)
     I was paged by patient due to "problems with propranolol." She switched from metoprolol to propranolol as she was intolerant to metoprolol. She felt " hot on the inside." I reassured her that this was unlikely related to the drug and to continue on it. She was thankful for the call back and will continue the drug.    Angelena Form PA-C  [facility]

## 2015-11-10 NOTE — Discharge Instructions (Signed)

## 2015-11-10 NOTE — ED Provider Notes (Signed)
CSN: [id number]     Arrival date & time 11/10/15  [id number] History   First MD Initiated Contact with Patient 11/10/15 0730     Chief Complaint  Patient presents with  . Chest Pain      HPI Patient reports that she awoke this morning and had sharp left-sided chest discomfort and pain as well as associated palpitations.  She's had this multiple times in the past several months.  She is currently under the care of her primary care physician and cardiologist for palpitations and chest pain.  She has had an outpatient stress test which demonstrated no abnormalities.  She also had a recent Holter monitor placed which demonstrated sinus tachycardia only.  She feels like her heart rate went up to 160 at home today.  She had no syncope.  She feels better at this time.  She is currently being switched from metoprolol to propranolol for her palpitations.  She's been seen by her cardiologist in the past 2 weeks.  She reports some nausea without vomiting.  She denies diaphoresis.  No significant shortness breath.   Past Medical History  Diagnosis Date  . Esophageal stricture   . Chest pain 09/14/2009    no pulmonary embolus by chest CT  . Palpitations   . Elevated BP   . Anxiety     past Hx  . Depression     past Hx  . GERD (gastroesophageal reflux disease)     no meds  . Sickle cell trait ([place])   . Fibroid   . Abnormal Pap smear     cryo, normal since  . Fibroids 09/24/2015  . Hypertension    Past Surgical History  Procedure Laterality Date  . Cesarean section  2004  . Laparoscopy for ectopic pregnancy  2001  . Wisdom tooth extraction  2012   Family History  Problem Relation Age of Onset  . Asthma Mother   . Hypertension Mother   . Hypertension Paternal Uncle   . Asthma Maternal Grandmother   . Colon cancer Neg Hx   . Esophageal cancer Neg Hx   . Stomach cancer Neg Hx   . Asthma Daughter   . Thyroid disease Mother   . Heart Problems Mother    Social History  Substance Use Topics   . Smoking status: Never Smoker   . Smokeless tobacco: Never Used  . Alcohol Use: No   OB History    Gravida Para Term Preterm AB TAB SAB Ectopic Multiple Living   6 2 2  0 3 0 0 3 0 2     Review of Systems  All other systems reviewed and are negative.     Allergies  Hydromorphone; Morphine; and Diphenhydramine  Home Medications   Prior to Admission medications   Medication Sig Start Date End Date Taking? Authorizing Provider  aspirin 81 MG tablet Take 81 mg by mouth daily.   Yes Historical Provider, MD  clonazePAM (KLONOPIN) 0.5 MG tablet Take 1 tablet by mouth 2 (two) times daily as needed. anxiety 10/05/15  Yes Historical Provider, MD  famotidine (PEPCID) 20 MG tablet Take 20 mg by mouth daily as needed for heartburn or indigestion.   Yes Historical Provider, MD  hydrochlorothiazide (HYDRODIURIL) 12.5 MG tablet Take 1 tablet (12.5 mg total) by mouth daily. 10/28/15  Yes Brittainy Erie Noe, PA-C  metoprolol succinate (TOPROL-XL) 25 MG 24 hr tablet Take 12.5 mg by mouth 2 (two) times daily. 10/28/15  Yes Historical Provider, MD  misoprostol (CYTOTEC) 200  MCG tablet Take 3 pills by mouth the night before biopsy/Mirena. 11/04/15  Yes Emily Filbert, MD  propranolol (INDERAL) 20 MG tablet Take 1 tablet (20 mg total) by mouth 2 (two) times daily. 11/09/15  Yes Rhonda G Barrett, PA-C  Vitamin D, Ergocalciferol, (DRISDOL) 50000 UNITS CAPS capsule TAKE 1 CAPSULE BY MOUTH WEEKLY.- TUESDAYS 03/23/10  Yes Historical Provider, MD  potassium chloride (K-DUR) 10 MEQ tablet Take 1 tablet (10 mEq total) by mouth daily. Patient not taking: Reported on 11/04/2015 10/28/15   Brittainy M Simmons, PA-C   BP 131/86 mmHg  Pulse 98  Temp(Src) 98.3 F (36.8 C) (Oral)  Resp 14  SpO2 100%  LMP 11/03/2015 (Approximate) Physical Exam  Constitutional: She is oriented to person, place, and time. She appears well-developed and well-nourished. No distress.  HENT:  Head: Normocephalic and atraumatic.  Eyes:  EOM are normal.  Neck: Normal range of motion.  Cardiovascular: Normal rate, regular rhythm and normal heart sounds.   Pulmonary/Chest: Effort normal and breath sounds normal.  Abdominal: Soft. She exhibits no distension. There is no tenderness.  Musculoskeletal: Normal range of motion.  Neurological: She is alert and oriented to person, place, and time.  Skin: Skin is warm and dry.  Psychiatric:  Anxious appearing  Nursing note and vitals reviewed.   ED Course  Procedures (including critical care time) Labs Review Labs Reviewed  BASIC METABOLIC PANEL - Abnormal; Notable for the following:    Chloride 99 (*)    All other components within normal limits  CBC - Abnormal; Notable for the following:    HCT 35.0 (*)    All other components within normal limits  TROPONIN I    Imaging Review No results found. I have personally reviewed and evaluated these images and lab results as part of my medical decision-making.   EKG Interpretation   Date/Time:  Tuesday November 10 2015 07:09:22 EST Ventricular Rate:  89 PR Interval:  145 QRS Duration: 102 QT Interval:  361 QTC Calculation: 439 R Axis:   46 Text Interpretation:  Sinus rhythm RSR' in V1 or V2, right VCD or RVH  Baseline wander in lead(s) III No significant change was found Confirmed  by Namrata Dangler  MD, Remie Mathison ([id number]) on 11/10/2015 7:34:59 AM      MDM   Final diagnoses:  Palpitations    Long-standing history of recurrent chest pain palpitations.  She's been worked up on multiple occasions for this.  Her labs are without significant abnormality.  No clear etiology is likely to be determined here in the emergency department tonight.  I've encouraged ongoing follow-up with her primary care team as well as her cardiologist.  Her outpatient workups thus far been extremely reassuring.  Her EKG is without ischemic changes.  Discharge home in good condition.    Jola Schmidt, MD 11/10/15 [phone]

## 2015-11-12 ENCOUNTER — Encounter: Payer: Self-pay | Admitting: Cardiovascular Disease

## 2015-11-12 ENCOUNTER — Ambulatory Visit (INDEPENDENT_AMBULATORY_CARE_PROVIDER_SITE_OTHER): Payer: BLUE CROSS/BLUE SHIELD | Admitting: Psychology

## 2015-11-12 ENCOUNTER — Ambulatory Visit (INDEPENDENT_AMBULATORY_CARE_PROVIDER_SITE_OTHER): Payer: BLUE CROSS/BLUE SHIELD | Admitting: Cardiovascular Disease

## 2015-11-12 VITALS — BP 112/82 | HR 79 | Ht 66.0 in | Wt 191.9 lb

## 2015-11-12 DIAGNOSIS — R002 Palpitations: Secondary | ICD-10-CM | POA: Diagnosis not present

## 2015-11-12 DIAGNOSIS — R079 Chest pain, unspecified: Secondary | ICD-10-CM | POA: Diagnosis not present

## 2015-11-12 DIAGNOSIS — I10 Essential (primary) hypertension: Secondary | ICD-10-CM

## 2015-11-12 DIAGNOSIS — F329 Major depressive disorder, single episode, unspecified: Secondary | ICD-10-CM

## 2015-11-12 DIAGNOSIS — F411 Generalized anxiety disorder: Secondary | ICD-10-CM | POA: Diagnosis not present

## 2015-11-12 NOTE — Patient Instructions (Signed)
Labs- PLEASE GO TO LAB AND PICK UP SPECIMEN CONTAINER FOR 24 HOUR URINE.  NO OTHER CHANGES AT PRESENT  WILL CONTACT YOU ABOUT RESULTS  Your physician wants you to follow-up in 6 MONTHS Dr Pat Kocher will receive a reminder letter in the mail two months in advance. If you don't receive a letter, please call our office to schedule the follow-up appointment.   If you need a refill on your cardiac medications before your next appointment, please call your pharmacy.

## 2015-11-12 NOTE — Progress Notes (Signed)
Cardiology Office Note   Date:  11/12/2015   ID:  Stacy [name], DOB 26-Dec-1970, MRN [id number]  PCP:  Benito Mccreedy, MD  Cardiologist:   Sharol Harness, MD   Chief Complaint  Patient presents with  . Follow-up    post hosp//pt c/o burning chest pain and left arm pain; woke up with nausea last night  . Fatigue    pt states she is tired all the time  . Weight Loss    pt states she has no apetite, started some time within the last 30 days, she states she has to force herself to eat.      Patient ID: Stacy [name] is a 44 y.o. female with hypertension, obesity, anxiety and depression who presents for an evaluation of chest pain and shortness of breath.    Interval history 11/12/15: Since her last appointment, [name]-Trew had a stress test that was negative for ischemia.  She was hypertensive during her ETT, so she was started on HCTZ 12.5 mg daily.  She continues to have poorly-controlled blood pressure despite taking HCTZ and reducing her salt intake.  She also underwent a 7 day event monitor, during which she reported symptoms but no arrhythmias were noted.  Ms. Dumais has been seen in the ED 6 times with palpitations and near syncope.  Her home monitor has recorded blood pressures as high as 180/110 with a heart rate in the 160s. In the ED her vitals have been unremarkable.  She reports episodes of labile blood pressure. She was seen by her OB/GYN and initially had a normal blood pressure. However while sitting there she started to feel unwell.  She was noted to beta tachycardic with a heart rate of 126 and her blood pressure increased to 159/91.  He was referred to the emergency department were no abnormalities were noted other than a heart rate of 109.  She switched from metoprolol to propranolol and continues to feel fatigued.  Her symptoms are always worse at night.Sometimes she awakens with palpitations and her heart rate in the 120s. She  continues to have pain in her chest and radiating down into her left arm. She has no energy or appetite  TSH is normal.  At one of her ED visits she was started on metoprolol and followed up with Lyda Jester on 11/30.  At that appointment her ECG showed sinus tachycardia at 108 bpm.  She was seen by Lauraine Rinne, PA-C on 12/7, at which time she noted fatigue on metoprolol.  She was advised to continue metoprolol for at least a week before deciding to switch to propranol as recommended by her PCP.  This am she has GERD and feels generally unwell.  Ms. Hewins reports that he was unable to sleep during the sleep study.  The study was negative for obstructive sleep apnea.  Ms. Mcjunkins daughter, Stacy [name], was recently accepted to [employer]. She also was a reported nearly a full right scholarship.   History of Present Illness 09/16/15: She was evaluated in the ED on 9/9 for chest pain and shortness of breath.  Cardiac enzymes and EKG were unremarkable.  LE ultrasound was negative for DVT one week prior to her ED visit.  Ms. Verastegui reports that she has been feeling poorly for a while now.  For the last year she has noted intermittent palpitations and shortness of breath. The episodes occur 3-4 times per week and on days when they occur they happen several times throughout the  day. They're associated with lightheadedness and dizziness and last for a few seconds at a time. She denies chest pain, nausea or diaphoresis when this occurs. The symptoms typically occur at rest and not with exertion.  Ms. Kirkwood reports 3-4 months of significant fatigue. She is very tired halfway through her day at work and also with carrying groceries or doing light housework. She does not get any exercise. She denies chest pain with exertion. She also notes pain in bilateral shoulders radiates down into her arms. There is also associated numbness to her elbows. She endorses occasional lower  extremity edema the right leg. She did undergo ultrasound testing that was negative for DVT.   She reports seeing a cardiologist over one year ago.  She had a treadmill stress test that were reportedly normal.  She was told that she probably had normal skipped beats but did not have any ambulatory monitoring.   2 months of difficulty staying asleep.  Her daughter notes that she stops breathing overnight and this Pain-Snarski reports sometimes waking up gasping for air.  She has acid reflux and sometimes can't differentiate that from her heart.   Of note, her mother has heart disease and recently had a heart cath.  She does not know was found at the time of cardiac catheterization.  Past Medical History  Diagnosis Date  . Esophageal stricture   . Chest pain 09/14/2009    no pulmonary embolus by chest CT  . Palpitations   . Elevated BP   . Anxiety     past Hx  . Depression     past Hx  . GERD (gastroesophageal reflux disease)     no meds  . Sickle cell trait ([place])   . Fibroid   . Abnormal Pap smear     cryo, normal since  . Fibroids 09/24/2015  . Hypertension   . Essential hypertension 09/28/2009    Qualifier: Diagnosis of  By: [name] FNP, [place]      Past Surgical History  Procedure Laterality Date  . Cesarean section  2004  . Laparoscopy for ectopic pregnancy  2001  . Wisdom tooth extraction  2012     Current Outpatient Prescriptions  Medication Sig Dispense Refill  . aspirin 81 MG tablet Take 81 mg by mouth daily.    . clonazePAM (KLONOPIN) 0.5 MG tablet Take 1 tablet by mouth 2 (two) times daily as needed. anxiety  0  . famotidine (PEPCID) 20 MG tablet Take 20 mg by mouth daily as needed for heartburn or indigestion.    . hydrochlorothiazide (HYDRODIURIL) 12.5 MG tablet Take 1 tablet (12.5 mg total) by mouth daily. 30 tablet 6  . propranolol (INDERAL) 20 MG tablet Take 1 tablet (20 mg total) by mouth 2 (two) times daily. 60 tablet 6  . Vitamin D,  Ergocalciferol, (DRISDOL) 50000 UNITS CAPS capsule TAKE 1 CAPSULE BY MOUTH WEEKLY.- TUESDAYS     No current facility-administered medications for this visit.    Allergies:   Hydromorphone; Morphine; and Diphenhydramine    Social History:  The patient  reports that she has never smoked. She has never used smokeless tobacco. She reports that she does not drink alcohol or use illicit drugs.   Family History:  The patient's family history includes Asthma in her daughter, maternal grandmother, and mother; Heart Problems in her mother; Hypertension in her mother and paternal uncle; Thyroid disease in her mother. There is no history of Colon cancer, Esophageal cancer, or Stomach cancer.  ROS:  Please see the history of present illness.   Otherwise, review of systems are positive for none.   All other systems are reviewed and negative.    PHYSICAL EXAM: VS:  BP 112/82 mmHg  Pulse 79  Ht 5' 6"  (1.676 m)  Wt 87.045 kg (191 lb 14.4 oz)  BMI 30.99 kg/m2  LMP 11/03/2015 (Approximate) , BMI Body mass index is 30.99 kg/(m^2). GENERAL:  Well appearing HEENT:  Pupils equal round and reactive, fundi not visualized, oral mucosa unremarkable NECK:  No jugular venous distention, waveform within normal limits, carotid upstroke brisk and symmetric, no bruits, no thyromegaly LYMPHATICS:  No cervical adenopathy LUNGS:  Clear to auscultation bilaterally HEART:  RRR.  PMI not displaced or sustained,S1 and S2 within normal limits, no S3, no S4, no clicks, no rubs, no murmurs ABD:  Flat, positive bowel sounds normal in frequency in pitch, no bruits, no rebound, no guarding, no midline pulsatile mass, no hepatomegaly, no splenomegaly EXT:  2 plus pulses throughout, no edema, no cyanosis no clubbing SKIN:  No rashes no nodules NEURO:  Cranial nerves II through XII grossly intact, motor grossly intact throughout PSYCH:  Cognitively intact, oriented to person place and time    EKG:  EKG is not ordered  today. The ekg ordered 08/10/15 demonstrates sinus rhythm rate 75 bpm.  Incomplete RBBB.    ETT 10/15/15:   Blood pressure demonstrated a hypertensive response to exercise.  Upsloping ST segment depression ST segment depression of 2 mm was noted during stress in the II, III, aVF, V6, V4 and V5 leads.  The patient experienced no angina during the stress test.  Overall, the patient's exercise capacity was normal.  [facility] Treadmill Score: intermediate and low risk  Negative stress test without evidence of ischemia at given workload. Upsloping ST depression noted - may be due to LVH. Significant hypertension noted with rest BP 186/102 prior to exercise.  Echo 11/09/15: LVEF 76%.  Mild thickening of the mitral valve.  Trace MR and TR.  RVSP 28 mmHg.  7 Day Event Monitor 09/16/15:  Quality: Fair. Baseline artifact. Predominant rhythm: sinus rhythm Pauses >2.5 seconds: 0 No PVCs or PACs were noted.  Patient did submit a symptom diary. She reported heart racing, skipped beats, and chest pain. At those times the underlying rhythm was sinus rhythm with rates from 70-93 bpm and one episode of sinus tachycardia, rate 113 bpm.  Recent Labs: 08/07/2015: B Natriuretic Peptide 27.2 09/22/2015: TSH 1.010 10/27/2015: ALT 16 11/10/2015: BUN 9; Creatinine, Ser 0.79; Hemoglobin 12.2; Platelets 371; Potassium 3.6; Sodium 136    Lipid Panel    Component Value Date/Time   CHOL 178 09/28/2009 2307   TRIG 62 09/28/2009 2307   HDL 53 09/28/2009 2307   CHOLHDL 3.4 Ratio 09/28/2009 2307   VLDL 12 09/28/2009 2307   LDLCALC 113* 09/28/2009 2307      Wt Readings from Last 3 Encounters:  11/12/15 87.045 kg (191 lb 14.4 oz)  11/06/15 86.637 kg (191 lb)  11/04/15 87.091 kg (192 lb)      ASSESSMENT AND PLAN:  # Chest/arm pain: Symptoms are atypical and stress testing was negative.Her symptoms are not consistent with ischemia.  # Palpitations: 7 day event monitor did not reveal any abnormalities.  She did have symptoms, however she had sinus rhythm and sinus tachycardia due to the low 100s when she reported symptoms. Thyroid function and electrolytes are normal. She is not anemic. I suspect that her symptoms are not cardiac in  nature. We discussed anxiety or depression as possible causes. However she is not feeling particularly stressed or anxious.  # Fatigue/shortness of breath: The only abnormality thus far has been a mildly elevated ESR.  She recently followed up with a rheumatologist who felt that her symptoms were not consistent with autoimmune disease.  I do not have a cardiac explanation for her symptoms. Her echo is completely unremarkable. She has no sign of heart failure on exam.  # Apnea: Negative sleep study.  # Hypertension: Ms. Harston reports transient episodes of poorly controlled blood pressure. Her blood pressure is well controlled today. She does not have other symptoms for pheochromocytoma, but I do not have any other explanations for her episodic hypertension.  We will check 24-hour urine catecholamines and metanephrines.  Current medicines are reviewed at length with the patient today.  The patient does not have concerns regarding medicines.  The following changes have been made:  no change  Labs/ tests ordered today include:   Orders Placed This Encounter  Procedures  . Catecholamines, fractionated, Urine, 24 hour  . Metanephrines, Urine, 24 hour  . EKG 12-Lead     Disposition:   FU with Daphnee Preiss C. Oval Linsey, MD in 6 months.   Signed, Sharol Harness, MD  11/12/2015 12:57 PM    [place]

## 2015-11-13 ENCOUNTER — Ambulatory Visit: Payer: Self-pay | Admitting: Physician Assistant

## 2015-11-13 ENCOUNTER — Telehealth: Payer: Self-pay | Admitting: Cardiovascular Disease

## 2015-11-13 NOTE — Telephone Encounter (Signed)
Patient was concerned  Blood pressure  Reading were not consistently the same "it was a big spread from systolic and diastolic blood pressure"  130-140/65-70 RN reassured patient  -That blood pressure were good  would like blood pressure to be below 140/90 She verbalized understanding

## 2015-11-13 NOTE — Telephone Encounter (Signed)
Pt called in stating that her Systolic number has been consistantly high and she wanted to be advised on what to do. Please f/u  Thanks

## 2015-11-16 ENCOUNTER — Ambulatory Visit (INDEPENDENT_AMBULATORY_CARE_PROVIDER_SITE_OTHER): Payer: BLUE CROSS/BLUE SHIELD | Admitting: Psychology

## 2015-11-16 DIAGNOSIS — F411 Generalized anxiety disorder: Secondary | ICD-10-CM | POA: Diagnosis not present

## 2015-11-16 DIAGNOSIS — F329 Major depressive disorder, single episode, unspecified: Secondary | ICD-10-CM | POA: Diagnosis not present

## 2015-11-20 ENCOUNTER — Telehealth: Payer: Self-pay | Admitting: Cardiovascular Disease

## 2015-11-20 NOTE — Telephone Encounter (Signed)
Spoke to patient  Reassured patient okay to use medication as prescribed by primary PA Reassured about heart rate- the norm is between 60 -100. Heart rate  may increase ( i.e.. Anxiety , rushing, increase activity).

## 2015-11-20 NOTE — Telephone Encounter (Signed)
Pt called in inquiring about a new medication that the PA at her PCP's office is wanting to start her on. She says that the PA wanted to start her on Ativan and discontinue her Klonopin. She wanted to make sure that would be all right and she alos says that her HR has been in the 90's to the 100's. Please f/u   Thanks

## 2015-11-21 ENCOUNTER — Emergency Department (HOSPITAL_COMMUNITY)
Admission: EM | Admit: 2015-11-21 | Discharge: 2015-11-21 | Disposition: A | Discharge status: Home / Self Care | Payer: BLUE CROSS/BLUE SHIELD | Attending: Emergency Medicine | Admitting: Emergency Medicine

## 2015-11-21 ENCOUNTER — Emergency Department (HOSPITAL_COMMUNITY): Payer: BLUE CROSS/BLUE SHIELD

## 2015-11-21 ENCOUNTER — Encounter (HOSPITAL_COMMUNITY): Payer: Self-pay

## 2015-11-21 DIAGNOSIS — Z862 Personal history of diseases of the blood and blood-forming organs and certain disorders involving the immune mechanism: Secondary | ICD-10-CM | POA: Diagnosis not present

## 2015-11-21 DIAGNOSIS — R079 Chest pain, unspecified: Secondary | ICD-10-CM

## 2015-11-21 DIAGNOSIS — I10 Essential (primary) hypertension: Secondary | ICD-10-CM | POA: Diagnosis not present

## 2015-11-21 DIAGNOSIS — Z7982 Long term (current) use of aspirin: Secondary | ICD-10-CM | POA: Insufficient documentation

## 2015-11-21 DIAGNOSIS — F419 Anxiety disorder, unspecified: Secondary | ICD-10-CM | POA: Insufficient documentation

## 2015-11-21 DIAGNOSIS — R05 Cough: Secondary | ICD-10-CM | POA: Insufficient documentation

## 2015-11-21 DIAGNOSIS — Z86018 Personal history of other benign neoplasm: Secondary | ICD-10-CM | POA: Insufficient documentation

## 2015-11-21 DIAGNOSIS — E876 Hypokalemia: Secondary | ICD-10-CM

## 2015-11-21 DIAGNOSIS — Z79899 Other long term (current) drug therapy: Secondary | ICD-10-CM | POA: Diagnosis not present

## 2015-11-21 DIAGNOSIS — F329 Major depressive disorder, single episode, unspecified: Secondary | ICD-10-CM | POA: Insufficient documentation

## 2015-11-21 DIAGNOSIS — K219 Gastro-esophageal reflux disease without esophagitis: Secondary | ICD-10-CM | POA: Diagnosis not present

## 2015-11-21 DIAGNOSIS — R0602 Shortness of breath: Secondary | ICD-10-CM | POA: Insufficient documentation

## 2015-11-21 LAB — CBC
HCT: 36.4 % (ref 36.0–46.0)
HEMOGLOBIN: 12.6 g/dL (ref 12.0–15.0)
MCH: 28.7 pg (ref 26.0–34.0)
MCHC: 34.6 g/dL (ref 30.0–36.0)
MCV: 82.9 fL (ref 78.0–100.0)
Platelets: 415 10*3/uL — ABNORMAL HIGH (ref 150–400)
RBC: 4.39 MIL/uL (ref 3.87–5.11)
RDW: 12.5 % (ref 11.5–15.5)
WBC: 7.5 10*3/uL (ref 4.0–10.5)

## 2015-11-21 LAB — BASIC METABOLIC PANEL
ANION GAP: 10 (ref 5–15)
BUN: 11 mg/dL (ref 6–20)
CALCIUM: 9.3 mg/dL (ref 8.9–10.3)
CO2: 26 mmol/L (ref 22–32)
Chloride: 102 mmol/L (ref 101–111)
Creatinine, Ser: 0.74 mg/dL (ref 0.44–1.00)
GLUCOSE: 85 mg/dL (ref 65–99)
Potassium: 3.2 mmol/L — ABNORMAL LOW (ref 3.5–5.1)
SODIUM: 138 mmol/L (ref 135–145)

## 2015-11-21 LAB — I-STAT TROPONIN, ED: Troponin i, poc: 0 ng/mL (ref 0.00–0.08)

## 2015-11-21 MED ORDER — POTASSIUM CHLORIDE CRYS ER 20 MEQ PO TBCR
40.0000 meq | EXTENDED_RELEASE_TABLET | Freq: Once | ORAL | Status: AC
  Administered 2015-11-21: 40 meq via ORAL
  Filled 2015-11-21: qty 2

## 2015-11-21 NOTE — ED Provider Notes (Addendum)
CSN: [id number]     Arrival date & time 11/21/15  1448 History   First MD Initiated Contact with Patient 11/21/15 1809     Chief Complaint  Patient presents with  . Chest Pain     (Consider location/radiation/quality/duration/timing/severity/associated sxs/prior Treatment) HPI 44 year old female presents with chest pain. Around 2 AM she woke up with chest burning that she has nearly every day. However at this time she also had left arm pain that was dull and achy. This lasted about 3 hours. She became concerned that she called her cardiologist who advised her to go get tested for a heart attack. She recently had a negative stress test. She states she has never had the arm symptoms before but the chest burning is similar to prior GERD. Her GERD medicines typically total work and did not work today as well. She has some short of breath with the arm pain but the shortness breath is gone. Has really been having some postnasal drip but otherwise no cough, fever, or congestion. Currently feels at her baseline.  Past Medical History  Diagnosis Date  . Esophageal stricture   . Chest pain 09/14/2009    no pulmonary embolus by chest CT  . Palpitations   . Elevated BP   . Anxiety     past Hx  . Depression     past Hx  . GERD (gastroesophageal reflux disease)     no meds  . Sickle cell trait ([place])   . Fibroid   . Abnormal Pap smear     cryo, normal since  . Fibroids 09/24/2015  . Hypertension   . Essential hypertension 09/28/2009    Qualifier: Diagnosis of  By: [name] FNP, [place]     Past Surgical History  Procedure Laterality Date  . Cesarean section  2004  . Laparoscopy for ectopic pregnancy  2001  . Wisdom tooth extraction  2012   Family History  Problem Relation Age of Onset  . Asthma Mother   . Hypertension Mother   . Hypertension Paternal Uncle   . Asthma Maternal Grandmother   . Colon cancer Neg Hx   . Esophageal cancer Neg Hx   . Stomach cancer Neg Hx   . Asthma  Daughter   . Thyroid disease Mother   . Heart Problems Mother    Social History  Substance Use Topics  . Smoking status: Never Smoker   . Smokeless tobacco: Never Used  . Alcohol Use: No   OB History    Gravida Para Term Preterm AB TAB SAB Ectopic Multiple Living   6 2 2  0 3 0 0 3 0 2     Review of Systems  Constitutional: Negative for fever.  Respiratory: Positive for cough and shortness of breath.   Cardiovascular: Positive for chest pain. Negative for leg swelling.  Gastrointestinal: Negative for vomiting.  Musculoskeletal: Positive for myalgias.  Neurological: Negative for weakness and numbness.  All other systems reviewed and are negative.     Allergies  Hydromorphone; Morphine; and Diphenhydramine  Home Medications   Prior to Admission medications   Medication Sig Start Date End Date Taking? Authorizing Provider  aspirin 81 MG tablet Take 81 mg by mouth daily.    Historical Provider, MD  clonazePAM (KLONOPIN) 0.5 MG tablet Take 1 tablet by mouth 2 (two) times daily as needed. anxiety 10/05/15   Historical Provider, MD  famotidine (PEPCID) 20 MG tablet Take 20 mg by mouth daily as needed for heartburn or indigestion.  Historical Provider, MD  hydrochlorothiazide (HYDRODIURIL) 12.5 MG tablet Take 1 tablet (12.5 mg total) by mouth daily. 10/28/15   Brittainy Erie Noe, PA-C  propranolol (INDERAL) 20 MG tablet Take 1 tablet (20 mg total) by mouth 2 (two) times daily. 11/09/15   Rhonda G Barrett, PA-C  Vitamin D, Ergocalciferol, (DRISDOL) 50000 UNITS CAPS capsule TAKE 1 CAPSULE BY MOUTH WEEKLY.- TUESDAYS 03/23/10   Historical Provider, MD   BP 143/97 mmHg  Pulse 91  Temp(Src) 98.1 F (36.7 C) (Oral)  Resp 18  SpO2 100%  LMP 11/03/2015 (Approximate) Physical Exam  Constitutional: She is oriented to person, place, and time. She appears well-developed and well-nourished.  HENT:  Head: Normocephalic and atraumatic.  Right Ear: External ear normal.  Left Ear: External  ear normal.  Nose: Nose normal.  Eyes: Right eye exhibits no discharge. Left eye exhibits no discharge.  Cardiovascular: Normal rate, regular rhythm and normal heart sounds.   Pulses:      Radial pulses are 2+ on the right side, and 2+ on the left side.  Pulmonary/Chest: Effort normal and breath sounds normal.  Abdominal: Soft. There is no tenderness.  Musculoskeletal:  No swelling or tenderness over left upper extremity  Neurological: She is alert and oriented to person, place, and time.  Skin: Skin is warm and dry.  Nursing note and vitals reviewed.   ED Course  Procedures (including critical care time) Labs Review Labs Reviewed  BASIC METABOLIC PANEL - Abnormal; Notable for the following:    Potassium 3.2 (*)    All other components within normal limits  CBC - Abnormal; Notable for the following:    Platelets 415 (*)    All other components within normal limits  I-STAT TROPOININ, ED    Imaging Review Dg Chest 2 View  11/21/2015  CLINICAL DATA:  Esophageal discomfort for the last 12 hours. EXAM: CHEST  2 VIEW COMPARISON:  11/10/2015 FINDINGS: Cardiomediastinal silhouette is normal. Mediastinal contours appear intact. There is no evidence of focal airspace consolidation, pleural effusion or pneumothorax. Osseous structures are without acute abnormality. Soft tissues are grossly normal. IMPRESSION: No active cardiopulmonary disease. Electronically Signed   By: Fidela Salisbury M.D.   On: 11/21/2015 15:18   I have personally reviewed and evaluated these images and lab results as part of my medical decision-making.   EKG Interpretation   Date/Time:  Saturday November 21 2015 15:02:12 EST Ventricular Rate:  94 PR Interval:  140 QRS Duration: 97 QT Interval:  350 QTC Calculation: 438 R Axis:   37 Text Interpretation:  Sinus rhythm Probable anteroseptal infarct, old no  acute ST/T changes no significant change since Nov 10 2015 Confirmed by  Regenia Skeeter  MD, Jaquasha Carnevale [phone]) on  11/21/2015 6:11:25 PM      MDM   Final diagnoses:  Chest pain, unspecified chest pain type  Hypokalemia    Patient with atypical chest pain as well as left arm pain. The symptoms have been gone for about 13 hours. At the 10 hour mark she had a troponin drawn that was negative. EKG is unremarkable and not changed since earlier in the month. This seems atypical for ACS, especially given she has no known coronary disease and a recent negative stress test. I discussed possible repeat troponin testing but she has a sick child at home and was to go home. Given that her pain is been gone for 10 hours and the troponin was negative I have very low suspicion for missed MI. Dissection or PE  seems unlikely. No weakness to suggest a stroke or cervical radiculopathy. Given benign exam, recommend follow-up with her cardiologist as well as return precautions.    Sherwood Gambler, MD 11/21/15 1855  As patient was being discharged she started complaining of dizziness. Feels like palpitations that she needs to take propranolol for. She has a HR of 105, repeat EKG shows no new arrythmias or abnormalities. After propranolol, she feels much better and wants to go home   EKG Interpretation  Date/Time:  Saturday November 21 2015 19:23:22 EST Ventricular Rate:  98 PR Interval:  149 QRS Duration: 102 QT Interval:  344 QTC Calculation: 439 R Axis:   44 Text Interpretation:  Sinus rhythm RSR' in V1 or V2, right VCD or RVH no significant change since earlier in the day Confirmed by Amiri Tritch  MD, Hildur Bayer [phone]) on 11/21/2015 7:50:33 PM        Sherwood Gambler, MD 11/21/15 1954

## 2015-11-21 NOTE — ED Notes (Addendum)
Pt left prior to receiving discharge instructions. Pt last seen ambulating in the hallway without any issues.

## 2015-11-21 NOTE — ED Notes (Signed)
She c/o epigastric and left chest discomfort which started at about 2am today and persists.  Seen at a minor emerg. Who sent her here for eval.  EKG performed at triage.  She states she sees Dr. Oval Linsey [facility] for on-going cardiac care.  She also states she recently began taking inderal and has impending upper endoscopy to monitor her GERD.  She is in no distress.

## 2015-11-24 ENCOUNTER — Other Ambulatory Visit (HOSPITAL_COMMUNITY): Payer: Self-pay | Admitting: Adult Health

## 2015-11-24 DIAGNOSIS — R609 Edema, unspecified: Secondary | ICD-10-CM

## 2015-11-25 ENCOUNTER — Emergency Department (HOSPITAL_COMMUNITY)
Admission: EM | Admit: 2015-11-25 | Discharge: 2015-11-25 | Disposition: A | Discharge status: Home / Self Care | Payer: BLUE CROSS/BLUE SHIELD | Attending: Emergency Medicine | Admitting: Emergency Medicine

## 2015-11-25 ENCOUNTER — Ambulatory Visit (HOSPITAL_BASED_OUTPATIENT_CLINIC_OR_DEPARTMENT_OTHER)
Admission: RE | Admit: 2015-11-25 | Discharge: 2015-11-25 | Disposition: A | Discharge status: Home / Self Care | Payer: BLUE CROSS/BLUE SHIELD | Source: Ambulatory Visit | Attending: Internal Medicine | Admitting: Internal Medicine

## 2015-11-25 ENCOUNTER — Emergency Department (HOSPITAL_COMMUNITY): Payer: BLUE CROSS/BLUE SHIELD

## 2015-11-25 ENCOUNTER — Encounter (HOSPITAL_COMMUNITY): Payer: Self-pay | Admitting: Emergency Medicine

## 2015-11-25 DIAGNOSIS — F419 Anxiety disorder, unspecified: Secondary | ICD-10-CM | POA: Insufficient documentation

## 2015-11-25 DIAGNOSIS — R1013 Epigastric pain: Secondary | ICD-10-CM | POA: Diagnosis not present

## 2015-11-25 DIAGNOSIS — R609 Edema, unspecified: Secondary | ICD-10-CM | POA: Diagnosis not present

## 2015-11-25 DIAGNOSIS — M79605 Pain in left leg: Secondary | ICD-10-CM | POA: Insufficient documentation

## 2015-11-25 DIAGNOSIS — R110 Nausea: Secondary | ICD-10-CM | POA: Diagnosis not present

## 2015-11-25 DIAGNOSIS — R079 Chest pain, unspecified: Secondary | ICD-10-CM

## 2015-11-25 DIAGNOSIS — F329 Major depressive disorder, single episode, unspecified: Secondary | ICD-10-CM | POA: Insufficient documentation

## 2015-11-25 DIAGNOSIS — R109 Unspecified abdominal pain: Secondary | ICD-10-CM

## 2015-11-25 DIAGNOSIS — Z7982 Long term (current) use of aspirin: Secondary | ICD-10-CM | POA: Insufficient documentation

## 2015-11-25 DIAGNOSIS — Z86018 Personal history of other benign neoplasm: Secondary | ICD-10-CM | POA: Insufficient documentation

## 2015-11-25 DIAGNOSIS — I10 Essential (primary) hypertension: Secondary | ICD-10-CM | POA: Diagnosis not present

## 2015-11-25 DIAGNOSIS — R0602 Shortness of breath: Secondary | ICD-10-CM | POA: Insufficient documentation

## 2015-11-25 DIAGNOSIS — E876 Hypokalemia: Secondary | ICD-10-CM | POA: Insufficient documentation

## 2015-11-25 DIAGNOSIS — Z79899 Other long term (current) drug therapy: Secondary | ICD-10-CM | POA: Diagnosis not present

## 2015-11-25 DIAGNOSIS — R1011 Right upper quadrant pain: Secondary | ICD-10-CM | POA: Diagnosis not present

## 2015-11-25 DIAGNOSIS — K219 Gastro-esophageal reflux disease without esophagitis: Secondary | ICD-10-CM | POA: Diagnosis not present

## 2015-11-25 DIAGNOSIS — M7989 Other specified soft tissue disorders: Secondary | ICD-10-CM | POA: Insufficient documentation

## 2015-11-25 DIAGNOSIS — Z3202 Encounter for pregnancy test, result negative: Secondary | ICD-10-CM | POA: Insufficient documentation

## 2015-11-25 HISTORY — DX: Cardiac arrhythmia, unspecified: I49.9

## 2015-11-25 LAB — BASIC METABOLIC PANEL
ANION GAP: 7 (ref 5–15)
BUN: 7 mg/dL (ref 6–20)
CO2: 29 mmol/L (ref 22–32)
Calcium: 9.2 mg/dL (ref 8.9–10.3)
Chloride: 100 mmol/L — ABNORMAL LOW (ref 101–111)
Creatinine, Ser: 0.86 mg/dL (ref 0.44–1.00)
GLUCOSE: 100 mg/dL — AB (ref 65–99)
POTASSIUM: 3.7 mmol/L (ref 3.5–5.1)
Sodium: 136 mmol/L (ref 135–145)

## 2015-11-25 LAB — CBC
HEMATOCRIT: 34.4 % — AB (ref 36.0–46.0)
HEMOGLOBIN: 11.9 g/dL — AB (ref 12.0–15.0)
MCH: 28.7 pg (ref 26.0–34.0)
MCHC: 34.6 g/dL (ref 30.0–36.0)
MCV: 82.9 fL (ref 78.0–100.0)
Platelets: 355 10*3/uL (ref 150–400)
RBC: 4.15 MIL/uL (ref 3.87–5.11)
RDW: 12.6 % (ref 11.5–15.5)
WBC: 7.9 10*3/uL (ref 4.0–10.5)

## 2015-11-25 LAB — URINALYSIS, ROUTINE W REFLEX MICROSCOPIC
Bilirubin Urine: NEGATIVE
GLUCOSE, UA: NEGATIVE mg/dL
Hgb urine dipstick: NEGATIVE
KETONES UR: NEGATIVE mg/dL
LEUKOCYTES UA: NEGATIVE
NITRITE: NEGATIVE
PROTEIN: NEGATIVE mg/dL
Specific Gravity, Urine: 1.01 (ref 1.005–1.030)
pH: 7 (ref 5.0–8.0)

## 2015-11-25 LAB — I-STAT TROPONIN, ED: Troponin i, poc: 0 ng/mL (ref 0.00–0.08)

## 2015-11-25 LAB — POC URINE PREG, ED: PREG TEST UR: NEGATIVE

## 2015-11-25 MED ORDER — KETOROLAC TROMETHAMINE 30 MG/ML IJ SOLN
30.0000 mg | Freq: Once | INTRAMUSCULAR | Status: AC
  Administered 2015-11-25: 30 mg via INTRAVENOUS
  Filled 2015-11-25: qty 1

## 2015-11-25 MED ORDER — IOHEXOL 300 MG/ML  SOLN: 100.0000 mL | Freq: Once | INTRAMUSCULAR | Status: AC | PRN

## 2015-11-25 MED ORDER — ONDANSETRON 8 MG PO TBDP: 8.0000 mg | ORAL_TABLET | Freq: Three times a day (TID) | ORAL | Status: DC | PRN

## 2015-11-25 MED ORDER — GI COCKTAIL ~~LOC~~
30.0000 mL | Freq: Once | ORAL | Status: AC
  Administered 2015-11-25: 30 mL via ORAL
  Filled 2015-11-25: qty 30

## 2015-11-25 MED ORDER — ONDANSETRON HCL 4 MG/2ML IJ SOLN
4.0000 mg | Freq: Once | INTRAMUSCULAR | Status: AC
  Administered 2015-11-25: 4 mg via INTRAVENOUS
  Filled 2015-11-25: qty 2

## 2015-11-25 MED ORDER — MORPHINE SULFATE (PF) 4 MG/ML IV SOLN
4.0000 mg | Freq: Once | INTRAVENOUS | Status: DC
Start: 2015-11-25 — End: 2015-11-25
  Filled 2015-11-25: qty 1

## 2015-11-25 MED ORDER — SODIUM CHLORIDE 0.9 % IV BOLUS (SEPSIS)
1000.0000 mL | Freq: Once | INTRAVENOUS | Status: AC
  Administered 2015-11-25: 1000 mL via INTRAVENOUS

## 2015-11-25 NOTE — ED Notes (Signed)
Pt. Reports central chest pain with SOB and nausea onset this evening , pt. added mid abdomional pain this evening , denies fever or diarrhea.

## 2015-11-25 NOTE — ED Notes (Signed)
Patient transported to CT 

## 2015-11-25 NOTE — Discharge Instructions (Signed)

## 2015-11-25 NOTE — ED Notes (Signed)
Pt departed in NAD.  

## 2015-11-25 NOTE — ED Provider Notes (Signed)
CSN: [id number]     Arrival date & time 11/25/15  0107 History  By signing my name below, I, Stacy [name], attest that this documentation has been prepared under the direction and in the presence of Jola Schmidt, MD. Electronically Signed: Altamease [name], ED Scribe. 11/25/2015. 4:02 AM  Chief Complaint  Patient presents with  . Chest Pain  . Abdominal Pain     The history is provided by the patient. No language interpreter was used.   Stacy [name] is a 44 y.o. female with history of GERD, esophageal stricture, and arrhythmia who presents to the Emergency Department complaining of abdominal pain that woke her from sleeping tonight. The pain radiates to the chest. Pt states that she has had intermittent chest pain for 3 months and is being treated with propanolol and a beta blocker. Associated symptoms include nausea and SOB with onset 3-4 days ago. She was seen by her PCP today for SOB where her lab work reportedly showed hypokalemia. Pt denies vomiting.   Past Medical History  Diagnosis Date  . Esophageal stricture   . Chest pain 09/14/2009    no pulmonary embolus by chest CT  . Palpitations   . Elevated BP   . Anxiety     past Hx  . Depression     past Hx  . GERD (gastroesophageal reflux disease)     no meds  . Sickle cell trait ([place])   . Fibroid   . Abnormal Pap smear     cryo, normal since  . Fibroids 09/24/2015  . Hypertension   . Essential hypertension 09/28/2009    Qualifier: Diagnosis of  By: [name] FNP, [place]    . Arrhythmia    Past Surgical History  Procedure Laterality Date  . Cesarean section  2004  . Laparoscopy for ectopic pregnancy  2001  . Wisdom tooth extraction  2012   Family History  Problem Relation Age of Onset  . Asthma Mother   . Hypertension Mother   . Hypertension Paternal Uncle   . Asthma Maternal Grandmother   . Colon cancer Neg Hx   . Esophageal cancer Neg Hx   . Stomach cancer Neg Hx   . Asthma Daughter   .  Thyroid disease Mother   . Heart Problems Mother    Social History  Substance Use Topics  . Smoking status: Never Smoker   . Smokeless tobacco: Never Used  . Alcohol Use: No   OB History    Gravida Para Term Preterm AB TAB SAB Ectopic Multiple Living   6 2 2  0 3 0 0 3 0 2     Review of Systems 10 Systems reviewed and all are negative for acute change except as noted in the HPI.  Allergies  Hydromorphone; Morphine; and Diphenhydramine  Home Medications   Prior to Admission medications   Medication Sig Start Date End Date Taking? Authorizing Provider  aspirin 81 MG tablet Take 81 mg by mouth daily.   Yes Historical Provider, MD  clonazePAM (KLONOPIN) 0.5 MG tablet Take 1 tablet by mouth 2 (two) times daily as needed. anxiety 10/05/15  Yes Historical Provider, MD  famotidine (PEPCID) 20 MG tablet Take 20 mg by mouth daily as needed for heartburn or indigestion.   Yes Historical Provider, MD  hydrochlorothiazide (HYDRODIURIL) 12.5 MG tablet Take 1 tablet (12.5 mg total) by mouth daily. 10/28/15  Yes Brittainy Erie Noe, PA-C  propranolol (INDERAL) 20 MG tablet Take 1 tablet (20 mg total) by mouth  2 (two) times daily. Patient taking differently: Take 10 mg by mouth 2 (two) times daily.  11/09/15  Yes Rhonda G Barrett, PA-C  Vitamin D, Ergocalciferol, (DRISDOL) 50000 UNITS CAPS capsule TAKE 1 CAPSULE BY MOUTH WEEKLY.- TUESDAYS 03/23/10  Yes Historical Provider, MD   BP 140/96 mmHg  Pulse 92  Temp(Src) 98 F (36.7 C) (Oral)  Resp 20  Ht 5\' 6"  (1.676 m)  Wt 192 lb (87.091 kg)  BMI 31.00 kg/m2  SpO2 100%  LMP 11/03/2015 (Approximate) Physical Exam  Constitutional: She is oriented to person, place, and time. She appears well-developed and well-nourished. No distress.  HENT:  Head: Normocephalic and atraumatic.  Eyes: EOM are normal.  Neck: Normal range of motion.  Cardiovascular: Normal rate, regular rhythm and normal heart sounds.   Pulmonary/Chest: Effort normal and breath  sounds normal.  Abdominal: Soft. She exhibits no distension. There is tenderness (mild) in the right upper quadrant and epigastric area.  Musculoskeletal: Normal range of motion.  Neurological: She is alert and oriented to person, place, and time.  Skin: Skin is warm and dry.  Psychiatric: She has a normal mood and affect. Judgment normal.  Nursing note and vitals reviewed.   ED Course  Procedures (including critical care time) DIAGNOSTIC STUDIES: Oxygen Saturation is 100% on RA,  normal by my interpretation.    COORDINATION OF CARE: 2:23 AM Discussed treatment plan which includes lab work, CXR, EKG, CT A/P and pain management with pt at bedside and pt agreed to plan. Labs Review Labs Reviewed  BASIC METABOLIC PANEL - Abnormal; Notable for the following:    Chloride 100 (*)    Glucose, Bld 100 (*)    All other components within normal limits  CBC - Abnormal; Notable for the following:    Hemoglobin 11.9 (*)    HCT 34.4 (*)    All other components within normal limits  URINALYSIS, ROUTINE W REFLEX MICROSCOPIC (NOT AT [facility])  Randolm Idol, ED  POC URINE PREG, ED    Imaging Review Dg Chest 2 View  11/25/2015  CLINICAL DATA:  Acute onset of generalized chest and abdominal pain. Nausea and constipation. Initial encounter. EXAM: CHEST  2 VIEW COMPARISON:  Chest radiograph from 11/21/2015 FINDINGS: The lungs are well-aerated and clear. There is no evidence of focal opacification, pleural effusion or pneumothorax. The heart is normal in size; the mediastinal contour is within normal limits. No acute osseous abnormalities are seen. IMPRESSION: No acute cardiopulmonary process seen. Electronically Signed   By: Garald Balding M.D.   On: 11/25/2015 02:05   Ct Abdomen Pelvis W Contrast  11/25/2015  CLINICAL DATA:  44 year old female with mid abdominal pain. EXAM: CT ABDOMEN AND PELVIS WITH CONTRAST TECHNIQUE: Multidetector CT imaging of the abdomen and pelvis was performed using the  standard protocol following bolus administration of intravenous contrast. CONTRAST:  100 cc Omnipaque 300 COMPARISON:  Pelvic ultrasound dated 10/06/2015 and CT dated 05/22/2012 FINDINGS: The visualized lung bases are clear. No intra-abdominal free air or free fluid. New the liver, gallbladder, pancreas, spleen, adrenal glands, kidneys, visualized ureters, and urinary bladder appear unremarkable. The uterus is enlarged, heterogeneous, and myomatous. The largest fibroid measures approximately 5 cm in the posterior uterine fundus. This fibroid was smaller on the prior CT dated 05/22/2012 and not as conspicuous and likely measured 3 cm on that study. There has been significant interval increase in the size of the uterine fibroids compared to the study dated 05/22/2012. Constipation. There is no evidence of bowel obstruction  or inflammation. Normal appendix. The abdominal aorta and IVC appear patent. No portal venous gas identified. There is no adenopathy. The abdominal wall soft tissues and osseous structures appear unremarkable. IMPRESSION: Enlarged myomatous uterus with interval increase in the size of the fibroids compared to the study dated 05/22/2012 clinical correlation and follow-up recommended. MRI may provide better evaluation of the uterus and the fibroids. Constipation. No evidence of bowel obstruction or inflammation. Normal appendix. Electronically Signed   By: Anner Crete M.D.   On: 11/25/2015 03:52   I have personally reviewed and evaluated these images and lab results as part of my medical decision-making.   EKG Interpretation   Date/Time:  Wednesday November 25 2015 01:07:47 EST Ventricular Rate:  87 PR Interval:  142 QRS Duration: 94 QT Interval:  360 QTC Calculation: 433 R Axis:   62 Text Interpretation:  Normal sinus rhythm Incomplete right bundle branch  block Borderline ECG No significant change was found Confirmed by Saulo Anthis   MD, Hallie Ishida ([id number]) on 11/25/2015 6:38:37 AM       MDM   Final diagnoses:  Abdominal pain, unspecified abdominal location  Chest pain, unspecified chest pain type    6:37 AM Patient feels better this time.  Discharge home in good condition.  Doubt acute coronary syndrome.  EKG without ischemic changes.  Chest x-ray without abnormalities.  CT scan without abnormalities.  Referral back to primary care and GI.  Patient understands return to the ER for new or worsening symptoms.  I personally performed the services described in this documentation, which was scribed in my presence. The recorded information has been reviewed and is accurate.       Jola Schmidt, MD 11/25/15 [phone]

## 2015-11-25 NOTE — Progress Notes (Signed)
VASCULAR LAB PRELIMINARY  PRELIMINARY  PRELIMINARY  PRELIMINARY  Left lower extremity venous duplex completed.    Preliminary report:  Left:  No evidence of DVT, superficial thrombosis, or Baker's cyst.  Gunhild Bautch, RVS 11/25/2015, 9:31 AM

## 2015-11-26 ENCOUNTER — Telehealth: Payer: Self-pay | Admitting: Cardiovascular Disease

## 2015-11-26 NOTE — Telephone Encounter (Signed)
Patient states she was concerned if she should increase propranolol form 10 mg twice a day.( medication was decrease from original dose) She states blood pressure is up to 154/95 today, yesterday it was 140/90. She states she takes her blood pressure twice a day. The 2 days ago  it was normal. She did state she ate soup and sandwich today. RN informed her it maybe what she has eaten in the  last couple days  RN reassured patient , keep taking medication , continue to monitor over the weekend- need  more of a trend higher pressures. Contact office next week.if pressure continue to be elevated. She verbalized understanding.

## 2015-11-26 NOTE — Telephone Encounter (Signed)
Stacy [name] is calling because she has a question about her blood pressure medication .Marland Kitchen Please call   Thanks

## 2015-11-27 ENCOUNTER — Emergency Department (HOSPITAL_COMMUNITY)
Admission: EM | Admit: 2015-11-27 | Discharge: 2015-11-28 | Disposition: A | Discharge status: Home / Self Care | Payer: BLUE CROSS/BLUE SHIELD | Attending: Emergency Medicine | Admitting: Emergency Medicine

## 2015-11-27 ENCOUNTER — Encounter (HOSPITAL_COMMUNITY): Payer: Self-pay

## 2015-11-27 ENCOUNTER — Emergency Department (HOSPITAL_COMMUNITY): Payer: BLUE CROSS/BLUE SHIELD

## 2015-11-27 DIAGNOSIS — F419 Anxiety disorder, unspecified: Secondary | ICD-10-CM | POA: Diagnosis not present

## 2015-11-27 DIAGNOSIS — Z86018 Personal history of other benign neoplasm: Secondary | ICD-10-CM | POA: Diagnosis not present

## 2015-11-27 DIAGNOSIS — Z79899 Other long term (current) drug therapy: Secondary | ICD-10-CM | POA: Insufficient documentation

## 2015-11-27 DIAGNOSIS — R079 Chest pain, unspecified: Secondary | ICD-10-CM | POA: Diagnosis present

## 2015-11-27 DIAGNOSIS — Z862 Personal history of diseases of the blood and blood-forming organs and certain disorders involving the immune mechanism: Secondary | ICD-10-CM | POA: Diagnosis not present

## 2015-11-27 DIAGNOSIS — I10 Essential (primary) hypertension: Secondary | ICD-10-CM | POA: Insufficient documentation

## 2015-11-27 DIAGNOSIS — I499 Cardiac arrhythmia, unspecified: Secondary | ICD-10-CM | POA: Diagnosis not present

## 2015-11-27 DIAGNOSIS — Z7982 Long term (current) use of aspirin: Secondary | ICD-10-CM | POA: Insufficient documentation

## 2015-11-27 DIAGNOSIS — F329 Major depressive disorder, single episode, unspecified: Secondary | ICD-10-CM | POA: Diagnosis not present

## 2015-11-27 DIAGNOSIS — K219 Gastro-esophageal reflux disease without esophagitis: Secondary | ICD-10-CM | POA: Insufficient documentation

## 2015-11-27 LAB — CBC WITH DIFFERENTIAL/PLATELET
BASOS ABS: 0 10*3/uL (ref 0.0–0.1)
BASOS PCT: 0 %
EOS PCT: 2 %
Eosinophils Absolute: 0.1 10*3/uL (ref 0.0–0.7)
HCT: 33.5 % — ABNORMAL LOW (ref 36.0–46.0)
Hemoglobin: 11.7 g/dL — ABNORMAL LOW (ref 12.0–15.0)
Lymphocytes Relative: 32 %
Lymphs Abs: 2.1 10*3/uL (ref 0.7–4.0)
MCH: 28.6 pg (ref 26.0–34.0)
MCHC: 34.9 g/dL (ref 30.0–36.0)
MCV: 81.9 fL (ref 78.0–100.0)
MONO ABS: 0.4 10*3/uL (ref 0.1–1.0)
MONOS PCT: 7 %
Neutro Abs: 3.9 10*3/uL (ref 1.7–7.7)
Neutrophils Relative %: 59 %
PLATELETS: 325 10*3/uL (ref 150–400)
RBC: 4.09 MIL/uL (ref 3.87–5.11)
RDW: 12.5 % (ref 11.5–15.5)
WBC: 6.5 10*3/uL (ref 4.0–10.5)

## 2015-11-27 LAB — COMPREHENSIVE METABOLIC PANEL
ALBUMIN: 3.1 g/dL — AB (ref 3.5–5.0)
ALK PHOS: 45 U/L (ref 38–126)
ALT: 12 U/L — ABNORMAL LOW (ref 14–54)
ANION GAP: 9 (ref 5–15)
AST: 13 U/L — ABNORMAL LOW (ref 15–41)
BILIRUBIN TOTAL: 0.5 mg/dL (ref 0.3–1.2)
BUN: 7 mg/dL (ref 6–20)
CALCIUM: 9.2 mg/dL (ref 8.9–10.3)
CO2: 27 mmol/L (ref 22–32)
Chloride: 103 mmol/L (ref 101–111)
Creatinine, Ser: 0.7 mg/dL (ref 0.44–1.00)
GFR calc non Af Amer: >60 mL/min (ref >60)
GLUCOSE: 91 mg/dL (ref 65–99)
POTASSIUM: 3.7 mmol/L (ref 3.5–5.1)
SODIUM: 139 mmol/L (ref 135–145)
TOTAL PROTEIN: 6.7 g/dL (ref 6.5–8.1)

## 2015-11-27 LAB — LIPASE, BLOOD: Lipase: 20 U/L (ref 11–51)

## 2015-11-27 LAB — TROPONIN I: Troponin I: <0.03 ng/mL (ref <0.031)

## 2015-11-27 MED ORDER — KETOROLAC TROMETHAMINE 30 MG/ML IJ SOLN
30.0000 mg | Freq: Once | INTRAMUSCULAR | Status: AC
  Administered 2015-11-27: 30 mg via INTRAVENOUS
  Filled 2015-11-27: qty 1

## 2015-11-27 MED ORDER — GI COCKTAIL ~~LOC~~
30.0000 mL | Freq: Once | ORAL | Status: AC
  Administered 2015-11-27: 30 mL via ORAL
  Filled 2015-11-27: qty 30

## 2015-11-27 MED ORDER — FAMOTIDINE 20 MG PO TABS: 20.0000 mg | ORAL_TABLET | Freq: Every day | ORAL | Status: DC | PRN

## 2015-11-27 NOTE — ED Provider Notes (Signed)
CSN: [id number]     Arrival date & time 11/27/15  1611 History   First MD Initiated Contact with Patient 11/27/15 1621     Chief Complaint  Patient presents with  . Chest Pain     (Consider location/radiation/quality/duration/timing/severity/associated sxs/prior Treatment) HPI Comments: Pt comes in with cc of chest pain. Pt reports that the chest pain started at 1 am. Pain is constant and described as tearing pain. Pain is midsternal and non radiating. She has had this type of pain, but the current pain is constant. She tool protonix and OTC meds with no relief. + nausea but no emesis, dib, numbness, tingling. Denies drug use, smoking, alcohol abuse.   ROS 10 Systems reviewed and are negative for acute change except as noted in the HPI.      The history is provided by the patient.    Past Medical History  Diagnosis Date  . Esophageal stricture   . Chest pain 09/14/2009    no pulmonary embolus by chest CT  . Palpitations   . Elevated BP   . Anxiety     past Hx  . Depression     past Hx  . GERD (gastroesophageal reflux disease)     no meds  . Sickle cell trait ([place])   . Fibroid   . Abnormal Pap smear     cryo, normal since  . Fibroids 09/24/2015  . Hypertension   . Essential hypertension 09/28/2009    Qualifier: Diagnosis of  By: [name] FNP, [place]    . Arrhythmia    Past Surgical History  Procedure Laterality Date  . Cesarean section  2004  . Laparoscopy for ectopic pregnancy  2001  . Wisdom tooth extraction  2012   Family History  Problem Relation Age of Onset  . Asthma Mother   . Hypertension Mother   . Hypertension Paternal Uncle   . Asthma Maternal Grandmother   . Colon cancer Neg Hx   . Esophageal cancer Neg Hx   . Stomach cancer Neg Hx   . Asthma Daughter   . Thyroid disease Mother   . Heart Problems Mother    Social History  Substance Use Topics  . Smoking status: Never Smoker   . Smokeless tobacco: Never Used  . Alcohol Use: No    OB History    Gravida Para Term Preterm AB TAB SAB Ectopic Multiple Living   6 2 2  0 3 0 0 3 0 2     Review of Systems  Constitutional: Positive for activity change.  Respiratory: Negative for shortness of breath.   Cardiovascular: Negative for chest pain.  Gastrointestinal: Negative for nausea, vomiting and abdominal pain.  Genitourinary: Negative for dysuria.  Musculoskeletal: Negative for neck pain.  Neurological: Negative for headaches.      Allergies  Hydromorphone; Morphine; and Diphenhydramine  Home Medications   Prior to Admission medications   Medication Sig Start Date End Date Taking? Authorizing Provider  aspirin 81 MG tablet Take 81 mg by mouth daily.   Yes Historical Provider, MD  clonazePAM (KLONOPIN) 0.5 MG tablet Take 0.25-0.5 tablets by mouth 2 (two) times daily as needed for anxiety. anxiety 10/05/15  Yes Historical Provider, MD  hydrochlorothiazide (HYDRODIURIL) 12.5 MG tablet Take 1 tablet (12.5 mg total) by mouth daily. 10/28/15  Yes Brittainy Erie Noe, PA-C  ondansetron (ZOFRAN ODT) 8 MG disintegrating tablet Take 1 tablet (8 mg total) by mouth every 8 (eight) hours as needed for nausea or vomiting. 11/25/15  Yes  Jola Schmidt, MD  propranolol (INDERAL) 20 MG tablet Take 1 tablet (20 mg total) by mouth 2 (two) times daily. Patient taking differently: Take 10 mg by mouth 2 (two) times daily.  11/09/15  Yes Rhonda G Barrett, PA-C  Vitamin D, Ergocalciferol, (DRISDOL) 50000 UNITS CAPS capsule TAKE 1 CAPSULE BY MOUTH WEEKLY.- TUESDAYS 03/23/10  Yes Historical Provider, MD  famotidine (PEPCID) 20 MG tablet Take 1 tablet (20 mg total) by mouth daily as needed for heartburn or indigestion. 11/27/15   Drayden Lukas, MD   BP 105/65 mmHg  Pulse 75  Resp 14  SpO2 99%  LMP 11/26/2015 Physical Exam  Constitutional: She is oriented to person, place, and time. She appears well-developed.  HENT:  Head: Normocephalic and atraumatic.  Eyes: EOM are normal.  Neck:  Normal range of motion. Neck supple.  Cardiovascular: Normal rate.   No murmur heard. Pulmonary/Chest: Effort normal.  Abdominal: Bowel sounds are normal.  Neurological: She is alert and oriented to person, place, and time.  Skin: Skin is warm and dry.  Nursing note and vitals reviewed.   ED Course  Procedures (including critical care time) Labs Review Labs Reviewed  CBC WITH DIFFERENTIAL/PLATELET - Abnormal; Notable for the following:    Hemoglobin 11.7 (*)    HCT 33.5 (*)    All other components within normal limits  COMPREHENSIVE METABOLIC PANEL - Abnormal; Notable for the following:    Albumin 3.1 (*)    AST 13 (*)    ALT 12 (*)    All other components within normal limits  TROPONIN I  LIPASE, BLOOD    Imaging Review Dg Chest 2 View  11/27/2015  CLINICAL DATA:  Pt complaining of sub sternal chest pain since 1pm today. Describes it as a "tearing" sensation. EMS gave 324 ASA and 2 nitro. Some improvement in pain with nitro. Hx HTN controlled with medication. EXAM: CHEST  2 VIEW COMPARISON:  11/25/2015 FINDINGS: The heart size and mediastinal contours are within normal limits. Both lungs are clear. The visualized skeletal structures are unremarkable. IMPRESSION: No active cardiopulmonary disease. Electronically Signed   By: Nolon Nations M.D.   On: 11/27/2015 17:23   I have personally reviewed and evaluated these images and lab results as part of my medical decision-making.   EKG Interpretation   Date/Time:  Friday November 27 2015 16:21:22 EST Ventricular Rate:  76 PR Interval:  146 QRS Duration: 106 QT Interval:  367 QTC Calculation: 413 R Axis:   34 Text Interpretation:  Sinus rhythm RSR' in V1 or V2, probably normal  variant Baseline wander in lead(s) V6 No acute changes Confirmed by  Kathrynn Humble, MD, Thelma Comp [phone]) on 11/27/2015 4:32:07 PM      MDM   Final diagnoses:  Chest pain, unspecified chest pain type    Pt comes in with cc of chest pain, epigastric  abd pain. She reports severe abd pain that started all of a sudden. Pt is s/p Ct scan of the chest and abdomen in the last 3 months (3 CT scans). She is s/p EGD that is neg and a stress test that is neg.  I am not sure what the cause is for the pain. I dont think a repeat CT is indicated. CXR has been ordered. We will give GI cocktail. Esophageal spasm is in the ddx. She denies heavy THC use - that is another possibility otherwise.  Will get basic labs, including trop.    Varney Biles, MD 11/28/15 [phone]

## 2015-11-27 NOTE — ED Notes (Signed)
Pt remains monitored by blood pressure, pulse ox, and 12 lead.  

## 2015-11-27 NOTE — ED Notes (Signed)
Per EMS: Pt complaining of sub sternal chest pain since 1pm today. Describes it as a "tearing" sensation. EMS gave 324 ASA and 2 nitro. Some improvement in pain with nitro. Pt has hx of SVT and anxiety.

## 2015-11-27 NOTE — ED Notes (Signed)
Pt placed on to monitor upon return to room from radiology. Pt remains monitored by blood pressure, pulse ox, and 5 lead.

## 2015-11-27 NOTE — Discharge Instructions (Signed)
Nonspecific Chest Pain  °Chest pain can be caused by many different conditions. There is always a chance that your pain could be related to something serious, such as a heart attack or a blood clot in your lungs. Chest pain can also be caused by conditions that are not life-threatening. If you have chest pain, it is very important to follow up with your health care provider. °CAUSES  °Chest pain can be caused by: °· Heartburn. °· Pneumonia or bronchitis. °· Anxiety or stress. °· Inflammation around your heart (pericarditis) or lung (pleuritis or pleurisy). °· A blood clot in your lung. °· A collapsed lung (pneumothorax). It can develop suddenly on its own (spontaneous pneumothorax) or from trauma to the chest. °· Shingles infection (varicella-zoster virus). °· Heart attack. °· Damage to the bones, muscles, and cartilage that make up your chest wall. This can include: °¨ Bruised bones due to injury. °¨ Strained muscles or cartilage due to frequent or repeated coughing or overwork. °¨ Fracture to one or more ribs. °¨ Sore cartilage due to inflammation (costochondritis). °RISK FACTORS  °Risk factors for chest pain may include: °· Activities that increase your risk for trauma or injury to your chest. °· Respiratory infections or conditions that cause frequent coughing. °· Medical conditions or overeating that can cause heartburn. °· Heart disease or family history of heart disease. °· Conditions or health behaviors that increase your risk of developing a blood clot. °· Having had chicken pox (varicella zoster). °SIGNS AND SYMPTOMS °Chest pain can feel like: °· Burning or tingling on the surface of your chest or deep in your chest. °· Crushing, pressure, aching, or squeezing pain. °· Dull or sharp pain that is worse when you move, cough, or take a deep breath. °· Pain that is also felt in your back, neck, shoulder, or arm, or pain that spreads to any of these areas. °Your chest pain may come and go, or it may stay  constant. °DIAGNOSIS °Lab tests or other studies may be needed to find the cause of your pain. Your health care provider may have you take a test called an ambulatory ECG (electrocardiogram). An ECG records your heartbeat patterns at the time the test is performed. You may also have other tests, such as: °· Transthoracic echocardiogram (TTE). During echocardiography, sound waves are used to create a picture of all of the heart structures and to look at how blood flows through your heart. °· Transesophageal echocardiogram (TEE). This is a more advanced imaging test that obtains images from inside your body. It allows your health care provider to see your heart in finer detail. °· Cardiac monitoring. This allows your health care provider to monitor your heart rate and rhythm in real time. °· Holter monitor. This is a portable device that records your heartbeat and can help to diagnose abnormal heartbeats. It allows your health care provider to track your heart activity for several days, if needed. °· Stress tests. These can be done through exercise or by taking medicine that makes your heart beat more quickly. °· Blood tests. °· Imaging tests. °TREATMENT  °Your treatment depends on what is causing your chest pain. Treatment may include: °· Medicines. These may include: °¨ Acid blockers for heartburn. °¨ Anti-inflammatory medicine. °¨ Pain medicine for inflammatory conditions. °¨ Antibiotic medicine, if an infection is present. °¨ Medicines to dissolve blood clots. °¨ Medicines to treat coronary artery disease. °· Supportive care for conditions that do not require medicines. This may include: °¨ Resting. °¨ Applying heat   or cold packs to injured areas.  Limiting activities until pain decreases. HOME CARE INSTRUCTIONS  If you were prescribed an antibiotic medicine, finish it all even if you start to feel better.  Avoid any activities that bring on chest pain.  Do not use any tobacco products, including  cigarettes, chewing tobacco, or electronic cigarettes. If you need help quitting, ask your health care provider.  Do not drink alcohol.  Take medicines only as directed by your health care provider.  Keep all follow-up visits as directed by your health care provider. This is important. This includes any further testing if your chest pain does not go away.  If heartburn is the cause for your chest pain, you may be told to keep your head raised (elevated) while sleeping. This reduces the chance that acid will go from your stomach into your esophagus.  Make lifestyle changes as directed by your health care provider. These may include:  Getting regular exercise. Ask your health care provider to suggest some activities that are safe for you.  Eating a heart-healthy diet. A registered dietitian can help you to learn healthy eating options.  Maintaining a healthy weight.  Managing diabetes, if necessary.  Reducing stress. SEEK MEDICAL CARE IF:  Your chest pain does not go away after treatment.  You have a rash with blisters on your chest.  You have a fever. SEEK IMMEDIATE MEDICAL CARE IF:   Your chest pain is worse.  You have an increasing cough, or you cough up blood.  You have severe abdominal pain.  You have severe weakness.  You faint.  You have chills.  You have sudden, unexplained chest discomfort.  You have sudden, unexplained discomfort in your arms, back, neck, or jaw.  You have shortness of breath at any time.  You suddenly start to sweat, or your skin gets clammy.  You feel nauseous or you vomit.  You suddenly feel light-headed or dizzy.  Your heart begins to beat quickly, or it feels like it is skipping beats. These symptoms may represent a serious problem that is an emergency. Do not wait to see if the symptoms will go away. Get medical help right away. Call your local emergency services (911 in the [place] Do not drive yourself to the hospital.   This  information is not intended to replace advice given to you by your health care provider. Make sure you discuss any questions you have with your health care provider.   Document Released: 08/24/2005 Document Revised: 12/05/2014 Document Reviewed: 06/20/2014 Elsevier Interactive Patient Education 2016 Else[employer] Heartburn Heartburn is a type of pain or discomfort that can happen in the throat or chest. It is often described as a burning pain. It may also cause a bad taste in the mouth. Heartburn may feel worse when you lie down or bend over, and it is often worse at night. Heartburn may be caused by stomach contents that move back up into the esophagus (reflux). HOME CARE INSTRUCTIONS Take these actions to decrease your discomfort and to help avoid complications. Diet  Follow a diet as recommended by your health care provider. This may involve avoiding foods and drinks such as:  Coffee and tea (with or without caffeine).  Drinks that contain alcohol.  Energy drinks and sports drinks.  Carbonated drinks or sodas.  Chocolate and cocoa.  Peppermint and mint flavorings.  Garlic and onions.  Horseradish.  Spicy and acidic foods, including peppers, chili powder, curry powder, vinegar, hot sauces, and barbecue sauce.  Citrus fruit juices and citrus fruits, such as oranges, lemons, and limes.  Tomato-based foods, such as red sauce, chili, salsa, and pizza with red sauce.  Fried and fatty foods, such as donuts, french fries, potato chips, and high-fat dressings.  High-fat meats, such as hot dogs and fatty cuts of red and white meats, such as rib eye steak, sausage, ham, and bacon.  High-fat dairy items, such as whole milk, butter, and cream cheese.  Eat small, frequent meals instead of large meals.  Avoid drinking large amounts of liquid with your meals.  Avoid eating meals during the 2-3 hours before bedtime.  Avoid lying down right after you eat.  Do not exercise right  after you eat. General Instructions  Pay attention to any changes in your symptoms.  Take over-the-counter and prescription medicines only as told by your health care provider. Do not take aspirin, ibuprofen, or other NSAIDs unless your health care provider told you to do so.  Do not use any tobacco products, including cigarettes, chewing tobacco, and e-cigarettes. If you need help quitting, ask your health care provider.  Wear loose-fitting clothing. Do not wear anything tight around your waist that causes pressure on your abdomen.  Raise (elevate) the head of your bed about 6 inches (15 cm).  Try to reduce your stress, such as with yoga or meditation. If you need help reducing stress, ask your health care provider.  If you are overweight, reduce your weight to an amount that is healthy for you. Ask your health care provider for guidance about a safe weight loss goal.  Keep all follow-up visits as told by your health care provider. This is important. SEEK MEDICAL CARE IF:  You have new symptoms.  You have unexplained weight loss.  You have difficulty swallowing, or it hurts to swallow.  You have wheezing or a persistent cough.  Your symptoms do not improve with treatment.  You have frequent heartburn for more than two weeks. SEEK IMMEDIATE MEDICAL CARE IF:  You have pain in your arms, neck, jaw, teeth, or back.  You feel sweaty, dizzy, or light-headed.  You have chest pain or shortness of breath.  You vomit and your vomit looks like blood or coffee grounds.  Your stool is bloody or black.   This information is not intended to replace advice given to you by your health care provider. Make sure you discuss any questions you have with your health care provider.   Document Released: 04/02/2009 Document Revised: 08/05/2015 Document Reviewed: 03/11/2015 Elsevier Interactive Patient Education [employer].

## 2015-11-27 NOTE — Telephone Encounter (Signed)
I agree. I would not increase propranolol at this time.

## 2015-12-01 ENCOUNTER — Ambulatory Visit (INDEPENDENT_AMBULATORY_CARE_PROVIDER_SITE_OTHER): Payer: BLUE CROSS/BLUE SHIELD | Admitting: Neurology

## 2015-12-01 ENCOUNTER — Encounter: Payer: Self-pay | Admitting: Neurology

## 2015-12-01 VITALS — BP 116/80 | HR 70 | Resp 16 | Ht 66.0 in | Wt 194.8 lb

## 2015-12-01 DIAGNOSIS — M2550 Pain in unspecified joint: Secondary | ICD-10-CM

## 2015-12-01 DIAGNOSIS — H539 Unspecified visual disturbance: Secondary | ICD-10-CM | POA: Diagnosis not present

## 2015-12-01 DIAGNOSIS — R200 Anesthesia of skin: Secondary | ICD-10-CM

## 2015-12-01 DIAGNOSIS — F418 Other specified anxiety disorders: Secondary | ICD-10-CM

## 2015-12-01 DIAGNOSIS — R0789 Other chest pain: Secondary | ICD-10-CM | POA: Diagnosis not present

## 2015-12-01 MED ORDER — BUSPIRONE HCL 10 MG PO TABS: 10.0000 mg | ORAL_TABLET | Freq: Three times a day (TID) | ORAL | Status: DC

## 2015-12-01 NOTE — Progress Notes (Signed)
[facility]  PATIENT: Stacy [name] DOB: 03-21-1971  REFERRING DOCTOR OR PCP:  Debbrah Alar SOURCE: patient and records in EMR  _________________________________   HISTORICAL  CHIEF COMPLAINT:  Chief Complaint  Patient presents with  . Pain    Sts. chronic pain and fatigue has been improved.  Sts. she still has some blurry vision in her right eye.  Visual acuity today is 20/40 right eye, 20/20 left eye, without corrective lenses.  Sts. 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  Also sts. opthalmology did VEP's in Dec. 2016.  She was unable to tolerate Lexapro due to increased heartrate.  She sees a cardiologist for this.  Now sts. she has new sx. of feeling like she has running water down her scalp--onset one week ago,/fim  . Fatigue    HISTORY OF PRESENT ILLNESS:  Stacy [name] is a 45 yo woman with joint pain, headaches, numbness and visual changes.   She reports that she just hasn't been feeling well the past few months.     History of symptoms:  This summer, she began to experience pain in her shoulders and other joints.   She also had pain in the left leg. The right shoulder also has a fair amount of pain.   Although she feels the shoulder is weaker, she has not noted change in ROM.   Her ESR was elevated (42) but ANA and CRP are normal.  She has seen cardiology.     On 09/03/15, she woke up with visual blurring in the right eye.   She has felt more tired and weak the past month, as well.    She has seen cardiology and has presented to emergency room several times for atypical chest pain.. She had a negative exercise tolerance test 10/15/2015. Chest x-rays the last couple months show no cardiopulmonary disease.  Data:     When re-tested 09/22/15, ESR was back to normal but CRP was elevated.    MRI of the brain 10/21/2015 was essentially normal    she had a sleep study 09/20/2015 that did not show any significant OSA and no PLMS.. There  was absence of stage III sleep and spontaneous arousals during the study.  Pain:   She reports her joint pain and headaches are better.   Vit D was very low and has been supplemented and she feels better.   Rheumatology has ruled out autoimmune disease.    Headaches:   She still is having headaches.   She feels like fluid running down her head over the past week.   She notes a stiff neck.     Vision:  The vision changes are better the past month, now gets occasional blurry vision.    She never had VEP done. As normal MRI of the brain makes MS unlikely, I'll hold off on this test.  Fatigue:  She has felt fatigued the past 6 months.  Fatigue is generally worse later in the day.  She notes that her sleep can be variable with insomnia at times.  Gait/strength/sensation:  She denies any difficulty with gait.    She feels strength is fairly symmetric at this time.     She reports episodes of intermittent numbness in her legs.   This occurs most frequently when sitting.   The numbness usually lasts 5-10 minutes  B/B:  She has not noted mild urinary frequency since starting a diuretic.   Bowel is fine  Mood:   She feels  more stressed over the last 6 months.   She stopped Lexapro.   She is going to counseling Press photographer).     She takes clonazepam bid.            REVIEW OF SYSTEMS: Constitutional: No fevers, chills, sweats, or change in appetite.  She notes fatigue. Eyes: She has noted some visual changes in the right eye.   No double vision or eye pain Ear, nose and throat: No hearing loss, ear pain, nasal congestion, sore throat Cardiovascular: No chest pain, palpitations Respiratory: No shortness of breath at rest or with exertion.   No wheezes GastrointestinaI: No nausea, vomiting, diarrhea, abdominal pain, fecal incontinence Genitourinary: No dysuria, urinary retention or frequency.  No nocturia. Musculoskeletal: as above Integumentary: No rash, pruritus, skin lesions Neurological: as  above Psychiatric: Notes anxiety > depression.   Sees counseling Endocrine: No palpitations, diaphoresis, change in appetite, change in weigh or increased thirst Hematologic/Lymphatic: No anemia, purpura, petechiae. Allergic/Immunologic: No itchy/runny eyes, nasal congestion, recent allergic reactions, rashes  ALLERGIES: Allergies  Allergen Reactions  . Hydromorphone Shortness Of Breath and Nausea And Vomiting  . Morphine Nausea And Vomiting  . Diphenhydramine Hives    HOME MEDICATIONS:  Current outpatient prescriptions:  .  clonazePAM (KLONOPIN) 0.5 MG tablet, Take 0.25-0.5 tablets by mouth 2 (two) times daily as needed for anxiety. anxiety, Disp: , Rfl: 0 .  famotidine (PEPCID) 20 MG tablet, Take 1 tablet (20 mg total) by mouth daily as needed for heartburn or indigestion., Disp: 30 tablet, Rfl: 0 .  hydrochlorothiazide (HYDRODIURIL) 12.5 MG tablet, Take 1 tablet (12.5 mg total) by mouth daily., Disp: 30 tablet, Rfl: 6 .  propranolol (INDERAL) 20 MG tablet, Take 1 tablet (20 mg total) by mouth 2 (two) times daily. (Patient taking differently: Take 10 mg by mouth 2 (two) times daily. ), Disp: 60 tablet, Rfl: 6 .  Vitamin D, Ergocalciferol, (DRISDOL) 50000 UNITS CAPS capsule, TAKE 1 CAPSULE BY MOUTH WEEKLY.- TUESDAYS, Disp: , Rfl:  .  aspirin 81 MG tablet, Take 81 mg by mouth daily. Reported on 12/01/2015, Disp: , Rfl:  .  ondansetron (ZOFRAN ODT) 8 MG disintegrating tablet, Take 1 tablet (8 mg total) by mouth every 8 (eight) hours as needed for nausea or vomiting. (Patient not taking: Reported on 12/01/2015), Disp: 10 tablet, Rfl: 0 No current facility-administered medications for this visit.  Facility-Administered Medications Ordered in Other Visits:  .  iohexol (OMNIPAQUE) 300 MG/ML solution 100 mL, 100 mL, Intravenous, Once PRN, Jola Schmidt, MD  PAST MEDICAL HISTORY: Past Medical History  Diagnosis Date  . Esophageal stricture   . Chest pain 09/14/2009    no pulmonary embolus by  chest CT  . Palpitations   . Elevated BP   . Anxiety     past Hx  . Depression     past Hx  . GERD (gastroesophageal reflux disease)     no meds  . Sickle cell trait ([place])   . Fibroid   . Abnormal Pap smear     cryo, normal since  . Fibroids 09/24/2015  . Hypertension   . Essential hypertension 09/28/2009    Qualifier: Diagnosis of  By: [name] FNP, [place]    . Arrhythmia     PAST SURGICAL HISTORY: Past Surgical History  Procedure Laterality Date  . Cesarean section  2004  . Laparoscopy for ectopic pregnancy  2001  . Wisdom tooth extraction  2012    FAMILY HISTORY: Family History  Problem Relation Age of  Onset  . Asthma Mother   . Hypertension Mother   . Hypertension Paternal Uncle   . Asthma Maternal Grandmother   . Colon cancer Neg Hx   . Esophageal cancer Neg Hx   . Stomach cancer Neg Hx   . Asthma Daughter   . Thyroid disease Mother   . Heart Problems Mother     SOCIAL HISTORY:  Social History   Social History  . Marital Status: Legally Separated    Spouse Name: N/A  . Number of Children: 2  . Years of Education: N/A   Occupational History  .  [employer]   Social History Main Topics  . Smoking status: Never Smoker   . Smokeless tobacco: Never Used  . Alcohol Use: No  . Drug Use: No  . Sexual Activity:    Partners: Male    Birth Control/ Protection: Condom   Other Topics Concern  . Not on file   Social History Narrative   Epworth Sleepiness Scale = 5 (as of 09/16/2015)     PHYSICAL EXAM  Filed Vitals:   12/01/15 0916  BP: 116/80  Pulse: 70  Resp: 16  Height: 5' 6"  (1.676 m)  Weight: 194 lb 12.8 oz (88.361 kg)    Body mass index is 31.46 kg/(m^2).   General: The patient is well-developed and well-nourished and in no acute distress  Skin: Extremities are without significant edema.  Musculoskeletal:  She is mildly tender over the subacromial bursae, AC joints bilaterally in the shoulders.   Good passive and active ROM.    She is also tender in some classic Fibromyalgia tender points.  Back is mildly tender.     Neurologic Exam  Mental status: The patient is alert and oriented x 3 at the time of the examination. The patient has apparent normal recent and remote memory, with an apparently normal attention span and concentration ability.   Speech is normal.  Cranial nerves: Extraocular movements are full.   Facial symmetry is present. There is good facial sensation to soft touch bilaterally.Facial strength is normal.  Trapezius and sternocleidomastoid strength is normal. No dysarthria is noted.  The tongue is midline, and the patient has symmetric elevation of the soft palate. No obvious hearing deficits are noted.  Motor:  Muscle bulk is normal.   Tone is normal. Strength is  5 / 5 in all 4 extremities.   Sensory: Sensory testing is intact to touch and vibration sensation in all 4 extremities.  Coordination: Cerebellar testing reveals good finger-nose-finger and heel-to-shin bilaterally.  Gait and station: Station is normal.   Gait is normal. Tandem gait is normal. Romberg is negative.   Reflexes: Deep tendon reflexes are symmetric and normal bilaterally.       DIAGNOSTIC DATA (LABS, IMAGING, TESTING) - I reviewed patient records, labs, notes, testing and imaging myself where available.  Lab Results  Component Value Date   WBC 6.5 11/27/2015   HGB 11.7* 11/27/2015   HCT 33.5* 11/27/2015   MCV 81.9 11/27/2015   PLT 325 11/27/2015      Component Value Date/Time   NA 139 11/27/2015 1952   K 3.7 11/27/2015 1952   CL 103 11/27/2015 1952   CO2 27 11/27/2015 1952   GLUCOSE 91 11/27/2015 1952   BUN 7 11/27/2015 1952   CREATININE 0.70 11/27/2015 1952   CREATININE 0.75 11/04/2013 1500   CALCIUM 9.2 11/27/2015 1952   PROT 6.7 11/27/2015 1952   ALBUMIN 3.1* 11/27/2015 1952   AST 13* 11/27/2015  1952   ALT 12* 11/27/2015 1952   ALKPHOS 45 11/27/2015 1952   BILITOT 0.5 11/27/2015 1952   GFRNONAA >60  11/27/2015 1952   GFRAA >60 11/27/2015 1952   Lab Results  Component Value Date   CHOL 178 09/28/2009   HDL 53 09/28/2009   LDLCALC 113* 09/28/2009   TRIG 62 09/28/2009   CHOLHDL 3.4 Ratio 09/28/2009   Lab Results  Component Value Date   HGBA1C 5.3 05/24/2010   No results found for: VITAMINB12 Lab Results  Component Value Date   TSH 1.010 09/22/2015       ASSESSMENT AND PLAN  Pain, joint, multiple sites  Visual disturbance  Numbness  Depression with anxiety  Atypical chest pain    1.    Many of her symptoms are likely hypervigilance due to a mood disturbance. Continue counseling.   Add buspirone for anxiety.   She did not tolerate an SSRI.  Consider Cymbalta if pain (mild Fibromyalgia?) continues 2.   Stay active and exercise as tolerated 3.    rtc 6 months, sooner if problems  Remiel Corti A. Felecia Shelling, MD, PhD 0/12/6376, 5:88 AM Certified in Neurology, Clinical Neurophysiology, Sleep Medicine, Pain Medicine and Neuroimaging  [facility] [address] [place], [place] [address] [phone]

## 2015-12-02 ENCOUNTER — Telehealth: Payer: Self-pay | Admitting: *Deleted

## 2015-12-02 NOTE — Telephone Encounter (Signed)
Request faxed to [place] [facility] on 12/02/15.

## 2015-12-03 ENCOUNTER — Ambulatory Visit: Payer: BLUE CROSS/BLUE SHIELD | Admitting: Psychology

## 2015-12-03 ENCOUNTER — Encounter: Payer: Self-pay | Admitting: Cardiovascular Disease

## 2015-12-03 ENCOUNTER — Ambulatory Visit: Payer: Self-pay | Admitting: Obstetrics & Gynecology

## 2015-12-03 ENCOUNTER — Ambulatory Visit (INDEPENDENT_AMBULATORY_CARE_PROVIDER_SITE_OTHER): Payer: BLUE CROSS/BLUE SHIELD | Admitting: Cardiovascular Disease

## 2015-12-03 VITALS — BP 114/78 | HR 90 | Ht 66.0 in | Wt 191.3 lb

## 2015-12-03 DIAGNOSIS — F419 Anxiety disorder, unspecified: Secondary | ICD-10-CM | POA: Diagnosis not present

## 2015-12-03 DIAGNOSIS — I10 Essential (primary) hypertension: Secondary | ICD-10-CM

## 2015-12-03 DIAGNOSIS — K219 Gastro-esophageal reflux disease without esophagitis: Secondary | ICD-10-CM

## 2015-12-03 NOTE — Patient Instructions (Signed)
Dr Oval Linsey has made no changes today in your current medications or treatment plan.  Your physician recommends that you schedule a follow-up appointment in 3 months.  If you need a refill on your cardiac medications before your next appointment, please call your pharmacy.

## 2015-12-03 NOTE — Progress Notes (Signed)
Cardiology Office Note   Date:  12/06/2015   ID:  Stacy [name], DOB Aug 29, 1971, MRN [id number]  PCP:  Stacy Mccreedy, MD  Cardiologist:   Stacy Harness, MD   Chief Complaint  Patient presents with  . Appointment    Denies SOB, CP, dizziness and swelling      Patient ID: Stacy [name] is a 45 y.o. female with hypertension, obesity, anxiety and depression who presents for an evaluation of chest pain and shortness of breath.    Interval History 12/03/15: Since her last appointment, Stacy [name] been seen multiple times in the emergency department with chest pain.  Each time her cardiac enzymes are unremarkable.She had a CT scan of the abdomen and pelvis on 12/28that revealed uterine fibroids but was otherwise unremarkable.  She had another episode of chest pain while siting in the movies.  It was intense substernal chest discomfort with shortness of breath.  She checked her blood pressure with a wrist monitor and her systolic BP was 060 mmHg.  She was also tachycardic to the 160s.  EMS was called and her BP was in the 170s.  By the time she arrived to the ED her vitals had normalized without intervention.  Stacy [name] followed up with a new PCP at the [facility] who started her on klonazepam.  Her Neurologist recommended Buspar but she has yet to start this medication.  She has follow up with a gastroenterologist to determine if GERD or Celiac disease could be causing her symptoms.  She has eliminated salt and pork from her diet with the hope that this will help her blood pressure.  At one of her ED visits she was reportedly instructed to increase propranolol to 20 mg bid.  However, her systolic BP was 80 after making this change.  Therefore, she reduced propranolol back to 10 mg bid.     Interval history 11/12/15: Since her last appointment, Stacy [name] had a stress test that was negative for ischemia.  She was hypertensive during her ETT, so  she was started on HCTZ 12.5 mg daily.  She continues to have poorly-controlled blood pressure despite taking HCTZ and reducing her salt intake.  She also underwent a 7 day event monitor, during which she reported symptoms but no arrhythmias were noted.  Stacy [name] has been seen in the ED 6 times with palpitations and near syncope.  Her home monitor has recorded blood pressures as high as 180/110 with a heart rate in the 160s. In the ED her vitals have been unremarkable.  She reports episodes of labile blood pressure. She was seen by her OB/GYN and initially had a normal blood pressure. However while sitting there she started to feel unwell.  She was noted to beta tachycardic with a heart rate of 126 and her blood pressure increased to 159/91.  He was referred to the emergency department were no abnormalities were noted other than a heart rate of 109.  She switched from metoprolol to propranolol and continues to feel fatigued.  Her symptoms are always worse at night.Sometimes she awakens with palpitations and her heart rate in the 120s. She continues to have pain in her chest and radiating down into her left arm. She has no energy or appetite  TSH is normal.  At one of her ED visits she was started on metoprolol and followed up with Stacy [name] on 11/30.  At that appointment her ECG showed sinus tachycardia at 108 bpm.  She was seen  by Stacy Rinne, PA-C on 12/7, at which time she noted fatigue on metoprolol.  She was advised to continue metoprolol for at least a week before deciding to switch to propranol as recommended by her PCP.  This am she has GERD and feels generally unwell.  Stacy [name] reports that she was unable to sleep during the sleep study.  The study was negative for obstructive sleep apnea.  Stacy [name], Stacy [name], was recently accepted to [employer]. She also was a reported nearly a full right scholarship.   History of Present Illness 09/16/15: She was  evaluated in the ED on 9/9 for chest pain and shortness of breath.  Cardiac enzymes and EKG were unremarkable.  LE ultrasound was negative for DVT one week prior to her ED visit.  Stacy [name] reports that she has been feeling poorly for a while now.  For the last year she has noted intermittent palpitations and shortness of breath. The episodes occur 3-4 times per week and on days when they occur they happen several times throughout the day. They're associated with lightheadedness and dizziness and last for a few seconds at a time. She denies chest pain, nausea or diaphoresis when this occurs. The symptoms typically occur at rest and not with exertion.  Stacy [name] reports 3-4 months of significant fatigue. She is very tired halfway through her day at work and also with carrying groceries or doing light housework. She does not get any exercise. She denies chest pain with exertion. She also notes pain in bilateral shoulders radiates down into her arms. There is also associated numbness to her elbows. She endorses occasional lower extremity edema the right leg. She did undergo ultrasound testing that was negative for DVT.   She reports seeing a cardiologist over one year ago.  She had a treadmill stress test that were reportedly normal.  She was told that she probably had normal skipped beats but did not have any ambulatory monitoring.   2 months of difficulty staying asleep.  Her [name] notes that she stops breathing overnight and this [name] reports sometimes waking up gasping for air.  She has acid reflux and sometimes can't differentiate that from her heart.   Of note, her mother has heart disease and recently had a heart cath.  She does not know was found at the time of cardiac catheterization.  Past Medical History  Diagnosis Date  . Esophageal stricture   . Chest pain 09/14/2009    no pulmonary embolus by chest CT  . Palpitations   . Elevated BP   . Anxiety     past Hx    . Depression     past Hx  . GERD (gastroesophageal reflux disease)     no meds  . Sickle cell trait ([place])   . Fibroid   . Abnormal Pap smear     cryo, normal since  . Fibroids 09/24/2015  . Hypertension   . Essential hypertension 09/28/2009    Qualifier: Diagnosis of  By: [name] FNP, Wellington Hampshire    . Arrhythmia     Past Surgical History  Procedure Laterality Date  . Cesarean section  2004  . Laparoscopy for ectopic pregnancy  2001  . Wisdom tooth extraction  2012     Current Outpatient Prescriptions  Medication Sig Dispense Refill  . busPIRone (BUSPAR) 10 MG tablet Take 1 tablet (10 mg total) by mouth 3 (three) times daily. 60 tablet 11  . famotidine (PEPCID) 20 MG tablet Take 1  tablet (20 mg total) by mouth daily as needed for heartburn or indigestion. 30 tablet 0  . hydrochlorothiazide (HYDRODIURIL) 12.5 MG tablet Take 1 tablet (12.5 mg total) by mouth daily. 30 tablet 6  . propranolol (INDERAL) 20 MG tablet Take 10 mg by mouth 2 (two) times daily.    . Vitamin D, Ergocalciferol, (DRISDOL) 50000 UNITS CAPS capsule TAKE 1 CAPSULE BY MOUTH WEEKLY.- TUESDAYS    . clonazePAM (KLONOPIN) 0.5 MG tablet Take 0.25-0.5 tablets by mouth 2 (two) times daily as needed for anxiety. Reported on 12/03/2015  0   No current facility-administered medications for this visit.   Facility-Administered Medications Ordered in Other Visits  Medication Dose Route Frequency Provider Last Rate Last Dose  . iohexol (OMNIPAQUE) 300 MG/ML solution 100 mL  100 mL Intravenous Once PRN Jola Schmidt, MD        Allergies:   Hydromorphone; Morphine; and Diphenhydramine    Social History:  The patient  reports that she has never smoked. She has never used smokeless tobacco. She reports that she does not drink alcohol or use illicit drugs.   Family History:  The patient's family history includes Asthma in her [name], maternal grandmother, and mother; Heart Problems in her mother; Hypertension in her mother  and paternal uncle; Thyroid disease in her mother. There is no history of Colon cancer, Esophageal cancer, or Stomach cancer.    ROS:  Please see the history of present illness.   Otherwise, review of systems are positive for none.   All other systems are reviewed and negative.    PHYSICAL EXAM: VS:  BP 114/78 mmHg  Pulse 90  Ht 5' 6"  (1.676 m)  Wt 86.773 kg (191 lb 4.8 oz)  BMI 30.89 kg/m2  LMP 11/26/2015 , BMI Body mass index is 30.89 kg/(m^2). GENERAL:  Well appearing HEENT:  Pupils equal round and reactive, fundi not visualized, oral mucosa unremarkable NECK:  No jugular venous distention, waveform within normal limits, carotid upstroke brisk and symmetric, no bruits, no thyromegaly LYMPHATICS:  No cervical adenopathy LUNGS:  Clear to auscultation bilaterally HEART:  RRR.  PMI not displaced or sustained,S1 and S2 within normal limits, no S3, no S4, no clicks, no rubs, no murmurs ABD:  Flat, positive bowel sounds normal in frequency in pitch, no bruits, no rebound, no guarding, no midline pulsatile mass, no hepatomegaly, no splenomegaly EXT:  2 plus pulses throughout, no edema, no cyanosis no clubbing SKIN:  No rashes no nodules NEURO:  Cranial nerves II through XII grossly intact, motor grossly intact throughout PSYCH:  Cognitively intact, oriented to person place and time    EKG:  EKG is not ordered today. The ekg ordered 08/10/15 demonstrates sinus rhythm rate 75 bpm.  Incomplete RBBB.    ETT 10/15/15:   Blood pressure demonstrated a hypertensive response to exercise.  Upsloping ST segment depression ST segment depression of 2 mm was noted during stress in the II, III, aVF, V6, V4 and V5 leads.  The patient experienced no angina during the stress test.  Overall, the patient's exercise capacity was normal.  [facility] Treadmill Score:low risk  Negative stress test without evidence of ischemia at given workload. Upsloping ST depression noted - may be due to LVH. Significant  hypertension noted with rest BP 186/102 prior to exercise.  Echo 11/09/15: LVEF 76%.  Mild thickening of the mitral valve.  Trace MR and TR.  RVSP 28 mmHg.  7 Day Event Monitor 09/16/15:  Quality: Fair. Baseline artifact. Predominant rhythm: sinus  rhythm Pauses >2.5 seconds: 0 No PVCs or PACs were noted.  Patient did submit a symptom diary. She reported heart racing, skipped beats, and chest pain. At those times the underlying rhythm was sinus rhythm with rates from 70-93 bpm and one episode of sinus tachycardia, rate 113 bpm.  Recent Labs: 08/07/2015: B Natriuretic Peptide 27.2 09/22/2015: TSH 1.010 11/27/2015: ALT 12*; BUN 7; Creatinine, Ser 0.70; Hemoglobin 11.7*; Platelets 325; Potassium 3.7; Sodium 139    Lipid Panel    Component Value Date/Time   CHOL 178 09/28/2009 2307   TRIG 62 09/28/2009 2307   HDL 53 09/28/2009 2307   CHOLHDL 3.4 Ratio 09/28/2009 2307   VLDL 12 09/28/2009 2307   LDLCALC 113* 09/28/2009 2307      Wt Readings from Last 3 Encounters:  12/03/15 86.773 kg (191 lb 4.8 oz)  12/01/15 88.361 kg (194 lb 12.8 oz)  11/25/15 87.091 kg (192 lb)      ASSESSMENT AND PLAN:  # Hypertension: Ms. Cwik reports transient episodes of poorly controlled blood pressure. Overall, her blood pressure control has been better lately.  Given her extensive testing, it seems that it is unlikely that she has any primary cardiac disease.  Echo, stress test and Holter monitor have all been unremarkable.  I suspect that something else is causing her chest pain (GERD, esophageal spasm or anxiety/panic).  This pain makes her blood pressure and heart rate spike.  By the time she reaches the ED, her vital signs stabilize.  I agree with the thorough GI work up and a trial of SSRI.  Given that she has felt better on benzodiazepines, I suspect there may be a large anxiety component.  However, she feels drowsy on clonopin, and this is not a good long-term plan, so she was appropriately  started on Buspar.  We discussed the fact that it will take several months to know if it is helping and she should start taking it.  She expressed understanding and agreement.  She will also collect the 24 hour urine sample previously ordered to rule out pheochromocytoma.  If her blood pressure spikes to >180. She will take an extra dose of HCTZ 12.5 mg.  # Chest/arm pain: Symptoms are atypical and stress testing was negative.Her symptoms are not consistent with ischemia.  # Palpitations: 7 day event monitor did not reveal any abnormalities.   # Fatigue/shortness of breath: The only abnormality thus far has been a mildly elevated ESR.  She recently followed up with a rheumatologist who felt that her symptoms were not consistent with autoimmune disease.  I do not have a cardiac explanation for her symptoms. Her echo is completely unremarkable. She has no sign of heart failure on exam.   30 Minutes of direct patient care were spent with Ms. Proby today.  The patient does not have concerns regarding medicines.  The following changes have been made:  no change  Labs/ tests ordered today include:   No orders of the defined types were placed in this encounter.     Disposition:   FU with Hosteen Kienast C. Oval Linsey, MD in 3 months.   Signed, Stacy Harness, MD  12/06/2015 3:27 PM    [place]

## 2015-12-04 ENCOUNTER — Ambulatory Visit: Payer: BLUE CROSS/BLUE SHIELD | Admitting: Psychology

## 2015-12-06 ENCOUNTER — Encounter: Payer: Self-pay | Admitting: Cardiovascular Disease

## 2015-12-06 MED ORDER — HYDROCHLOROTHIAZIDE 12.5 MG PO TABS: 12.5000 mg | ORAL_TABLET | Freq: Every day | ORAL | Status: DC

## 2015-12-08 ENCOUNTER — Emergency Department: Payer: BLUE CROSS/BLUE SHIELD

## 2015-12-08 ENCOUNTER — Emergency Department
Admission: EM | Admit: 2015-12-08 | Discharge: 2015-12-08 | Disposition: A | Discharge status: Home / Self Care | Payer: BLUE CROSS/BLUE SHIELD | Attending: Emergency Medicine | Admitting: Emergency Medicine

## 2015-12-08 ENCOUNTER — Encounter: Payer: Self-pay | Admitting: *Deleted

## 2015-12-08 ENCOUNTER — Ambulatory Visit: Payer: Self-pay | Admitting: Cardiovascular Disease

## 2015-12-08 ENCOUNTER — Other Ambulatory Visit: Payer: Self-pay

## 2015-12-08 DIAGNOSIS — R0789 Other chest pain: Secondary | ICD-10-CM | POA: Diagnosis not present

## 2015-12-08 DIAGNOSIS — I10 Essential (primary) hypertension: Secondary | ICD-10-CM | POA: Diagnosis not present

## 2015-12-08 DIAGNOSIS — Z79899 Other long term (current) drug therapy: Secondary | ICD-10-CM | POA: Diagnosis not present

## 2015-12-08 DIAGNOSIS — R079 Chest pain, unspecified: Secondary | ICD-10-CM | POA: Diagnosis present

## 2015-12-08 LAB — CBC
HCT: 37.3 % (ref 35.0–47.0)
HEMOGLOBIN: 12.6 g/dL (ref 12.0–16.0)
MCH: 27.9 pg (ref 26.0–34.0)
MCHC: 33.8 g/dL (ref 32.0–36.0)
MCV: 82.5 fL (ref 80.0–100.0)
PLATELETS: 401 10*3/uL (ref 150–440)
RBC: 4.52 MIL/uL (ref 3.80–5.20)
RDW: 13 % (ref 11.5–14.5)
WBC: 5.7 10*3/uL (ref 3.6–11.0)

## 2015-12-08 LAB — BASIC METABOLIC PANEL
ANION GAP: 10 (ref 5–15)
BUN: 9 mg/dL (ref 6–20)
CALCIUM: 9.4 mg/dL (ref 8.9–10.3)
CHLORIDE: 99 mmol/L — AB (ref 101–111)
CO2: 28 mmol/L (ref 22–32)
Creatinine, Ser: 0.74 mg/dL (ref 0.44–1.00)
GFR calc non Af Amer: >60 mL/min (ref >60)
GLUCOSE: 87 mg/dL (ref 65–99)
POTASSIUM: 3.5 mmol/L (ref 3.5–5.1)
Sodium: 137 mmol/L (ref 135–145)

## 2015-12-08 LAB — TROPONIN I: Troponin I: <0.03 ng/mL (ref <0.031)

## 2015-12-08 NOTE — Discharge Instructions (Signed)
Return to the emergency room if you have any new or worrisome symptoms, or you change your mind about further evaluation. Follow closely with your primary care doctor and her cardiologist.

## 2015-12-08 NOTE — ED Provider Notes (Addendum)
[facility] Emergency Department Provider Note  ____________________________________________   I have reviewed the triage vital signs and the nursing notes.   HISTORY  Chief Complaint I feel better   HPI Stacy [name] is a 45 y.o. female who presents today stating that she feels much better. She states the last 3 months she has had episodes where her heart rate will go up "out of the blue, or unprovoked, her blood pressure will spike and she'll get chest discomfort. She denies any shortness of breath. She states that she has had extensive workup for this including a ultrasound of her thyroid and TSH, Holter monitor, ultrasound of her heart, she has been started on propanolol and given anti-anxiety medications. Patient has had chest x-rays and EKGs. All of it has been reassuring. She states no [place] for why she has these episodes. She denies any history of ever self or family having PEs or DVTs. No recent travel no leg swelling and no shortness of breath. The patient states that this time she feels "calm her" and better. She states this afternoon she had a normal flare of her usual tachycardia. She states that she was with her friend in a "calm environment" when she started having tachycardia and chest discomfort. The chest discomfort is in the midsternal region. It's the same pain she's had over the last several months. It is not exertional. It began at rest. Did not radiate. No abdominal pain. No nausea no vomiting no diaphoresis. No shortness of breath. No pleuritic pain. No leg swelling no calf swelling no recent travel. No pleuritic chest pain. At this time she states that this is a "safe environment" and her symptoms of gone away. She has childcare issues and would like to be discharged. She denies abuse at home, she denies SI or HI, she states that she would prefer not to stay for further evaluation.  Past Medical History  Diagnosis Date  . Esophageal  stricture   . Chest pain 09/14/2009    no pulmonary embolus by chest CT  . Palpitations   . Elevated BP   . Anxiety     past Hx  . Depression     past Hx  . GERD (gastroesophageal reflux disease)     no meds  . Sickle cell trait ([place])   . Fibroid   . Abnormal Pap smear     cryo, normal since  . Fibroids 09/24/2015  . Hypertension   . Essential hypertension 09/28/2009    Qualifier: Diagnosis of  By: [name] FNP, Wellington Hampshire    . Arrhythmia     Patient Active Problem List   Diagnosis Date Noted  . Major depressive disorder ([place]) 10/26/2015  . 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 Fibroids 09/24/2015  . Pain, joint, multiple sites 09/22/2015  . Visual disturbance 09/22/2015  . Numbness 09/22/2015  . Dyspnea 08/10/2015  . Obesity (BMI 30-39.9) 04/17/2015  . Environmental allergies 04/17/2015  . Thyromegaly 10/01/2014  . Edema 11/07/2013  . Atypical chest pain 04/30/2013  . Other fatigue 05/24/2010  . VITAMIN D DEFICIENCY 09/30/2009  . PALPITATIONS, RECURRENT 09/30/2009  . CHEST PAIN, ATYPICAL, HX OF 09/30/2009  . Anxiety state 09/28/2009  . Depression with anxiety 09/28/2009  . GERD 09/28/2009  . Essential hypertension 09/28/2009    Past Surgical History  Procedure Laterality Date  . Cesarean section  2004  . Laparoscopy for ectopic pregnancy  2001  . Wisdom tooth extraction  2012    Current Outpatient Rx  Name  Route  Sig  Dispense  Refill  . busPIRone (BUSPAR) 10 MG tablet   Oral   Take 1 tablet (10 mg total) by mouth 3 (three) times daily.   60 tablet   11   . clonazePAM (KLONOPIN) 0.5 MG tablet   Oral   Take 0.25-0.5 tablets by mouth 2 (two) times daily as needed for anxiety. Reported on 12/03/2015      0   . famotidine (PEPCID) 20 MG tablet   Oral   Take 1 tablet (20 mg total) by mouth daily as needed for heartburn or indigestion.   30 tablet   0   . hydrochlorothiazide (HYDRODIURIL) 12.5 MG tablet   Oral   Take 1 tablet (12.5 mg total) by  mouth daily.   90 tablet   3   . propranolol (INDERAL) 20 MG tablet   Oral   Take 10 mg by mouth 2 (two) times daily.         . Vitamin D, Ergocalciferol, (DRISDOL) 50000 UNITS CAPS capsule      TAKE 1 CAPSULE BY MOUTH WEEKLY.- TUESDAYS           Allergies Hydromorphone; Morphine; and Diphenhydramine  Family History  Problem Relation Age of Onset  . Asthma Mother   . Hypertension Mother   . Hypertension Paternal Uncle   . Asthma Maternal Grandmother   . Colon cancer Neg Hx   . Esophageal cancer Neg Hx   . Stomach cancer Neg Hx   . Asthma Daughter   . Thyroid disease Mother   . Heart Problems Mother     Social History Social History  Substance Use Topics  . Smoking status: Never Smoker   . Smokeless tobacco: Never Used  . Alcohol Use: No    Review of Systems Constitutional: No fever/chills Eyes: No visual changes. ENT: No sore throat. No stiff neck no neck pain Cardiovascular: See history of present illness Respiratory: Denies shortness of breath. Gastrointestinal:   no vomiting.  No diarrhea.  No constipation. Genitourinary: Negative for dysuria. Musculoskeletal: Negative lower extremity swelling Skin: Negative for rash. Neurological: Negative for headaches, focal weakness or numbness. 10-point ROS otherwise negative.  ____________________________________________   PHYSICAL EXAM:  VITAL SIGNS: ED Triage Vitals  Enc Vitals Group     BP 12/08/15 1230 135/95 mmHg     Pulse Rate 12/08/15 1230 73     Resp 12/08/15 1230 18     Temp --      Temp src --      SpO2 12/08/15 1230 100 %     Weight 12/08/15 1230 190 lb (86.183 kg)     Height 12/08/15 1230 5\' 6"  (1.676 m)     Head Cir --      Peak Flow --      Pain Score 12/08/15 1231 7     Pain Loc --      Pain Edu? --      Excl. in [place]? --     Constitutional: Alert and oriented. Well appearing and in no acute distress. Well appearing nontoxic Eyes: Conjunctivae are normal. PERRL. EOMI. Head:  Atraumatic. Nose: No congestion/rhinnorhea. Mouth/Throat: Mucous membranes are moist.  Oropharynx non-erythematous. Neck: No stridor.   Nontender with no meningismus Cardiovascular: Normal rate, regular rhythm. Grossly normal heart sounds.  Good peripheral circulation. Chest: Tender to palpation the chest wall in the sternal region, this is productive of her symptoms. There are no shingles noted. Chaperone present. Respiratory: Normal respiratory effort.  No retractions. Lungs CTAB. Abdominal: Soft and nontender. No distention. No guarding no rebound Back:  There is no focal tenderness or step off there is no midline tenderness there are no lesions noted. there is no CVA tenderness Musculoskeletal: No lower extremity tenderness. No joint effusions, no DVT signs strong distal pulses no edema Neurologic:  Normal speech and language. No gross focal neurologic deficits are appreciated.  Skin:  Skin is warm, dry and intact. No rash noted. Psychiatric: Mood and affect are mildly anxious. Speech and behavior are normal.  ____________________________________________   LABS (all labs ordered are listed, but only abnormal results are displayed)  Labs Reviewed  BASIC METABOLIC PANEL - Abnormal; Notable for the following:    Chloride 99 (*)    All other components within normal limits  TROPONIN I  CBC   ____________________________________________  EKG  I personally interpreted any EKGs ordered by me or triage Normal sinus rhythm rate 76 bpm partial right bundle-branch block noted no acute ST elevation or acute ST depression normal axis, unchanged from prior ____________________________________________  RADIOLOGY  I reviewed any imaging ordered by me or triage that were performed during my shift ____________________________________________   PROCEDURES  Procedure(s) performed: None  Critical Care performed: None  ____________________________________________   INITIAL IMPRESSION /  ASSESSMENT AND PLAN / ED COURSE  Pertinent labs & imaging results that were available during my care of the patient were reviewed by me and considered in my medical decision making (see chart for details).  Patient has an appointment with her cardiologist tomorrow she is revealing further workup. I have offered her serial cardiac enzymes, further imaging, and we discussed admission. Patient declines all of these interventions. I do not think this is unreasonable. Patient has had a comprehensive outpatient workup for this. Patient states she would much prefer to go home. She has no evidence of abuse. She understands the risks of going home without serial cardiac enzymes. She is eager to leave. She is requesting her discharge paperwork. We will discharge her therefore at her own request with close follow-up with her cardiologist. She has an appointment she states tomorrow. Return precautions are given and she understands she can come back at any time she changes her mind or has new or worrisome symptoms. There is nothing at this time suggestive of PE dissection myocarditis endocarditis pericarditis or referred abdominal pain. ____________________________________________   FINAL CLINICAL IMPRESSION(S) / ED DIAGNOSES  Final diagnoses:  None    Schuyler Amor, MD 12/08/15 Br[place], MD 12/08/15 [id number]  Schuyler Amor, MD 12/08/15 1616

## 2015-12-08 NOTE — ED Notes (Signed)
Pt reports chest pain is a 5/10 and voiced concerned that she needs to be home and would like blood results and XR results so she can get back to her child.

## 2015-12-08 NOTE — ED Notes (Addendum)
Pt states sudden onset of chest pain, nausea, and weakness, states she sees a cardioligist for hx of chest pain and tachycardia, pt awake and alert  Upon arrival, states this feels like previous episodes she has had

## 2015-12-16 ENCOUNTER — Ambulatory Visit: Payer: BLUE CROSS/BLUE SHIELD | Admitting: Psychology

## 2015-12-16 ENCOUNTER — Encounter (HOSPITAL_COMMUNITY): Payer: Self-pay | Admitting: Emergency Medicine

## 2015-12-16 ENCOUNTER — Emergency Department (HOSPITAL_COMMUNITY): Payer: BLUE CROSS/BLUE SHIELD

## 2015-12-16 ENCOUNTER — Emergency Department (HOSPITAL_COMMUNITY)
Admission: EM | Admit: 2015-12-16 | Discharge: 2015-12-16 | Disposition: A | Discharge status: Home / Self Care | Payer: BLUE CROSS/BLUE SHIELD | Attending: Emergency Medicine | Admitting: Emergency Medicine

## 2015-12-16 DIAGNOSIS — R0789 Other chest pain: Secondary | ICD-10-CM | POA: Diagnosis not present

## 2015-12-16 DIAGNOSIS — Z862 Personal history of diseases of the blood and blood-forming organs and certain disorders involving the immune mechanism: Secondary | ICD-10-CM | POA: Diagnosis not present

## 2015-12-16 DIAGNOSIS — R110 Nausea: Secondary | ICD-10-CM | POA: Diagnosis not present

## 2015-12-16 DIAGNOSIS — F329 Major depressive disorder, single episode, unspecified: Secondary | ICD-10-CM | POA: Diagnosis not present

## 2015-12-16 DIAGNOSIS — I10 Essential (primary) hypertension: Secondary | ICD-10-CM | POA: Insufficient documentation

## 2015-12-16 DIAGNOSIS — R1013 Epigastric pain: Secondary | ICD-10-CM | POA: Diagnosis not present

## 2015-12-16 DIAGNOSIS — F419 Anxiety disorder, unspecified: Secondary | ICD-10-CM | POA: Insufficient documentation

## 2015-12-16 DIAGNOSIS — Z79899 Other long term (current) drug therapy: Secondary | ICD-10-CM | POA: Insufficient documentation

## 2015-12-16 DIAGNOSIS — K219 Gastro-esophageal reflux disease without esophagitis: Secondary | ICD-10-CM | POA: Insufficient documentation

## 2015-12-16 DIAGNOSIS — R079 Chest pain, unspecified: Secondary | ICD-10-CM | POA: Diagnosis present

## 2015-12-16 DIAGNOSIS — Z3202 Encounter for pregnancy test, result negative: Secondary | ICD-10-CM | POA: Insufficient documentation

## 2015-12-16 DIAGNOSIS — Z86018 Personal history of other benign neoplasm: Secondary | ICD-10-CM | POA: Insufficient documentation

## 2015-12-16 LAB — COMPREHENSIVE METABOLIC PANEL
ALBUMIN: 3.8 g/dL (ref 3.5–5.0)
ALK PHOS: 48 U/L (ref 38–126)
ALT: 15 U/L (ref 14–54)
ANION GAP: 10 (ref 5–15)
AST: 17 U/L (ref 15–41)
BILIRUBIN TOTAL: 0.5 mg/dL (ref 0.3–1.2)
BUN: 12 mg/dL (ref 6–20)
CALCIUM: 9.3 mg/dL (ref 8.9–10.3)
CO2: 27 mmol/L (ref 22–32)
Chloride: 103 mmol/L (ref 101–111)
Creatinine, Ser: 0.79 mg/dL (ref 0.44–1.00)
GFR calc Af Amer: >60 mL/min (ref >60)
GFR calc non Af Amer: >60 mL/min (ref >60)
GLUCOSE: 93 mg/dL (ref 65–99)
POTASSIUM: 3.5 mmol/L (ref 3.5–5.1)
Sodium: 140 mmol/L (ref 135–145)
Total Protein: 8 g/dL (ref 6.5–8.1)

## 2015-12-16 LAB — LIPASE, BLOOD: LIPASE: 23 U/L (ref 11–51)

## 2015-12-16 LAB — I-STAT BETA HCG BLOOD, ED (MC, WL, AP ONLY): I-stat hCG, quantitative: <5 m[IU]/mL (ref <5)

## 2015-12-16 LAB — TROPONIN I: Troponin I: <0.03 ng/mL (ref <0.031)

## 2015-12-16 LAB — CBC
HEMATOCRIT: 35.6 % — AB (ref 36.0–46.0)
HEMOGLOBIN: 12.4 g/dL (ref 12.0–15.0)
MCH: 28.5 pg (ref 26.0–34.0)
MCHC: 34.8 g/dL (ref 30.0–36.0)
MCV: 81.8 fL (ref 78.0–100.0)
Platelets: 370 10*3/uL (ref 150–400)
RBC: 4.35 MIL/uL (ref 3.87–5.11)
RDW: 12.7 % (ref 11.5–15.5)
WBC: 5 10*3/uL (ref 4.0–10.5)

## 2015-12-16 LAB — TSH: TSH: 1.193 u[IU]/mL (ref 0.350–4.500)

## 2015-12-16 LAB — MAGNESIUM: MAGNESIUM: 1.9 mg/dL (ref 1.7–2.4)

## 2015-12-16 MED ORDER — FENTANYL CITRATE (PF) 100 MCG/2ML IJ SOLN
50.0000 ug | Freq: Once | INTRAMUSCULAR | Status: AC
Start: 2015-12-16 — End: 2015-12-16
  Administered 2015-12-16: 50 ug via INTRAMUSCULAR
  Filled 2015-12-16: qty 2

## 2015-12-16 MED ORDER — ASPIRIN 81 MG PO CHEW
324.0000 mg | CHEWABLE_TABLET | Freq: Once | ORAL | Status: AC
  Administered 2015-12-16: 324 mg via ORAL
  Filled 2015-12-16: qty 4

## 2015-12-16 MED ORDER — GI COCKTAIL ~~LOC~~
30.0000 mL | Freq: Once | ORAL | Status: AC
  Administered 2015-12-16: 30 mL via ORAL
  Filled 2015-12-16: qty 30

## 2015-12-16 MED ORDER — FENTANYL CITRATE (PF) 100 MCG/2ML IJ SOLN
50.0000 ug | Freq: Once | INTRAMUSCULAR | Status: AC
  Administered 2015-12-16: 50 ug via INTRAMUSCULAR
  Filled 2015-12-16: qty 2

## 2015-12-16 NOTE — ED Notes (Signed)
Per pt, states burning sensation in chest which started this am-currently under cardio care-being worked up for same symptoms

## 2015-12-16 NOTE — Discharge Instructions (Signed)
As discussed, your evaluation today has been largely reassuring.  But, it is important that you monitor your condition carefully, and do not hesitate to return to the ED if you develop new, or concerning changes in your condition.  Otherwise, please follow-up with your physician for appropriate ongoing care.  Recall that there are several laboratory studies which are still pending, including thyroid tests. Please be sure to discuss these with your physician.

## 2015-12-16 NOTE — ED Provider Notes (Signed)
CSN: [id number]     Arrival date & time 12/16/15  [id number] History   First MD Initiated Contact with Patient 12/16/15 0719     Chief Complaint  Patient presents with  . Chest Pain    HPI  Patient with multiple medical issues presents with concern of epigastric and right upper chest pain. Patient was in her usual state of health until this morning, several hours ago, when upon awakening she noticed pain in the 2 areas. Patient has a notable history of recurrent episodes of similar pain, though she states today's pain is different, more severe. Since onset, pain has been persistent, with no clear alleviating or exacerbating factors. Notably, the patient started taking new medication, Carafate, yesterday. Patient is also currently being evaluated for pheochromocytoma. Currently she denies fever, vomiting, though she endorses anorexia. She also acknowledges weight loss, 17 pounds recently.   Past Medical History  Diagnosis Date  . Esophageal stricture   . Chest pain 09/14/2009    no pulmonary embolus by chest CT  . Palpitations   . Elevated BP   . Anxiety     past Hx  . Depression     past Hx  . GERD (gastroesophageal reflux disease)     no meds  . Sickle cell trait ([place])   . Fibroid   . Abnormal Pap smear     cryo, normal since  . Fibroids 09/24/2015  . Hypertension   . Essential hypertension 09/28/2009    Qualifier: Diagnosis of  By: [name] FNP, [place]    . Arrhythmia    Past Surgical History  Procedure Laterality Date  . Cesarean section  2004  . Laparoscopy for ectopic pregnancy  2001  . Wisdom tooth extraction  2012   Family History  Problem Relation Age of Onset  . Asthma Mother   . Hypertension Mother   . Hypertension Paternal Uncle   . Asthma Maternal Grandmother   . Colon cancer Neg Hx   . Esophageal cancer Neg Hx   . Stomach cancer Neg Hx   . Asthma Daughter   . Thyroid disease Mother   . Heart Problems Mother    Social History  Substance Use  Topics  . Smoking status: Never Smoker   . Smokeless tobacco: Never Used  . Alcohol Use: No   OB History    Gravida Para Term Preterm AB TAB SAB Ectopic Multiple Living   6 2 2  0 3 0 0 3 0 2     Review of Systems  Constitutional:       Per HPI, otherwise negative  HENT:       Per HPI, otherwise negative  Respiratory:       Per HPI, otherwise negative  Cardiovascular:       Per HPI, otherwise negative  Gastrointestinal: Positive for nausea. Negative for vomiting.  Endocrine:       Negative aside from HPI  Genitourinary:       Neg aside from HPI   Musculoskeletal:       Per HPI, otherwise negative  Skin: Negative.   Neurological: Negative for syncope.      Allergies  Hydromorphone; Morphine; and Diphenhydramine  Home Medications   Prior to Admission medications   Medication Sig Start Date End Date Taking? Authorizing Provider  clonazePAM (KLONOPIN) 0.5 MG tablet Take 0.25-0.5 tablets by mouth 2 (two) times daily as needed for anxiety. Reported on 12/03/2015 10/05/15  Yes Historical Provider, MD  Cyanocobalamin (VITAMIN B 12 PO) Take  1 tablet by mouth daily.   Yes Historical Provider, MD  famotidine (PEPCID) 20 MG tablet Take 1 tablet (20 mg total) by mouth daily as needed for heartburn or indigestion. 11/27/15  Yes Varney Biles, MD  hydrochlorothiazide (HYDRODIURIL) 12.5 MG tablet Take 1 tablet (12.5 mg total) by mouth daily. 12/06/15  Yes Skeet Latch, MD  propranolol (INDERAL) 20 MG tablet Take 10 mg by mouth 2 (two) times daily.   Yes Historical Provider, MD  Vitamin D, Ergocalciferol, (DRISDOL) 50000 UNITS CAPS capsule TAKE 1 CAPSULE BY MOUTH WEEKLY.- TUESDAYS 03/23/10  Yes Historical Provider, MD  busPIRone (BUSPAR) 10 MG tablet Take 1 tablet (10 mg total) by mouth 3 (three) times daily. 12/01/15   Britt Bottom, MD   BP 111/76 mmHg  Pulse 80  Temp(Src) 98.4 F (36.9 C)  Resp 16  SpO2 100%  LMP 12/08/2015 Physical Exam  Constitutional: She is oriented to  person, place, and time. She appears well-developed and well-nourished. No distress.  HENT:  Head: Normocephalic and atraumatic.  Eyes: Conjunctivae and EOM are normal.  Cardiovascular: Normal rate and regular rhythm.   Pulmonary/Chest: Effort normal and breath sounds normal. No stridor. No respiratory distress.  Abdominal: She exhibits no distension.  Mild epigastric tenderness to palpation, no guarding, rebound  Musculoskeletal: She exhibits no edema.  Neurological: She is alert and oriented to person, place, and time. No cranial nerve deficit. She exhibits normal muscle tone. Coordination normal.  Skin: Skin is warm and dry.  Psychiatric: She has a normal mood and affect.  Nursing note and vitals reviewed.   ED Course  Procedures (including critical care time) Labs Review Labs Reviewed  CBC - Abnormal; Notable for the following:    HCT 35.6 (*)    All other components within normal limits  COMPREHENSIVE METABOLIC PANEL  MAGNESIUM  TROPONIN I  LIPASE, BLOOD  TSH  T4, FREE  I-STAT BETA HCG BLOOD, ED ([facility], [facility], [facility] ONLY)    Imaging Review Dg Chest 2 View  12/16/2015  CLINICAL DATA:  Left-sided chest pain EXAM: CHEST  2 VIEW COMPARISON:  December 08, 2015 FINDINGS: Lungs are clear. Heart size and pulmonary vascularity are normal. No adenopathy. No bone lesions. No pneumothorax. IMPRESSION: No edema or consolidation. Electronically Signed   By: Lowella Grip III M.D.   On: 12/16/2015 08:30   I have personally reviewed and evaluated these images and lab results as part of my medical decision-making.   EKG Interpretation   Date/Time:  Wednesday December 16 2015 07:26:19 EST Ventricular Rate:  83 PR Interval:  150 QRS Duration: 110 QT Interval:  355 QTC Calculation: 417 R Axis:   22 Text Interpretation:  Sinus rhythm RSR' in V1 or V2, right VCD or RVH  Sinus rhythm RSR prime No significant change since last tracing Abnormal  ekg Confirmed by Carmin Muskrat  MD ([id number]) on  12/16/2015 8:06:49 AM     12:11 PM Patient in no distress. We discussed all findings at length. Patient will follow up with GI and primary care.  MDM  Patient  presents with recurrent chest pain. Here, the patient is awake, alert, in no distress. Patient's evaluation was largely reassuring, with no evidence for PE, ACS, pneumonia, hemodynamically instability. Patient has history of esophageal stricture, which may be contributory. Patient also had evaluation for endocrine causes, which is pending With improved condition, reassuring vitals, labs, the patient was discharged in stable condition to continue evaluation as an outpatient.    Carmin Muskrat, MD  12/16/15 1212 

## 2015-12-17 ENCOUNTER — Telehealth: Payer: Self-pay | Admitting: Internal Medicine

## 2015-12-17 NOTE — Telephone Encounter (Signed)
Pt states she has been to the ER twice recently for abdominal pain, reflux, and states she has lost 20 pounds. States the ER suggested she follow up with GI. Pt scheduled to see Dr. Henrene Pastor 12/31/15@10 :45am. Pt aware of appt.

## 2015-12-17 NOTE — Telephone Encounter (Signed)
Left message for pt to call back  °

## 2015-12-25 ENCOUNTER — Ambulatory Visit (INDEPENDENT_AMBULATORY_CARE_PROVIDER_SITE_OTHER): Payer: BLUE CROSS/BLUE SHIELD | Admitting: Psychology

## 2015-12-25 DIAGNOSIS — F329 Major depressive disorder, single episode, unspecified: Secondary | ICD-10-CM

## 2015-12-25 DIAGNOSIS — F411 Generalized anxiety disorder: Secondary | ICD-10-CM

## 2015-12-28 ENCOUNTER — Inpatient Hospital Stay (HOSPITAL_COMMUNITY): Payer: BLUE CROSS/BLUE SHIELD

## 2015-12-28 ENCOUNTER — Inpatient Hospital Stay (HOSPITAL_COMMUNITY)
Admission: EM | Admit: 2015-12-28 | Discharge: 2015-12-28 | Disposition: A | Discharge status: Home / Self Care | Payer: BLUE CROSS/BLUE SHIELD | Attending: Family Medicine | Admitting: Family Medicine

## 2015-12-28 ENCOUNTER — Ambulatory Visit: Payer: Self-pay | Admitting: Obstetrics & Gynecology

## 2015-12-28 ENCOUNTER — Encounter (HOSPITAL_COMMUNITY): Payer: Self-pay | Admitting: Emergency Medicine

## 2015-12-28 ENCOUNTER — Emergency Department (HOSPITAL_COMMUNITY): Payer: BLUE CROSS/BLUE SHIELD

## 2015-12-28 DIAGNOSIS — I10 Essential (primary) hypertension: Secondary | ICD-10-CM | POA: Diagnosis not present

## 2015-12-28 DIAGNOSIS — K222 Esophageal obstruction: Secondary | ICD-10-CM | POA: Insufficient documentation

## 2015-12-28 DIAGNOSIS — K219 Gastro-esophageal reflux disease without esophagitis: Secondary | ICD-10-CM | POA: Insufficient documentation

## 2015-12-28 DIAGNOSIS — O3680X0 Pregnancy with inconclusive fetal viability, not applicable or unspecified: Secondary | ICD-10-CM

## 2015-12-28 DIAGNOSIS — O200 Threatened abortion: Secondary | ICD-10-CM | POA: Diagnosis not present

## 2015-12-28 DIAGNOSIS — T50905A Adverse effect of unspecified drugs, medicaments and biological substances, initial encounter: Secondary | ICD-10-CM

## 2015-12-28 DIAGNOSIS — Z3201 Encounter for pregnancy test, result positive: Secondary | ICD-10-CM

## 2015-12-28 DIAGNOSIS — O26899 Other specified pregnancy related conditions, unspecified trimester: Secondary | ICD-10-CM

## 2015-12-28 DIAGNOSIS — R109 Unspecified abdominal pain: Secondary | ICD-10-CM | POA: Insufficient documentation

## 2015-12-28 LAB — CBC WITH DIFFERENTIAL/PLATELET
BASOS ABS: 0 10*3/uL (ref 0.0–0.1)
BASOS PCT: 0 %
Eosinophils Absolute: 0.1 10*3/uL (ref 0.0–0.7)
Eosinophils Relative: 2 %
HEMATOCRIT: 34.6 % — AB (ref 36.0–46.0)
HEMOGLOBIN: 12.2 g/dL (ref 12.0–15.0)
LYMPHS PCT: 30 %
Lymphs Abs: 2.3 10*3/uL (ref 0.7–4.0)
MCH: 29.3 pg (ref 26.0–34.0)
MCHC: 35.3 g/dL (ref 30.0–36.0)
MCV: 83 fL (ref 78.0–100.0)
MONO ABS: 0.6 10*3/uL (ref 0.1–1.0)
Monocytes Relative: 8 %
NEUTROS ABS: 4.5 10*3/uL (ref 1.7–7.7)
NEUTROS PCT: 60 %
Platelets: 393 10*3/uL (ref 150–400)
RBC: 4.17 MIL/uL (ref 3.87–5.11)
RDW: 12.9 % (ref 11.5–15.5)
WBC: 7.5 10*3/uL (ref 4.0–10.5)

## 2015-12-28 LAB — URINALYSIS, ROUTINE W REFLEX MICROSCOPIC
Bilirubin Urine: NEGATIVE
Glucose, UA: NEGATIVE mg/dL
Ketones, ur: NEGATIVE mg/dL
LEUKOCYTES UA: NEGATIVE
NITRITE: NEGATIVE
PROTEIN: NEGATIVE mg/dL
SPECIFIC GRAVITY, URINE: 1.008 (ref 1.005–1.030)
pH: 5.5 (ref 5.0–8.0)

## 2015-12-28 LAB — HCG, QUANTITATIVE, PREGNANCY: hCG, Beta Chain, Quant, S: 148 m[IU]/mL — ABNORMAL HIGH (ref <5)

## 2015-12-28 LAB — POC URINE PREG, ED: PREG TEST UR: POSITIVE — AB

## 2015-12-28 LAB — I-STAT CHEM 8, ED
BUN: 6 mg/dL (ref 6–20)
CHLORIDE: 100 mmol/L — AB (ref 101–111)
CREATININE: 0.7 mg/dL (ref 0.44–1.00)
Calcium, Ion: 1.12 mmol/L (ref 1.12–1.23)
GLUCOSE: 97 mg/dL (ref 65–99)
HEMATOCRIT: 38 % (ref 36.0–46.0)
HEMOGLOBIN: 12.9 g/dL (ref 12.0–15.0)
POTASSIUM: 3.8 mmol/L (ref 3.5–5.1)
Sodium: 138 mmol/L (ref 135–145)
TCO2: 26 mmol/L (ref 0–100)

## 2015-12-28 LAB — URINE MICROSCOPIC-ADD ON

## 2015-12-28 LAB — ABO/RH: ABO/RH(D): A POS

## 2015-12-28 LAB — I-STAT BETA HCG BLOOD, ED (MC, WL, AP ONLY): HCG, QUANTITATIVE: 191.6 m[IU]/mL — AB (ref <5)

## 2015-12-28 MED ORDER — KETOROLAC TROMETHAMINE 60 MG/2ML IM SOLN
60.0000 mg | Freq: Once | INTRAMUSCULAR | Status: AC
Start: 2015-12-28 — End: 2015-12-28
  Administered 2015-12-28: 60 mg via INTRAMUSCULAR
  Filled 2015-12-28: qty 2

## 2015-12-28 MED ORDER — DICYCLOMINE HCL 10 MG PO CAPS
10.0000 mg | ORAL_CAPSULE | Freq: Once | ORAL | Status: AC
Start: 2015-12-28 — End: 2015-12-28
  Administered 2015-12-28: 10 mg via ORAL
  Filled 2015-12-28: qty 1

## 2015-12-28 MED ORDER — GI COCKTAIL ~~LOC~~
30.0000 mL | Freq: Once | ORAL | Status: AC
  Administered 2015-12-28: 30 mL via ORAL
  Filled 2015-12-28: qty 30

## 2015-12-28 NOTE — ED Provider Notes (Addendum)
CSN: [id number]     Arrival date & time 12/28/15  [id number] History  By signing my name below, I, Stacy [name], attest that this documentation has been prepared under the direction and in the presence of Kashawn Dirr, MD. Electronically Signed: Helane [name], ED Scribe. 12/28/2015. 3:52 AM.    Chief Complaint  Patient presents with  . Abdominal Pain   Patient is a 45 y.o. female presenting with abdominal pain. The history is provided by the patient. No language interpreter was used.  Abdominal Pain Pain location: lower abdomen. Pain quality: cramping   Pain radiates to:  Does not radiate Pain severity:  Moderate Onset quality:  Gradual Timing:  Constant Progression:  Unchanged Chronicity:  New Context: not alcohol use, not retching and not trauma   Context comment:  Started misoprostol for a biopsy this am at [facility] by Dr. Ihor Dow Relieved by:  Nothing Worsened by:  Nothing tried Ineffective treatments:  None tried Associated symptoms: flatus and vaginal bleeding   Associated symptoms: no anorexia, no belching, no chest pain, no constipation, no cough, no diarrhea, no dysuria, no fever, no hematuria, no melena, no nausea, no shortness of breath and no vomiting   Vaginal bleeding:    Severity:  Mild   Onset quality:  Gradual   Chronicity:  New Risk factors: no alcohol abuse    HPI Comments: Stacy [name] is a 45 y.o. female with a PMHx of chest pain, GERD, esophageal stricture, fibroids, HTN, and anxiety who presents to the Emergency Department complaining of constant, cramping, mid to lower abdominal pain onset tonight. Pt states she is scheduled for a cervical biopsy later today and was prescribed misoprostol to take beforehand. She notes her last BM was yesterday. Pt denies constipation and n/v/d.  Reports that since arriving to the ED she has developed her period and this is the second period this month  Past Medical History  Diagnosis Date  . Esophageal  stricture   . Chest pain 09/14/2009    no pulmonary embolus by chest CT  . Palpitations   . Elevated BP   . Anxiety     past Hx  . Depression     past Hx  . GERD (gastroesophageal reflux disease)     no meds  . Sickle cell trait ([place])   . Fibroid   . Abnormal Pap smear     cryo, normal since  . Fibroids 09/24/2015  . Hypertension   . Essential hypertension 09/28/2009    Qualifier: Diagnosis of  By: [name] FNP, [place]    . Arrhythmia    Past Surgical History  Procedure Laterality Date  . Cesarean section  2004  . Laparoscopy for ectopic pregnancy  2001  . Wisdom tooth extraction  2012   Family History  Problem Relation Age of Onset  . Asthma Mother   . Hypertension Mother   . Hypertension Paternal Uncle   . Asthma Maternal Grandmother   . Colon cancer Neg Hx   . Esophageal cancer Neg Hx   . Stomach cancer Neg Hx   . Asthma Daughter   . Thyroid disease Mother   . Heart Problems Mother    Social History  Substance Use Topics  . Smoking status: Never Smoker   . Smokeless tobacco: Never Used  . Alcohol Use: No   OB History    Gravida Para Term Preterm AB TAB SAB Ectopic Multiple Living   6 2 2  0 3 0 0 3 0 2  Review of Systems  Constitutional: Negative for fever and appetite change.  Respiratory: Negative for cough and shortness of breath.   Cardiovascular: Negative for chest pain, palpitations and leg swelling.  Gastrointestinal: Positive for abdominal pain and flatus. Negative for nausea, vomiting, diarrhea, constipation, melena and anorexia.  Genitourinary: Positive for vaginal bleeding. Negative for dysuria and hematuria.  All other systems reviewed and are negative.   Allergies  Hydromorphone; Morphine; and Diphenhydramine  Home Medications   Prior to Admission medications   Medication Sig Start Date End Date Taking? Authorizing Provider  clonazePAM (KLONOPIN) 0.5 MG tablet Take 0.25-0.5 tablets by mouth 2 (two) times daily as needed for  anxiety. Reported on 12/03/2015 10/05/15  Yes Historical Provider, MD  famotidine (PEPCID) 20 MG tablet Take 1 tablet (20 mg total) by mouth daily as needed for heartburn or indigestion. 11/27/15  Yes Varney Biles, MD  hydrochlorothiazide (HYDRODIURIL) 12.5 MG tablet Take 1 tablet (12.5 mg total) by mouth daily. 12/06/15  Yes Skeet Latch, MD  misoprostol (CYTOTEC) 200 MCG tablet Take 600 mcg by mouth once. PRIOR TO A PROCEDURE.   Yes Historical Provider, MD  propranolol (INDERAL) 20 MG tablet Take 10 mg by mouth 2 (two) times daily.   Yes Historical Provider, MD  Vitamin D, Ergocalciferol, (DRISDOL) 50000 UNITS CAPS capsule TAKE 1 CAPSULE BY MOUTH WEEKLY.- TUESDAYS 03/23/10  Yes Historical Provider, MD  busPIRone (BUSPAR) 10 MG tablet Take 1 tablet (10 mg total) by mouth 3 (three) times daily. Patient not taking: Reported on 12/28/2015 12/01/15   Britt Bottom, MD   BP 120/90 mmHg  Pulse 90  Temp(Src) 98.8 F (37.1 C) (Oral)  Resp 14  Ht 5\' 6"  (1.676 m)  Wt 187 lb (84.823 kg)  BMI 30.20 kg/m2  SpO2 98%  LMP 12/08/2015 Physical Exam  Constitutional: She is oriented to person, place, and time. She appears well-developed and well-nourished. No distress.  HENT:  Head: Normocephalic and atraumatic.  Mouth/Throat: Oropharynx is clear and moist. No oropharyngeal exudate.  Eyes: Conjunctivae and EOM are normal. Pupils are equal, round, and reactive to light. Right eye exhibits no discharge. Left eye exhibits no discharge.  Neck: Normal range of motion. Neck supple.  Cardiovascular: Normal rate, regular rhythm and normal heart sounds.   Pulmonary/Chest: Effort normal and breath sounds normal. No respiratory distress. She has no wheezes. She has no rales.  Abdominal: Soft. Bowel sounds are normal. She exhibits no shifting dullness, no distension, no pulsatile liver, no fluid wave, no abdominal bruit, no ascites and no mass. There is no tenderness. There is no rigidity, no rebound, no guarding, no  tenderness at McBurney's point and negative Murphy's sign.   gassy  Musculoskeletal: Normal range of motion. She exhibits no edema or tenderness.  Neurological: She is alert and oriented to person, place, and time. Coordination normal.  Skin: Skin is warm and dry. No rash noted. She is not diaphoretic. No erythema.  Psychiatric: Her mood appears anxious.  Nursing note and vitals reviewed.   ED Course  Procedures  DIAGNOSTIC STUDIES: Oxygen Saturation is 100% on RA, normal by my interpretation.    COORDINATION OF CARE: 3:40 AM - Discussed plans to order a GI cocktail. Discussed sx being common side-effect of the medication she was prescribed. Pt advised of plan for treatment and pt agrees.  Labs Review Labs Reviewed - No data to display  Imaging Review No results found. I have personally reviewed and evaluated these images and lab results as part of my  medical decision-making.   EKG Interpretation None      MDM   Known side effects of Misoprostol are cramping, gas, vaginal bleeding all of which patient is exhibiting.   Exam and vitals are benign and reassuring.    Patient originally started to both nurse and EDP (with scribe present) this medication before a pelvic biopsy scheduled for today.  Patient gave urine specimen and test is positive for pregnancy.  Patient now stating she is due for IUD placement today and biopsy.  Pregnancy test negative on 12/16/2015.    Full lab work sent and ultrasound ordered to exclude ectopic.    652 am: Case d/w Dr. Gala Romney of OB GYN will accept patient in transfer to the MAU for ultrasound at [facility] and further evaluation  Patient agrees to transfer to the MAU at [facility] for further evaluation of positive pregnancy test with cramping post misoprostol.  Patient will go POV to the MAU  EMTALA completed      Of note:  Patient has multiple alias Caution giving any narcotics.    Veatrice Kells, MD 12/28/15 [phone]

## 2015-12-28 NOTE — Discharge Instructions (Signed)

## 2015-12-28 NOTE — ED Notes (Signed)
Patient here with complaints of mid upper abd pain after taking Cytotec. Reports that she is scheduled for a pelvic biopsy in the am. Denies n/v/d.

## 2015-12-28 NOTE — MAU Provider Note (Signed)
History     CSN: [id number]  Arrival date and time: 12/28/15 [id number]   None     Chief Complaint  Patient presents with  . Abdominal Pain   HPI   Stacy [name] is a 45 y.o. female [phone] with a history of ectopic X 3 and DUB presents today at [redacted]w[redacted]d with abdominal pain and bleeding. She was scheduled to have an endometrial biopsy and mirena IUD placed today. She was given Misoprostal prior to the procedure and presented to the ED this morning with cramping. The patient had a positive pregnancy test there this morning, Pregnancy test was negative on 1/18. She was sent over here to MAU for an [place]; quant today was 148.  The patient was transferred from another ED.   She is having very light vaginal bleeding currently, currently she is having mild abdominal pain; the pain is in the upper part of her stomach.   OB History    Gravida Para Term Preterm AB TAB SAB Ectopic Multiple Living   7 2 2  0 3 0 0 3 0 2      Past Medical History  Diagnosis Date  . Esophageal stricture   . Chest pain 09/14/2009    no pulmonary embolus by chest CT  . Palpitations   . Elevated BP   . Anxiety     past Hx  . Depression     past Hx  . GERD (gastroesophageal reflux disease)     no meds  . Sickle cell trait ([place])   . Fibroid   . Abnormal Pap smear     cryo, normal since  . Fibroids 09/24/2015  . Hypertension   . Essential hypertension 09/28/2009    Qualifier: Diagnosis of  By: [name] FNP, [place]    . Arrhythmia     Past Surgical History  Procedure Laterality Date  . Cesarean section  2004  . Laparoscopy for ectopic pregnancy  2001  . Wisdom tooth extraction  2012    Family History  Problem Relation Age of Onset  . Asthma Mother   . Hypertension Mother   . Hypertension Paternal Uncle   . Asthma Maternal Grandmother   . Colon cancer Neg Hx   . Esophageal cancer Neg Hx   . Stomach cancer Neg Hx   . Asthma Daughter   . Thyroid disease Mother   . Heart Problems  Mother     Social History  Substance Use Topics  . Smoking status: Never Smoker   . Smokeless tobacco: Never Used  . Alcohol Use: No    Allergies:  Allergies  Allergen Reactions  . Hydromorphone Shortness Of Breath and Nausea And Vomiting  . Morphine Nausea And Vomiting  . Diphenhydramine Hives    Prescriptions prior to admission  Medication Sig Dispense Refill Last Dose  . clonazePAM (KLONOPIN) 0.5 MG tablet Take 0.25-0.5 tablets by mouth 2 (two) times daily as needed for anxiety. Reported on 12/03/2015  0 12/27/2015 at Unknown time  . famotidine (PEPCID) 20 MG tablet Take 1 tablet (20 mg total) by mouth daily as needed for heartburn or indigestion. 30 tablet 0 12/27/2015 at Unknown time  . hydrochlorothiazide (HYDRODIURIL) 12.5 MG tablet Take 1 tablet (12.5 mg total) by mouth daily. 90 tablet 3 12/27/2015 at Unknown time  . misoprostol (CYTOTEC) 200 MCG tablet Take 600 mcg by mouth once. PRIOR TO A PROCEDURE.   12/27/2015 at 2345  . propranolol (INDERAL) 20 MG tablet Take 10 mg by mouth 2 (two)  times daily.   12/27/2015 at 1900  . Vitamin D, Ergocalciferol, (DRISDOL) 50000 UNITS CAPS capsule TAKE 1 CAPSULE BY MOUTH WEEKLY.- TUESDAYS   12/22/2015  . busPIRone (BUSPAR) 10 MG tablet Take 1 tablet (10 mg total) by mouth 3 (three) times daily. (Patient not taking: Reported on 12/28/2015) 60 tablet 11 Taking   Results for orders placed or performed during the hospital encounter of 12/28/15 (from the past 48 hour(s))  Urinalysis, Routine w reflex microscopic (not at [facility])     Status: Abnormal   Collection Time: 12/28/15  6:18 AM  Result Value Ref Range   Color, Urine YELLOW YELLOW   APPearance CLEAR CLEAR   Specific Gravity, Urine 1.008 1.005 - 1.030   pH 5.5 5.0 - 8.0   Glucose, UA NEGATIVE NEGATIVE mg/dL   Hgb urine dipstick MODERATE (A) NEGATIVE   Bilirubin Urine NEGATIVE NEGATIVE   Ketones, ur NEGATIVE NEGATIVE mg/dL   Protein, ur NEGATIVE NEGATIVE mg/dL   Nitrite NEGATIVE NEGATIVE    Leukocytes, UA NEGATIVE NEGATIVE  Urine microscopic-add on     Status: Abnormal   Collection Time: 12/28/15  6:18 AM  Result Value Ref Range   Squamous Epithelial / LPF 0-5 (A) NONE SEEN   WBC, UA 0-5 0 - 5 WBC/hpf   RBC / HPF 0-5 0 - 5 RBC/hpf   Bacteria, UA RARE (A) NONE SEEN  POC Urine Pregnancy, ED (do NOT order at [facility])     Status: Abnormal   Collection Time: 12/28/15  6:24 AM  Result Value Ref Range   Preg Test, Ur POSITIVE (A) NEGATIVE    Comment:        THE SENSITIVITY OF THIS METHODOLOGY IS >24 mIU/mL   CBC with Differential/Platelet     Status: Abnormal   Collection Time: 12/28/15  6:34 AM  Result Value Ref Range   WBC 7.5 4.0 - 10.5 K/uL   RBC 4.17 3.87 - 5.11 MIL/uL   Hemoglobin 12.2 12.0 - 15.0 g/dL   HCT 34.6 (L) 36.0 - 46.0 %   MCV 83.0 78.0 - 100.0 fL   MCH 29.3 26.0 - 34.0 pg   MCHC 35.3 30.0 - 36.0 g/dL   RDW 12.9 11.5 - 15.5 %   Platelets 393 150 - 400 K/uL   Neutrophils Relative % 60 %   Neutro Abs 4.5 1.7 - 7.7 K/uL   Lymphocytes Relative 30 %   Lymphs Abs 2.3 0.7 - 4.0 K/uL   Monocytes Relative 8 %   Monocytes Absolute 0.6 0.1 - 1.0 K/uL   Eosinophils Relative 2 %   Eosinophils Absolute 0.1 0.0 - 0.7 K/uL   Basophils Relative 0 %   Basophils Absolute 0.0 0.0 - 0.1 K/uL  hCG, quantitative, pregnancy     Status: Abnormal   Collection Time: 12/28/15  6:34 AM  Result Value Ref Range   hCG, Beta Chain, Quant, S 148 (H) <5 mIU/mL    Comment:          GEST. AGE      CONC.  (mIU/mL)   <=1 WEEK        5 - 50     2 WEEKS       50 - 500     3 WEEKS       100 - 10,000     4 WEEKS     1,000 - 30,000     5 WEEKS     3,500 - 115,000   6-8 WEEKS  12,000 - 270,000    12 WEEKS     15,000 - 220,000        FEMALE AND NON-PREGNANT FEMALE:     LESS THAN 5 mIU/mL   I-Stat Beta hCG blood, ED ([facility], [facility], [facility] only)     Status: Abnormal   Collection Time: 12/28/15  6:37 AM  Result Value Ref Range   I-stat hCG, quantitative 191.6 (H) <5 mIU/mL   Comment 3             Comment:   GEST. AGE      CONC.  (mIU/mL)   <=1 WEEK        5 - 50     2 WEEKS       50 - 500     3 WEEKS       100 - 10,000     4 WEEKS     1,000 - 30,000        FEMALE AND NON-PREGNANT FEMALE:     LESS THAN 5 mIU/mL   I-Stat Chem 8, ED     Status: Abnormal   Collection Time: 12/28/15  6:39 AM  Result Value Ref Range   Sodium 138 135 - 145 mmol/L   Potassium 3.8 3.5 - 5.1 mmol/L   Chloride 100 (L) 101 - 111 mmol/L   BUN 6 6 - 20 mg/dL   Creatinine, Ser 0.70 0.44 - 1.00 mg/dL   Glucose, Bld 97 65 - 99 mg/dL   Calcium, Ion 1.12 1.12 - 1.23 mmol/L   TCO2 26 0 - 100 mmol/L   Hemoglobin 12.9 12.0 - 15.0 g/dL   HCT 38.0 36.0 - 46.0 %  ABO/Rh     Status: None   Collection Time: 12/28/15  7:00 AM  Result Value Ref Range   ABO/RH(D) A POS    US Ob Comp Less 14 Wks  12/28/2015  CLINICAL DATA:  Abnormal bleeding in pregnancy. Quantitative beta HCG is 191.6. LMP is01/08/2016. Gestational age by LMP is2 weeks 6 days. EDC by LMP is 09/13/2016. EXAM: OBSTETRIC <14 WK [place] AND TRANSVAGINAL OB US TECHNIQUE: Both transabdominal and transvaginal ultrasound examinations were performed for complete evaluation of the gestation as well as the maternal uterus, adnexal regions, and pelvic cul-de-sac. Transvaginal technique was performed to assess early pregnancy. COMPARISON:  CT of the abdomen and pelvis on 11/25/2015 FINDINGS: Intrauterine gestational sac: Not seen Yolk sac:  Not seen Embryo:  Not seen Cardiac Activity: Absent Subchorionic hemorrhage:  None visualized. Maternal uterus/adnexae: Endometrial stripe is thin and homogeneous. Fundal fibroid is 6.0 x 4.6 x 5.0 cm. Left fundal fibroid is 4.5 x 4.2 x 3.7 cm. Small amount of fluid is identified in the right adnexal region. IMPRESSION: 1. Uterine fibroids present. 2. No intrauterine or adnexal pregnancy identified. Serial quantitative beta HCG values and follow-up ultrasound are recommended as appropriate to document progression of and location of pregnancy.  Ectopic pregnancy has not been excluded. Electronically Signed   By: Nolon Nations M.D.   On: 12/28/2015 08:59   US Ob Transvaginal  12/28/2015  CLINICAL DATA:  Abnormal bleeding in pregnancy. Quantitative beta HCG is 191.6. LMP is01/08/2016. Gestational age by LMP is2 weeks 6 days. EDC by LMP is 09/13/2016. EXAM: OBSTETRIC <14 WK [place] AND TRANSVAGINAL OB US TECHNIQUE: Both transabdominal and transvaginal ultrasound examinations were performed for complete evaluation of the gestation as well as the maternal uterus, adnexal regions, and pelvic cul-de-sac. Transvaginal technique was performed to assess early pregnancy. COMPARISON:  CT  of the abdomen and pelvis on 11/25/2015 FINDINGS: Intrauterine gestational sac: Not seen Yolk sac:  Not seen Embryo:  Not seen Cardiac Activity: Absent Subchorionic hemorrhage:  None visualized. Maternal uterus/adnexae: Endometrial stripe is thin and homogeneous. Fundal fibroid is 6.0 x 4.6 x 5.0 cm. Left fundal fibroid is 4.5 x 4.2 x 3.7 cm. Small amount of fluid is identified in the right adnexal region. IMPRESSION: 1. Uterine fibroids present. 2. No intrauterine or adnexal pregnancy identified. Serial quantitative beta HCG values and follow-up ultrasound are recommended as appropriate to document progression of and location of pregnancy. Ectopic pregnancy has not been excluded. Electronically Signed   By: Nolon Nations M.D.   On: 12/28/2015 08:59    Review of Systems  Constitutional: Negative for fever and chills.  Gastrointestinal: Positive for nausea, vomiting, abdominal pain and diarrhea.   Physical Exam   Blood pressure 115/71, pulse 76, temperature 98.7 F (37.1 C), temperature source Oral, resp. rate 18, height 5\' 6"  (1.676 m), weight 187 lb (84.823 kg), last menstrual period 12/08/2015, SpO2 100 %, unknown if currently breastfeeding.  Physical Exam  Constitutional: She is oriented to person, place, and time. She appears well-developed and well-nourished. No  distress.  HENT:  Head: Normocephalic.  Eyes: Pupils are equal, round, and reactive to light.  GI: Soft. She exhibits no distension. There is no tenderness. There is no rebound.  Musculoskeletal: Normal range of motion.  Neurological: She is alert and oriented to person, place, and time.  Skin: Skin is warm. She is not diaphoretic.  Psychiatric: Her behavior is normal.    MAU Course  Procedures  None  MDM  Discussed patient with Dr. Nehemiah Settle A positive blood type   Assessment and Plan   A:  1. Pregnancy of unknown anatomic location   2. Medication side effect, initial encounter   3. Abdominal pain in pregnancy   4. Positive pregnancy test   5. Threatened abortion     P:  Discharge home in stable condition Return to the [facility] Wednesday @ 1100 for repeat Quant Ectopic precautions Pelvic rest Return to MAU if symptoms worsen   Lezlie Lye, NP 12/28/2015 3:44 PM

## 2015-12-29 ENCOUNTER — Inpatient Hospital Stay (HOSPITAL_COMMUNITY): Payer: BLUE CROSS/BLUE SHIELD

## 2015-12-29 ENCOUNTER — Encounter (HOSPITAL_COMMUNITY): Payer: Self-pay | Admitting: *Deleted

## 2015-12-29 ENCOUNTER — Inpatient Hospital Stay (HOSPITAL_COMMUNITY)
Admission: AD | Admit: 2015-12-29 | Discharge: 2015-12-29 | Disposition: A | Discharge status: Home / Self Care | Payer: BLUE CROSS/BLUE SHIELD | Source: Ambulatory Visit | Attending: Obstetrics & Gynecology | Admitting: Obstetrics & Gynecology

## 2015-12-29 DIAGNOSIS — R109 Unspecified abdominal pain: Secondary | ICD-10-CM | POA: Insufficient documentation

## 2015-12-29 DIAGNOSIS — Z8759 Personal history of other complications of pregnancy, childbirth and the puerperium: Secondary | ICD-10-CM

## 2015-12-29 DIAGNOSIS — O009 Unspecified ectopic pregnancy without intrauterine pregnancy: Secondary | ICD-10-CM

## 2015-12-29 DIAGNOSIS — O26899 Other specified pregnancy related conditions, unspecified trimester: Secondary | ICD-10-CM

## 2015-12-29 HISTORY — DX: Unspecified abnormal cytological findings in specimens from vagina: R87.629

## 2015-12-29 LAB — CBC
HEMATOCRIT: 35.3 % — AB (ref 36.0–46.0)
Hemoglobin: 12.4 g/dL (ref 12.0–15.0)
MCH: 28.6 pg (ref 26.0–34.0)
MCHC: 35.1 g/dL (ref 30.0–36.0)
MCV: 81.5 fL (ref 78.0–100.0)
Platelets: 401 10*3/uL — ABNORMAL HIGH (ref 150–400)
RBC: 4.33 MIL/uL (ref 3.87–5.11)
RDW: 13 % (ref 11.5–15.5)
WBC: 7.9 10*3/uL (ref 4.0–10.5)

## 2015-12-29 LAB — HCG, QUANTITATIVE, PREGNANCY: hCG, Beta Chain, Quant, S: 163 m[IU]/mL — ABNORMAL HIGH (ref <5)

## 2015-12-29 MED ORDER — ONDANSETRON HCL 4 MG PO TABS
8.0000 mg | ORAL_TABLET | Freq: Once | ORAL | Status: AC
  Administered 2015-12-29: 8 mg via ORAL
  Filled 2015-12-29 (×2): qty 2

## 2015-12-29 MED ORDER — HYDROCHLOROTHIAZIDE 12.5 MG PO CAPS
12.5000 mg | ORAL_CAPSULE | Freq: Once | ORAL | Status: AC
  Administered 2015-12-29: 12.5 mg via ORAL
  Filled 2015-12-29: qty 1

## 2015-12-29 MED ORDER — TRAMADOL HCL 50 MG PO TABS: 50.0000 mg | ORAL_TABLET | Freq: Four times a day (QID) | ORAL | Status: DC | PRN

## 2015-12-29 MED ORDER — METHOTREXATE INJECTION FOR WOMEN'S HOSPITAL
50.0000 mg/m2 | Freq: Once | INTRAMUSCULAR | Status: AC
  Administered 2015-12-29: 100 mg via INTRAMUSCULAR
  Filled 2015-12-29: qty 2

## 2015-12-29 NOTE — Progress Notes (Signed)
Pt was tearful at the news of an ectopic pregnancy.  This pregnancy had been the hope of better things to come for her and with its loss, the loss that she feels in other aspects of her life are compounded.  She shared about the complicated relationship she has with her husband, from whom she recently separated.    We spent time on spiritual concerns and utilized some relaxation techniques to help her focus.  She has support from friends and from her mother, who will be with her tonight.  Chaplain Janne Napoleon, Bcc Pager, [phone] 2:53 PM    12/29/15 1400  Clinical Encounter Type  Visited With Patient  Visit Type Spiritual support  Referral From Physician  Spiritual Encounters  Spiritual Needs Emotional;Grief support  Stress Factors  Patient Stress Factors Loss;Family relationships (Ectopic Pregnancy)

## 2015-12-29 NOTE — Discharge Instructions (Signed)
Ectopic Pregnancy °An ectopic pregnancy is when the fertilized egg attaches (implants) outside the uterus. Most ectopic pregnancies occur in the fallopian tube. Rarely do ectopic pregnancies occur on the ovary, intestine, pelvis, or cervix. In an ectopic pregnancy, the fertilized egg does not have the ability to develop into a normal, healthy baby.  °A ruptured ectopic pregnancy is one in which the fallopian tube gets torn or bursts and results in internal bleeding. Often there is intense abdominal pain, and sometimes, vaginal bleeding. Having an ectopic pregnancy can be life threatening. If left untreated, this dangerous condition can lead to a blood transfusion, abdominal surgery, or even death. °CAUSES  °Damage to the fallopian tubes is the suspected cause in most ectopic pregnancies.  °RISK FACTORS °Depending on your circumstances, the risk of having an ectopic pregnancy will vary. The level of risk can be divided into three categories. °High Risk °· You have gone through infertility treatment. °· You have had a previous ectopic pregnancy. °· You have had previous tubal surgery. °· You have had previous surgery to have the fallopian tubes tied (tubal ligation). °· You have tubal problems or diseases. °· You have been exposed to DES. DES is a medicine that was used until 1971 and had effects on babies whose mothers took the medicine. °· You become pregnant while using an intrauterine device (IUD) for birth control.  °Moderate Risk °· You have a history of infertility. °· You have a history of a sexually transmitted infection (STI). °· You have a history of pelvic inflammatory disease (PID). °· You have scarring from endometriosis. °· You have multiple sexual partners. °· You smoke.  °Low Risk °· You have had previous pelvic surgery. °· You use vaginal douching. °· You became sexually active before 45 years of age. °SIGNS AND SYMPTOMS  °An ectopic pregnancy should be suspected in anyone who has missed a period and  has abdominal pain or bleeding. °· You may experience normal pregnancy symptoms, such as: °· Nausea. °· Tiredness. °· Breast tenderness. °· Other symptoms may include: °· Pain with intercourse. °· Irregular vaginal bleeding or spotting. °· Cramping or pain on one side or in the lower abdomen. °· Fast heartbeat. °· Passing out while having a bowel movement. °· Symptoms of a ruptured ectopic pregnancy and internal bleeding may include: °· Sudden, severe pain in the abdomen and pelvis. °· Dizziness or fainting. °· Pain in the shoulder area. °DIAGNOSIS  °Tests that may be performed include: °· A pregnancy test. °· An ultrasound test. °· Testing the specific level of pregnancy hormone in the bloodstream. °· Taking a sample of uterus tissue (dilation and curettage, D&C). °· Surgery to perform a visual exam of the inside of the abdomen using a thin, lighted tube with a tiny camera on the end (laparoscope). °TREATMENT  °An injection of a medicine called methotrexate may be given. This medicine causes the pregnancy tissue to be absorbed. It is given if: °· The diagnosis is made early. °· The fallopian tube has not ruptured. °· You are considered to be a good candidate for the medicine. °Usually, pregnancy hormone blood levels are checked after methotrexate treatment. This is to be sure the medicine is effective. It may take 4-6 weeks for the pregnancy to be absorbed (though most pregnancies will be absorbed by 3 weeks). °Surgical treatment may be needed. A laparoscope may be used to remove the pregnancy tissue. If severe internal bleeding occurs, a cut (incision) may be made in the lower abdomen (laparotomy), and the ectopic   pregnancy is removed. This stops the bleeding. Part of the fallopian tube, or the whole tube, may be removed as well (salpingectomy). After surgery, pregnancy hormone tests may be done to be sure there is no pregnancy tissue left. You may receive a Rho (D) immune globulin shot if you are Rh negative and  the father is Rh positive, or if you do not know the Rh type of the father. This is to prevent problems with any future pregnancy. SEEK IMMEDIATE MEDICAL CARE IF:  You have any symptoms of an ectopic pregnancy. This is a medical emergency. MAKE SURE YOU:  Understand these instructions.  Will watch your condition.  Will get help right away if you are not doing well or get worse.   This information is not intended to replace advice given to you by your health care provider. Make sure you discuss any questions you have with your health care provider.   Document Released: 12/22/2004 Document Revised: 12/05/2014 Document Reviewed: 06/13/2013 Elsevier Interactive Patient Education 2016 [place].  Methotrexate Treatment for an Ectopic Pregnancy, Care After Refer to this sheet in the next few weeks. These instructions provide you with information on caring for yourself after your procedure. Your health care provider may also give you more specific instructions. Your treatment has been planned according to current medical practices, but problems sometimes occur. Call your health care provider if you have any problems or questions after your procedure. WHAT TO EXPECT AFTER THE PROCEDURE You may have some abdominal cramping, vaginal bleeding, and fatigue in the first few days after taking methotrexate. Some other possible side effects of methotrexate include:  Nausea.  Vomiting.  Diarrhea.  Mouth sores.  Swelling or irritation of the lining of your lungs (pneumonitis).  Liver damage.  Hair loss. HOME CARE INSTRUCTIONS  After you have received the methotrexate medicine, you need to be careful of your activities and watch your condition for several weeks. It may take 1 week before your hormone levels return to normal.  Keep all follow-up appointments as directed by your health care provider.  Avoid traveling too far away from your health care provider.  Do not have sexual intercourse  until your health care provider says it is safe to do so.  You may resume your usual diet.  Limit strenuous activity.  Do not take folic acid, prenatal vitamins, or other vitamins that contain folic acid.  Do not take aspirin, ibuprofen, or naproxen (nonsteroidal anti-inflammatory drugs [NSAIDs]).  Do not drink alcohol. SEEK MEDICAL CARE IF:   You cannot control your nausea and vomiting.  You cannot control your diarrhea.  You have sores in your mouth and want treatment.  You need pain medicine for your abdominal pain.  You have a rash.  You are having a reaction to the medicine. SEEK IMMEDIATE MEDICAL CARE IF:   You have increasing abdominal or pelvic pain.  You notice increased bleeding.  You feel light-headed, or you faint.  You have shortness of breath.  Your heart rate increases.  You have a cough.  You have chills.  You have a fever.   This information is not intended to replace advice given to you by your health care provider. Make sure you discuss any questions you have with your health care provider.   Document Released: 11/03/2011 Document Revised: 11/19/2013 Document Reviewed: 09/02/2013 Elsevier Interactive Patient Education [employer].

## 2015-12-29 NOTE — MAU Note (Signed)
Patient ready for D/C. Currently chaplain is visiting with patient.

## 2015-12-29 NOTE — MAU Note (Signed)
Urine in lab 

## 2015-12-29 NOTE — MAU Note (Signed)
Pt was seen here yesterday sent from [facility] after taking a medication to prepare for a biopsy  yesterday.  After medication was given, pt reports she was told she had a positive pregnancy test.  Took pt by surprise since she just finished her period that past Friday.  Pt states it was really red and heavier than normal which was one of the reason they decided to preform a biopsy.  Pt was to follow up for bloodwork on Wed at 1100 in the clinic, however pt has experienced more abd pain and the pain is different.  Last night pain began in rt lower abd, sharp in nature.  Pt states since taking medication she has been having really bad upper center abd pain.  Pt states she can't eat much.  No vaginal bleeding but pt is very uncomfortable.  Pt has hx of ectopic pregnancy.  Pt was concerned with the new pain.

## 2015-12-29 NOTE — MAU Provider Note (Signed)
Chief Complaint: Abdominal Pain   First Provider Initiated Contact with Patient 12/29/15 1000     SUBJECTIVE HPI: Stacy [name] is a 45 y.o. [id number] at [redacted]w[redacted]d by irreg LMP who presents to Maternity Admissions reporting increased abd pain. Was seen  In MAU yesterday, 12/28/15 for abd pain and bleeding in early pregnancy. No adnexal mass or IUP seen on [place].  Quant was 148. Blood type A pos.     Has complex medical Hx (some of which may have a strong psych component). Is under the care of cardiologist for ongoing chest pain w/out evidence of cardiac Dz. Has appt w/ GI 2/2 for ongoing problems w/ GERD and poor appetite.   Location: Epigastric Quality: cramping Severity: 9/10 on pain scale Duration: Chronic, but worse since this morning Context: None Timing: Constant Modifying factors: Some improvement w/ Pepcid Associated signs and symptoms: No further VB. Pos for mild nausea, Neg for fever, chills, vomiting, diarrhea, constipation. Has had poor appetite for several months.    Past Medical History  Diagnosis Date  . Esophageal stricture   . Chest pain 09/14/2009    no pulmonary embolus by chest CT  . Palpitations   . Elevated BP   . Anxiety     past Hx  . Depression     past Hx  . GERD (gastroesophageal reflux disease)     no meds  . Sickle cell trait ([place])   . Fibroid   . Abnormal Pap smear     cryo, normal since  . Fibroids 09/24/2015  . Hypertension   . Essential hypertension 09/28/2009    Qualifier: Diagnosis of  By: [name] FNP, [place]    . Arrhythmia   . Vaginal Pap smear, abnormal    OB History  Gravida Para Term Preterm AB SAB TAB Ectopic Multiple Living  7 2 2  0 3 0 0 3 0 2    # Outcome Date GA Lbr Len/2nd Weight Sex Delivery Anes PTL Lv  7 Current           6 Gravida           5 Ectopic              Comments: mtx  4 Ectopic              Comments: mtx  3 Ectopic              Comments: srugery- living ectopic  2 Term      Vag-Spont     1 Term       CS-LTranv        Comments: breech     Past Surgical History  Procedure Laterality Date  . Cesarean section  2004  . Laparoscopy for ectopic pregnancy  2001  . Wisdom tooth extraction  2012   Social History   Social History  . Marital Status: Legally Separated    Spouse Name: N/A  . Number of Children: 2  . Years of Education: N/A   Occupational History  .  [employer]   Social History Main Topics  . Smoking status: Never Smoker   . Smokeless tobacco: Never Used  . Alcohol Use: No  . Drug Use: No  . Sexual Activity:    Partners: Male    Birth Control/ Protection: Condom   Other Topics Concern  . Not on file   Social History Narrative   Epworth Sleepiness Scale = 5 (as of 09/16/2015)   Current Facility-Administered Medications on  File Prior to Encounter  Medication Dose Route Frequency Provider Last Rate Last Dose  . iohexol (OMNIPAQUE) 300 MG/ML solution 100 mL  100 mL Intravenous Once PRN Jola Schmidt, MD       Current Outpatient Prescriptions on File Prior to Encounter  Medication Sig Dispense Refill  . hydrochlorothiazide (HYDRODIURIL) 12.5 MG tablet Take 1 tablet (12.5 mg total) by mouth daily. 90 tablet 3  . propranolol (INDERAL) 20 MG tablet Take 10 mg by mouth 2 (two) times daily.    . Vitamin D, Ergocalciferol, (DRISDOL) 50000 UNITS CAPS capsule TAKE 1 CAPSULE BY MOUTH WEEKLY.- WEDNESDAYS     Allergies  Allergen Reactions  . Hydromorphone Shortness Of Breath and Nausea And Vomiting  . Morphine Nausea And Vomiting  . Diphenhydramine Hives    I have reviewed the past Medical Hx, Surgical Hx, Social Hx, Allergies and Medications.   Review of Systems  Constitutional: Positive for appetite change and unexpected weight change. Negative for fever and chills.  Cardiovascular: Negative for chest pain.  Gastrointestinal: Positive for nausea and abdominal pain. Negative for vomiting, diarrhea and constipation.  Genitourinary: Negative for vaginal bleeding.     OBJECTIVE Patient Vitals for the past 24 hrs:  BP Temp Temp src Pulse Resp SpO2  12/29/15 1403 118/72 mmHg 98.3 F (36.8 C) Oral 85 18 -  12/29/15 0939 111/72 mmHg 98.2 F (36.8 C) Oral 84 18 100 %   Constitutional: Well-developed, well-nourished female in mild distress. Anxious.  Cardiovascular: normal rate Respiratory: normal rate and effort.  GI: Abd soft, mild epigastric tenderness. No low abd tenderness. Pos BS x 4 Neurologic: Alert and oriented x 4.  GU: Deferred  LAB RESULTS Results for orders placed or performed during the hospital encounter of 12/29/15 (from the past 24 hour(s))  CBC     Status: Abnormal   Collection Time: 12/29/15 10:10 AM  Result Value Ref Range   WBC 7.9 4.0 - 10.5 K/uL   RBC 4.33 3.87 - 5.11 MIL/uL   Hemoglobin 12.4 12.0 - 15.0 g/dL   HCT 35.3 (L) 36.0 - 46.0 %   MCV 81.5 78.0 - 100.0 fL   MCH 28.6 26.0 - 34.0 pg   MCHC 35.1 30.0 - 36.0 g/dL   RDW 13.0 11.5 - 15.5 %   Platelets 401 (H) 150 - 400 K/uL  hCG, quantitative, pregnancy     Status: Abnormal   Collection Time: 12/29/15 10:10 AM  Result Value Ref Range   hCG, Beta Chain, Quant, S 163 (H) <5 mIU/mL    IMAGING US Ob Comp Less 14 Wks  12/28/2015  CLINICAL DATA:  Abnormal bleeding in pregnancy. Quantitative beta HCG is 191.6. LMP is01/08/2016. Gestational age by LMP is2 weeks 6 days. EDC by LMP is 09/13/2016. EXAM: OBSTETRIC <14 WK [place] AND TRANSVAGINAL OB US TECHNIQUE: Both transabdominal and transvaginal ultrasound examinations were performed for complete evaluation of the gestation as well as the maternal uterus, adnexal regions, and pelvic cul-de-sac. Transvaginal technique was performed to assess early pregnancy. COMPARISON:  CT of the abdomen and pelvis on 11/25/2015 FINDINGS: Intrauterine gestational sac: Not seen Yolk sac:  Not seen Embryo:  Not seen Cardiac Activity: Absent Subchorionic hemorrhage:  None visualized. Maternal uterus/adnexae: Endometrial stripe is thin and homogeneous.  Fundal fibroid is 6.0 x 4.6 x 5.0 cm. Left fundal fibroid is 4.5 x 4.2 x 3.7 cm. Small amount of fluid is identified in the right adnexal region. IMPRESSION: 1. Uterine fibroids present. 2. No intrauterine or adnexal pregnancy identified.  Serial quantitative beta HCG values and follow-up ultrasound are recommended as appropriate to document progression of and location of pregnancy. Ectopic pregnancy has not been excluded. Electronically Signed   By: Nolon Nations M.D.   On: 12/28/2015 08:59   US Ob Transvaginal  12/28/2015  CLINICAL DATA:  Abnormal bleeding in pregnancy. Quantitative beta HCG is 191.6. LMP is01/08/2016. Gestational age by LMP is2 weeks 6 days. EDC by LMP is 09/13/2016. EXAM: OBSTETRIC <14 WK [place] AND TRANSVAGINAL OB US TECHNIQUE: Both transabdominal and transvaginal ultrasound examinations were performed for complete evaluation of the gestation as well as the maternal uterus, adnexal regions, and pelvic cul-de-sac. Transvaginal technique was performed to assess early pregnancy. COMPARISON:  CT of the abdomen and pelvis on 11/25/2015 FINDINGS: Intrauterine gestational sac: Not seen Yolk sac:  Not seen Embryo:  Not seen Cardiac Activity: Absent Subchorionic hemorrhage:  None visualized. Maternal uterus/adnexae: Endometrial stripe is thin and homogeneous. Fundal fibroid is 6.0 x 4.6 x 5.0 cm. Left fundal fibroid is 4.5 x 4.2 x 3.7 cm. Small amount of fluid is identified in the right adnexal region. IMPRESSION: 1. Uterine fibroids present. 2. No intrauterine or adnexal pregnancy identified. Serial quantitative beta HCG values and follow-up ultrasound are recommended as appropriate to document progression of and location of pregnancy. Ectopic pregnancy has not been excluded. Electronically Signed   By: Nolon Nations M.D.   On: 12/28/2015 08:59  US Ob Comp Less 14 Wks  12/29/2015  CLINICAL DATA:  45 year old pregnant female with a history of ectopic pregnancy x 3 presents with 2 days of mid  abdominal pain. Quantitative beta HCG of 148 on 12/28/2015. Repeat quantitative beta HCG level pending. EDC by LMP: 09/13/2016, projecting to an expected gestational age of [redacted] weeks 0 days EXAM: OBSTETRIC <14 WK [place] AND TRANSVAGINAL OB US TECHNIQUE: Both transabdominal and transvaginal ultrasound examinations were performed for complete evaluation of the gestation as well as the maternal uterus, adnexal regions, and pelvic cul-de-sac. Transvaginal technique was performed to assess early pregnancy. COMPARISON:  12/28/2015 obstetric scan. FINDINGS: The uterus is retroverted and retroflexed, measuring 10.3 x 7.1 x 6.9 cm in size. The uterus is enlarged by uterine fibroids as follows: - left anterior uterine body subserosal 4.5 x 4.4 x 3.6 cm fibroid - right fundal intramural 4.8 x 4.5 x 4.9 cm fibroid Endometrial visualization is limited by distortion by the surrounding fibroids. No intrauterine gestational sac is demonstrated. No endometrial cavity fluid or focal endometrial mass is demonstrated. The left ovary measures 1.9 x 0.8 x 1.3 cm. The right ovary is not visualized. No suspicious ovarian or adnexal masses are demonstrated. No abnormal free fluid is seen in the pelvis. IMPRESSION: 1. Non localization of the pregnancy by ultrasound. The sonographic differential diagnosis remains an intrauterine gestation too early to visualize, a spontaneous abortion or an occult ectopic gestation. Continued close clinical follow-up and serial serum beta HCG monitoring are advised. Follow-up obstetric sonography as clinically warranted. 2. Enlarged myomatous uterus. 3. Normal left ovary. Nonvisualization of the right ovary. No suspicious adnexal findings. No abnormal free fluid in the pelvis. Electronically Signed   By: Ilona Sorrel M.D.   On: 12/29/2015 11:22   US Ob Transvaginal  12/29/2015  CLINICAL DATA:  44 year old pregnant female with a history of ectopic pregnancy x 3 presents with 2 days of mid abdominal pain.  Quantitative beta HCG of 148 on 12/28/2015. Repeat quantitative beta HCG level pending. EDC by LMP: 09/13/2016, projecting to an expected gestational age of [redacted] weeks  0 days EXAM: OBSTETRIC <14 WK [place] AND TRANSVAGINAL OB US TECHNIQUE: Both transabdominal and transvaginal ultrasound examinations were performed for complete evaluation of the gestation as well as the maternal uterus, adnexal regions, and pelvic cul-de-sac. Transvaginal technique was performed to assess early pregnancy. COMPARISON:  12/28/2015 obstetric scan. FINDINGS: The uterus is retroverted and retroflexed, measuring 10.3 x 7.1 x 6.9 cm in size. The uterus is enlarged by uterine fibroids as follows: - left anterior uterine body subserosal 4.5 x 4.4 x 3.6 cm fibroid - right fundal intramural 4.8 x 4.5 x 4.9 cm fibroid Endometrial visualization is limited by distortion by the surrounding fibroids. No intrauterine gestational sac is demonstrated. No endometrial cavity fluid or focal endometrial mass is demonstrated. The left ovary measures 1.9 x 0.8 x 1.3 cm. The right ovary is not visualized. No suspicious ovarian or adnexal masses are demonstrated. No abnormal free fluid is seen in the pelvis. IMPRESSION: 1. Non localization of the pregnancy by ultrasound. The sonographic differential diagnosis remains an intrauterine gestation too early to visualize, a spontaneous abortion or an occult ectopic gestation. Continued close clinical follow-up and serial serum beta HCG monitoring are advised. Follow-up obstetric sonography as clinically warranted. 2. Enlarged myomatous uterus. 3. Normal left ovary. Nonvisualization of the right ovary. No suspicious adnexal findings. No abnormal free fluid in the pelvis. Electronically Signed   By: Ilona Sorrel M.D.   On: 12/29/2015 11:22   MAU COURSE CBC, Quant, [place], NPO.  Discussed patient's history of ectopic pregnancy 3, exam, minimal rise in Quant with Dr. Ihor Dow. Recommends treating as ectopic pregnancy  with methotrexate. Patient agrees with plan of care. Methotrexate given.  Chaplain in to see pt.  MDM 45 year old female with presumed ectopic pregnancy based on minimal rise in Quant, abdominal pain and bleeding and history of ectopic pregnancy 3. Hemodynamically stable.  ASSESSMENT 1. Ectopic pregnancy   2. History of ectopic pregnancy   3. Abdominal pain affecting pregnancy, antepartum    PLAN Discharge home in stable condition. Ectopic Precautions Support given. Rx Ultram     Follow-up Information    Follow up with [facility] On 01/01/2016.   Specialty:  Obstetrics and Gynecology   Why:  For repeat blood work   Contact information:   [address] [place] [phone]      Follow up with [facility] On 01/04/2016.   Specialty:  Obstetrics and Gynecology   Why:  11:00 for bloodwork and 1:00 for follow-up appointment   Contact information:   [employer] [place] [phone]       Medication List    TAKE these medications        famotidine 40 MG tablet  Commonly known as:  PEPCID  Take 40 mg by mouth 2 (two) times daily as needed for heartburn or indigestion.     hydrochlorothiazide 12.5 MG tablet  Commonly known as:  HYDRODIURIL  Take 1 tablet (12.5 mg total) by mouth daily.     KLONOPIN 0.5 MG tablet  Generic drug:  clonazePAM  Take 0.5 mg by mouth daily as needed for anxiety.     propranolol 20 MG tablet  Commonly known as:  INDERAL  Take 10 mg by mouth 2 (two) times daily.     traMADol 50 MG tablet  Commonly known as:  ULTRAM  Take 1-2 tablets (50-100 mg total) by mouth every 6 (six) hours as needed for severe pain.     Vitamin  D (Ergocalciferol) 50000 units Caps capsule  Commonly known as:  DRISDOL  TAKE 1 CAPSULE BY MOUTH WEEKLY.- [place], CNM 12/29/2015  2:39 PM

## 2015-12-29 NOTE — MAU Provider Note (Signed)
History     CSN: [id number]  Arrival date & time 12/29/15  [id number]   First Provider Initiated Contact with Patient 12/29/15 1000      Chief Complaint  Patient presents with  . Abdominal Pain    HPI   Ms.Shristi B Payne-Redlich is a 45 y.o. female [id number] with a history of ectopic X 3 and DUP who returns to the MAU today with complaints of abdominal pain. She was seen in the MAU yesterday with abdominal pain and vaginal bleeding, which has since stopped. Currently experiencing intermittent 5/10 RLQ pain and constant 10/10 epigastric pain. Patient was scheduled to have an endometrial biopsy and IUD placement on 12/28/2015 and was given Misoprostol to take the day before the appointment (negative pregnancy test on 1/18). She experienced significant abdominal pain with light bleeding after she took Misoprostol in the evening on 12/27/15. Found to have positive pregnancy test after presenting to the ED on 12/28/15 for this abdominal pain and bleeding and was sent to the MAU. Quant on 12/28/15 was 148 and received [place]. She was sent home with instructions to f/u in [place] on 1/32/17 and told to return to the MAU if symptoms worsened.  Patient also endorses dome nausea with no vomiting. Patient had non bloody diarrhea yesterday, now resolved. Last PO at 0845 (smoothie). No radiation of epigastric pain, no SOB, no fever, no fatigue.   Past Medical History  Diagnosis Date  . Esophageal stricture   . Chest pain 09/14/2009    no pulmonary embolus by chest CT  . Palpitations   . Elevated BP   . Anxiety     past Hx  . Depression     past Hx  . GERD (gastroesophageal reflux disease)     no meds  . Sickle cell trait ([place])   . Fibroid   . Abnormal Pap smear     cryo, normal since  . Fibroids 09/24/2015  . Hypertension   . Essential hypertension 09/28/2009    Qualifier: Diagnosis of  By: [name] FNP, [place]    . Arrhythmia   . Vaginal Pap smear, abnormal     Past Surgical History  Procedure  Laterality Date  . Cesarean section  2004  . Laparoscopy for ectopic pregnancy  2001  . Wisdom tooth extraction  2012    Family History  Problem Relation Age of Onset  . Asthma Mother   . Hypertension Mother   . Hypertension Paternal Uncle   . Asthma Maternal Grandmother   . Colon cancer Neg Hx   . Esophageal cancer Neg Hx   . Stomach cancer Neg Hx   . Asthma Daughter   . Thyroid disease Mother   . Heart Problems Mother     Social History  Substance Use Topics  . Smoking status: Never Smoker   . Smokeless tobacco: Never Used  . Alcohol Use: No    OB History    Gravida Para Term Preterm AB TAB SAB Ectopic Multiple Living   7 2 2  0 3 0 0 3 0 2      Review of Systems  Constitutional: Positive for appetite change. Negative for fever and chills.  HENT: Negative for congestion, postnasal drip, rhinorrhea and sore throat.   Respiratory: Negative for shortness of breath and wheezing.   Cardiovascular: Negative for chest pain and leg swelling.  Gastrointestinal: Positive for nausea, abdominal pain and diarrhea. Negative for vomiting, constipation, blood in stool and abdominal distention.  Genitourinary: Negative for dysuria, urgency, frequency,  hematuria, vaginal bleeding and vaginal discharge.  Musculoskeletal: Negative for myalgias and arthralgias.  Skin: Negative for color change, pallor and rash.  Neurological: Negative for dizziness, syncope, light-headedness and headaches.    Allergies  Hydromorphone; Morphine; and Diphenhydramine  Home Medications  No current outpatient prescriptions on file.  BP 111/72 mmHg  Pulse 84  Temp(Src) 98.2 F (36.8 C) (Oral)  Resp 18  SpO2 100%  LMP 12/08/2015  Physical Exam  Constitutional: She is oriented to person, place, and time. She appears well-developed and well-nourished.  HENT:  Head: Normocephalic and atraumatic.  Mouth/Throat: Oropharynx is clear and moist.  Eyes: Conjunctivae and EOM are normal. Pupils are equal,  round, and reactive to light.  Cardiovascular: Normal rate, regular rhythm, normal heart sounds and intact distal pulses.   Pulmonary/Chest: Effort normal and breath sounds normal.  Abdominal: She exhibits no distension and no mass. There is no hepatosplenomegaly. There is tenderness (epigastric). There is no rebound, no guarding, no tenderness at McBurney's point and negative Murphy's sign.  Neurological: She is alert and oriented to person, place, and time.  Skin: Skin is warm and dry.  Psychiatric: Her speech is normal. Thought content normal. Her affect is not labile. She is withdrawn. Cognition and memory are normal. She exhibits a depressed mood.  Patient tearful and expresses concern that she might have harmed a viable pregnancy     MAU Course  Procedures (including critical care time)  Labs Reviewed  CBC  HCG, QUANTITATIVE, PREGNANCY   US Ob Comp Less 14 Wks  12/28/2015  CLINICAL DATA:  Abnormal bleeding in pregnancy. Quantitative beta HCG is 191.6. LMP is01/08/2016. Gestational age by LMP is2 weeks 6 days. EDC by LMP is 09/13/2016. EXAM: OBSTETRIC <14 WK [place] AND TRANSVAGINAL OB US TECHNIQUE: Both transabdominal and transvaginal ultrasound examinations were performed for complete evaluation of the gestation as well as the maternal uterus, adnexal regions, and pelvic cul-de-sac. Transvaginal technique was performed to assess early pregnancy. COMPARISON:  CT of the abdomen and pelvis on 11/25/2015 FINDINGS: Intrauterine gestational sac: Not seen Yolk sac:  Not seen Embryo:  Not seen Cardiac Activity: Absent Subchorionic hemorrhage:  None visualized. Maternal uterus/adnexae: Endometrial stripe is thin and homogeneous. Fundal fibroid is 6.0 x 4.6 x 5.0 cm. Left fundal fibroid is 4.5 x 4.2 x 3.7 cm. Small amount of fluid is identified in the right adnexal region. IMPRESSION: 1. Uterine fibroids present. 2. No intrauterine or adnexal pregnancy identified. Serial quantitative beta HCG values and  follow-up ultrasound are recommended as appropriate to document progression of and location of pregnancy. Ectopic pregnancy has not been excluded. Electronically Signed   By: Nolon Nations M.D.   On: 12/28/2015 08:59   US Ob Transvaginal  12/28/2015  CLINICAL DATA:  Abnormal bleeding in pregnancy. Quantitative beta HCG is 191.6. LMP is01/08/2016. Gestational age by LMP is2 weeks 6 days. EDC by LMP is 09/13/2016. EXAM: OBSTETRIC <14 WK [place] AND TRANSVAGINAL OB US TECHNIQUE: Both transabdominal and transvaginal ultrasound examinations were performed for complete evaluation of the gestation as well as the maternal uterus, adnexal regions, and pelvic cul-de-sac. Transvaginal technique was performed to assess early pregnancy. COMPARISON:  CT of the abdomen and pelvis on 11/25/2015 FINDINGS: Intrauterine gestational sac: Not seen Yolk sac:  Not seen Embryo:  Not seen Cardiac Activity: Absent Subchorionic hemorrhage:  None visualized. Maternal uterus/adnexae: Endometrial stripe is thin and homogeneous. Fundal fibroid is 6.0 x 4.6 x 5.0 cm. Left fundal fibroid is 4.5 x 4.2 x 3.7 cm. Small  amount of fluid is identified in the right adnexal region. IMPRESSION: 1. Uterine fibroids present. 2. No intrauterine or adnexal pregnancy identified. Serial quantitative beta HCG values and follow-up ultrasound are recommended as appropriate to document progression of and location of pregnancy. Ectopic pregnancy has not been excluded. Electronically Signed   By: Nolon Nations M.D.   On: 12/28/2015 08:59     1. History of ectopic pregnancy   2. Abdominal pain affecting pregnancy, antepartum       MAU Course  Procedures None  MDM    Assessment      A: Ms.Trudi B Payne-Hinsch is a 45 y.o. female [phone] with a history of ectopic X 3 and DUP who returned to the MAU today with complaints of worsening abdominal pain. Previously found to have positive pregnancy test less than 24 hours after taking Cytotec for a  planned IUD and endometrial biopsy. Hcg was 163 today and 148 1d ago. [place] yesterday and today showed no localization of pregnancy. Ectopic pregnancy is likely given history of 3x ectopics but unable to differentiate from possible induced abortion given that the patient took Cytotec on 1/29. This also raises concern for symptoms being severe side effects from Cytotec.  1. Pregnancy of unknown anatomic location   2. Medication side effect  3. Abdominal pain in pregnancy   4. Positive pregnancy test     P: Discharge home in stable condition. Ectopic precautions Pelvic rest      [place], [place] 12/29/2015 3:35 PM

## 2015-12-30 ENCOUNTER — Ambulatory Visit: Payer: Self-pay | Admitting: Obstetrics & Gynecology

## 2015-12-31 ENCOUNTER — Encounter: Payer: Self-pay | Admitting: Internal Medicine

## 2015-12-31 ENCOUNTER — Ambulatory Visit (INDEPENDENT_AMBULATORY_CARE_PROVIDER_SITE_OTHER): Payer: BLUE CROSS/BLUE SHIELD | Admitting: Internal Medicine

## 2015-12-31 VITALS — BP 114/64 | HR 84 | Ht 66.0 in | Wt 188.4 lb

## 2015-12-31 DIAGNOSIS — R12 Heartburn: Secondary | ICD-10-CM

## 2015-12-31 DIAGNOSIS — K589 Irritable bowel syndrome without diarrhea: Secondary | ICD-10-CM | POA: Diagnosis not present

## 2015-12-31 DIAGNOSIS — R110 Nausea: Secondary | ICD-10-CM

## 2015-12-31 MED ORDER — SUCRALFATE 1 GM/10ML PO SUSP: 1.0000 g | Freq: Three times a day (TID) | ORAL | Status: DC | PRN

## 2015-12-31 MED ORDER — ONDANSETRON HCL 4 MG PO TABS: 4.0000 mg | ORAL_TABLET | Freq: Four times a day (QID) | ORAL | Status: DC | PRN

## 2015-12-31 NOTE — Patient Instructions (Addendum)
We have sent the following medications to your pharmacy for you to pick up at your convenience: Zofran, Carafate.   Follow up as needed.

## 2015-12-31 NOTE — Progress Notes (Signed)
HISTORY OF PRESENT ILLNESS:  Stacy [name] is a 45 y.o. female with a history of anxiety/depression, sickle cell trait, hypertension, and multiple somatic complaints. She was evaluated initially in this office July 2016 with complaints of persistent heartburn, cough, dysphagia, postprandial abdominal pain, shortness of breath, and general malaise. She had had previous GI evaluations here in [place] and elsewhere. Dr. Benson Norway performed upper endoscopy with esophageal dilation 2010 (reviewed). At that time was told "continue Dexilant and add sucralfate". She also reported that she was evaluated in [place] in 2014 and underwent both colonoscopy and upper endoscopy. She could not provide the name of the practice nor the physician who performed the procedures. She did report them as normal. After her initial evaluation with our physician assistant I performed upper endoscopy 07/28/2015. Examination was normal. No cause for her GI symptoms was found or suspected. Patient tells me that she is having problems with chronic nausea chronic burning sensation in her throat and esophagus and diminished appetite with weight loss. She states that she has been trying to get into this office for months but could not. In actuality, she contacted the office exactly 2 weeks ago and was given his appointment. She tells me that her family doctor send her to a gastroenterologist in [place]. She cannot remember that doctor's name. She did state that they wanted to consider what sounds like a 24-hour pH study and possibly colonoscopy. She was not interested. Of interest, she was evaluated in the emergency room 2 days ago with abdominal pain. Her workup revealed positive pregnancy. She has tubal ligation and this was felt to be a tubal ligation for which she is receiving treatment. She is also known to have large uterine fibroids on CAT scan as recent as 11/25/2015. Again, she is being followed by gynecology regarding the  management of this issue. She tells me that she was prescribed Carafate. She states the pills are large. She wonders if she should take this. She tells me that it helped her mother. She was not aware that she had been described as previously. She also reports problems with chronic alternating bowel habits. No bleeding. During the course of her interview she appears apathetic. She requests a prescription for Zofran for nausea. She was asked about anxiety or stress. She denied is saying that life is "fantastic" she has a daughter that just enter college, recently moved, and has a new marriage...  REVIEW OF SYSTEMS:  All non-GI ROS negative except for sore throat  Past Medical History  Diagnosis Date  . Esophageal stricture   . Chest pain 09/14/2009    no pulmonary embolus by chest CT  . Palpitations   . Elevated BP   . Anxiety     past Hx  . Depression     past Hx  . GERD (gastroesophageal reflux disease)     no meds  . Sickle cell trait ([place])   . Fibroid   . Abnormal Pap smear     cryo, normal since  . Fibroids 09/24/2015  . Hypertension   . Essential hypertension 09/28/2009    Qualifier: Diagnosis of  By: [name] FNP, [place]    . Arrhythmia   . Vaginal Pap smear, abnormal     Past Surgical History  Procedure Laterality Date  . Cesarean section  2004  . Laparoscopy for ectopic pregnancy  2001  . Wisdom tooth extraction  2012    Social History Stacy [name]  reports that she has never smoked. She  has never used smokeless tobacco. She reports that she does not drink alcohol or use illicit drugs.  family history includes Asthma in her daughter, maternal grandmother, and mother; Heart Problems in her mother; Hypertension in her mother and paternal uncle; Thyroid disease in her mother. There is no history of Colon cancer, Esophageal cancer, or Stomach cancer.  Allergies  Allergen Reactions  . Hydromorphone Shortness Of Breath and Nausea And Vomiting  . Morphine  Nausea And Vomiting  . Diphenhydramine Hives       PHYSICAL EXAMINATION: Vital signs: BP 114/64 mmHg  Pulse 84  Ht 5\' 6"  (1.676 m)  Wt 188 lb 6.4 oz (85.458 kg)  BMI 30.42 kg/m2  LMP 12/08/2015 General: Well-developed, well-nourished, no acute distress HEENT: Sclerae are anicteric, conjunctiva pink. Oral mucosa intact Lungs: Clear Heart: Regular Abdomen: soft, nontender, nondistended, no obvious ascites, no peritoneal signs, normal bowel sounds. No organomegaly. Extremities: No clubbing, cyanosis or edema Psychiatric: alert and oriented x3. Cooperative   ASSESSMENT:  #1. Subjective complaints of nausea and "burning". No objective abnormalities defined. I do not believe this is a gastrointestinal disorder. Do believe that it is functional. I was candid with her regarding this impression #2. Complaints of nausea. She requests a prescription for Zofran. #3. She wished to try sucralfate #4. Alternating bowel habits without alarm features. Reports normal colonoscopy 2014 #5. Recent diagnosis of ectopic pregnancy and problems with uterine fibroids being followed by GYN   PLAN:  #1. No further GI workup indicated or planned #2. Return to her PCP regarding functional complaints #3. Okay to try sucralfate to see if this helps the burning sensation. I prescribed slurry form for her she complained about pill size #4. I was willing to prescribe Zofran 4 mg every 6 hours as needed. #30. No refills. Any future refills per PCP office if they wish #5. Recommended Citrucel 1-2 tablespoons daily for alternating bowel habits #6. Ongoing management of GYN issues with your GYN #7. Return to her PCP for all other issues.   25 minutes was spent face-to-face with the patient. Greater than 50% of the time was used for counseling regarding her multiple complaints

## 2016-01-01 ENCOUNTER — Observation Stay (HOSPITAL_BASED_OUTPATIENT_CLINIC_OR_DEPARTMENT_OTHER)
Admission: EM | Admit: 2016-01-01 | Discharge: 2016-01-05 | Disposition: A | Discharge status: Home / Self Care | Payer: BLUE CROSS/BLUE SHIELD | Attending: Cardiovascular Disease | Admitting: Cardiovascular Disease

## 2016-01-01 ENCOUNTER — Emergency Department (HOSPITAL_BASED_OUTPATIENT_CLINIC_OR_DEPARTMENT_OTHER): Payer: BLUE CROSS/BLUE SHIELD

## 2016-01-01 ENCOUNTER — Ambulatory Visit (INDEPENDENT_AMBULATORY_CARE_PROVIDER_SITE_OTHER): Payer: BLUE CROSS/BLUE SHIELD | Admitting: Obstetrics and Gynecology

## 2016-01-01 ENCOUNTER — Encounter: Payer: Self-pay | Admitting: Obstetrics and Gynecology

## 2016-01-01 ENCOUNTER — Encounter (HOSPITAL_BASED_OUTPATIENT_CLINIC_OR_DEPARTMENT_OTHER): Payer: Self-pay | Admitting: *Deleted

## 2016-01-01 DIAGNOSIS — R002 Palpitations: Secondary | ICD-10-CM | POA: Diagnosis not present

## 2016-01-01 DIAGNOSIS — D573 Sickle-cell trait: Secondary | ICD-10-CM | POA: Insufficient documentation

## 2016-01-01 DIAGNOSIS — O0281 Inappropriate change in quantitative human chorionic gonadotropin (hCG) in early pregnancy: Secondary | ICD-10-CM

## 2016-01-01 DIAGNOSIS — O009 Unspecified ectopic pregnancy without intrauterine pregnancy: Secondary | ICD-10-CM | POA: Diagnosis not present

## 2016-01-01 DIAGNOSIS — Z6828 Body mass index (BMI) 28.0-28.9, adult: Secondary | ICD-10-CM | POA: Diagnosis not present

## 2016-01-01 DIAGNOSIS — K219 Gastro-esophageal reflux disease without esophagitis: Secondary | ICD-10-CM | POA: Diagnosis not present

## 2016-01-01 DIAGNOSIS — F419 Anxiety disorder, unspecified: Secondary | ICD-10-CM | POA: Insufficient documentation

## 2016-01-01 DIAGNOSIS — O3680X0 Pregnancy with inconclusive fetal viability, not applicable or unspecified: Secondary | ICD-10-CM

## 2016-01-01 DIAGNOSIS — R0602 Shortness of breath: Secondary | ICD-10-CM | POA: Diagnosis not present

## 2016-01-01 DIAGNOSIS — I10 Essential (primary) hypertension: Secondary | ICD-10-CM | POA: Diagnosis not present

## 2016-01-01 DIAGNOSIS — Z79899 Other long term (current) drug therapy: Secondary | ICD-10-CM | POA: Diagnosis not present

## 2016-01-01 DIAGNOSIS — R072 Precordial pain: Secondary | ICD-10-CM | POA: Diagnosis not present

## 2016-01-01 DIAGNOSIS — E669 Obesity, unspecified: Secondary | ICD-10-CM | POA: Diagnosis not present

## 2016-01-01 DIAGNOSIS — R7989 Other specified abnormal findings of blood chemistry: Secondary | ICD-10-CM

## 2016-01-01 DIAGNOSIS — R778 Other specified abnormalities of plasma proteins: Secondary | ICD-10-CM

## 2016-01-01 DIAGNOSIS — I214 Non-ST elevation (NSTEMI) myocardial infarction: Secondary | ICD-10-CM

## 2016-01-01 DIAGNOSIS — R079 Chest pain, unspecified: Secondary | ICD-10-CM | POA: Diagnosis present

## 2016-01-01 DIAGNOSIS — R748 Abnormal levels of other serum enzymes: Secondary | ICD-10-CM | POA: Insufficient documentation

## 2016-01-01 LAB — CBC
HEMATOCRIT: 32.1 % — AB (ref 36.0–46.0)
HEMOGLOBIN: 11.3 g/dL — AB (ref 12.0–15.0)
MCH: 28.6 pg (ref 26.0–34.0)
MCHC: 35.2 g/dL (ref 30.0–36.0)
MCV: 81.3 fL (ref 78.0–100.0)
PLATELETS: 383 10*3/uL (ref 150–400)
RBC: 3.95 MIL/uL (ref 3.87–5.11)
RDW: 12.5 % (ref 11.5–15.5)
WBC: 6 10*3/uL (ref 4.0–10.5)

## 2016-01-01 LAB — CBC WITH DIFFERENTIAL/PLATELET
BASOS ABS: 0 10*3/uL (ref 0.0–0.1)
BASOS PCT: 0 %
EOS ABS: 0.1 10*3/uL (ref 0.0–0.7)
Eosinophils Relative: 2 %
HCT: 33.1 % — ABNORMAL LOW (ref 36.0–46.0)
HEMOGLOBIN: 11.4 g/dL — AB (ref 12.0–15.0)
Lymphocytes Relative: 29 %
Lymphs Abs: 1.3 10*3/uL (ref 0.7–4.0)
MCH: 28.1 pg (ref 26.0–34.0)
MCHC: 34.4 g/dL (ref 30.0–36.0)
MCV: 81.5 fL (ref 78.0–100.0)
Monocytes Absolute: 0.3 10*3/uL (ref 0.1–1.0)
Monocytes Relative: 6 %
NEUTROS PCT: 63 %
Neutro Abs: 2.8 10*3/uL (ref 1.7–7.7)
Platelets: 383 10*3/uL (ref 150–400)
RBC: 4.06 MIL/uL (ref 3.87–5.11)
RDW: 12.2 % (ref 11.5–15.5)
WBC: 4.5 10*3/uL (ref 4.0–10.5)

## 2016-01-01 LAB — BASIC METABOLIC PANEL
ANION GAP: 7 (ref 5–15)
BUN: 9 mg/dL (ref 6–20)
CHLORIDE: 100 mmol/L — AB (ref 101–111)
CO2: 30 mmol/L (ref 22–32)
CREATININE: 0.69 mg/dL (ref 0.44–1.00)
Calcium: 9 mg/dL (ref 8.9–10.3)
GFR calc non Af Amer: >60 mL/min (ref >60)
Glucose, Bld: 98 mg/dL (ref 65–99)
POTASSIUM: 3.7 mmol/L (ref 3.5–5.1)
SODIUM: 137 mmol/L (ref 135–145)

## 2016-01-01 LAB — HCG, QUANTITATIVE, PREGNANCY: HCG, BETA CHAIN, QUANT, S: 209 m[IU]/mL — AB (ref <5)

## 2016-01-01 LAB — CREATININE, SERUM
Creatinine, Ser: 0.71 mg/dL (ref 0.44–1.00)
GFR calc non Af Amer: >60 mL/min (ref >60)

## 2016-01-01 LAB — TROPONIN I: TROPONIN I: 0.07 ng/mL — AB (ref <0.031)

## 2016-01-01 LAB — MRSA PCR SCREENING: MRSA BY PCR: NEGATIVE

## 2016-01-01 MED ORDER — PROPRANOLOL HCL 10 MG PO TABS
10.0000 mg | ORAL_TABLET | Freq: Two times a day (BID) | ORAL | Status: DC
  Administered 2016-01-02 – 2016-01-05 (×5): 10 mg via ORAL
  Filled 2016-01-01 (×10): qty 1

## 2016-01-01 MED ORDER — TRAMADOL HCL 50 MG PO TABS
50.0000 mg | ORAL_TABLET | Freq: Four times a day (QID) | ORAL | Status: DC | PRN
  Administered 2016-01-01 – 2016-01-04 (×4): 50 mg via ORAL
  Filled 2016-01-01: qty 1
  Filled 2016-01-01: qty 2
  Filled 2016-01-01: qty 1
  Filled 2016-01-01: qty 2

## 2016-01-01 MED ORDER — HYDROCHLOROTHIAZIDE 25 MG PO TABS
12.5000 mg | ORAL_TABLET | Freq: Every day | ORAL | Status: DC
  Administered 2016-01-01: 12.5 mg via ORAL
  Filled 2016-01-01: qty 1

## 2016-01-01 MED ORDER — NITROGLYCERIN 0.4 MG SL SUBL
0.4000 mg | SUBLINGUAL_TABLET | SUBLINGUAL | Status: AC | PRN
  Administered 2016-01-01 (×3): 0.4 mg via SUBLINGUAL
  Filled 2016-01-01: qty 1

## 2016-01-01 MED ORDER — GI COCKTAIL ~~LOC~~
30.0000 mL | Freq: Three times a day (TID) | ORAL | Status: DC | PRN
  Administered 2016-01-01 – 2016-01-04 (×5): 30 mL via ORAL
  Filled 2016-01-01 (×6): qty 30

## 2016-01-01 MED ORDER — NITROGLYCERIN 0.4 MG SL SUBL
0.4000 mg | SUBLINGUAL_TABLET | SUBLINGUAL | Status: DC | PRN
  Administered 2016-01-02: 0.4 mg via SUBLINGUAL
  Filled 2016-01-01: qty 1

## 2016-01-01 MED ORDER — FAMOTIDINE 20 MG PO TABS
40.0000 mg | ORAL_TABLET | Freq: Two times a day (BID) | ORAL | Status: DC | PRN
  Administered 2016-01-01 – 2016-01-05 (×6): 40 mg via ORAL
  Filled 2016-01-01 (×7): qty 2

## 2016-01-01 MED ORDER — CLONAZEPAM 0.5 MG PO TABS
0.5000 mg | ORAL_TABLET | Freq: Every day | ORAL | Status: DC | PRN
  Administered 2016-01-01: 0.25 mg via ORAL
  Filled 2016-01-01 (×2): qty 1

## 2016-01-01 MED ORDER — ENOXAPARIN SODIUM 40 MG/0.4ML ~~LOC~~ SOLN
40.0000 mg | SUBCUTANEOUS | Status: DC
  Administered 2016-01-01 – 2016-01-03 (×3): 40 mg via SUBCUTANEOUS
  Filled 2016-01-01 (×5): qty 0.4

## 2016-01-01 MED ORDER — ONDANSETRON HCL 4 MG/2ML IJ SOLN
4.0000 mg | Freq: Four times a day (QID) | INTRAMUSCULAR | Status: DC | PRN
  Administered 2016-01-01 – 2016-01-03 (×6): 4 mg via INTRAVENOUS
  Filled 2016-01-01 (×7): qty 2

## 2016-01-01 MED ORDER — ONDANSETRON HCL 4 MG/2ML IJ SOLN: 4.0000 mg | Freq: Four times a day (QID) | INTRAMUSCULAR | Status: DC | PRN

## 2016-01-01 MED ORDER — ASPIRIN EC 81 MG PO TBEC: 81.0000 mg | DELAYED_RELEASE_TABLET | Freq: Every day | ORAL | Status: DC

## 2016-01-01 MED ORDER — NITROGLYCERIN 0.4 MG SL SUBL: 0.4000 mg | SUBLINGUAL_TABLET | SUBLINGUAL | Status: DC | PRN

## 2016-01-01 MED ORDER — ATORVASTATIN CALCIUM 40 MG PO TABS
40.0000 mg | ORAL_TABLET | Freq: Every day | ORAL | Status: DC
  Administered 2016-01-01 – 2016-01-04 (×4): 40 mg via ORAL
  Filled 2016-01-01: qty 1
  Filled 2016-01-01 (×2): qty 4
  Filled 2016-01-01 (×2): qty 1

## 2016-01-01 MED ORDER — ACETAMINOPHEN 325 MG PO TABS: 650.0000 mg | ORAL_TABLET | ORAL | Status: DC | PRN

## 2016-01-01 MED ORDER — ASPIRIN 81 MG PO CHEW
324.0000 mg | CHEWABLE_TABLET | Freq: Once | ORAL | Status: AC
  Administered 2016-01-01: 324 mg via ORAL
  Filled 2016-01-01: qty 4

## 2016-01-01 MED ORDER — ASPIRIN EC 81 MG PO TBEC
81.0000 mg | DELAYED_RELEASE_TABLET | Freq: Every day | ORAL | Status: DC
  Filled 2016-01-01: qty 1

## 2016-01-01 MED ORDER — LORAZEPAM 1 MG PO TABS
1.0000 mg | ORAL_TABLET | Freq: Once | ORAL | Status: AC
  Administered 2016-01-01: 1 mg via ORAL
  Filled 2016-01-01: qty 1

## 2016-01-01 NOTE — ED Notes (Signed)
Pt reports she was at [facility] Today for blood draw s/p ectopic pregnancy treated on Tuesday with methotrexate. Pt reports she felt SOB [facility] and began having chest pain, sharp and intermittent. Was advised to come to ED and family member brought her here. States she is under care of cardiologist for irregular heartbeat

## 2016-01-01 NOTE — Progress Notes (Signed)
Patient ID: Stacy [name], female   DOB: 11/14/1971, 45 y.o.   MRN: [id number] Ms. Stacy [name]  is a 45 y.o. [id number] at [redacted]w[redacted]d who presents to the [facility] today for follow-up quant hCG after 48 hours. The patient was seen in [facility] on 12/29/15 and had quant hCG of 163 and US showed no IUP, adnexal mass or free fluid. She has a history in ectopic pregnancy x 3 and due to inadequate rise in HCG was diagnosed with another ectopic pregnancy and treated with methotrexate. She denies pelvic pain, vaginal bleeding or fever today. She returns today for her day 4 labs. She does report some chest pain and left arm numbness. She has a history of cardiac disease.   OB History  Gravida Para Term Preterm AB SAB TAB Ectopic Multiple Living  7 2 2  0 3 0 0 3 0 2    # Outcome Date GA Lbr Len/2nd Weight Sex Delivery Anes PTL Lv  7 Current           6 Gravida           5 Ectopic              Comments: mtx  4 Ectopic              Comments: mtx  3 Ectopic              Comments: srugery- living ectopic  2 Term      Vag-Spont     1 Term      CS-LTranv        Comments: breech      Past Medical History  Diagnosis Date  . Esophageal stricture   . Chest pain 09/14/2009    no pulmonary embolus by chest CT  . Palpitations   . Elevated BP   . Anxiety     past Hx  . Depression     past Hx  . GERD (gastroesophageal reflux disease)     no meds  . Sickle cell trait ([place])   . Fibroid   . Abnormal Pap smear     cryo, normal since  . Fibroids 09/24/2015  . Hypertension   . Essential hypertension 09/28/2009    Qualifier: Diagnosis of  By: [name] FNP, [place] Hampshire    . Arrhythmia   . Vaginal Pap smear, abnormal      LMP 12/08/2015  CONSTITUTIONAL: Well-developed, well-nourished female in no acute distress.  ENT: External right and left ear normal.  EYES: EOM intact, conjunctivae normal.  MUSCULOSKELETAL: Normal range of motion.  CARDIOVASCULAR: Regular heart  rate RESPIRATORY: Normal effort NEUROLOGICAL: Alert and oriented to person, place, and time.  SKIN: Skin is warm and dry. No rash noted. Not diaphoretic. No erythema. No pallor. PSYCH: Normal mood and affect. Normal behavior. Normal judgment and thought content.  No results found for this or any previous visit (from the past 24 hour(s)).  A: Day 4 quant HCG 209  P: Patient sent to ED for evaluation of her chest pain Ectopic precautions discussed Patient will be contacted with results and return on Monday 01/04/16 for day 7 labs Patient may return to [facility] as needed or if her condition were to change or worsen   Mora Bellman, MD 01/01/2016 8:16 AM

## 2016-01-01 NOTE — H&P (Signed)
Patient ID: Stacy [name] MRN: [id number] DOB/AGE: [birth year] 45 y.o.  Admit date: 01/01/2016 Primary Physician   Benito Mccreedy, MD Primary Cardiologist   Pheonix Wisby C. Oval Linsey, MD Chief Complaint    Chest pain   HPI: Stacy [name] is a 45 y.o. female with hypertension, obesity, anxiety and depression who presents for chest pain and elevated cardiac enzymes. Stacy [name] is well-known to me.  She has been seen in the clinic and in the ED multiple times with chest pain and palpitations.  Thus far the work up has been unremarkable.  She had an echo 10/2015 with LVEF 76% and otherwise unremarkable.  Seven day event monitor did not reveal any arrhythmias.  She had a chest CT that was negative for PE and did not reveal any coronary calcifications.  She was started on propranolol for palpitations and konazepam for anxiety and reported feeling better.  This week Stacy [name] went to see her OB/Gyn for uterine fibroids.  Blood work revealed a pregnancy that she later discovered was ectopic.  She was taking methotrexate and followed up in her OB/Gyn's office today for lab work.  Upon walking into the building she felt profoundly fatigued and short of breath.  She also developed chest pressure that radiated under her left breast.  She presented to [facility], where she had an EKG that was unremarkable.  However, troponin was elevated to 0.07.  She was transferred to [facility] for evaluation.  She reports continued burning chest discomfort that she attributes to GERD.  She no longer reports sharp chest pain.  She has not noted any lower extremity edema, orthopnea or PND. Her palpitations have been better controlled, though she did note that her heart rate was increased on methotrexate.   Review of Systems: A 12 point review of systems was obtained and was negative with exceptions as noted in the HPI.  Past Medical History  Diagnosis Date  . Esophageal stricture   .  Chest pain 09/14/2009    no pulmonary embolus by chest CT  . Palpitations   . Elevated BP   . Anxiety     past Hx  . Depression     past Hx  . GERD (gastroesophageal reflux disease)     no meds  . Sickle cell trait ([place])   . Fibroid   . Abnormal Pap smear     cryo, normal since  . Fibroids 09/24/2015  . Hypertension   . Essential hypertension 09/28/2009    Qualifier: Diagnosis of  By: [name] FNP, [place] Hampshire    . Arrhythmia   . Vaginal Pap smear, abnormal     Medications Prior to Admission  Medication Sig Dispense Refill  . clonazePAM (KLONOPIN) 0.5 MG tablet Take 0.5 mg by mouth daily as needed for anxiety.    . famotidine (PEPCID) 40 MG tablet Take 40 mg by mouth 2 (two) times daily as needed for heartburn or indigestion.    . hydrochlorothiazide (HYDRODIURIL) 12.5 MG tablet Take 1 tablet (12.5 mg total) by mouth daily. (Patient taking differently: Take 12.5 mg by mouth daily before supper. ) 90 tablet 3  . propranolol (INDERAL) 20 MG tablet Take 10 mg by mouth 2 (two) times daily.    . traMADol (ULTRAM) 50 MG tablet Take 1-2 tablets (50-100 mg total) by mouth every 6 (six) hours as needed for severe pain. 15 tablet 0  . ondansetron (ZOFRAN) 4 MG tablet Take 1 tablet (4 mg total) by mouth every 6 (  six) hours as needed for nausea or vomiting. 30 tablet 0  . sucralfate (CARAFATE) 1 GM/10ML suspension Take 10 mLs (1 g total) by mouth 3 (three) times daily as needed. 420 mL 2     Allergies  Allergen Reactions  . Hydromorphone Shortness Of Breath and Nausea And Vomiting  . Morphine Nausea And Vomiting  . Diphenhydramine Hives    Social History   Social History  . Marital Status: Legally Separated    Spouse Name: N/A  . Number of Children: 2  . Years of Education: N/A   Occupational History  .  [employer]   Social History Main Topics  . Smoking status: Never Smoker   . Smokeless tobacco: Never Used  . Alcohol Use: No  . Drug Use: No  . Sexual Activity:     Partners: Male    Birth Control/ Protection: Condom   Other Topics Concern  . Not on file   Social History Narrative   Epworth Sleepiness Scale = 5 (as of 09/16/2015)    Family History  Problem Relation Age of Onset  . Asthma Mother   . Hypertension Mother   . Hypertension Paternal Uncle   . Asthma Maternal Grandmother   . Colon cancer Neg Hx   . Esophageal cancer Neg Hx   . Stomach cancer Neg Hx   . Asthma Daughter   . Thyroid disease Mother   . Heart Problems Mother     PHYSICAL EXAM: Filed Vitals:   01/01/16 1156 01/01/16 1200  BP: 100/69 102/69  Pulse:  89  Resp: 21 12   General:  Well appearing. No respiratory difficulty HEENT: normal Neck: supple. no JVD. Carotids 2+ bilat; no bruits. No lymphadenopathy or thryomegaly appreciated. Cor: PMI nondisplaced. Regular rate & rhythm. No rubs, gallops or murmurs. Lungs: clear to auscultation bilaterally Abdomen: soft, nontender, nondistended. No hepatosplenomegaly. No bruits or masses. Good bowel sounds. Extremities: no cyanosis, clubbing, rash, edema Neuro: alert & oriented x 3, cranial nerves grossly intact. moves all 4 extremities w/o difficulty. Affect pleasant.   Results for orders placed or performed during the hospital encounter of 01/01/16 (from the past 24 hour(s))  CBC with Differential     Status: Abnormal   Collection Time: 01/01/16  9:15 AM  Result Value Ref Range   WBC 4.5 4.0 - 10.5 K/uL   RBC 4.06 3.87 - 5.11 MIL/uL   Hemoglobin 11.4 (L) 12.0 - 15.0 g/dL   HCT 33.1 (L) 36.0 - 46.0 %   MCV 81.5 78.0 - 100.0 fL   MCH 28.1 26.0 - 34.0 pg   MCHC 34.4 30.0 - 36.0 g/dL   RDW 12.2 11.5 - 15.5 %   Platelets 383 150 - 400 K/uL   Neutrophils Relative % 63 %   Neutro Abs 2.8 1.7 - 7.7 K/uL   Lymphocytes Relative 29 %   Lymphs Abs 1.3 0.7 - 4.0 K/uL   Monocytes Relative 6 %   Monocytes Absolute 0.3 0.1 - 1.0 K/uL   Eosinophils Relative 2 %   Eosinophils Absolute 0.1 0.0 - 0.7 K/uL   Basophils Relative 0  %   Basophils Absolute 0.0 0.0 - 0.1 K/uL  Basic metabolic panel     Status: Abnormal   Collection Time: 01/01/16  9:15 AM  Result Value Ref Range   Sodium 137 135 - 145 mmol/L   Potassium 3.7 3.5 - 5.1 mmol/L   Chloride 100 (L) 101 - 111 mmol/L   CO2 30 22 - 32 mmol/L  Glucose, Bld 98 65 - 99 mg/dL   BUN 9 6 - 20 mg/dL   Creatinine, Ser 0.69 0.44 - 1.00 mg/dL   Calcium 9.0 8.9 - 10.3 mg/dL   GFR calc non Af Amer >60 >60 mL/min   GFR calc Af Amer >60 >60 mL/min   Anion gap 7 5 - 15  Troponin I     Status: Abnormal   Collection Time: 01/01/16  9:15 AM  Result Value Ref Range   Troponin I 0.07 (H) <0.031 ng/mL   Dg Chest 2 View  01/01/2016  CLINICAL DATA:  Chest pain and shortness of Breath EXAM: CHEST  2 VIEW COMPARISON:  12/16/2015 FINDINGS: The heart size and mediastinal contours are within normal limits. Both lungs are clear. The visualized skeletal structures are unremarkable. IMPRESSION: No active cardiopulmonary disease. Electronically Signed   By: Inez Catalina M.D.   On: 01/01/2016 10:20     ECG: Sinus rhythm.  Rate 80 bpm.  RSr'   ASSESSMENT/PLAN:  # Chest pain, elevated troponin: Stacy [name] continues to have chest discomfort that is unlike what she experienced this AM.  Given her recent normal stress test and lack of calcifications noted on chest CT, I think it is unlikely related to obstructive coronary disease.  Given her elevated troponin, this may be due to coronary spasm. Given the persistence of her symptoms, I think it would be worthwhile to proceed with cardiac catheterization to better define her coronary anatomy and look for obstructive lesions.  - Echo for wall motion abnormalities and function - Aspirin 81 mg - Propranolol.  Consider switching to verapamil if spasm is suspected - Cycle cardiac enzymes.  Heparin if troponin rises - Atorvastatin 40 mg.  Check lipid panel.  # Hypertension:  BP well-controlled.  Continue hydrochlorothiazide and propranolol.   If she has heart failure or CAD will switch HCTZ to a more cardioprotective medication.   # Palpitations: Currently denies palpitations.  Continue propranolol.  Telemetry.   Signed: Shellia Hartl C. Oval Linsey, MD, [facility]  01/01/2016, 2:19 PM

## 2016-01-01 NOTE — Progress Notes (Signed)
Loma Sousa PA  paged and made aware of patients arrival to the unit

## 2016-01-01 NOTE — ED Provider Notes (Signed)
CSN: [id number]     Arrival date & time 01/01/16  0910 History   First MD Initiated Contact with Patient 01/01/16 [phone]     Chief Complaint  Patient presents with  . Chest Pain  . Shortness of Breath    HPI   Stacy [name] is a 45 y.o. female with a PMH of HTN, GERD, anxiety, depression who presents to the ED with chest pain and shortness of breath, which she states started around 7:30 AM this morning. She reports she is currently being followed at [facility] for a recent ectopic pregnancy, and was scheduled to have a repeat hcg today. She arrived to her appointment and started to experience chest pain and shortness of breath, and was advised to come to the ED. She reports her chest pain is central and radiates to her left arm. She denies exacerbating or alleviating factors, and states she has not tried anything for symptom relief. She reports associated shortness of breath, now resolved, as well as dizziness, lightheadedness, and nausea. Per record review, appears the patient has been evaluated several times in the ED for chest pain and follows with cardiology.   Past Medical History  Diagnosis Date  . Esophageal stricture   . Chest pain 09/14/2009    no pulmonary embolus by chest CT  . Palpitations   . Elevated BP   . Anxiety     past Hx  . Depression     past Hx  . GERD (gastroesophageal reflux disease)     no meds  . Sickle cell trait ([place])   . Fibroid   . Abnormal Pap smear     cryo, normal since  . Fibroids 09/24/2015  . Hypertension   . Essential hypertension 09/28/2009    Qualifier: Diagnosis of  By: [name] FNP, [place]    . Arrhythmia   . Vaginal Pap smear, abnormal    Past Surgical History  Procedure Laterality Date  . Cesarean section  2004  . Laparoscopy for ectopic pregnancy  2001  . Wisdom tooth extraction  2012   Family History  Problem Relation Age of Onset  . Asthma Mother   . Hypertension Mother   . Hypertension Paternal Uncle   .  Asthma Maternal Grandmother   . Colon cancer Neg Hx   . Esophageal cancer Neg Hx   . Stomach cancer Neg Hx   . Asthma Daughter   . Thyroid disease Mother   . Heart Problems Mother    Social History  Substance Use Topics  . Smoking status: Never Smoker   . Smokeless tobacco: Never Used  . Alcohol Use: No   OB History    Gravida Para Term Preterm AB TAB SAB Ectopic Multiple Living   7 2 2  0 3 0 0 3 0 2      Review of Systems  Constitutional: Negative for fever and chills.  Respiratory: Positive for shortness of breath.   Cardiovascular: Positive for chest pain. Negative for leg swelling.  Gastrointestinal: Positive for nausea. Negative for vomiting.  Neurological: Positive for dizziness and light-headedness.  All other systems reviewed and are negative.     Allergies  Hydromorphone; Morphine; and Diphenhydramine  Home Medications   Prior to Admission medications   Medication Sig Start Date End Date Taking? Authorizing Provider  clonazePAM (KLONOPIN) 0.5 MG tablet Take 0.5 mg by mouth daily as needed for anxiety.   Yes Historical Provider, MD  famotidine (PEPCID) 40 MG tablet Take 40 mg by mouth  2 (two) times daily as needed for heartburn or indigestion.   Yes Historical Provider, MD  hydrochlorothiazide (HYDRODIURIL) 12.5 MG tablet Take 1 tablet (12.5 mg total) by mouth daily. 12/06/15  Yes Skeet Latch, MD  ondansetron (ZOFRAN) 4 MG tablet Take 1 tablet (4 mg total) by mouth every 6 (six) hours as needed for nausea or vomiting. 12/31/15  Yes Irene Shipper, MD  propranolol (INDERAL) 20 MG tablet Take 10 mg by mouth 2 (two) times daily.   Yes Historical Provider, MD  sucralfate (CARAFATE) 1 GM/10ML suspension Take 10 mLs (1 g total) by mouth 3 (three) times daily as needed. 12/31/15  Yes Irene Shipper, MD  traMADol (ULTRAM) 50 MG tablet Take 1-2 tablets (50-100 mg total) by mouth every 6 (six) hours as needed for severe pain. 12/29/15  Yes Manya Silvas, CNM  Vitamin D,  Ergocalciferol, (DRISDOL) 50000 UNITS CAPS capsule TAKE 1 CAPSULE BY MOUTH WEEKLY.- [facility] 03/23/10  Yes Historical Provider, MD    BP 102/69 mmHg  Pulse 89  Resp 12  SpO2 99%  LMP 12/08/2015  Breastfeeding? Unknown Physical Exam  Constitutional: She is oriented to person, place, and time. She appears well-developed and well-nourished. No distress.  HENT:  Head: Normocephalic and atraumatic.  Right Ear: External ear normal.  Left Ear: External ear normal.  Nose: Nose normal.  Mouth/Throat: Uvula is midline, oropharynx is clear and moist and mucous membranes are normal.  Eyes: Conjunctivae, EOM and lids are normal. Pupils are equal, round, and reactive to light. Right eye exhibits no discharge. Left eye exhibits no discharge. No scleral icterus.  Neck: Normal range of motion. Neck supple.  Cardiovascular: Normal rate, regular rhythm, normal heart sounds, intact distal pulses and normal pulses.   Pulmonary/Chest: Effort normal and breath sounds normal. No respiratory distress. She has no wheezes. She has no rales. She exhibits tenderness.  Anterior chest wall tender to palpation, patient states this reproduces her chest pain.  Abdominal: Soft. Normal appearance and bowel sounds are normal. She exhibits no distension and no mass. There is no tenderness. There is no rigidity, no rebound and no guarding.  Musculoskeletal: Normal range of motion. She exhibits no edema or tenderness.  Neurological: She is alert and oriented to person, place, and time. She has normal strength. No cranial nerve deficit or sensory deficit.  Skin: Skin is warm, dry and intact. No rash noted. She is not diaphoretic. No erythema. No pallor.  Psychiatric: She has a normal mood and affect. Her speech is normal and behavior is normal.  Nursing note and vitals reviewed.   ED Course  Procedures (including critical care time)  Labs Review Labs Reviewed  CBC WITH DIFFERENTIAL/PLATELET - Abnormal; Notable for the  following:    Hemoglobin 11.4 (*)    HCT 33.1 (*)    All other components within normal limits  BASIC METABOLIC PANEL - Abnormal; Notable for the following:    Chloride 100 (*)    All other components within normal limits  TROPONIN I - Abnormal; Notable for the following:    Troponin I 0.07 (*)    All other components within normal limits    Imaging Review Dg Chest 2 View  01/01/2016  CLINICAL DATA:  Chest pain and shortness of Breath EXAM: CHEST  2 VIEW COMPARISON:  12/16/2015 FINDINGS: The heart size and mediastinal contours are within normal limits. Both lungs are clear. The visualized skeletal structures are unremarkable. IMPRESSION: No active cardiopulmonary disease. Electronically Signed   By: Elta Guadeloupe  Lukens M.D.   On: 01/01/2016 10:20     I have personally reviewed and evaluated these images and lab results as part of my medical decision-making.   EKG Interpretation   Date/Time:  Friday January 01 2016 09:25:17 EST Ventricular Rate:  80 PR Interval:  148 QRS Duration: 101 QT Interval:  363 QTC Calculation: 419 R Axis:   -6 Text Interpretation:  Sinus rhythm RSR' in V1 or V2, right VCD or RVH No  significant change since last tracing Confirmed by [facility]  MD, MARTHA  [phone]) on 01/01/2016 11:14:30 AM      MDM   Final diagnoses:  Elevated troponin  Chest pain, unspecified chest pain type    45 year old female presents with chest pain and shortness of breath. Per record review, patient has been evaluated in the ED several times for chest pain. She had a negative stress test in November 2016. She was evaluated by cardiology in January, at which time her chest pain was thought to be atypical. Additionally, she was seen in the ED 1/30 for abdominal pain and was sent to [facility] and treated with methotrexate for possible ectopic pregnancy.  Patient is afebrile. Vital signs stable. Heart RRR. Lungs clear to auscultation bilaterally. Abdomen soft, non-tender,  non-distended. No lower extremity edema.  EKG sinus rhythm, HR 80. Troponin 0.07.  CBC negative for leukocytosis, hemoglobin 11.4. BMP unremarkable. hCG today at [facility]. CXR no active cardiopulmonary disease.  Patient discussed with Dr. Canary Brim. Will give ASA and NTG. Cardiology consulted. Spoke with Dr. Claiborne Billings, patient to be admitted to stepdown for further evaluation and management. Discussed plan with patient, who is requesting something for anxiety (takes medication at home). Will give ativan.  BP 102/69 mmHg  Pulse 89  Resp 12  SpO2 99%  LMP 12/08/2015  Breastfeeding? Unknown      Marella Chimes, PA-C 01/01/16 [place], MD 01/01/16 [phone]

## 2016-01-02 ENCOUNTER — Inpatient Hospital Stay (HOSPITAL_BASED_OUTPATIENT_CLINIC_OR_DEPARTMENT_OTHER): Payer: BLUE CROSS/BLUE SHIELD

## 2016-01-02 DIAGNOSIS — I214 Non-ST elevation (NSTEMI) myocardial infarction: Secondary | ICD-10-CM | POA: Diagnosis not present

## 2016-01-02 DIAGNOSIS — R072 Precordial pain: Secondary | ICD-10-CM | POA: Diagnosis not present

## 2016-01-02 DIAGNOSIS — I10 Essential (primary) hypertension: Secondary | ICD-10-CM | POA: Diagnosis not present

## 2016-01-02 DIAGNOSIS — R079 Chest pain, unspecified: Secondary | ICD-10-CM

## 2016-01-02 DIAGNOSIS — F419 Anxiety disorder, unspecified: Secondary | ICD-10-CM | POA: Diagnosis not present

## 2016-01-02 DIAGNOSIS — R748 Abnormal levels of other serum enzymes: Secondary | ICD-10-CM | POA: Diagnosis not present

## 2016-01-02 DIAGNOSIS — R002 Palpitations: Secondary | ICD-10-CM | POA: Diagnosis not present

## 2016-01-02 LAB — COMPREHENSIVE METABOLIC PANEL
ALK PHOS: 44 U/L (ref 38–126)
ALT: 13 U/L — AB (ref 14–54)
AST: 14 U/L — AB (ref 15–41)
Albumin: 3 g/dL — ABNORMAL LOW (ref 3.5–5.0)
Anion gap: 9 (ref 5–15)
BUN: 9 mg/dL (ref 6–20)
CALCIUM: 8.9 mg/dL (ref 8.9–10.3)
CHLORIDE: 99 mmol/L — AB (ref 101–111)
CO2: 28 mmol/L (ref 22–32)
CREATININE: 0.8 mg/dL (ref 0.44–1.00)
GFR calc Af Amer: >60 mL/min (ref >60)
GFR calc non Af Amer: >60 mL/min (ref >60)
GLUCOSE: 103 mg/dL — AB (ref 65–99)
Potassium: 4 mmol/L (ref 3.5–5.1)
SODIUM: 136 mmol/L (ref 135–145)
Total Bilirubin: 0.4 mg/dL (ref 0.3–1.2)
Total Protein: 6.9 g/dL (ref 6.5–8.1)

## 2016-01-02 LAB — LIPID PANEL
Cholesterol: 197 mg/dL (ref 0–200)
HDL: 57 mg/dL (ref >40)
LDL CALC: 132 mg/dL — AB (ref 0–99)
Total CHOL/HDL Ratio: 3.5 RATIO
Triglycerides: 41 mg/dL (ref <150)
VLDL: 8 mg/dL (ref 0–40)

## 2016-01-02 LAB — MAGNESIUM: Magnesium: 2.1 mg/dL (ref 1.7–2.4)

## 2016-01-02 LAB — HCG, QUANTITATIVE, PREGNANCY: HCG, BETA CHAIN, QUANT, S: 238 m[IU]/mL — AB (ref <5)

## 2016-01-02 LAB — CBC
HEMATOCRIT: 30.5 % — AB (ref 36.0–46.0)
Hemoglobin: 10.7 g/dL — ABNORMAL LOW (ref 12.0–15.0)
MCH: 28.8 pg (ref 26.0–34.0)
MCHC: 35.1 g/dL (ref 30.0–36.0)
MCV: 82 fL (ref 78.0–100.0)
PLATELETS: 333 10*3/uL (ref 150–400)
RBC: 3.72 MIL/uL — ABNORMAL LOW (ref 3.87–5.11)
RDW: 12.7 % (ref 11.5–15.5)
WBC: 4.8 10*3/uL (ref 4.0–10.5)

## 2016-01-02 MED ORDER — ENSURE ENLIVE PO LIQD
237.0000 mL | Freq: Two times a day (BID) | ORAL | Status: DC
  Administered 2016-01-02 – 2016-01-03 (×3): 237 mL via ORAL
  Filled 2016-01-02 (×4): qty 237

## 2016-01-02 MED ORDER — CLONAZEPAM 0.5 MG PO TABS
0.2500 mg | ORAL_TABLET | Freq: Every day | ORAL | Status: DC | PRN
  Administered 2016-01-02 – 2016-01-03 (×3): 0.25 mg via ORAL
  Filled 2016-01-02 (×3): qty 1

## 2016-01-02 MED ORDER — HYDROCHLOROTHIAZIDE 25 MG PO TABS
12.5000 mg | ORAL_TABLET | Freq: Every day | ORAL | Status: DC
  Administered 2016-01-02 – 2016-01-03 (×2): 12.5 mg via ORAL
  Filled 2016-01-02 (×2): qty 1

## 2016-01-02 MED ORDER — LORAZEPAM 2 MG/ML IJ SOLN
1.0000 mg | Freq: Once | INTRAMUSCULAR | Status: AC
  Administered 2016-01-02: 1 mg via INTRAVENOUS
  Filled 2016-01-02: qty 1

## 2016-01-02 NOTE — Progress Notes (Signed)
Pt. claimed she has slight vaginal bleeding. Continue to monitor.

## 2016-01-02 NOTE — Progress Notes (Addendum)
PATIENT ID:  45 y.o. female with hypertension, obesity, anxiety and depression who presents for chest pain and elevated cardiac enzymes.  INTERVAL HISTORY: Second troponin was <0.03.  SUBJECTIVE:  Feeling fatigued.  Denies chest pain or shortness of breath.  Feeling anxious but unable to get propranolol due to low BPs.    PHYSICAL EXAM Filed Vitals:   01/02/16 0417 01/02/16 0500 01/02/16 0600 01/02/16 0738  BP:  105/76 102/74 107/75  Pulse:    81  Temp: 98.2 F (36.8 C)   98.4 F (36.9 C)  TempSrc: Oral   Oral  Resp:  17 25 14   Height:      Weight:      SpO2:  100% 100% 100%   General:  Well-appearing.  NAD.  Flat affect Neck: No JVD. Lungs:  Clear to auscultation bilaterally. No crackles, wheezes, or rhonchi. Heart:  Regular rate and rhythm. No murmurs, rubs, or gallops. Normal S1/S2. Abdomen:  Soft. Nontender, nondistended. Active bowel sounds. Extremities:  Warm and well-perfused. No edema  LABS: Lab Results  Component Value Date   TROPONINI <0.03 01/01/2016   Results for orders placed or performed during the hospital encounter of 01/01/16 (from the past 24 hour(s))  CBC with Differential     Status: Abnormal   Collection Time: 01/01/16  9:15 AM  Result Value Ref Range   WBC 4.5 4.0 - 10.5 K/uL   RBC 4.06 3.87 - 5.11 MIL/uL   Hemoglobin 11.4 (L) 12.0 - 15.0 g/dL   HCT 33.1 (L) 36.0 - 46.0 %   MCV 81.5 78.0 - 100.0 fL   MCH 28.1 26.0 - 34.0 pg   MCHC 34.4 30.0 - 36.0 g/dL   RDW 12.2 11.5 - 15.5 %   Platelets 383 150 - 400 K/uL   Neutrophils Relative % 63 %   Neutro Abs 2.8 1.7 - 7.7 K/uL   Lymphocytes Relative 29 %   Lymphs Abs 1.3 0.7 - 4.0 K/uL   Monocytes Relative 6 %   Monocytes Absolute 0.3 0.1 - 1.0 K/uL   Eosinophils Relative 2 %   Eosinophils Absolute 0.1 0.0 - 0.7 K/uL   Basophils Relative 0 %   Basophils Absolute 0.0 0.0 - 0.1 K/uL  Basic metabolic panel     Status: Abnormal   Collection Time: 01/01/16  9:15 AM  Result Value Ref Range   Sodium 137 135 - 145 mmol/L   Potassium 3.7 3.5 - 5.1 mmol/L   Chloride 100 (L) 101 - 111 mmol/L   CO2 30 22 - 32 mmol/L   Glucose, Bld 98 65 - 99 mg/dL   BUN 9 6 - 20 mg/dL   Creatinine, Ser 0.69 0.44 - 1.00 mg/dL   Calcium 9.0 8.9 - 10.3 mg/dL   GFR calc non Af Amer >60 >60 mL/min   GFR calc Af Amer >60 >60 mL/min   Anion gap 7 5 - 15  Troponin I     Status: Abnormal   Collection Time: 01/01/16  9:15 AM  Result Value Ref Range   Troponin I 0.07 (H) <0.031 ng/mL  CBC     Status: Abnormal   Collection Time: 01/01/16  3:37 PM  Result Value Ref Range   WBC 6.0 4.0 - 10.5 K/uL   RBC 3.95 3.87 - 5.11 MIL/uL   Hemoglobin 11.3 (L) 12.0 - 15.0 g/dL   HCT 32.1 (L) 36.0 - 46.0 %   MCV 81.3 78.0 - 100.0 fL   MCH 28.6 26.0 - 34.0 pg  MCHC 35.2 30.0 - 36.0 g/dL   RDW 12.5 11.5 - 15.5 %   Platelets 383 150 - 400 K/uL  Creatinine, serum     Status: None   Collection Time: 01/01/16  3:37 PM  Result Value Ref Range   Creatinine, Ser 0.71 0.44 - 1.00 mg/dL   GFR calc non Af Amer >60 >60 mL/min   GFR calc Af Amer >60 >60 mL/min  Troponin I (q 6hr x 3)     Status: None   Collection Time: 01/01/16  3:37 PM  Result Value Ref Range   Troponin I <0.03 <0.031 ng/mL  MRSA PCR Screening     Status: None   Collection Time: 01/01/16  3:55 PM  Result Value Ref Range   MRSA by PCR NEGATIVE NEGATIVE  Comprehensive metabolic panel     Status: Abnormal   Collection Time: 01/02/16  2:49 AM  Result Value Ref Range   Sodium 136 135 - 145 mmol/L   Potassium 4.0 3.5 - 5.1 mmol/L   Chloride 99 (L) 101 - 111 mmol/L   CO2 28 22 - 32 mmol/L   Glucose, Bld 103 (H) 65 - 99 mg/dL   BUN 9 6 - 20 mg/dL   Creatinine, Ser 0.80 0.44 - 1.00 mg/dL   Calcium 8.9 8.9 - 10.3 mg/dL   Total Protein 6.9 6.5 - 8.1 g/dL   Albumin 3.0 (L) 3.5 - 5.0 g/dL   AST 14 (L) 15 - 41 U/L   ALT 13 (L) 14 - 54 U/L   Alkaline Phosphatase 44 38 - 126 U/L   Total Bilirubin 0.4 0.3 - 1.2 mg/dL   GFR calc non Af Amer >60 >60 mL/min    GFR calc Af Amer >60 >60 mL/min   Anion gap 9 5 - 15  Magnesium     Status: None   Collection Time: 01/02/16  2:49 AM  Result Value Ref Range   Magnesium 2.1 1.7 - 2.4 mg/dL  CBC     Status: Abnormal   Collection Time: 01/02/16  2:49 AM  Result Value Ref Range   WBC 4.8 4.0 - 10.5 K/uL   RBC 3.72 (L) 3.87 - 5.11 MIL/uL   Hemoglobin 10.7 (L) 12.0 - 15.0 g/dL   HCT 30.5 (L) 36.0 - 46.0 %   MCV 82.0 78.0 - 100.0 fL   MCH 28.8 26.0 - 34.0 pg   MCHC 35.1 30.0 - 36.0 g/dL   RDW 12.7 11.5 - 15.5 %   Platelets 333 150 - 400 K/uL  Lipid panel     Status: Abnormal   Collection Time: 01/02/16  2:49 AM  Result Value Ref Range   Cholesterol 197 0 - 200 mg/dL   Triglycerides 41 <150 mg/dL   HDL 57 >40 mg/dL   Total CHOL/HDL Ratio 3.5 RATIO   VLDL 8 0 - 40 mg/dL   LDL Cholesterol 132 (H) 0 - 99 mg/dL   No intake or output data in the 24 hours ending 01/02/16 0751  EKG:  Sinus rhythm rate 83 bpm.  Rsr' in V1. Telemetry: No events. Sinus rhythm  ASSESSMENT AND PLAN:  Active Problems:   Chest pain   NSTEMI (non-ST elevated myocardial infarction) ([place])   # Chest pain, elevated troponin: Ms. Mathes did not have any chest pain overnight. Her only complaint is fatigue. Her chest discomfort did improve with a GI cocktail yesterday. Repeat troponin was less than 0.03. It is unclear whether the initial troponin was incorrect or intentionally could've been  mildly elevated due to coronary spasms. Given the fact that she has been seen in the emergency department and in clinic multiple times with persistent chest discomfort, I think it is reasonable to pursue cardiac catheterization to better define her coronary anatomy on Monday. Given that her second troponin was less than assay we will not start heparin at this time. - Echo today for wall motion abnormalities and function - Aspirin 81 mg - Propranolol. Consider switching to verapamil if spasm is suspected - ASCVD 10 year risk is 0.6%.  Atorvastatin was started this admission due to mildly elevated troponin. If her coronaries do not have significant lesions on Monday this can b the episode is no yesterday says e discontinued.  # Hypertension: BP well-controlled.She is not receiving his atenolol because her blood pressure has been low. We will hold hydrochlorothiazide, as propranolol helps with her anxiety as well.  # Palpitations: Currently denies palpitations. Continue propranolol. Telemetry.  # Ectopic pregnancy: Ms. Lehrmann was supposed to have an hCG drawn that day she was admitted. This was for follow-up on an ectopic pregnancy that was treated with methotrexate. We will draw this lab today and transported to her obstetrician.  # GERD: Continue PPI and GI cocktail.   Desma Wilkowski C. Oval Linsey, MD, [facility] 01/02/2016 7:51 AM

## 2016-01-02 NOTE — Progress Notes (Addendum)
MD made aware about pt's refusal to take aspirin and also pt wants clonazepam at .25 mg instead of 0.5 mg

## 2016-01-02 NOTE — Progress Notes (Signed)
Complained of feeling nauseous,  Chest pain scale of 5, abdominal pain, and feeling dizzy. bp -123/91. Made comfortable  on bed. MD made aware with orders. Stat ekg done with no changes, nitro.sl given x 1 with relief, zofran iv given.continue to monitor.

## 2016-01-02 NOTE — Progress Notes (Signed)
  Echocardiogram 2D Echocardiogram has been performed.  Stacy [name] 01/02/2016, 11:57 AM

## 2016-01-02 NOTE — Progress Notes (Signed)
Initial Nutrition Assessment   INTERVENTION:  Ensure Enlive po BID, each supplement provides 350 kcal and 20 grams of protein Provided and discussed "Gastroesophageal Reflux Disease Nutrition Therapy" handout from the Academy of [employer] Diet[employer]   NUTRITION DIAGNOSIS:   Unintentional weight loss related to nausea, altered GI function as evidenced by percent weight loss.   GOAL:   Patient will meet greater than or equal to 90% of their needs   MONITOR:   PO intake, Supplement acceptance, Labs, Skin, Weight trends  REASON FOR ASSESSMENT:   Malnutrition Screening Tool    ASSESSMENT:   45 y.o. female with hypertension, obesity, anxiety and depression who presents for chest pain and elevated cardiac enzymes. This week [name]-Ruddick went to see her OB/Gyn for uterine fibroids. Blood work revealed a pregnancy that she later discovered was ectopic. She was taking methotrexate and followed up in her OB/Gyn's office today for lab work. Upon walking into the building she felt profoundly fatigued and short of breath. She also developed chest pressure that radiated under her left breast.  Weight history shows that pt has lots >7% of her body weight in the past month and 20 lbs in the past 3 months. She relates weight loss to GERD and nausea which began when she was put on multiple new medications. She reports eating smaller amounts and also reports making healthy changes to her diet. She reports eliminating fast food as well as most soda and alcohol; she also stopped eating after 7 pm. Today, she reports having a poor appetite and is interested in a protein nutritional supplement. Encouraged healthy PO intake with fruits, vegetables, whole grains, lean protein and low fat dairy.   Labs: low albumin, low hemoglobin  Diet Order:  Diet Heart Room service appropriate?: Yes; Fluid consistency:: Thin  Skin:  Reviewed, no issues  Last BM:  PTA  Height:   Ht Readings from Last 1  Encounters:  01/01/16 5\' 6"  (1.676 m)    Weight:   Wt Readings from Last 1 Encounters:  01/01/16 179 lb 3.7 oz (81.3 kg)    Ideal Body Weight:  59.1 kg  BMI:  Body mass index is 28.94 kg/(m^2).  Estimated Nutritional Needs:   Kcal:  1900-2100  Protein:  80-90 grams  Fluid:  2.1 L/day  EDUCATION NEEDS:   No education needs identified at this time  [place], LDN Inpatient Clinical Dietitian Pager: [phone] After Hours Pager: [phone]

## 2016-01-03 ENCOUNTER — Inpatient Hospital Stay (HOSPITAL_COMMUNITY): Payer: BLUE CROSS/BLUE SHIELD

## 2016-01-03 DIAGNOSIS — R7989 Other specified abnormal findings of blood chemistry: Secondary | ICD-10-CM | POA: Diagnosis not present

## 2016-01-03 DIAGNOSIS — F419 Anxiety disorder, unspecified: Secondary | ICD-10-CM | POA: Diagnosis not present

## 2016-01-03 DIAGNOSIS — R002 Palpitations: Secondary | ICD-10-CM | POA: Diagnosis not present

## 2016-01-03 DIAGNOSIS — R072 Precordial pain: Secondary | ICD-10-CM | POA: Diagnosis not present

## 2016-01-03 DIAGNOSIS — R079 Chest pain, unspecified: Secondary | ICD-10-CM | POA: Diagnosis not present

## 2016-01-03 DIAGNOSIS — R748 Abnormal levels of other serum enzymes: Secondary | ICD-10-CM | POA: Diagnosis not present

## 2016-01-03 DIAGNOSIS — I10 Essential (primary) hypertension: Secondary | ICD-10-CM | POA: Diagnosis not present

## 2016-01-03 LAB — HEPATIC FUNCTION PANEL
ALK PHOS: 46 U/L (ref 38–126)
ALT: 11 U/L — AB (ref 14–54)
AST: 16 U/L (ref 15–41)
Albumin: 3.1 g/dL — ABNORMAL LOW (ref 3.5–5.0)
BILIRUBIN TOTAL: 0.6 mg/dL (ref 0.3–1.2)
Total Protein: 6.7 g/dL (ref 6.5–8.1)

## 2016-01-03 LAB — PROTIME-INR
INR: 1.04 (ref 0.00–1.49)
PROTHROMBIN TIME: 13.8 s (ref 11.6–15.2)

## 2016-01-03 MED ORDER — SODIUM CHLORIDE 0.9 % WEIGHT BASED INFUSION
3.0000 mL/kg/h | INTRAVENOUS | Status: DC
  Administered 2016-01-04: 3 mL/kg/h via INTRAVENOUS

## 2016-01-03 MED ORDER — SODIUM CHLORIDE 0.9 % IV SOLN: 250.0000 mL | INTRAVENOUS | Status: DC | PRN

## 2016-01-03 MED ORDER — SODIUM CHLORIDE 0.9% FLUSH
3.0000 mL | Freq: Two times a day (BID) | INTRAVENOUS | Status: DC
  Administered 2016-01-03 – 2016-01-04 (×2): 3 mL via INTRAVENOUS

## 2016-01-03 MED ORDER — SODIUM CHLORIDE 0.9% FLUSH: 3.0000 mL | INTRAVENOUS | Status: DC | PRN

## 2016-01-03 MED ORDER — ASPIRIN 81 MG PO CHEW
81.0000 mg | CHEWABLE_TABLET | ORAL | Status: DC
  Filled 2016-01-03: qty 1

## 2016-01-03 MED ORDER — SODIUM CHLORIDE 0.9 % WEIGHT BASED INFUSION: 1.0000 mL/kg/h | INTRAVENOUS | Status: DC

## 2016-01-03 NOTE — Progress Notes (Signed)
PATIENT ID:  45 y.o. female with hypertension, obesity, anxiety and depression who presents for chest pain and elevated cardiac enzymes.  INTERVAL HISTORY: Abdominal pain and nausea yesterday  SUBJECTIVE:  Feeling much better.  No longer complains of abdominal pain, nausea or shortness of breath.  PHYSICAL EXAM Filed Vitals:   01/02/16 2035 01/02/16 2330 01/03/16 0320 01/03/16 0739  BP:  105/70 107/83 114/77  Pulse:      Temp: 98.4 F (36.9 C)  98.6 F (37 C) 98.6 F (37 C)  TempSrc: Oral  Oral Oral  Resp:  21 13 22   Height:      Weight:      SpO2:   98% 98%   General:  Well-appearing.  NAD.  Flat affect Neck: No JVD. Lungs:  Clear to auscultation bilaterally. No crackles, wheezes, or rhonchi. Heart:  Regular rate and rhythm. No murmurs, rubs, or gallops. Normal S1/S2. Abdomen:  Soft. Nontender, nondistended. Active bowel sounds. Extremities:  Warm and well-perfused. No edema  LABS: Lab Results  Component Value Date   TROPONINI <0.03 01/01/2016   Results for orders placed or performed during the hospital encounter of 01/01/16 (from the past 24 hour(s))  hCG, quantitative, pregnancy     Status: Abnormal   Collection Time: 01/02/16 10:30 AM  Result Value Ref Range   hCG, Beta Chain, Quant, S 238 (H) <5 mIU/mL  Hepatic function panel     Status: Abnormal   Collection Time: 01/03/16  9:24 AM  Result Value Ref Range   Total Protein 6.7 6.5 - 8.1 g/dL   Albumin 3.1 (L) 3.5 - 5.0 g/dL   AST 16 15 - 41 U/L   ALT 11 (L) 14 - 54 U/L   Alkaline Phosphatase 46 38 - 126 U/L   Total Bilirubin 0.6 0.3 - 1.2 mg/dL   Bilirubin, Direct <0.1 (L) 0.1 - 0.5 mg/dL   Indirect Bilirubin NOT CALCULATED 0.3 - 0.9 mg/dL    Intake/Output Summary (Last 24 hours) at 01/03/16 1000 Last data filed at 01/03/16 0741  Gross per 24 hour  Intake    490 ml  Output      0 ml  Net    490 ml    EKG:  Sinus rhythm rate 83 bpm.  Rsr' in V1. Telemetry: No events. Sinus rhythm  ASSESSMENT AND  PLAN:  Active Problems:   Chest pain   NSTEMI (non-ST elevated myocardial infarction) ([place])   # Chest pain, elevated troponin: Stacy [name] continues to be chest pain-free. Repeat troponin was less than 0.03. It is unclear whether the initial troponin was an error or could've been mildly elevated due to coronary spasms. Given the fact that she has been seen in the emergency department and in clinic multiple times with persistent chest discomfort, I think it is reasonable to pursue cardiac catheterization to better define her coronary anatomy tomorrow. Given that her second troponin was less than assay, heparin was not started. Echo revealed normal systolic function and no wall motion abnormalities. - Nothing by mouth after midnight for left heart cath tomorrow - Aspirin 81 mg - Will increase her propranolol, as it also helps with her anxiety. Consider switching to verapamil if spasm is suspected - ASCVD 10 year risk is 0.6%. Atorvastatin was started this admission due to mildly elevated troponin. If her coronaries do not have significant lesions on Monday this can b the episode is no yesterday says e discontinued.  # Hypertension: Hydrochlorothiazide was reduced to 6.25 mg so that  she could receive her scheduled propranolol.  # Palpitations: Currently denies palpitations. Continue propranolol. She notes that her heart rate increases to 130 with ambulation. No arrhythmias were noted on the monitor. Continue telemetry.  # Ectopic pregnancy: HCG increased yesterday. I discussed this finding with Dr. Roselie Awkward, the OB/GYN on call. He states that is normal for hCG to increase up to 7 days after taking methotrexate. He recommended that we obtain a transvaginal ultrasound which should occur today.  # GERD: Continue PPI and GI cocktail.   Seith Aikey C. Oval Linsey, MD, [facility] 01/03/2016 10:00 AM

## 2016-01-03 NOTE — Plan of Care (Signed)
Problem: Consults Goal: Chest Pain Patient Education (See Patient Education module for education specifics.)  Outcome: Completed/Met Date Met:  01/03/16 Patient given education channel information to view CP/Cath prior to Cath on 2/6.

## 2016-01-04 ENCOUNTER — Encounter (HOSPITAL_COMMUNITY)
Admission: EM | Disposition: A | Discharge status: Home / Self Care | Payer: Self-pay | Source: Home / Self Care | Attending: Emergency Medicine

## 2016-01-04 ENCOUNTER — Ambulatory Visit: Payer: BLUE CROSS/BLUE SHIELD | Admitting: Psychology

## 2016-01-04 ENCOUNTER — Ambulatory Visit: Payer: Self-pay | Admitting: Obstetrics & Gynecology

## 2016-01-04 DIAGNOSIS — R778 Other specified abnormalities of plasma proteins: Secondary | ICD-10-CM

## 2016-01-04 DIAGNOSIS — R7989 Other specified abnormal findings of blood chemistry: Secondary | ICD-10-CM

## 2016-01-04 DIAGNOSIS — R0789 Other chest pain: Secondary | ICD-10-CM | POA: Diagnosis not present

## 2016-01-04 HISTORY — PX: CARDIAC CATHETERIZATION: SHX172

## 2016-01-04 SURGERY — LEFT HEART CATH AND CORONARY ANGIOGRAPHY
Anesthesia: LOCAL

## 2016-01-04 MED ORDER — LIDOCAINE HCL (PF) 1 % IJ SOLN
INTRAMUSCULAR | Status: DC | PRN
  Administered 2016-01-04: 5 mL

## 2016-01-04 MED ORDER — MIDAZOLAM HCL 2 MG/2ML IJ SOLN
INTRAMUSCULAR | Status: AC
  Filled 2016-01-04: qty 2

## 2016-01-04 MED ORDER — ONDANSETRON HCL 4 MG/2ML IJ SOLN
INTRAMUSCULAR | Status: AC
  Filled 2016-01-04: qty 2

## 2016-01-04 MED ORDER — FENTANYL CITRATE (PF) 100 MCG/2ML IJ SOLN
INTRAMUSCULAR | Status: AC
  Filled 2016-01-04: qty 2

## 2016-01-04 MED ORDER — HEPARIN (PORCINE) IN NACL 2-0.9 UNIT/ML-% IJ SOLN
INTRAMUSCULAR | Status: AC
  Filled 2016-01-04: qty 1000

## 2016-01-04 MED ORDER — HEPARIN (PORCINE) IN NACL 2-0.9 UNIT/ML-% IJ SOLN
INTRAMUSCULAR | Status: DC | PRN
  Administered 2016-01-04: 1000 mL

## 2016-01-04 MED ORDER — HEPARIN SODIUM (PORCINE) 1000 UNIT/ML IJ SOLN
INTRAMUSCULAR | Status: DC | PRN
  Administered 2016-01-04: 4000 [IU] via INTRAVENOUS

## 2016-01-04 MED ORDER — SODIUM CHLORIDE 0.9% FLUSH: 3.0000 mL | INTRAVENOUS | Status: DC | PRN

## 2016-01-04 MED ORDER — FENTANYL CITRATE (PF) 100 MCG/2ML IJ SOLN
INTRAMUSCULAR | Status: DC | PRN
  Administered 2016-01-04 (×3): 50 ug via INTRAVENOUS

## 2016-01-04 MED ORDER — SODIUM CHLORIDE 0.9% FLUSH: 3.0000 mL | Freq: Two times a day (BID) | INTRAVENOUS | Status: DC

## 2016-01-04 MED ORDER — VERAPAMIL HCL 2.5 MG/ML IV SOLN
INTRAVENOUS | Status: DC | PRN
  Administered 2016-01-04: 10 mL via INTRA_ARTERIAL

## 2016-01-04 MED ORDER — SODIUM CHLORIDE 0.9 % IV SOLN
250.0000 mL | INTRAVENOUS | Status: DC | PRN
Start: 2016-01-04 — End: 2016-01-05

## 2016-01-04 MED ORDER — SODIUM CHLORIDE 0.9 % IV BOLUS (SEPSIS)
1000.0000 mL | Freq: Once | INTRAVENOUS | Status: AC
  Administered 2016-01-04: 23:00:00 1000 mL via INTRAVENOUS

## 2016-01-04 MED ORDER — HEPARIN SODIUM (PORCINE) 1000 UNIT/ML IJ SOLN
INTRAMUSCULAR | Status: AC
  Filled 2016-01-04: qty 1

## 2016-01-04 MED ORDER — VERAPAMIL HCL 2.5 MG/ML IV SOLN
INTRAVENOUS | Status: AC
  Filled 2016-01-04: qty 2

## 2016-01-04 MED ORDER — LIDOCAINE HCL (PF) 1 % IJ SOLN
INTRAMUSCULAR | Status: AC
  Filled 2016-01-04: qty 30

## 2016-01-04 MED ORDER — SODIUM CHLORIDE 0.9 % WEIGHT BASED INFUSION: 3.0000 mL/kg/h | INTRAVENOUS | Status: AC

## 2016-01-04 MED ORDER — MIDAZOLAM HCL 2 MG/2ML IJ SOLN
INTRAMUSCULAR | Status: DC | PRN
  Administered 2016-01-04 (×3): 1 mg via INTRAVENOUS

## 2016-01-04 MED ORDER — ONDANSETRON HCL 4 MG/2ML IJ SOLN: 4.0000 mg | Freq: Four times a day (QID) | INTRAMUSCULAR | Status: DC | PRN

## 2016-01-04 MED ORDER — ACETAMINOPHEN 325 MG PO TABS: 650.0000 mg | ORAL_TABLET | ORAL | Status: DC | PRN

## 2016-01-04 SURGICAL SUPPLY — 10 items
CATH INFINITI 5 FR JL3.5 (CATHETERS) ×2 IMPLANT
CATH INFINITI JR4 5F (CATHETERS) ×2 IMPLANT
DEVICE RAD COMP TR BAND LRG (VASCULAR PRODUCTS) ×2 IMPLANT
GLIDESHEATH SLEND A-KIT 6F 22G (SHEATH) ×2 IMPLANT
KIT HEART LEFT (KITS) ×2 IMPLANT
PACK CARDIAC CATHETERIZATION (CUSTOM PROCEDURE TRAY) ×2 IMPLANT
TRANSDUCER W/STOPCOCK (MISCELLANEOUS) ×2 IMPLANT
TUBING CIL FLEX 10 FLL-RA (TUBING) ×2 IMPLANT
WIRE HI TORQ VERSACORE-J 145CM (WIRE) ×2 IMPLANT
WIRE SAFE-T 1.5MM-J .035X260CM (WIRE) ×2 IMPLANT

## 2016-01-04 NOTE — Progress Notes (Signed)
Patient ID: HARLEQUIN COLAR, female   DOB: 1970/12/05, 45 y.o.   MRN: [id number]  Patient is for cath today.  Daryel November, MD

## 2016-01-04 NOTE — Progress Notes (Signed)
I have spoken with Dr. Oval Linsey about the patient's vaginal ultrasound. There is no indication at this time of an active pregnancy. There is no contraindication to cardiac catheterization on the basis of potential pregnancy. I've communicated this information to Dr. Pernell Dupre.  Daryel November, MD

## 2016-01-04 NOTE — H&P (View-Only) (Signed)
I have spoken with Dr. Oval Linsey about the patient's vaginal ultrasound. There is no indication at this time of an active pregnancy. There is no contraindication to cardiac catheterization on the basis of potential pregnancy. I've communicated this information to Dr. Pernell Dupre.  Daryel November, MD

## 2016-01-04 NOTE — Progress Notes (Signed)
Per pt's request will wait till am to do groin and right radial prep for cath.  Will continue to monitor. Saunders Revel T

## 2016-01-04 NOTE — Interval H&P Note (Signed)
Cath Lab Visit (complete for each Cath Lab visit)  Clinical Evaluation Leading to the Procedure:   ACS: No.  Non-ACS:    Anginal Classification: CCS III  Anti-ischemic medical therapy: Minimal Therapy (1 class of medications)  Non-Invasive Test Results: No non-invasive testing performed  Prior CABG: No previous CABG      History and Physical Interval Note:  01/04/2016 4:03 PM  Stacy [name]  has presented today for surgery, with the diagnosis of elevated troponin, chest pain  The various methods of treatment have been discussed with the patient and family. After consideration of risks, benefits and other options for treatment, the patient has consented to  Procedure(s): Left Heart Cath and Coronary Angiography (N/A) as a surgical intervention .  The patient's history has been reviewed, patient examined, no change in status, stable for surgery.  I have reviewed the patient's chart and labs.  Questions were answered to the patient's satisfaction.     Stacy [name]

## 2016-01-04 NOTE — Progress Notes (Signed)
CARDIAC REHAB PHASE I   Reviewed chart, awaiting cath results to determine appropriate education/ambulation. Will follow.  Lenna Sciara, RN, BSN 01/04/2016 10:33 AM

## 2016-01-05 ENCOUNTER — Encounter (HOSPITAL_COMMUNITY): Payer: Self-pay | Admitting: Interventional Cardiology

## 2016-01-05 DIAGNOSIS — R079 Chest pain, unspecified: Secondary | ICD-10-CM

## 2016-01-05 DIAGNOSIS — R7989 Other specified abnormal findings of blood chemistry: Secondary | ICD-10-CM | POA: Diagnosis not present

## 2016-01-05 LAB — HCG, QUANTITATIVE, PREGNANCY: HCG, BETA CHAIN, QUANT, S: 99 m[IU]/mL — AB (ref <5)

## 2016-01-05 MED ORDER — ATORVASTATIN CALCIUM 40 MG PO TABS: 40.0000 mg | ORAL_TABLET | Freq: Every day | ORAL | Status: DC

## 2016-01-05 NOTE — Discharge Instructions (Signed)
No driving for 24 hours. No lifting over 5 lbs for 1 week. No sexual activity for 1 week. Keep procedure site clean & dry. If you notice increased pain, swelling, bleeding or pus, call/return!  You may shower, but no soaking baths/hot tubs/pools for 1 week.  ° ° °

## 2016-01-05 NOTE — Progress Notes (Signed)
Patient Name: STEPHANA HADAD Date of Encounter: 01/05/2016     Principal Problem:   Elevated troponin Active Problems:   PALPITATIONS, RECURRENT   Essential hypertension   Chest pain    SUBJECTIVE  Denies any discomfort, had a little chest discomfort last night, thought it was GERD  CURRENT MEDS . atorvastatin  40 mg Oral q1800  . enoxaparin (LOVENOX) injection  40 mg Subcutaneous Q24H  . feeding supplement (ENSURE ENLIVE)  237 mL Oral BID BM  . hydrochlorothiazide  12.5 mg Oral QAC supper  . propranolol  10 mg Oral BID  . sodium chloride flush  3 mL Intravenous Q12H    OBJECTIVE  Filed Vitals:   01/04/16 2100 01/04/16 2227 01/05/16 0342 01/05/16 0749  BP: 88/50 85/48 113/69 108/63  Pulse: 77  74 89  Temp:   97.9 F (36.6 C) 98 F (36.7 C)  TempSrc:   Oral Oral  Resp: 19 17 17 13   Height:      Weight:   181 lb 3.5 oz (82.2 kg)   SpO2: 99%  98% 95%    Intake/Output Summary (Last 24 hours) at 01/05/16 0755 Last data filed at 01/04/16 1900  Gross per 24 hour  Intake 1321.65 ml  Output      0 ml  Net 1321.65 ml   Filed Weights   01/01/16 1600 01/05/16 0342  Weight: 179 lb 3.7 oz (81.3 kg) 181 lb 3.5 oz (82.2 kg)    PHYSICAL EXAM  General: Pleasant, NAD. Neuro: Alert and oriented X 3. Moves all extremities spontaneously. Psych: Normal affect. HEENT:  Normal  Neck: Supple without bruits or JVD. Lungs:  Resp regular and unlabored, CTA. Heart: RRR no s3, s4, or murmurs. R radial cath site stable, no significant bleeding or hematoma Abdomen: Soft, non-tender, non-distended, BS + x 4.  Extremities: No clubbing, cyanosis or edema. DP/PT/Radials 2+ and equal bilaterally.  Accessory Clinical Findings  CBC No results for input(s): WBC, NEUTROABS, HGB, HCT, MCV, PLT in the last 72 hours. Basic Metabolic Panel No results for input(s): NA, K, CL, CO2, GLUCOSE, BUN, CREATININE, CALCIUM, MG, PHOS in the last 72 hours. Liver Function Tests  Recent  Labs  01/03/16 0924  AST 16  ALT 11*  ALKPHOS 46  BILITOT 0.6  PROT 6.7  ALBUMIN 3.1*    TELE NSR without significant ventricular ectopy    ECG  No new EKG  Echocardiogram 01/02/2016  LV EF: 60% -  65%  ------------------------------------------------------------------- Indications:   Chest pain 786.51.  ------------------------------------------------------------------- History:  PMH: Palpitaions. Dyspnea. PMH:  Myocardial infarction. Risk factors: Hypertension.  ------------------------------------------------------------------- Study Conclusions  - Left ventricle: The cavity size was normal. Wall thickness was increased in a pattern of mild LVH. Systolic function was normal. The estimated ejection fraction was in the range of 60% to 65%. Wall motion was normal; there were no regional wall motion abnormalities. Doppler parameters are consistent with abnormal left ventricular relaxation (grade 1 diastolic dysfunction). - Mitral valve: There was trivial regurgitation. - Right atrium: Central venous pressure (est): 3 mm Hg. - Tricuspid valve: There was trivial regurgitation. - Pulmonary arteries: PA peak pressure: 10 mm Hg (S). - Pericardium, extracardiac: A trivial pericardial effusion was identified posterior to the heart.  Impressions:  - Mild LVH with LVEF 60-65% and probable grade 1 diastolic dysfunction with normal LV filling pressure. Trivial mitral and tricuspid regurgitation. Possible trivial posterior pericardial effusion.     Radiology/Studies  Dg Chest 2 View  01/01/2016  CLINICAL DATA:  Chest pain and shortness of Breath EXAM: CHEST  2 VIEW COMPARISON:  12/16/2015 FINDINGS: The heart size and mediastinal contours are within normal limits. Both lungs are clear. The visualized skeletal structures are unremarkable. IMPRESSION: No active cardiopulmonary disease. Electronically Signed   By: Inez Catalina M.D.   On: 01/01/2016  10:20   Dg Chest 2 View  12/16/2015  CLINICAL DATA:  Left-sided chest pain EXAM: CHEST  2 VIEW COMPARISON:  December 08, 2015 FINDINGS: Lungs are clear. Heart size and pulmonary vascularity are normal. No adenopathy. No bone lesions. No pneumothorax. IMPRESSION: No edema or consolidation. Electronically Signed   By: Lowella Grip III M.D.   On: 12/16/2015 08:30   Dg Chest 2 View  12/08/2015  CLINICAL DATA:  Chest pain. EXAM: CHEST  2 VIEW COMPARISON:  11/27/2015. FINDINGS: Mediastinum and hilar structures normal. Lungs are clear. No pleural effusion or pneumothorax. Heart size normal. IMPRESSION: No acute cardiopulmonary disease. Electronically Signed   By: Marcello Moores  Register   On: 12/08/2015 14:27   US Ob Comp Less 14 Wks  01/03/2016  CLINICAL DATA:  Follow-up evaluation. Patient administered methotrexate. EXAM: OBSTETRIC <14 WK [place] AND TRANSVAGINAL OB US TECHNIQUE: Both transabdominal and transvaginal ultrasound examinations were performed for complete evaluation of the gestation as well as the maternal uterus, adnexal regions, and pelvic cul-de-sac. Transvaginal technique was performed to assess early pregnancy. COMPARISON:  Pelvic ultrasound 12/29/2015 FINDINGS: No intrauterine gestation is identified. The right and left ovaries are unremarkable. No adnexal masses are identified. The uterus measures 9.4 x 4.0 x 4.4 cm. Re- demonstrated multiple uterine fibroids measuring 5.3 cm within the anterior midline and 4.7 cm within the left aspect of the uterine body. Endometrium measures 5 mm. IMPRESSION: No intrauterine gestation identified. In the setting of positive pregnancy test and no definite intrauterine pregnancy, this reflects a pregnancy of unknown location. Differential considerations include early normal IUP, abnormal IUP, or nonvisualized ectopic pregnancy. Differentiation is achieved with serial beta HCG supplemented by repeat sonography as clinically warranted. Fibroid uterus. No adnexal masses  identified. Electronically Signed   By: Lovey Newcomer M.D.   On: 01/03/2016 12:28   US Ob Comp Less 14 Wks  12/29/2015  CLINICAL DATA:  45 year old pregnant female with a history of ectopic pregnancy x 3 presents with 2 days of mid abdominal pain. Quantitative beta HCG of 148 on 12/28/2015. Repeat quantitative beta HCG level pending. EDC by LMP: 09/13/2016, projecting to an expected gestational age of [redacted] weeks 0 days EXAM: OBSTETRIC <14 WK [place] AND TRANSVAGINAL OB US TECHNIQUE: Both transabdominal and transvaginal ultrasound examinations were performed for complete evaluation of the gestation as well as the maternal uterus, adnexal regions, and pelvic cul-de-sac. Transvaginal technique was performed to assess early pregnancy. COMPARISON:  12/28/2015 obstetric scan. FINDINGS: The uterus is retroverted and retroflexed, measuring 10.3 x 7.1 x 6.9 cm in size. The uterus is enlarged by uterine fibroids as follows: - left anterior uterine body subserosal 4.5 x 4.4 x 3.6 cm fibroid - right fundal intramural 4.8 x 4.5 x 4.9 cm fibroid Endometrial visualization is limited by distortion by the surrounding fibroids. No intrauterine gestational sac is demonstrated. No endometrial cavity fluid or focal endometrial mass is demonstrated. The left ovary measures 1.9 x 0.8 x 1.3 cm. The right ovary is not visualized. No suspicious ovarian or adnexal masses are demonstrated. No abnormal free fluid is seen in the pelvis. IMPRESSION: 1. Non localization of the pregnancy by ultrasound. The sonographic differential diagnosis  remains an intrauterine gestation too early to visualize, a spontaneous abortion or an occult ectopic gestation. Continued close clinical follow-up and serial serum beta HCG monitoring are advised. Follow-up obstetric sonography as clinically warranted. 2. Enlarged myomatous uterus. 3. Normal left ovary. Nonvisualization of the right ovary. No suspicious adnexal findings. No abnormal free fluid in the pelvis.  Electronically Signed   By: Ilona Sorrel M.D.   On: 12/29/2015 11:22   US Ob Comp Less 14 Wks  12/28/2015  CLINICAL DATA:  Abnormal bleeding in pregnancy. Quantitative beta HCG is 191.6. LMP is01/08/2016. Gestational age by LMP is2 weeks 6 days. EDC by LMP is 09/13/2016. EXAM: OBSTETRIC <14 WK [place] AND TRANSVAGINAL OB US TECHNIQUE: Both transabdominal and transvaginal ultrasound examinations were performed for complete evaluation of the gestation as well as the maternal uterus, adnexal regions, and pelvic cul-de-sac. Transvaginal technique was performed to assess early pregnancy. COMPARISON:  CT of the abdomen and pelvis on 11/25/2015 FINDINGS: Intrauterine gestational sac: Not seen Yolk sac:  Not seen Embryo:  Not seen Cardiac Activity: Absent Subchorionic hemorrhage:  None visualized. Maternal uterus/adnexae: Endometrial stripe is thin and homogeneous. Fundal fibroid is 6.0 x 4.6 x 5.0 cm. Left fundal fibroid is 4.5 x 4.2 x 3.7 cm. Small amount of fluid is identified in the right adnexal region. IMPRESSION: 1. Uterine fibroids present. 2. No intrauterine or adnexal pregnancy identified. Serial quantitative beta HCG values and follow-up ultrasound are recommended as appropriate to document progression of and location of pregnancy. Ectopic pregnancy has not been excluded. Electronically Signed   By: Nolon Nations M.D.   On: 12/28/2015 08:59   US Ob Transvaginal  01/03/2016  CLINICAL DATA:  Follow-up evaluation. Patient administered methotrexate. EXAM: OBSTETRIC <14 WK [place] AND TRANSVAGINAL OB US TECHNIQUE: Both transabdominal and transvaginal ultrasound examinations were performed for complete evaluation of the gestation as well as the maternal uterus, adnexal regions, and pelvic cul-de-sac. Transvaginal technique was performed to assess early pregnancy. COMPARISON:  Pelvic ultrasound 12/29/2015 FINDINGS: No intrauterine gestation is identified. The right and left ovaries are unremarkable. No adnexal masses are  identified. The uterus measures 9.4 x 4.0 x 4.4 cm. Re- demonstrated multiple uterine fibroids measuring 5.3 cm within the anterior midline and 4.7 cm within the left aspect of the uterine body. Endometrium measures 5 mm. IMPRESSION: No intrauterine gestation identified. In the setting of positive pregnancy test and no definite intrauterine pregnancy, this reflects a pregnancy of unknown location. Differential considerations include early normal IUP, abnormal IUP, or nonvisualized ectopic pregnancy. Differentiation is achieved with serial beta HCG supplemented by repeat sonography as clinically warranted. Fibroid uterus. No adnexal masses identified. Electronically Signed   By: Lovey Newcomer M.D.   On: 01/03/2016 12:28   US Ob Transvaginal  12/29/2015  CLINICAL DATA:  45 year old pregnant female with a history of ectopic pregnancy x 3 presents with 2 days of mid abdominal pain. Quantitative beta HCG of 148 on 12/28/2015. Repeat quantitative beta HCG level pending. EDC by LMP: 09/13/2016, projecting to an expected gestational age of [redacted] weeks 0 days EXAM: OBSTETRIC <14 WK [place] AND TRANSVAGINAL OB US TECHNIQUE: Both transabdominal and transvaginal ultrasound examinations were performed for complete evaluation of the gestation as well as the maternal uterus, adnexal regions, and pelvic cul-de-sac. Transvaginal technique was performed to assess early pregnancy. COMPARISON:  12/28/2015 obstetric scan. FINDINGS: The uterus is retroverted and retroflexed, measuring 10.3 x 7.1 x 6.9 cm in size. The uterus is enlarged by uterine fibroids as follows: - left anterior  uterine body subserosal 4.5 x 4.4 x 3.6 cm fibroid - right fundal intramural 4.8 x 4.5 x 4.9 cm fibroid Endometrial visualization is limited by distortion by the surrounding fibroids. No intrauterine gestational sac is demonstrated. No endometrial cavity fluid or focal endometrial mass is demonstrated. The left ovary measures 1.9 x 0.8 x 1.3 cm. The right ovary is  not visualized. No suspicious ovarian or adnexal masses are demonstrated. No abnormal free fluid is seen in the pelvis. IMPRESSION: 1. Non localization of the pregnancy by ultrasound. The sonographic differential diagnosis remains an intrauterine gestation too early to visualize, a spontaneous abortion or an occult ectopic gestation. Continued close clinical follow-up and serial serum beta HCG monitoring are advised. Follow-up obstetric sonography as clinically warranted. 2. Enlarged myomatous uterus. 3. Normal left ovary. Nonvisualization of the right ovary. No suspicious adnexal findings. No abnormal free fluid in the pelvis. Electronically Signed   By: Ilona Sorrel M.D.   On: 12/29/2015 11:22   US Ob Transvaginal  12/28/2015  CLINICAL DATA:  Abnormal bleeding in pregnancy. Quantitative beta HCG is 191.6. LMP is01/08/2016. Gestational age by LMP is2 weeks 6 days. EDC by LMP is 09/13/2016. EXAM: OBSTETRIC <14 WK [place] AND TRANSVAGINAL OB US TECHNIQUE: Both transabdominal and transvaginal ultrasound examinations were performed for complete evaluation of the gestation as well as the maternal uterus, adnexal regions, and pelvic cul-de-sac. Transvaginal technique was performed to assess early pregnancy. COMPARISON:  CT of the abdomen and pelvis on 11/25/2015 FINDINGS: Intrauterine gestational sac: Not seen Yolk sac:  Not seen Embryo:  Not seen Cardiac Activity: Absent Subchorionic hemorrhage:  None visualized. Maternal uterus/adnexae: Endometrial stripe is thin and homogeneous. Fundal fibroid is 6.0 x 4.6 x 5.0 cm. Left fundal fibroid is 4.5 x 4.2 x 3.7 cm. Small amount of fluid is identified in the right adnexal region. IMPRESSION: 1. Uterine fibroids present. 2. No intrauterine or adnexal pregnancy identified. Serial quantitative beta HCG values and follow-up ultrasound are recommended as appropriate to document progression of and location of pregnancy. Ectopic pregnancy has not been excluded. Electronically Signed    By: Nolon Nations M.D.   On: 12/28/2015 08:59    ASSESSMENT AND PLAN  45 yo female who likely has ectopic pregnancy who took methotrexate came in with chest pain and elevated trop. 2nd trop was negative. ?coronary spasm vs error. Underwent cath to r/o blockages, coronary vessel normal.   1. Chest pain with elevated trop  - underwent cardiac cath on 01/03/2016 clean coronaries, normal EF. ?initial elevated trop was coronary spasm vs lab error, trop 0.07 --> <0.03  - echo 01/02/2016 EF [id number], grade 1 diastolic dysfunction.  - likely discharge today after HCG lab result to have close outpatient followup with Ob/GYN, followup with cardiology only as needed.   2. HTN  3. Ectopic prenancy: Dr. Oval Linsey discussed with Dr. Roselie Awkward, the OB/GYN on call. He states that is normal for hCG to increase up to 7 days after taking methotrexate. He recommended that we obtain a transvaginal ultrasound. Per Dr. Kae Heller note yesterday, discussed with Dr. Oval Linsey, no indication at this time of an active pregnancy  - based on Ob/GYN note on 01/01/2016, patient should return to [facility] on Monday for day 7 labs. I will obtian repeat HCG today  4. Palpitation  5. Anxiety  Signed, Almyra Deforest PA-C Pager: [phone]   S/P radial cath with Nl cors and LV. Non cardiac CP. OK for DC home this AM. ROV with Dr. Oval Linsey. Pursue non cardiac causes of  CP by PCP ([facility]).

## 2016-01-05 NOTE — Progress Notes (Signed)
D/c instructions reviewed with pt, copy of instructions given to pt. Pt aware script sent in to her pharmacy. Pt d/c'd via wheelchair with belongings escorted by hospital volunteer, pt friend went to get car and meet at entrance.

## 2016-01-05 NOTE — Care Management Note (Signed)
Case Management Note  Patient Details  Name: Stacy [name] MRN: [id number] Date of Birth: 09/20/1971  Subjective/Objective:   Patient is from home, pta indep.  Patiient is on ASA.  No needs.                 Action/Plan:   Expected Discharge Date:                  Expected Discharge Plan:  Home/Self Care  In-House Referral:     Discharge planning Services  CM Consult  Post Acute Care Choice:    Choice offered to:     DME Arranged:    DME Agency:     HH Arranged:    [place] Agency:     Status of Service:  Completed, signed off  Medicare Important Message Given:    Date Medicare IM Given:    Medicare IM give by:    Date Additional Medicare IM Given:    Additional Medicare Important Message give by:     If discussed at [place] of Stay Meetings, dates discussed:    Additional Comments:  Zenon Mayo, RN 01/05/2016, 12:47 PM

## 2016-01-05 NOTE — Discharge Summary (Signed)
Discharge Summary    Patient ID: Stacy [name],  MRN: [id number], DOB/AGE: 1971/01/23 45 y.o.  Admit date: 01/01/2016 Discharge date: 01/05/2016  Primary Care Provider: OSEI-BONSU,GEORGE Primary Cardiologist: Dr. Oval Linsey  Discharge Diagnoses    Principal Problem:   Elevated troponin Active Problems:   PALPITATIONS, RECURRENT   Essential hypertension   Chest pain   Allergies Allergies  Allergen Reactions  . Hydromorphone Shortness Of Breath and Nausea And Vomiting  . Morphine Nausea And Vomiting  . Diphenhydramine Hives    Diagnostic Studies/Procedures    Echocardiogram 01/02/2016 LV EF: 60% -  65%  ------------------------------------------------------------------- Indications:   Chest pain 786.51.  ------------------------------------------------------------------- History:  PMH: Palpitaions. Dyspnea. PMH:  Myocardial infarction. Risk factors: Hypertension.  ------------------------------------------------------------------- Study Conclusions  - Left ventricle: The cavity size was normal. Wall thickness was increased in a pattern of mild LVH. Systolic function was normal. The estimated ejection fraction was in the range of 60% to 65%. Wall motion was normal; there were no regional wall motion abnormalities. Doppler parameters are consistent with abnormal left ventricular relaxation (grade 1 diastolic dysfunction). - Mitral valve: There was trivial regurgitation. - Right atrium: Central venous pressure (est): 3 mm Hg. - Tricuspid valve: There was trivial regurgitation. - Pulmonary arteries: PA peak pressure: 10 mm Hg (S). - Pericardium, extracardiac: A trivial pericardial effusion was identified posterior to the heart.  Impressions:  - Mild LVH with LVEF 60-65% and probable grade 1 diastolic dysfunction with normal LV filling pressure. Trivial mitral and tricuspid regurgitation. Possible trivial posterior  pericardial effusion.  Vaginal U/S 01/03/2016 IMPRESSION: No intrauterine gestation identified. In the setting of positive pregnancy test and no definite intrauterine pregnancy, this reflects a pregnancy of unknown location. Differential considerations include early normal IUP, abnormal IUP, or nonvisualized ectopic pregnancy. Differentiation is achieved with serial beta HCG supplemented by repeat sonography as clinically warranted.  Fibroid uterus.  No adnexal masses identified.  Cardiac catheterization 01/04/2016  Conclusion     Normal coronary arteries  Normal left ventricular function and hemodynamics  Chest pain is non-ischemic.   Recommendations:   Consider other sources/etiologies to explain presentation.    ____________   History of Present Illness     Stacy [name] is a 45 y.o. female with hypertension, obesity, anxiety and depression who presents for chest pain and elevated cardiac enzymes. Stacy [name] is well-known to me. She has been seen in the clinic and in the ED multiple times with chest pain and palpitations. Thus far the work up has been unremarkable. She had an echo 10/2015 with LVEF 76% and otherwise unremarkable. Seven day event monitor did not reveal any arrhythmias. She had a chest CT that was negative for PE and did not reveal any coronary calcifications. She was started on propranolol for palpitations and konazepam for anxiety and reported feeling better.  This week Stacy [name] went to see her OB/Gyn for uterine fibroids. Blood work revealed a pregnancy that she later discovered was ectopic. She was taking methotrexate and followed up in her OB/Gyn's office today for lab work. Upon walking into the building she felt profoundly fatigued and short of breath. She also developed chest pressure that radiated under her left breast. She presented to [place], where she had an EKG that was unremarkable. However, troponin  was elevated to 0.07. She was transferred to [facility] for evaluation. She reports continued burning chest discomfort that she attributes to GERD. She no longer reports sharp chest pain. She has not  noted any lower extremity edema, orthopnea or PND. Her palpitations have been better controlled, though she did note that her heart rate was increased on methotrexate.  Hospital Course      Patient was admitted to cardiology service on 01/01/2016. Echocardiogram obtained on 01/02/2016 showed EF 60-65%, no regional wall motion abnormality, grade 1 diastolic dysfunction, peak PA pressure 10 mmHg, trivial pericardial effusion noted posterior to the heart. Her initial troponin was elevated at 0.07, however subsequent troponin was negative. Her quantitative hCG continue to trended up to 238. After discussing with GYN, this appears to be normal and can occur within 7 days after taking methotrexate. Lipid panel obtained on 2/4 showed cholesterol 197, triglyceride 41, HDL 57, LDL 132. She was started on 40 mg Lipitor. Vaginal ultrasound obtained on 2/5 showed no intrauterine gestation identified, in the setting of positive pregnancy test and no definitive intrauterine pregnancy, this reflect a pregnancy of unknown location. This consistent with her history of possible ectopic pregnancy.  She underwent cardiac catheterization on 01/04/2016 which showed normal coronaries and a normal EF 55-65%. Her chest pain is likely noncardiac in origin. As for elevated troponin, there was question whether it reflect coronary spasm versus lab error.   Patient was seen in the morning of 01/05/2016, her right radial cath site appears to be stable without no bleeding or hematoma. She is deemed stable for discharge from cardiology perspective. Outpatient GYN and cardiology follow-up has been arranged.  _____________  Discharge Vitals Blood pressure 109/60, pulse 82, temperature 97.1 F (36.2 C), temperature source Oral, resp. rate 11,  height 5\' 6"  (1.676 m), weight 181 lb 3.5 oz (82.2 kg), last menstrual period 12/08/2015, SpO2 95 %, unknown if currently breastfeeding.  Filed Weights   01/01/16 1600 01/05/16 0342  Weight: 179 lb 3.7 oz (81.3 kg) 181 lb 3.5 oz (82.2 kg)    Labs & Radiologic Studies     Liver Function Tests  Recent Labs  01/03/16 0924  AST 16  ALT 11*  ALKPHOS 46  BILITOT 0.6  PROT 6.7  ALBUMIN 3.1*    Dg Chest 2 View  01/01/2016  CLINICAL DATA:  Chest pain and shortness of Breath EXAM: CHEST  2 VIEW COMPARISON:  12/16/2015 FINDINGS: The heart size and mediastinal contours are within normal limits. Both lungs are clear. The visualized skeletal structures are unremarkable. IMPRESSION: No active cardiopulmonary disease. Electronically Signed   By: Inez Catalina M.D.   On: 01/01/2016 10:20   Dg Chest 2 View  12/16/2015  CLINICAL DATA:  Left-sided chest pain EXAM: CHEST  2 VIEW COMPARISON:  December 08, 2015 FINDINGS: Lungs are clear. Heart size and pulmonary vascularity are normal. No adenopathy. No bone lesions. No pneumothorax. IMPRESSION: No edema or consolidation. Electronically Signed   By: Lowella Grip III M.D.   On: 12/16/2015 08:30   Dg Chest 2 View  12/08/2015  CLINICAL DATA:  Chest pain. EXAM: CHEST  2 VIEW COMPARISON:  11/27/2015. FINDINGS: Mediastinum and hilar structures normal. Lungs are clear. No pleural effusion or pneumothorax. Heart size normal. IMPRESSION: No acute cardiopulmonary disease. Electronically Signed   By: Marcello Moores  Register   On: 12/08/2015 14:27   US Ob Comp Less 14 Wks  01/03/2016  CLINICAL DATA:  Follow-up evaluation. Patient administered methotrexate. EXAM: OBSTETRIC <14 WK [place] AND TRANSVAGINAL OB US TECHNIQUE: Both transabdominal and transvaginal ultrasound examinations were performed for complete evaluation of the gestation as well as the maternal uterus, adnexal regions, and pelvic cul-de-sac. Transvaginal technique was performed  to assess early pregnancy. COMPARISON:   Pelvic ultrasound 12/29/2015 FINDINGS: No intrauterine gestation is identified. The right and left ovaries are unremarkable. No adnexal masses are identified. The uterus measures 9.4 x 4.0 x 4.4 cm. Re- demonstrated multiple uterine fibroids measuring 5.3 cm within the anterior midline and 4.7 cm within the left aspect of the uterine body. Endometrium measures 5 mm. IMPRESSION: No intrauterine gestation identified. In the setting of positive pregnancy test and no definite intrauterine pregnancy, this reflects a pregnancy of unknown location. Differential considerations include early normal IUP, abnormal IUP, or nonvisualized ectopic pregnancy. Differentiation is achieved with serial beta HCG supplemented by repeat sonography as clinically warranted. Fibroid uterus. No adnexal masses identified. Electronically Signed   By: Lovey Newcomer M.D.   On: 01/03/2016 12:28   US Ob Comp Less 14 Wks  12/29/2015  CLINICAL DATA:  45 year old pregnant female with a history of ectopic pregnancy x 3 presents with 2 days of mid abdominal pain. Quantitative beta HCG of 148 on 12/28/2015. Repeat quantitative beta HCG level pending. EDC by LMP: 09/13/2016, projecting to an expected gestational age of [redacted] weeks 0 days EXAM: OBSTETRIC <14 WK [place] AND TRANSVAGINAL OB US TECHNIQUE: Both transabdominal and transvaginal ultrasound examinations were performed for complete evaluation of the gestation as well as the maternal uterus, adnexal regions, and pelvic cul-de-sac. Transvaginal technique was performed to assess early pregnancy. COMPARISON:  12/28/2015 obstetric scan. FINDINGS: The uterus is retroverted and retroflexed, measuring 10.3 x 7.1 x 6.9 cm in size. The uterus is enlarged by uterine fibroids as follows: - left anterior uterine body subserosal 4.5 x 4.4 x 3.6 cm fibroid - right fundal intramural 4.8 x 4.5 x 4.9 cm fibroid Endometrial visualization is limited by distortion by the surrounding fibroids. No intrauterine gestational sac  is demonstrated. No endometrial cavity fluid or focal endometrial mass is demonstrated. The left ovary measures 1.9 x 0.8 x 1.3 cm. The right ovary is not visualized. No suspicious ovarian or adnexal masses are demonstrated. No abnormal free fluid is seen in the pelvis. IMPRESSION: 1. Non localization of the pregnancy by ultrasound. The sonographic differential diagnosis remains an intrauterine gestation too early to visualize, a spontaneous abortion or an occult ectopic gestation. Continued close clinical follow-up and serial serum beta HCG monitoring are advised. Follow-up obstetric sonography as clinically warranted. 2. Enlarged myomatous uterus. 3. Normal left ovary. Nonvisualization of the right ovary. No suspicious adnexal findings. No abnormal free fluid in the pelvis. Electronically Signed   By: Ilona Sorrel M.D.   On: 12/29/2015 11:22   US Ob Comp Less 14 Wks  12/28/2015  CLINICAL DATA:  Abnormal bleeding in pregnancy. Quantitative beta HCG is 191.6. LMP is01/08/2016. Gestational age by LMP is2 weeks 6 days. EDC by LMP is 09/13/2016. EXAM: OBSTETRIC <14 WK [place] AND TRANSVAGINAL OB US TECHNIQUE: Both transabdominal and transvaginal ultrasound examinations were performed for complete evaluation of the gestation as well as the maternal uterus, adnexal regions, and pelvic cul-de-sac. Transvaginal technique was performed to assess early pregnancy. COMPARISON:  CT of the abdomen and pelvis on 11/25/2015 FINDINGS: Intrauterine gestational sac: Not seen Yolk sac:  Not seen Embryo:  Not seen Cardiac Activity: Absent Subchorionic hemorrhage:  None visualized. Maternal uterus/adnexae: Endometrial stripe is thin and homogeneous. Fundal fibroid is 6.0 x 4.6 x 5.0 cm. Left fundal fibroid is 4.5 x 4.2 x 3.7 cm. Small amount of fluid is identified in the right adnexal region. IMPRESSION: 1. Uterine fibroids present. 2. No intrauterine or adnexal pregnancy  identified. Serial quantitative beta HCG values and follow-up  ultrasound are recommended as appropriate to document progression of and location of pregnancy. Ectopic pregnancy has not been excluded. Electronically Signed   By: Nolon Nations M.D.   On: 12/28/2015 08:59   US Ob Transvaginal  01/03/2016  CLINICAL DATA:  Follow-up evaluation. Patient administered methotrexate. EXAM: OBSTETRIC <14 WK [place] AND TRANSVAGINAL OB US TECHNIQUE: Both transabdominal and transvaginal ultrasound examinations were performed for complete evaluation of the gestation as well as the maternal uterus, adnexal regions, and pelvic cul-de-sac. Transvaginal technique was performed to assess early pregnancy. COMPARISON:  Pelvic ultrasound 12/29/2015 FINDINGS: No intrauterine gestation is identified. The right and left ovaries are unremarkable. No adnexal masses are identified. The uterus measures 9.4 x 4.0 x 4.4 cm. Re- demonstrated multiple uterine fibroids measuring 5.3 cm within the anterior midline and 4.7 cm within the left aspect of the uterine body. Endometrium measures 5 mm. IMPRESSION: No intrauterine gestation identified. In the setting of positive pregnancy test and no definite intrauterine pregnancy, this reflects a pregnancy of unknown location. Differential considerations include early normal IUP, abnormal IUP, or nonvisualized ectopic pregnancy. Differentiation is achieved with serial beta HCG supplemented by repeat sonography as clinically warranted. Fibroid uterus. No adnexal masses identified. Electronically Signed   By: Lovey Newcomer M.D.   On: 01/03/2016 12:28   US Ob Transvaginal  12/29/2015  CLINICAL DATA:  45 year old pregnant female with a history of ectopic pregnancy x 3 presents with 2 days of mid abdominal pain. Quantitative beta HCG of 148 on 12/28/2015. Repeat quantitative beta HCG level pending. EDC by LMP: 09/13/2016, projecting to an expected gestational age of [redacted] weeks 0 days EXAM: OBSTETRIC <14 WK [place] AND TRANSVAGINAL OB US TECHNIQUE: Both transabdominal and  transvaginal ultrasound examinations were performed for complete evaluation of the gestation as well as the maternal uterus, adnexal regions, and pelvic cul-de-sac. Transvaginal technique was performed to assess early pregnancy. COMPARISON:  12/28/2015 obstetric scan. FINDINGS: The uterus is retroverted and retroflexed, measuring 10.3 x 7.1 x 6.9 cm in size. The uterus is enlarged by uterine fibroids as follows: - left anterior uterine body subserosal 4.5 x 4.4 x 3.6 cm fibroid - right fundal intramural 4.8 x 4.5 x 4.9 cm fibroid Endometrial visualization is limited by distortion by the surrounding fibroids. No intrauterine gestational sac is demonstrated. No endometrial cavity fluid or focal endometrial mass is demonstrated. The left ovary measures 1.9 x 0.8 x 1.3 cm. The right ovary is not visualized. No suspicious ovarian or adnexal masses are demonstrated. No abnormal free fluid is seen in the pelvis. IMPRESSION: 1. Non localization of the pregnancy by ultrasound. The sonographic differential diagnosis remains an intrauterine gestation too early to visualize, a spontaneous abortion or an occult ectopic gestation. Continued close clinical follow-up and serial serum beta HCG monitoring are advised. Follow-up obstetric sonography as clinically warranted. 2. Enlarged myomatous uterus. 3. Normal left ovary. Nonvisualization of the right ovary. No suspicious adnexal findings. No abnormal free fluid in the pelvis. Electronically Signed   By: Ilona Sorrel M.D.   On: 12/29/2015 11:22   US Ob Transvaginal  12/28/2015  CLINICAL DATA:  Abnormal bleeding in pregnancy. Quantitative beta HCG is 191.6. LMP is01/08/2016. Gestational age by LMP is2 weeks 6 days. EDC by LMP is 09/13/2016. EXAM: OBSTETRIC <14 WK [place] AND TRANSVAGINAL OB US TECHNIQUE: Both transabdominal and transvaginal ultrasound examinations were performed for complete evaluation of the gestation as well as the maternal uterus, adnexal regions, and pelvic  cul-de-sac. Transvaginal technique was performed to assess early pregnancy. COMPARISON:  CT of the abdomen and pelvis on 11/25/2015 FINDINGS: Intrauterine gestational sac: Not seen Yolk sac:  Not seen Embryo:  Not seen Cardiac Activity: Absent Subchorionic hemorrhage:  None visualized. Maternal uterus/adnexae: Endometrial stripe is thin and homogeneous. Fundal fibroid is 6.0 x 4.6 x 5.0 cm. Left fundal fibroid is 4.5 x 4.2 x 3.7 cm. Small amount of fluid is identified in the right adnexal region. IMPRESSION: 1. Uterine fibroids present. 2. No intrauterine or adnexal pregnancy identified. Serial quantitative beta HCG values and follow-up ultrasound are recommended as appropriate to document progression of and location of pregnancy. Ectopic pregnancy has not been excluded. Electronically Signed   By: Nolon Nations M.D.   On: 12/28/2015 08:59    Disposition   Pt is being discharged home today in good condition.  Follow-up Plans & Appointments    Follow-up Information    Follow up with Sharol Harness, MD On 01/19/2016.   Specialty:  Cardiology   Why:  11:15AM. Cardiology visit   Contact information:   [address] [place] [address] [phone]       Follow up with Lavonia Drafts, MD On 01/14/2016.   Specialty:  Obstetrics and Gynecology   Why:  9:00AM. GYN visit   Contact information:   [place] [place] [address] [phone]      Discharge Instructions    Diet - low sodium heart healthy    Complete by:  As directed      Discharge instructions    Complete by:  As directed   No driving for 24 hours. No lifting over 5 lbs for 1 week. No sexual activity for 1 week. Keep procedure site clean & dry. If you notice increased pain, swelling, bleeding or pus, call/return!  You may shower, but no soaking baths/hot tubs/pools for 1 week.     Increase activity slowly    Complete by:  As directed            Discharge Medications   Current  Discharge Medication List    START taking these medications   Details  atorvastatin (LIPITOR) 40 MG tablet Take 1 tablet (40 mg total) by mouth daily at 6 PM. Qty: 90 tablet, Refills: 3      CONTINUE these medications which have NOT CHANGED   Details  clonazePAM (KLONOPIN) 0.5 MG tablet Take 0.5 mg by mouth daily as needed for anxiety.   Associated Diagnoses: History of ectopic pregnancy; Abdominal pain affecting pregnancy, antepartum; Ectopic pregnancy    famotidine (PEPCID) 40 MG tablet Take 40 mg by mouth 2 (two) times daily as needed for heartburn or indigestion.   Associated Diagnoses: History of ectopic pregnancy; Abdominal pain affecting pregnancy, antepartum; Ectopic pregnancy    hydrochlorothiazide (HYDRODIURIL) 12.5 MG tablet Take 1 tablet (12.5 mg total) by mouth daily. Qty: 90 tablet, Refills: 3    propranolol (INDERAL) 20 MG tablet Take 10 mg by mouth 2 (two) times daily.    traMADol (ULTRAM) 50 MG tablet Take 1-2 tablets (50-100 mg total) by mouth every 6 (six) hours as needed for severe pain. Qty: 15 tablet, Refills: 0    ondansetron (ZOFRAN) 4 MG tablet Take 1 tablet (4 mg total) by mouth every 6 (six) hours as needed for nausea or vomiting. Qty: 30 tablet, Refills: 0    sucralfate (CARAFATE) 1 GM/10ML suspension Take 10 mLs (1 g total) by mouth 3 (three) times daily as needed.  Qty: 420 mL, Refills: 2          Duration of Discharge Encounter   Greater than 30 minutes including physician time.  Hilbert Corrigan PA-C 01/05/2016, 12:39 PM

## 2016-01-08 ENCOUNTER — Emergency Department (HOSPITAL_COMMUNITY)
Admission: EM | Admit: 2016-01-08 | Discharge: 2016-01-08 | Disposition: A | Discharge status: Home / Self Care | Payer: BLUE CROSS/BLUE SHIELD | Attending: Emergency Medicine | Admitting: Emergency Medicine

## 2016-01-08 ENCOUNTER — Encounter (HOSPITAL_COMMUNITY): Payer: Self-pay | Admitting: Emergency Medicine

## 2016-01-08 DIAGNOSIS — Z862 Personal history of diseases of the blood and blood-forming organs and certain disorders involving the immune mechanism: Secondary | ICD-10-CM | POA: Insufficient documentation

## 2016-01-08 DIAGNOSIS — Z79899 Other long term (current) drug therapy: Secondary | ICD-10-CM | POA: Diagnosis not present

## 2016-01-08 DIAGNOSIS — Z86018 Personal history of other benign neoplasm: Secondary | ICD-10-CM | POA: Insufficient documentation

## 2016-01-08 DIAGNOSIS — F329 Major depressive disorder, single episode, unspecified: Secondary | ICD-10-CM | POA: Diagnosis not present

## 2016-01-08 DIAGNOSIS — K625 Hemorrhage of anus and rectum: Secondary | ICD-10-CM

## 2016-01-08 DIAGNOSIS — I10 Essential (primary) hypertension: Secondary | ICD-10-CM | POA: Diagnosis not present

## 2016-01-08 DIAGNOSIS — K219 Gastro-esophageal reflux disease without esophagitis: Secondary | ICD-10-CM | POA: Diagnosis not present

## 2016-01-08 DIAGNOSIS — R195 Other fecal abnormalities: Secondary | ICD-10-CM | POA: Insufficient documentation

## 2016-01-08 DIAGNOSIS — F419 Anxiety disorder, unspecified: Secondary | ICD-10-CM | POA: Diagnosis not present

## 2016-01-08 LAB — POC OCCULT BLOOD, ED: FECAL OCCULT BLD: POSITIVE — AB

## 2016-01-08 NOTE — ED Notes (Signed)
Pt states she was just released from [facility] on Tuesday after being there for four days  Pt states she had a heart cath and angiography while there and was told her heart looked good  Pt states her chest pain has been continuous  Pt states yesterday she had a bowel movement and it had some blood in it and today she was coughing and coughed up some blood    Pt states her chest pain is okay right now but she is concerned about the blood

## 2016-01-08 NOTE — Discharge Instructions (Signed)

## 2016-01-08 NOTE — ED Provider Notes (Signed)
CSN: [id number]     Arrival date & time 01/08/16  0107 History  By signing my name below, I, Starleen Arms, attest that this documentation has been prepared under the direction and in the presence of Charlann Lange, PA-C. Electronically Signed: Starleen Arms ED Scribe. 01/08/2016. 1:36 AM.    Chief Complaint  Patient presents with  . Rectal Bleeding   The history is provided by the patient. No language interpreter was used.   HPI Comments: Stacy [name] is a 45 y.o. female who presents to the Emergency Department complaining of bright red bloody stool that first occurred last night.  She also notes some recent constipation and upper abdominal pain.  Today, the patient reports gagging on water while swallowing a pill, which caused her to vomit a small amount of blood.  She called her PCP today who advised her to take Metamucil and scheduled her an appointment for Monday.  The patient reports reports having a colonoscopy and EGD 3 years ago after episodes non-bloody nausea and vomiting.  At that time, those episodes were attributed to GERD.  She denies diarrhea, dysuria.    PCP: Osei-Bonsu  Past Medical History  Diagnosis Date  . Esophageal stricture   . Chest pain 09/14/2009    no pulmonary embolus by chest CT  . Palpitations   . Elevated BP   . Anxiety     past Hx  . Depression     past Hx  . GERD (gastroesophageal reflux disease)     no meds  . Sickle cell trait ([place])   . Fibroid   . Abnormal Pap smear     cryo, normal since  . Fibroids 09/24/2015  . Hypertension   . Essential hypertension 09/28/2009    Qualifier: Diagnosis of  By: [name] FNP, [place]    . Arrhythmia   . Vaginal Pap smear, abnormal    Past Surgical History  Procedure Laterality Date  . Cesarean section  2004  . Laparoscopy for ectopic pregnancy  2001  . Wisdom tooth extraction  2012  . Cardiac catheterization N/A 01/04/2016    Procedure: Left Heart Cath and Coronary Angiography;  Surgeon: Belva Crome, MD;  Location: [place] [facility];  Service: Cardiovascular;  Laterality: N/A;   Family History  Problem Relation Age of Onset  . Asthma Mother   . Hypertension Mother   . Hypertension Paternal Uncle   . Asthma Maternal Grandmother   . Colon cancer Neg Hx   . Esophageal cancer Neg Hx   . Stomach cancer Neg Hx   . Asthma Daughter   . Thyroid disease Mother   . Heart Problems Mother    Social History  Substance Use Topics  . Smoking status: Never Smoker   . Smokeless tobacco: Never Used  . Alcohol Use: No   OB History    Gravida Para Term Preterm AB TAB SAB Ectopic Multiple Living   7 2 2  0 3 0 0 3 0 2     Review of Systems    Allergies  Hydromorphone; Morphine; and Diphenhydramine  Home Medications   Prior to Admission medications   Medication Sig Start Date End Date Taking? Authorizing Provider  atorvastatin (LIPITOR) 40 MG tablet Take 1 tablet (40 mg total) by mouth daily at 6 PM. 01/05/16   Almyra Deforest, PA  clonazePAM (KLONOPIN) 0.5 MG tablet Take 0.5 mg by mouth daily as needed for anxiety.    Historical Provider, MD  famotidine (PEPCID) 40 MG  tablet Take 40 mg by mouth 2 (two) times daily as needed for heartburn or indigestion.    Historical Provider, MD  hydrochlorothiazide (HYDRODIURIL) 12.5 MG tablet Take 1 tablet (12.5 mg total) by mouth daily. Patient taking differently: Take 12.5 mg by mouth daily before supper.  12/06/15   Skeet Latch, MD  ondansetron (ZOFRAN) 4 MG tablet Take 1 tablet (4 mg total) by mouth every 6 (six) hours as needed for nausea or vomiting. 12/31/15   Irene Shipper, MD  propranolol (INDERAL) 20 MG tablet Take 10 mg by mouth 2 (two) times daily.    Historical Provider, MD  sucralfate (CARAFATE) 1 GM/10ML suspension Take 10 mLs (1 g total) by mouth 3 (three) times daily as needed. 12/31/15   Irene Shipper, MD  traMADol (ULTRAM) 50 MG tablet Take 1-2 tablets (50-100 mg total) by mouth every 6 (six) hours as needed for severe pain. 12/29/15    Thersa Salt Smith, CNM   BP 120/79 mmHg  Pulse 88  Temp(Src) 98.1 F (36.7 C) (Oral)  Resp 20  SpO2 100%  LMP 12/25/2015 (Approximate) Physical Exam  Constitutional: She is oriented to person, place, and time. She appears well-developed and well-nourished. No distress.  HENT:  Head: Normocephalic and atraumatic.  Eyes: Conjunctivae and EOM are normal.  Neck: Neck supple. No tracheal deviation present.  Cardiovascular: Normal rate.   Pulmonary/Chest: Effort normal and breath sounds normal.  Abdominal: Soft. There is no tenderness.  Genitourinary: Guaiac positive stool.  Brown stool on rectal exam.   Musculoskeletal: Normal range of motion.  Neurological: She is alert and oriented to person, place, and time.  Skin: Skin is warm and dry.  Psychiatric: She has a normal mood and affect. Her behavior is normal.  Nursing note and vitals reviewed.   ED Course  Procedures (including critical care time)  DIAGNOSTIC STUDIES: Oxygen Saturation is 100% on RA, normal by my interpretation.    COORDINATION OF CARE:  1:46 AM Discussed plan to perform rectal exam.  Patient acknowledges and agrees with plan.    Labs Review Labs Reviewed - No data to display  Imaging Review No results found. I have personally reviewed and evaluated these images and lab results as part of my medical decision-making.   EKG Interpretation   Date/Time:  Friday January 08 2016 01:15:47 EST Ventricular Rate:  89 PR Interval:  145 QRS Duration: 105 QT Interval:  358 QTC Calculation: 436 R Axis:   43 Text Interpretation:  Sinus rhythm Confirmed by [facility]  MD, APRIL  ([id number]) on 01/08/2016 1:28:05 AM      MDM   Final diagnoses:  None    1. Guaiac positive stool  Patient is in no acute distress. VSS, no tachycardia. No conjunctival pallor. No orthostasis. Doubt anemia. Normal hgb on 01/03/16. She has had endoscopies in the last year per chart review at [facility]. She can be discharged home and will  refer to GI in [place] per patient request. No further evaluation thought beneficial tonight.   I personally performed the services described in this documentation, which was scribed in my presence. The recorded information has been reviewed and is accurate.     Charlann Lange, PA-C 01/08/16 [id number]  April Palumbo, MD 01/08/16 [phone]

## 2016-01-12 ENCOUNTER — Other Ambulatory Visit: Payer: BLUE CROSS/BLUE SHIELD

## 2016-01-12 DIAGNOSIS — O3680X0 Pregnancy with inconclusive fetal viability, not applicable or unspecified: Secondary | ICD-10-CM

## 2016-01-13 LAB — HCG, QUANTITATIVE, PREGNANCY

## 2016-01-14 ENCOUNTER — Emergency Department (HOSPITAL_BASED_OUTPATIENT_CLINIC_OR_DEPARTMENT_OTHER): Payer: BLUE CROSS/BLUE SHIELD

## 2016-01-14 ENCOUNTER — Encounter (HOSPITAL_BASED_OUTPATIENT_CLINIC_OR_DEPARTMENT_OTHER): Payer: Self-pay | Admitting: Emergency Medicine

## 2016-01-14 ENCOUNTER — Encounter: Payer: Self-pay | Admitting: Obstetrics & Gynecology

## 2016-01-14 ENCOUNTER — Ambulatory Visit: Payer: Self-pay | Admitting: Obstetrics & Gynecology

## 2016-01-14 DIAGNOSIS — G8929 Other chronic pain: Secondary | ICD-10-CM | POA: Insufficient documentation

## 2016-01-14 DIAGNOSIS — K219 Gastro-esophageal reflux disease without esophagitis: Secondary | ICD-10-CM | POA: Diagnosis not present

## 2016-01-14 DIAGNOSIS — F419 Anxiety disorder, unspecified: Secondary | ICD-10-CM | POA: Diagnosis not present

## 2016-01-14 DIAGNOSIS — R079 Chest pain, unspecified: Secondary | ICD-10-CM | POA: Diagnosis present

## 2016-01-14 DIAGNOSIS — I499 Cardiac arrhythmia, unspecified: Secondary | ICD-10-CM | POA: Insufficient documentation

## 2016-01-14 DIAGNOSIS — I10 Essential (primary) hypertension: Secondary | ICD-10-CM | POA: Diagnosis not present

## 2016-01-14 DIAGNOSIS — Z86018 Personal history of other benign neoplasm: Secondary | ICD-10-CM | POA: Insufficient documentation

## 2016-01-14 DIAGNOSIS — Z862 Personal history of diseases of the blood and blood-forming organs and certain disorders involving the immune mechanism: Secondary | ICD-10-CM | POA: Insufficient documentation

## 2016-01-14 DIAGNOSIS — Z3202 Encounter for pregnancy test, result negative: Secondary | ICD-10-CM | POA: Insufficient documentation

## 2016-01-14 DIAGNOSIS — Z79899 Other long term (current) drug therapy: Secondary | ICD-10-CM | POA: Diagnosis not present

## 2016-01-14 DIAGNOSIS — F329 Major depressive disorder, single episode, unspecified: Secondary | ICD-10-CM | POA: Insufficient documentation

## 2016-01-14 NOTE — ED Notes (Signed)
Pt states she was just released from [facility] two weeks ago after being there for four days  Pt states she had a heart cath and angiography while there and was told her heart looked good  The patient reports that she has similar pain to her left chest and she was late taking the medications tonight. Patient is holding her chest

## 2016-01-15 ENCOUNTER — Ambulatory Visit (INDEPENDENT_AMBULATORY_CARE_PROVIDER_SITE_OTHER): Payer: BLUE CROSS/BLUE SHIELD | Admitting: Family Medicine

## 2016-01-15 ENCOUNTER — Emergency Department (HOSPITAL_BASED_OUTPATIENT_CLINIC_OR_DEPARTMENT_OTHER)
Admission: EM | Admit: 2016-01-15 | Discharge: 2016-01-15 | Disposition: A | Discharge status: Home / Self Care | Payer: BLUE CROSS/BLUE SHIELD | Attending: Emergency Medicine | Admitting: Emergency Medicine

## 2016-01-15 ENCOUNTER — Encounter: Payer: Self-pay | Admitting: Family Medicine

## 2016-01-15 ENCOUNTER — Other Ambulatory Visit (HOSPITAL_COMMUNITY)
Admission: RE | Admit: 2016-01-15 | Discharge: 2016-01-15 | Disposition: A | Discharge status: Home / Self Care | Payer: BLUE CROSS/BLUE SHIELD | Source: Ambulatory Visit | Attending: Family Medicine | Admitting: Family Medicine

## 2016-01-15 ENCOUNTER — Encounter (HOSPITAL_BASED_OUTPATIENT_CLINIC_OR_DEPARTMENT_OTHER): Payer: Self-pay | Admitting: Emergency Medicine

## 2016-01-15 VITALS — BP 100/70 | HR 85 | Temp 98.0°F | Wt 184.5 lb

## 2016-01-15 DIAGNOSIS — N921 Excessive and frequent menstruation with irregular cycle: Secondary | ICD-10-CM | POA: Diagnosis present

## 2016-01-15 DIAGNOSIS — K219 Gastro-esophageal reflux disease without esophagitis: Secondary | ICD-10-CM

## 2016-01-15 LAB — POCT PREGNANCY, URINE: PREG TEST UR: NEGATIVE

## 2016-01-15 LAB — PREGNANCY, URINE: Preg Test, Ur: NEGATIVE

## 2016-01-15 LAB — TROPONIN I

## 2016-01-15 MED ORDER — DICYCLOMINE HCL 10 MG/ML IM SOLN
20.0000 mg | Freq: Once | INTRAMUSCULAR | Status: AC
  Administered 2016-01-15: 20 mg via INTRAMUSCULAR
  Filled 2016-01-15: qty 2

## 2016-01-15 MED ORDER — KETOROLAC TROMETHAMINE 30 MG/ML IJ SOLN
30.0000 mg | Freq: Once | INTRAMUSCULAR | Status: AC
  Administered 2016-01-15: 30 mg via INTRAVENOUS
  Filled 2016-01-15: qty 1

## 2016-01-15 MED ORDER — DICYCLOMINE HCL 20 MG PO TABS: 20.0000 mg | ORAL_TABLET | Freq: Two times a day (BID) | ORAL | Status: DC

## 2016-01-15 MED ORDER — SUCRALFATE 1 GM/10ML PO SUSP: 1.0000 g | Freq: Three times a day (TID) | ORAL | Status: DC

## 2016-01-15 MED ORDER — MISOPROSTOL 200 MCG PO TABS: 600.0000 ug | ORAL_TABLET | Freq: Once | ORAL | Status: DC

## 2016-01-15 MED ORDER — GI COCKTAIL ~~LOC~~
30.0000 mL | Freq: Once | ORAL | Status: AC
  Administered 2016-01-15: 30 mL via ORAL
  Filled 2016-01-15: qty 30

## 2016-01-15 NOTE — Progress Notes (Signed)
  ENDOMETRIAL BIOPSY     The indications for endometrial biopsy were reviewed.   Risks of the biopsy including cramping, bleeding, infection, uterine perforation, inadequate specimen and need for additional procedures  were discussed. The patient states she understands and agrees to undergo procedure today. Consent was signed. Time out was performed. Urine HCG was negative. A sterile speculum was placed in the patient's vagina and the cervix was prepped with Betadine. A single-toothed tenaculum was placed on the anterior lip of the cervix to stabilize it. The 3 mm pipelle was introduced into the endometrial cavity without difficulty to a depth of 7 cm, and a moderate amount of tissue was obtained and sent to pathology. The instruments were removed from the patient's vagina. Minimal bleeding from the cervix was noted. The patient tolerated the procedure well. Routine post-procedure instructions were given to the patient. The patient will follow up to review the results and for further management.

## 2016-01-15 NOTE — ED Provider Notes (Signed)
CSN: [id number]     Arrival date & time 01/14/16  2332 History   First MD Initiated Contact with Patient 01/15/16 0030     Chief Complaint  Patient presents with  . Chest Pain     (Consider location/radiation/quality/duration/timing/severity/associated sxs/prior Treatment) Patient is a 45 y.o. female presenting with chest pain. The history is provided by the patient.  Chest Pain Pain location:  L chest Pain quality: not shooting   Pain radiates to:  Does not radiate Pain radiates to the back: no   Pain severity:  Moderate Onset quality:  Gradual Timing:  Constant Progression:  Unchanged Chronicity:  Chronic Context: no trauma   Relieved by:  Nothing Worsened by:  Nothing tried Ineffective treatments:  None tried Associated symptoms: no fever, no near-syncope, no palpitations, no shortness of breath and no weakness   Risk factors: no birth control and no coronary artery disease   Just had a clean cardiac catheterization.  Has yet to have her EGD at [facility]  Past Medical History  Diagnosis Date  . Esophageal stricture   . Chest pain 09/14/2009    no pulmonary embolus by chest CT  . Palpitations   . Elevated BP   . Anxiety     past Hx  . Depression     past Hx  . GERD (gastroesophageal reflux disease)     no meds  . Sickle cell trait ([place])   . Fibroid   . Abnormal Pap smear     cryo, normal since  . Fibroids 09/24/2015  . Hypertension   . Essential hypertension 09/28/2009    Qualifier: Diagnosis of  By: [name] FNP, [place]    . Arrhythmia   . Vaginal Pap smear, abnormal    Past Surgical History  Procedure Laterality Date  . Cesarean section  2004  . Laparoscopy for ectopic pregnancy  2001  . Wisdom tooth extraction  2012  . Cardiac catheterization N/A 01/04/2016    Procedure: Left Heart Cath and Coronary Angiography;  Surgeon: Belva Crome, MD;  Location: [place] [facility];  Service: Cardiovascular;  Laterality: N/A;   Family History  Problem Relation Age  of Onset  . Asthma Mother   . Hypertension Mother   . Hypertension Paternal Uncle   . Asthma Maternal Grandmother   . Colon cancer Neg Hx   . Esophageal cancer Neg Hx   . Stomach cancer Neg Hx   . Asthma Daughter   . Thyroid disease Mother   . Heart Problems Mother    Social History  Substance Use Topics  . Smoking status: Never Smoker   . Smokeless tobacco: Never Used  . Alcohol Use: No   OB History    Gravida Para Term Preterm AB TAB SAB Ectopic Multiple Living   7 2 2  0 3 0 0 3 0 2     Review of Systems  Constitutional: Negative for fever.  Respiratory: Negative for shortness of breath.   Cardiovascular: Positive for chest pain. Negative for palpitations, leg swelling and near-syncope.  Neurological: Negative for weakness.  All other systems reviewed and are negative.     Allergies  Hydromorphone; Morphine; and Diphenhydramine  Home Medications   Prior to Admission medications   Medication Sig Start Date End Date Taking? Authorizing Provider  atorvastatin (LIPITOR) 40 MG tablet Take 1 tablet (40 mg total) by mouth daily at 6 PM. 01/05/16   Almyra Deforest, PA  clonazePAM (KLONOPIN) 0.5 MG tablet Take 0.5 mg by mouth daily  as needed for anxiety.    Historical Provider, MD  famotidine (PEPCID) 40 MG tablet Take 40 mg by mouth 2 (two) times daily as needed for heartburn or indigestion.    Historical Provider, MD  hydrochlorothiazide (HYDRODIURIL) 12.5 MG tablet Take 1 tablet (12.5 mg total) by mouth daily. Patient taking differently: Take 12.5 mg by mouth daily before supper.  12/06/15   Skeet Latch, MD  ondansetron (ZOFRAN) 4 MG tablet Take 1 tablet (4 mg total) by mouth every 6 (six) hours as needed for nausea or vomiting. 12/31/15   Irene Shipper, MD  propranolol (INDERAL) 20 MG tablet Take 10 mg by mouth 2 (two) times daily.    Historical Provider, MD  sucralfate (CARAFATE) 1 GM/10ML suspension Take 10 mLs (1 g total) by mouth 3 (three) times daily as needed. 12/31/15   Irene Shipper, MD  traMADol (ULTRAM) 50 MG tablet Take 1-2 tablets (50-100 mg total) by mouth every 6 (six) hours as needed for severe pain. 12/29/15   Manya Silvas, CNM   Pulse 78  Resp 17  SpO2 100%  LMP 12/25/2015 (Approximate) Physical Exam  Constitutional: She is oriented to person, place, and time. She appears well-developed and well-nourished. No distress.  HENT:  Head: Normocephalic and atraumatic.  Mouth/Throat: Oropharynx is clear and moist.  Eyes: Conjunctivae are normal. Pupils are equal, round, and reactive to light.  Neck: Normal range of motion. Neck supple.  Cardiovascular: Normal rate, regular rhythm and intact distal pulses.   Pulmonary/Chest: Effort normal and breath sounds normal. No respiratory distress. She has no wheezes. She has no rales.  Abdominal: Soft. Bowel sounds are normal. There is no tenderness. There is no rebound.  Musculoskeletal: Normal range of motion. She exhibits no edema or tenderness.  Neurological: She is alert and oriented to person, place, and time.  Skin: Skin is warm and dry.  Psychiatric: Her mood appears anxious.    ED Course  Procedures (including critical care time) Labs Review Labs Reviewed  TROPONIN I  PREGNANCY, URINE    Imaging Review Dg Chest 2 View  01/15/2016  CLINICAL DATA:  45 year old female with chest pain EXAM: CHEST  2 VIEW COMPARISON:  Radiograph dated 01/01/2016 FINDINGS: The heart size and mediastinal contours are within normal limits. Both lungs are clear. The visualized skeletal structures are unremarkable. IMPRESSION: No active cardiopulmonary disease. Electronically Signed   By: Anner Crete M.D.   On: 01/15/2016 00:41   I have personally reviewed and evaluated these images and lab results as part of my medical decision-making.   EKG Interpretation   Date/Time:  Thursday January 14 2016 23:42:36 EST Ventricular Rate:  92 PR Interval:  138 QRS Duration: 94 QT Interval:  352 QTC Calculation: 435 R Axis:    56 Text Interpretation:  Normal sinus rhythm Confirmed by [facility]  MD,  Jatziry Wechter ([id number]) on 01/15/2016 1:29:30 AM      MDM   Final diagnoses:  None    Symptoms consistent with GERD and anxiety.  Will treat GERD and have patient follow up with her PMD and GI specialist   Henry Demeritt, MD 01/15/16 [id number]

## 2016-01-15 NOTE — Patient Instructions (Signed)

## 2016-01-17 NOTE — Progress Notes (Signed)
Cardiology Office Note   Date:  01/18/2016   ID:  Stacy [name], DOB May 28, 1971, MRN [id number]  PCP:  Stacy Mccreedy, MD  Cardiologist:   Stacy Harness, MD   Chief Complaint  Patient presents with  . Follow-up    gastric chest pain, no swelling, no cramping, no shortness of breath, no dizziness or lightheadedness      Patient ID: Stacy [name] is a 45 y.o. female with hypertension, obesity, anxiety and depression who presents for an evaluation of chest pain and shortness of breath.    Interval History 01/18/16: Stacy [name] was admitted to the hospital 2/3-2/7.  She presented with chest pain and was admitted due to a troponin of 0.07.  However, follow up troponin was <0.03.  She had an echo that revealed LVEF 60-65% and grade 1 diastolic dysfunction.  She underwent cardiac catheterization that revealed normal cardiac catheterization.  She had an episode of heart racing to 160 bpm that awakened her from sleeping.  She has otherwise been feeling well.  She is currently on FMLA.  She has been seen by a gastoenterologist who is planning for upper and lower endoscopy.  She had an episode of hematochezia that has since resolved. She is no longer bleeding from her ectopic pregnancy.  Interval History 12/03/15: Since her last appointment, Stacy [name] been seen multiple times in the emergency department with chest pain.  Each time her cardiac enzymes are unremarkable.She had a CT scan of the abdomen and pelvis on 12/28that revealed uterine fibroids but was otherwise unremarkable.  She had another episode of chest pain while siting in the movies.  It was intense substernal chest discomfort with shortness of breath.  She checked her blood pressure with a wrist monitor and her systolic BP was [id number] mmHg.  She was also tachycardic to the 160s.  EMS was called and her BP was in the 170s.  By the time she arrived to the ED her vitals had normalized without  intervention.  Stacy [name] followed up with a new PCP at the [facility] who started her on klonazepam.  Her Neurologist recommended Buspar but she has yet to start this medication.  She has follow up with a gastroenterologist to determine if GERD or Celiac disease could be causing her symptoms.  She has eliminated salt and pork from her diet with the hope that this will help her blood pressure.  At one of her ED visits she was reportedly instructed to increase propranolol to 20 mg bid.  However, her systolic BP was 80 after making this change.  Therefore, she reduced propranolol back to 10 mg bid.     Interval history 11/12/15: Since her last appointment, Stacy [name] had a stress test that was negative for ischemia.  She was hypertensive during her ETT, so she was started on HCTZ 12.5 mg daily.  She continues to have poorly-controlled blood pressure despite taking HCTZ and reducing her salt intake.  She also underwent a 7 day event monitor, during which she reported symptoms but no arrhythmias were noted.  Stacy [name] has been seen in the ED 6 times with palpitations and near syncope.  Her home monitor has recorded blood pressures as high as 180/110 with a heart rate in the 160s. In the ED her vitals have been unremarkable.  She reports episodes of labile blood pressure. She was seen by her OB/GYN and initially had a normal blood pressure. However while sitting there she started to feel unwell.  She was noted to beta tachycardic with a heart rate of 126 and her blood pressure increased to 159/91.  He was referred to the emergency department were no abnormalities were noted other than a heart rate of 109.  She switched from metoprolol to propranolol and continues to feel fatigued.  Her symptoms are always worse at night.Sometimes she awakens with palpitations and her heart rate in the 120s. She continues to have pain in her chest and radiating down into her left arm. She has no energy or  appetite  TSH is normal.  At one of her ED visits she was started on metoprolol and followed up with Stacy [name] on 11/30.  At that appointment her ECG showed sinus tachycardia at 108 bpm.  She was seen by Stacy Rinne, PA-C on 12/7, at which time she noted fatigue on metoprolol.  She was advised to continue metoprolol for at least a week before deciding to switch to propranol as recommended by her PCP.  This am she has GERD and feels generally unwell.  Stacy [name] reports that she was unable to sleep during the sleep study.  The study was negative for obstructive sleep apnea.  Stacy [name] [name], Stacy [name], was recently accepted to [employer]. She also was a reported nearly a full right scholarship.   History of Present Illness 09/16/15: She was evaluated in the ED on 9/9 for chest pain and shortness of breath.  Cardiac enzymes and EKG were unremarkable.  LE ultrasound was negative for DVT one week prior to her ED visit.  Stacy [name] reports that she has been feeling poorly for a while now.  For the last year she has noted intermittent palpitations and shortness of breath. The episodes occur 3-4 times per week and on days when they occur they happen several times throughout the day. They're associated with lightheadedness and dizziness and last for a few seconds at a time. She denies chest pain, nausea or diaphoresis when this occurs. The symptoms typically occur at rest and not with exertion.  Stacy [name] reports 3-4 months of significant fatigue. She is very tired halfway through her day at work and also with carrying groceries or doing light housework. She does not get any exercise. She denies chest pain with exertion. She also notes pain in bilateral shoulders radiates down into her arms. There is also associated numbness to her elbows. She endorses occasional lower extremity edema the right leg. She did undergo ultrasound testing that was negative for DVT.   She  reports seeing a cardiologist over one year ago.  She had a treadmill stress test that were reportedly normal.  She was told that she probably had normal skipped beats but did not have any ambulatory monitoring.   2 months of difficulty staying asleep.  Her [name] notes that she stops breathing overnight and this Stacy [name] reports sometimes waking up gasping for air.  She has acid reflux and sometimes can't differentiate that from her heart.   Of note, her mother has heart disease and recently had a heart cath.  She does not know was found at the time of cardiac catheterization.  Past Medical History  Diagnosis Date  . Esophageal stricture   . Chest pain 09/14/2009    no pulmonary embolus by chest CT  . Palpitations   . Elevated BP   . Anxiety     past Hx  . Depression     past Hx  . GERD (gastroesophageal reflux disease)  no meds  . Sickle cell trait ([place])   . Fibroid   . Abnormal Pap smear     cryo, normal since  . Fibroids 09/24/2015  . Hypertension   . Essential hypertension 09/28/2009    Qualifier: Diagnosis of  By: [name] FNP, [place]    . Arrhythmia   . Vaginal Pap smear, abnormal     Past Surgical History  Procedure Laterality Date  . Cesarean section  2004  . Laparoscopy for ectopic pregnancy  2001  . Wisdom tooth extraction  2012  . Cardiac catheterization N/A 01/04/2016    Procedure: Left Heart Cath and Coronary Angiography;  Surgeon: Belva Crome, MD;  Location: [facility];  Service: Cardiovascular;  Laterality: N/A;     Current Outpatient Prescriptions  Medication Sig Dispense Refill  . atorvastatin (LIPITOR) 40 MG tablet Take 1 tablet (40 mg total) by mouth daily at 6 PM. 90 tablet 3  . clonazePAM (KLONOPIN) 0.5 MG tablet Take 0.5 mg by mouth daily as needed for anxiety.    . famotidine (PEPCID) 40 MG tablet Take 40 mg by mouth 2 (two) times daily as needed for heartburn or indigestion.    . propranolol (INDERAL) 20 MG tablet Take 10  mg by mouth 2 (two) times daily.     No current facility-administered medications for this visit.   Facility-Administered Medications Ordered in Other Visits  Medication Dose Route Frequency Provider Last Rate Last Dose  . iohexol (OMNIPAQUE) 300 MG/ML solution 100 mL  100 mL Intravenous Once PRN Jola Schmidt, MD        Allergies:   Hydromorphone; Morphine; and Diphenhydramine    Social History:  The patient  reports that she has never smoked. She has never used smokeless tobacco. She reports that she does not drink alcohol or use illicit drugs.   Family History:  The patient's family history includes Asthma in her [name], maternal grandmother, and mother; Heart Problems in her mother; Hypertension in her mother and paternal uncle; Thyroid disease in her mother. There is no history of Colon cancer, Esophageal cancer, or Stomach cancer.    ROS:  Please see the history of present illness.   Otherwise, review of systems are positive for none.   All other systems are reviewed and negative.    PHYSICAL EXAM: VS:  BP 100/76 mmHg  Pulse 72  Ht 5\' 6"  (1.676 m)  Wt 84.823 kg (187 lb)  BMI 30.20 kg/m2  LMP 12/25/2015 (Approximate) , BMI Body mass index is 30.2 kg/(m^2). GENERAL:  Well appearing HEENT:  Pupils equal round and reactive, fundi not visualized, oral mucosa unremarkable NECK:  No jugular venous distention, waveform within normal limits, carotid upstroke brisk and symmetric, no bruits, no thyromegaly LYMPHATICS:  No cervical adenopathy LUNGS:  Clear to auscultation bilaterally HEART:  RRR.  PMI not displaced or sustained,S1 and S2 within normal limits, no S3, no S4, no clicks, no rubs, no murmurs ABD:  Flat, positive bowel sounds normal in frequency in pitch, no bruits, no rebound, no guarding, no midline pulsatile mass, no hepatomegaly, no splenomegaly EXT:  2 plus pulses throughout, no edema, no cyanosis no clubbing SKIN:  No rashes no nodules NEURO:  Cranial nerves II through  XII grossly intact, motor grossly intact throughout PSYCH:  Cognitively intact, oriented to person place and time   EKG:  EKG is not ordered today.  ETT 10/15/15:   Blood pressure demonstrated a hypertensive response to exercise.  Upsloping ST segment depression  ST segment depression of 2 mm was noted during stress in the II, III, aVF, V6, V4 and V5 leads.  The patient experienced no angina during the stress test.  Overall, the patient's exercise capacity was normal.  [facility] Treadmill Score:low risk  Negative stress test without evidence of ischemia at given workload. Upsloping ST depression noted - may be due to LVH. Significant hypertension noted with rest BP 186/102 prior to exercise.  Echo 11/09/15: LVEF 76%.  Mild thickening of the mitral valve.  Trace MR and TR.  RVSP 28 mmHg.  7 Day Event Monitor 09/16/15:  Quality: Fair. Baseline artifact. Predominant rhythm: sinus rhythm Pauses >2.5 seconds: 0 No PVCs or PACs were noted.  Patient did submit a symptom diary. She reported heart racing, skipped beats, and chest pain. At those times the underlying rhythm was sinus rhythm with rates from 70-93 bpm and one episode of sinus tachycardia, rate 113 bpm.  Echo 01/02/16: Study Conclusions  - Left ventricle: The cavity size was normal. Wall thickness was increased in a pattern of mild LVH. Systolic function was normal. The estimated ejection fraction was in the range of 60% to 65%. Wall motion was normal; there were no regional wall motion abnormalities. Doppler parameters are consistent with abnormal left ventricular relaxation (grade 1 diastolic dysfunction). - Mitral valve: There was trivial regurgitation. - Right atrium: Central venous pressure (est): 3 mm Hg. - Tricuspid valve: There was trivial regurgitation. - Pulmonary arteries: PA peak pressure: 10 mm Hg (S). - Pericardium, extracardiac: A trivial pericardial effusion was identified posterior to the  heart.  Impressions:  - Mild LVH with LVEF 60-65% and probable grade 1 diastolic dysfunction with normal LV filling pressure. Trivial mitral and tricuspid regurgitation. Possible trivial posterior pericardial effusion.  LHC 01/04/16:  Normal coronary arteries  Normal left ventricular function and hemodynamics  Chest pain is non-ischemic.   Recent Labs: 08/07/2015: B Natriuretic Peptide 27.2 12/16/2015: TSH 1.193 01/02/2016: BUN 9; Creatinine, Ser 0.80; Hemoglobin 10.7*; Magnesium 2.1; Platelets 333; Potassium 4.0; Sodium 136 01/03/2016: ALT 11*    Lipid Panel    Component Value Date/Time   CHOL 197 01/02/2016 0249   TRIG 41 01/02/2016 0249   HDL 57 01/02/2016 0249   CHOLHDL 3.5 01/02/2016 0249   VLDL 8 01/02/2016 0249   LDLCALC 132* 01/02/2016 0249      Wt Readings from Last 3 Encounters:  01/18/16 84.823 kg (187 lb)  01/15/16 83.689 kg (184 lb 8 oz)  01/05/16 82.2 kg (181 lb 3.5 oz)      ASSESSMENT AND PLAN:  # Hypertension: Stop HCTZ and continue propranolol.  BP is on the low side today.  Given that this also helps with her palpitations and anxiety, we will not stop propranolol.  # Chest/arm pain:  Non-cardiac chest pain.  Negative stress and normal coronaries on cath.  I agree with GI work up.  # Palpitations: 7 day event monitor did not reveal any abnormalities.  Symptoms improved on propranolol.   30 Minutes of direct patient care were spent with Stacy [name] today.  The patient does not have concerns regarding medicines.  The following changes have been made:  no change  Labs/ tests ordered today include:   No orders of the defined types were placed in this encounter.     Disposition:   FU with Chidera Dearcos C. Oval Linsey, MD in 6 months.   Signed, Stacy Harness, MD  01/18/2016 1:22 PM    [facility]

## 2016-01-18 ENCOUNTER — Encounter: Payer: Self-pay | Admitting: Cardiovascular Disease

## 2016-01-18 ENCOUNTER — Ambulatory Visit (INDEPENDENT_AMBULATORY_CARE_PROVIDER_SITE_OTHER): Payer: BLUE CROSS/BLUE SHIELD | Admitting: Cardiovascular Disease

## 2016-01-18 VITALS — BP 100/76 | HR 72 | Ht 66.0 in | Wt 187.0 lb

## 2016-01-18 DIAGNOSIS — F419 Anxiety disorder, unspecified: Secondary | ICD-10-CM | POA: Diagnosis not present

## 2016-01-18 DIAGNOSIS — R002 Palpitations: Secondary | ICD-10-CM | POA: Diagnosis not present

## 2016-01-18 DIAGNOSIS — I10 Essential (primary) hypertension: Secondary | ICD-10-CM

## 2016-01-18 DIAGNOSIS — R079 Chest pain, unspecified: Secondary | ICD-10-CM

## 2016-01-18 DIAGNOSIS — F439 Reaction to severe stress, unspecified: Secondary | ICD-10-CM

## 2016-01-18 NOTE — Patient Instructions (Signed)
Your physician wants you to follow-up in [place].  You will receive a reminder letter in the mail two months in advance. If you don't receive a letter, please call our office to schedule the follow-up appointment.  STOP HCTZ  If you need a refill on your cardiac medications before your next appointment, please call your pharmacy.

## 2016-01-19 ENCOUNTER — Ambulatory Visit: Payer: Self-pay | Admitting: Cardiovascular Disease

## 2016-01-20 ENCOUNTER — Ambulatory Visit (INDEPENDENT_AMBULATORY_CARE_PROVIDER_SITE_OTHER): Payer: BLUE CROSS/BLUE SHIELD | Admitting: Psychology

## 2016-01-20 DIAGNOSIS — F411 Generalized anxiety disorder: Secondary | ICD-10-CM | POA: Diagnosis not present

## 2016-01-20 DIAGNOSIS — F329 Major depressive disorder, single episode, unspecified: Secondary | ICD-10-CM

## 2016-01-27 ENCOUNTER — Encounter: Payer: Self-pay | Admitting: *Deleted

## 2016-01-28 ENCOUNTER — Ambulatory Visit: Payer: Self-pay | Admitting: Family Medicine

## 2016-02-01 ENCOUNTER — Telehealth: Payer: Self-pay

## 2016-02-01 NOTE — Telephone Encounter (Signed)
Her Bx was normal.  I talked with her about an IUD at her last appt.  She is supposed to make an appt to have one placed.

## 2016-02-01 NOTE — Telephone Encounter (Signed)
Pt would like to know her results of Biopsy on 01/15/2016. Can you please review so I can call the patient?

## 2016-02-01 NOTE — Telephone Encounter (Signed)
Spoke with patient regarding lab results she will follow up regarding her ultrasound

## 2016-02-03 ENCOUNTER — Ambulatory Visit (INDEPENDENT_AMBULATORY_CARE_PROVIDER_SITE_OTHER): Payer: BLUE CROSS/BLUE SHIELD | Admitting: Psychology

## 2016-02-03 DIAGNOSIS — F411 Generalized anxiety disorder: Secondary | ICD-10-CM | POA: Diagnosis not present

## 2016-02-03 DIAGNOSIS — F329 Major depressive disorder, single episode, unspecified: Secondary | ICD-10-CM | POA: Diagnosis not present

## 2016-02-04 ENCOUNTER — Emergency Department (HOSPITAL_COMMUNITY): Payer: BLUE CROSS/BLUE SHIELD

## 2016-02-04 ENCOUNTER — Emergency Department (HOSPITAL_COMMUNITY)
Admission: EM | Admit: 2016-02-04 | Discharge: 2016-02-04 | Disposition: A | Discharge status: Home / Self Care | Payer: BLUE CROSS/BLUE SHIELD | Attending: Emergency Medicine | Admitting: Emergency Medicine

## 2016-02-04 ENCOUNTER — Encounter (HOSPITAL_COMMUNITY): Payer: Self-pay

## 2016-02-04 DIAGNOSIS — Z9889 Other specified postprocedural states: Secondary | ICD-10-CM | POA: Diagnosis not present

## 2016-02-04 DIAGNOSIS — J159 Unspecified bacterial pneumonia: Secondary | ICD-10-CM | POA: Insufficient documentation

## 2016-02-04 DIAGNOSIS — I10 Essential (primary) hypertension: Secondary | ICD-10-CM | POA: Diagnosis not present

## 2016-02-04 DIAGNOSIS — K219 Gastro-esophageal reflux disease without esophagitis: Secondary | ICD-10-CM | POA: Insufficient documentation

## 2016-02-04 DIAGNOSIS — F419 Anxiety disorder, unspecified: Secondary | ICD-10-CM | POA: Insufficient documentation

## 2016-02-04 DIAGNOSIS — R079 Chest pain, unspecified: Secondary | ICD-10-CM | POA: Diagnosis present

## 2016-02-04 DIAGNOSIS — Z79899 Other long term (current) drug therapy: Secondary | ICD-10-CM | POA: Diagnosis not present

## 2016-02-04 DIAGNOSIS — R1013 Epigastric pain: Secondary | ICD-10-CM | POA: Diagnosis not present

## 2016-02-04 DIAGNOSIS — J189 Pneumonia, unspecified organism: Secondary | ICD-10-CM

## 2016-02-04 DIAGNOSIS — Z86018 Personal history of other benign neoplasm: Secondary | ICD-10-CM | POA: Diagnosis not present

## 2016-02-04 DIAGNOSIS — Z862 Personal history of diseases of the blood and blood-forming organs and certain disorders involving the immune mechanism: Secondary | ICD-10-CM | POA: Diagnosis not present

## 2016-02-04 DIAGNOSIS — F329 Major depressive disorder, single episode, unspecified: Secondary | ICD-10-CM | POA: Insufficient documentation

## 2016-02-04 DIAGNOSIS — Z3202 Encounter for pregnancy test, result negative: Secondary | ICD-10-CM | POA: Diagnosis not present

## 2016-02-04 LAB — I-STAT BETA HCG BLOOD, ED (MC, WL, AP ONLY): I-stat hCG, quantitative: <5 m[IU]/mL (ref <5)

## 2016-02-04 LAB — HEPATIC FUNCTION PANEL
ALT: 12 U/L — AB (ref 14–54)
AST: 23 U/L (ref 15–41)
Albumin: 3.6 g/dL (ref 3.5–5.0)
Alkaline Phosphatase: 45 U/L (ref 38–126)
BILIRUBIN DIRECT: 0.3 mg/dL (ref 0.1–0.5)
BILIRUBIN INDIRECT: 0.8 mg/dL (ref 0.3–0.9)
TOTAL PROTEIN: 7.5 g/dL (ref 6.5–8.1)
Total Bilirubin: 1.1 mg/dL (ref 0.3–1.2)

## 2016-02-04 LAB — BASIC METABOLIC PANEL
Anion gap: 9 (ref 5–15)
BUN: 9 mg/dL (ref 6–20)
CHLORIDE: 105 mmol/L (ref 101–111)
CO2: 27 mmol/L (ref 22–32)
Calcium: 8.8 mg/dL — ABNORMAL LOW (ref 8.9–10.3)
Creatinine, Ser: 0.72 mg/dL (ref 0.44–1.00)
GFR calc Af Amer: >60 mL/min (ref >60)
GFR calc non Af Amer: >60 mL/min (ref >60)
Glucose, Bld: 89 mg/dL (ref 65–99)
POTASSIUM: 3.7 mmol/L (ref 3.5–5.1)
Sodium: 141 mmol/L (ref 135–145)

## 2016-02-04 LAB — I-STAT TROPONIN, ED: TROPONIN I, POC: 0 ng/mL (ref 0.00–0.08)

## 2016-02-04 LAB — CBC
HCT: 32.1 % — ABNORMAL LOW (ref 36.0–46.0)
HEMOGLOBIN: 11.4 g/dL — AB (ref 12.0–15.0)
MCH: 28.4 pg (ref 26.0–34.0)
MCHC: 35.5 g/dL (ref 30.0–36.0)
MCV: 80 fL (ref 78.0–100.0)
PLATELETS: 383 10*3/uL (ref 150–400)
RBC: 4.01 MIL/uL (ref 3.87–5.11)
RDW: 13.2 % (ref 11.5–15.5)
WBC: 11.4 10*3/uL — ABNORMAL HIGH (ref 4.0–10.5)

## 2016-02-04 LAB — URINALYSIS, ROUTINE W REFLEX MICROSCOPIC
Bilirubin Urine: NEGATIVE
GLUCOSE, UA: NEGATIVE mg/dL
Ketones, ur: NEGATIVE mg/dL
LEUKOCYTES UA: NEGATIVE
NITRITE: NEGATIVE
PH: 7 (ref 5.0–8.0)
Protein, ur: NEGATIVE mg/dL
SPECIFIC GRAVITY, URINE: 1.02 (ref 1.005–1.030)

## 2016-02-04 LAB — I-STAT CG4 LACTIC ACID, ED
LACTIC ACID, VENOUS: 3.16 mmol/L — AB (ref 0.5–2.0)
Lactic Acid, Venous: 0.49 mmol/L — ABNORMAL LOW (ref 0.5–2.0)

## 2016-02-04 LAB — LIPASE, BLOOD: LIPASE: 25 U/L (ref 11–51)

## 2016-02-04 LAB — URINE MICROSCOPIC-ADD ON

## 2016-02-04 MED ORDER — IOHEXOL 300 MG/ML  SOLN
100.0000 mL | Freq: Once | INTRAMUSCULAR | Status: AC | PRN
  Administered 2016-02-04: 100 mL via INTRAVENOUS

## 2016-02-04 MED ORDER — ACETAMINOPHEN 325 MG PO TABS
650.0000 mg | ORAL_TABLET | Freq: Once | ORAL | Status: AC
  Administered 2016-02-04: 650 mg via ORAL
  Filled 2016-02-04: qty 2

## 2016-02-04 MED ORDER — SODIUM CHLORIDE 0.9 % IV BOLUS (SEPSIS)
1000.0000 mL | Freq: Once | INTRAVENOUS | Status: AC
  Administered 2016-02-04: 1000 mL via INTRAVENOUS

## 2016-02-04 MED ORDER — ONDANSETRON HCL 4 MG/2ML IJ SOLN
4.0000 mg | Freq: Once | INTRAMUSCULAR | Status: AC
  Administered 2016-02-04: 4 mg via INTRAVENOUS
  Filled 2016-02-04: qty 2

## 2016-02-04 MED ORDER — SODIUM CHLORIDE 0.9 % IV BOLUS (SEPSIS)
1000.0000 mL | INTRAVENOUS | Status: AC
  Administered 2016-02-04: 1000 mL via INTRAVENOUS

## 2016-02-04 MED ORDER — GI COCKTAIL ~~LOC~~
30.0000 mL | Freq: Once | ORAL | Status: AC
  Administered 2016-02-04: 30 mL via ORAL
  Filled 2016-02-04: qty 30

## 2016-02-04 MED ORDER — LEVOFLOXACIN 750 MG PO TABS: 750.0000 mg | ORAL_TABLET | Freq: Every day | ORAL | Status: DC

## 2016-02-04 MED ORDER — FENTANYL CITRATE (PF) 100 MCG/2ML IJ SOLN
50.0000 ug | Freq: Once | INTRAMUSCULAR | Status: AC
  Administered 2016-02-04: 50 ug via INTRAVENOUS
  Filled 2016-02-04: qty 2

## 2016-02-04 MED ORDER — PIPERACILLIN-TAZOBACTAM 3.375 G IVPB: 3.3750 g | Freq: Three times a day (TID) | INTRAVENOUS | Status: DC

## 2016-02-04 MED ORDER — PIPERACILLIN-TAZOBACTAM 3.375 G IVPB 30 MIN
3.3750 g | Freq: Once | INTRAVENOUS | Status: AC
  Administered 2016-02-04: 3.375 g via INTRAVENOUS
  Filled 2016-02-04: qty 50

## 2016-02-04 NOTE — Progress Notes (Signed)
Pharmacy Antibiotic Note  Stacy [name] is a 45 y.o. female admitted on 02/04/2016 with intra-abdominal infection.  Pharmacy has been consulted for Zosyn dosing.  Plan: Zosyn 3.375g IV q8h (4 hour infusion). Dosage unlikely to need to be adjusted.  Will sign off    Temp (24hrs), Avg:100 F (37.8 C), Min:100 F (37.8 C), Max:100 F (37.8 C)   Recent Labs Lab 02/04/16 1703 02/04/16 1717  WBC 11.4*  --   LATICACIDVEN  --  3.16*    CrCl cannot be calculated (Unknown ideal weight.).    Allergies  Allergen Reactions  . Hydromorphone Shortness Of Breath and Nausea And Vomiting  . Morphine Nausea And Vomiting  . Diphenhydramine Hives    Adrian Saran, PharmD, BCPS Pager [phone] 02/04/2016 5:35 PM;

## 2016-02-04 NOTE — ED Provider Notes (Signed)
CSN: [id number]     Arrival date & time 02/04/16  1603 History   First MD Initiated Contact with Patient 02/04/16 1639     Chief Complaint  Patient presents with  . Post Procedure Problem   . Chest Pain  . Nausea     (Consider location/radiation/quality/duration/timing/severity/associated sxs/prior Treatment) HPI Stacy [name] is a 45 y.o. female presents to emergency department with complaint of severe epigastric pain, nausea, fever, body aches. Patient reports having endoscopy this morning, states she was feeling well after. She does reports that they did do biopsies of her esophagus and stomach. This was done in [place]. Patient states she was taken to procedure at home, and when woke up she woke up not feeling well. She reports temperature 100.2, chills, severe epigastric pain and chest pain. She states she took some Zofran for nausea and call EMS. She was told to take 4 baby aspirins for her chest pain. Patient reports that she has had recent workup for her chest pain and states that she was hospitalized and had cardiac cath. Records reviewed, normal cardiac cath with normal coronary arteries and normal ventricular function. Patient denies any upper respiratory or flulike symptoms otherwise. Denies vomiting.   Past Medical History  Diagnosis Date  . Esophageal stricture   . Chest pain 09/14/2009    no pulmonary embolus by chest CT  . Palpitations   . Elevated BP   . Anxiety     past Hx  . Depression     past Hx  . GERD (gastroesophageal reflux disease)     no meds  . Sickle cell trait ([place])   . Fibroid   . Abnormal Pap smear     cryo, normal since  . Fibroids 09/24/2015  . Hypertension   . Essential hypertension 09/28/2009    Qualifier: Diagnosis of  By: [name] FNP, [place]    . Arrhythmia   . Vaginal Pap smear, abnormal    Past Surgical History  Procedure Laterality Date  . Cesarean section  2004  . Laparoscopy for ectopic pregnancy  2001  . Wisdom  tooth extraction  2012  . Cardiac catheterization N/A 01/04/2016    Procedure: Left Heart Cath and Coronary Angiography;  Surgeon: Belva Crome, MD;  Location: [facility];  Service: Cardiovascular;  Laterality: N/A;   Family History  Problem Relation Age of Onset  . Asthma Mother   . Hypertension Mother   . Hypertension Paternal Uncle   . Asthma Maternal Grandmother   . Colon cancer Neg Hx   . Esophageal cancer Neg Hx   . Stomach cancer Neg Hx   . Asthma Daughter   . Thyroid disease Mother   . Heart Problems Mother    Social History  Substance Use Topics  . Smoking status: Never Smoker   . Smokeless tobacco: Never Used  . Alcohol Use: No   OB History    Gravida Para Term Preterm AB TAB SAB Ectopic Multiple Living   7 2 2  0 3 0 0 3 0 2     Review of Systems  Constitutional: Negative for fever and chills.  Respiratory: Negative for cough, chest tightness and shortness of breath.   Cardiovascular: Positive for chest pain. Negative for palpitations and leg swelling.  Gastrointestinal: Positive for abdominal pain. Negative for nausea, vomiting and diarrhea.  Genitourinary: Negative for dysuria, flank pain and pelvic pain.  Musculoskeletal: Negative for myalgias, arthralgias, neck pain and neck stiffness.  Skin: Negative for rash.  Neurological: Negative for dizziness, weakness and headaches.  All other systems reviewed and are negative.     Allergies  Hydromorphone; Morphine; and Diphenhydramine  Home Medications   Prior to Admission medications   Medication Sig Start Date End Date Taking? Authorizing Provider  clonazePAM (KLONOPIN) 0.5 MG tablet Take 0.5 mg by mouth daily as needed for anxiety.   Yes Historical Provider, MD  famotidine (PEPCID) 40 MG tablet Take 40 mg by mouth 2 (two) times daily as needed for heartburn or indigestion.   Yes Historical Provider, MD  hydrochlorothiazide (MICROZIDE) 12.5 MG capsule Take 12.5 mg by mouth daily.   Yes Historical  Provider, MD  propranolol (INDERAL) 20 MG tablet Take 10 mg by mouth 2 (two) times daily.   Yes Historical Provider, MD  atorvastatin (LIPITOR) 40 MG tablet Take 1 tablet (40 mg total) by mouth daily at 6 PM. Patient not taking: Reported on 02/04/2016 01/05/16   Almyra Deforest, PA   BP 112/71 mmHg  Pulse 122  Temp(Src) 100 F (37.8 C) (Oral)  Resp 20  SpO2 100%  LMP 12/25/2015 (Approximate) Physical Exam  Constitutional: She is oriented to person, place, and time. She appears well-developed and well-nourished. No distress.  HENT:  Head: Normocephalic.  Right Ear: External ear normal.  Left Ear: External ear normal.  Nose: Nose normal.  Mouth/Throat: Oropharynx is clear and moist. No oropharyngeal exudate.  Eyes: Conjunctivae are normal.  Neck: Neck supple.  No meningismus  Cardiovascular: Normal rate, regular rhythm and normal heart sounds.   Pulmonary/Chest: Effort normal and breath sounds normal. No respiratory distress. She has no wheezes. She has no rales. She exhibits tenderness.  Sternal tenderness  Abdominal: Soft. Bowel sounds are normal. She exhibits no distension. There is tenderness. There is no rebound.  Epigastric tenderness. No guarding  Musculoskeletal: She exhibits no edema.  Neurological: She is alert and oriented to person, place, and time.  Skin: Skin is warm and dry.  Psychiatric: She has a normal mood and affect. Her behavior is normal.  Nursing note and vitals reviewed.   ED Course  Procedures (including critical care time) Labs Review Labs Reviewed  BASIC METABOLIC PANEL - Abnormal; Notable for the following:    Calcium 8.8 (*)    All other components within normal limits  CBC - Abnormal; Notable for the following:    WBC 11.4 (*)    Hemoglobin 11.4 (*)    HCT 32.1 (*)    All other components within normal limits  HEPATIC FUNCTION PANEL - Abnormal; Notable for the following:    ALT 12 (*)    All other components within normal limits  URINALYSIS, ROUTINE  W REFLEX MICROSCOPIC (NOT AT [facility]) - Abnormal; Notable for the following:    Hgb urine dipstick TRACE (*)    All other components within normal limits  URINE MICROSCOPIC-ADD ON - Abnormal; Notable for the following:    Squamous Epithelial / LPF 0-5 (*)    Bacteria, UA RARE (*)    All other components within normal limits  I-STAT CG4 LACTIC ACID, ED - Abnormal; Notable for the following:    Lactic Acid, Venous 3.16 (*)    All other components within normal limits  I-STAT CG4 LACTIC ACID, ED - Abnormal; Notable for the following:    Lactic Acid, Venous 0.49 (*)    All other components within normal limits  CULTURE, BLOOD (ROUTINE X 2)  CULTURE, BLOOD (ROUTINE X 2)  URINE CULTURE  LIPASE, BLOOD  I-STAT TROPOININ,  ED  I-STAT BETA HCG BLOOD, ED ([facility], [facility], [facility] ONLY)    Imaging Review No results found. I have personally reviewed and evaluated these images and lab results as part of my medical decision-making.   EKG Interpretation   Date/Time:  Thursday February 04 2016 16:13:55 EST Ventricular Rate:  119 PR Interval:  138 QRS Duration: 102 QT Interval:  291 QTC Calculation: 409 R Axis:   18 Text Interpretation:  Sinus tachycardia Anteroseptal infarct, age  indeterminate Baseline wander in lead(s) I III aVL no sig change from old  Confirmed by Johnney Killian, MD, Jeannie Done [phone]) on 02/04/2016 4:26:27 PM      MDM   Final diagnoses:  Healthcare-associated pneumonia    5:05 PM Patient seen and examined. Patient post endoscopy today, woke up with a fever 102, tachycardic, worsening epigastric pain radiating into the chest. She took Zofran at home and received aspirin prior to coming in. Patient has temperature of 100 here, she is tachycardic into 120s, she has severe epigastric pain. Concerning for possible perforation versus infectious etiology. Will get labs including a lactic acid, will get plain films, she will need further imaging after. We'll give fentanyl for pain and Zofran for nausea. IV  fluids started.  5:34 PM Patient continues to be tachycardic, otherwise normal vital signs. Her lactic acid came back elevated. Will treat his sepsis at this time. Ordered IV fluids per sepsis protocol and antibiotics for possible intra-abdominal infection. I spoke with radiology, Dr. Owens Shark, about imaging choices, advised CT chest and abdomen and pelvis with IV contrast, plus or minus water-soluble oral contrast.  9:21 PM Patient reassessed several times during her visit. Her CT scan did not show any pneumatosis or extraluminal gas. She does have airspace opacity in the 12 favoring pneumonia on x-ray and CT. Patient denies any respiratory symptoms. Patient's lactic acid level elevated initially at 3.16. White blood cell count while elevated 11.4, otherwise normal lab work. Patient was made cut sepsis after her lactic acid was elevated and she had a fever with tachycardia. Her heart rate improved with IV fluids. His down in the 90s. Her repeat lactic acid is normal at 0.49. She feels much better. She is ambulatory. I think at this time patient is okay to be discharged home, will start on Levaquin for pneumonia, will have her follow up closely with her primary care doctor. Return precautions carefully discussed and she voiced understanding. Patient is comfortable going home.  Filed Vitals:   02/04/16 1830 02/04/16 1902 02/04/16 1927 02/04/16 2105  BP: 116/69  117/73 115/72  Pulse:   110 91  Temp:  100.1 F (37.8 C)  98.4 F (36.9 C)  TempSrc:  Oral  Oral  Resp: 11  18 14   SpO2: 100%  99% 99%      Jeannett Senior, PA-C 02/05/16 0101  Charlesetta Shanks, MD 02/10/16 [phone]

## 2016-02-04 NOTE — ED Notes (Signed)
PT DISCHARGED. INSTRUCTIONS AND PRESCRIPTION GIVEN. AAOX3. PT IN NO APPARENT DISTRESS. THE OPPORTUNITY TO ASK QUESTIONS WAS PROVIDED. 

## 2016-02-04 NOTE — Discharge Instructions (Signed)
Take Tylenol and Motrin for fever, take it throughout the day. Make sure to drink plenty of fluids to prevent dehydration. Continue all your regular medications. Take Levaquin as prescribed until gone for infection. Please follow-up with primary care doctor in 2-3 days for recheck. Return to the hospital if feeling worse.  Community-Acquired Pneumonia, Adult Pneumonia is an infection of the lungs. There are different types of pneumonia. One type can develop while a person is in a hospital. A different type, called community-acquired pneumonia, develops in people who are not, or have not recently been, in the hospital or other health care facility.  CAUSES Pneumonia may be caused by bacteria, viruses, or funguses. Community-acquired pneumonia is often caused by Streptococcus pneumonia bacteria. These bacteria are often passed from one person to another by breathing in droplets from the cough or sneeze of an infected person. RISK FACTORS The condition is more likely to develop in:  People who havechronic diseases, such as chronic obstructive pulmonary disease (COPD), asthma, congestive heart failure, cystic fibrosis, diabetes, or kidney disease.  People who haveearly-stage or late-stage HIV.  People who havesickle cell disease.  People who havehad their spleen removed (splenectomy).  People who havepoor Human resources officer.  People who havemedical conditions that increase the risk of breathing in (aspirating) secretions their own mouth and nose.   People who havea weakened immune system (immunocompromised).  People who smoke.  People whotravel to areas where pneumonia-causing germs commonly exist.  People whoare around animal habitats or animals that have pneumonia-causing germs, including birds, bats, rabbits, cats, and farm animals. SYMPTOMS Symptoms of this condition include:  Adry cough.  A wet (productive) cough.  Fever.  Sweating.  Chest pain, especially when  breathing deeply or coughing.  Rapid breathing or difficulty breathing.  Shortness of breath.  Shaking chills.  Fatigue.  Muscle aches. DIAGNOSIS Your health care provider will take a medical history and perform a physical exam. You may also have other tests, including:  Imaging studies of your chest, including X-rays.  Tests to check your blood oxygen level and other blood gases.  Other tests on blood, mucus (sputum), fluid around your lungs (pleural fluid), and urine. If your pneumonia is severe, other tests may be done to identify the specific cause of your illness. TREATMENT The type of treatment that you receive depends on many factors, such as the cause of your pneumonia, the medicines you take, and other medical conditions that you have. For most adults, treatment and recovery from pneumonia may occur at home. In some cases, treatment must happen in a hospital. Treatment may include:  Antibiotic medicines, if the pneumonia was caused by bacteria.  Antiviral medicines, if the pneumonia was caused by a virus.  Medicines that are given by mouth or through an IV tube.  Oxygen.  Respiratory therapy. Although rare, treating severe pneumonia may include:  Mechanical ventilation. This is done if you are not breathing well on your own and you cannot maintain a safe blood oxygen level.  Thoracentesis. This procedureremoves fluid around one lung or both lungs to help you breathe better. HOME CARE INSTRUCTIONS  Take over-the-counter and prescription medicines only as told by your health care provider.  Only takecough medicine if you are losing sleep. Understand that cough medicine can prevent your body's natural ability to remove mucus from your lungs.  If you were prescribed an antibiotic medicine, take it as told by your health care provider. Do not stop taking the antibiotic even if you start to feel  better.  Sleep in a semi-upright position at night. Try sleeping in a  reclining chair, or place a few pillows under your head.  Do not use tobacco products, including cigarettes, chewing tobacco, and e-cigarettes. If you need help quitting, ask your health care provider.  Drink enough water to keep your urine clear or pale yellow. This will help to thin out mucus secretions in your lungs. PREVENTION There are ways that you can decrease your risk of developing community-acquired pneumonia. Consider getting a pneumococcal vaccine if:  You are older than 43 years of age.  You are older than 43 years of age and are undergoing cancer treatment, have chronic lung disease, or have other medical conditions that affect your immune system. Ask your health care provider if this applies to you. There are different types and schedules of pneumococcal vaccines. Ask your health care provider which vaccination option is best for you. You may also prevent community-acquired pneumonia if you take these actions:  Get an influenza vaccine every year. Ask your health care provider which type of influenza vaccine is best for you.  Go to the dentist on a regular basis.  Wash your hands often. Use hand sanitizer if soap and water are not available. SEEK MEDICAL CARE IF:  You have a fever.  You are losing sleep because you cannot control your cough with cough medicine. SEEK IMMEDIATE MEDICAL CARE IF:  You have worsening shortness of breath.  You have increased chest pain.  Your sickness becomes worse, especially if you are an older adult or have a weakened immune system.  You cough up blood.   This information is not intended to replace advice given to you by your health care provider. Make sure you discuss any questions you have with your health care provider.   Document Released: 11/14/2005 Document Revised: 08/05/2015 Document Reviewed: 03/11/2015 Elsevier Interactive Patient Education [employer].

## 2016-02-04 NOTE — ED Notes (Signed)
Kirichenko, PA made aware of patient lactic result.

## 2016-02-04 NOTE — ED Notes (Signed)
Per EMS, Pt, from home, c/o central chest pain, abdominal pain, fever, and nausea after having an endoscopy this morning.  Pain score 10/10.  Hx of GERD.  Pt took ODT Zofran prior to EMS arrival.

## 2016-02-06 LAB — URINE CULTURE

## 2016-02-09 LAB — CULTURE, BLOOD (ROUTINE X 2)
CULTURE: NO GROWTH
CULTURE: NO GROWTH

## 2016-02-10 ENCOUNTER — Ambulatory Visit: Payer: BLUE CROSS/BLUE SHIELD | Admitting: Psychology

## 2016-02-10 ENCOUNTER — Emergency Department (INDEPENDENT_AMBULATORY_CARE_PROVIDER_SITE_OTHER): Payer: BLUE CROSS/BLUE SHIELD

## 2016-02-10 ENCOUNTER — Encounter (HOSPITAL_COMMUNITY): Payer: Self-pay | Admitting: Emergency Medicine

## 2016-02-10 ENCOUNTER — Emergency Department (INDEPENDENT_AMBULATORY_CARE_PROVIDER_SITE_OTHER)
Admission: EM | Admit: 2016-02-10 | Discharge: 2016-02-10 | Disposition: A | Discharge status: Home / Self Care | Payer: BLUE CROSS/BLUE SHIELD | Source: Home / Self Care | Attending: Emergency Medicine | Admitting: Emergency Medicine

## 2016-02-10 DIAGNOSIS — R091 Pleurisy: Secondary | ICD-10-CM

## 2016-02-10 MED ORDER — PREDNISONE 50 MG PO TABS: ORAL_TABLET | ORAL | Status: DC

## 2016-02-10 NOTE — Discharge Instructions (Signed)
The x-ray shows that your pneumonia is resolving. Your EKG is normal. I suspect the pain and shortness of breath is coming from some pleuritis. This is irritation of the lining of the lung. It is probably from the pneumonia. Take prednisone daily for the next 5 days. Make sure you finish your Levaquin. Follow-up as needed.

## 2016-02-10 NOTE — ED Notes (Signed)
Patient reports having a cardiac cath one month ago and reported as negative or clean.   Patient reports being seen in [facility] last week.  Patient diagnosed with pneumonia.  Patient reports follow up with pcp yesterday and no changes in treatment.   Patient reports having chest pain around 2:00pm today .  Patient has a pulse oximeter at home.  Reports hr 130 per this equipment.

## 2016-02-10 NOTE — ED Provider Notes (Signed)
CSN: [id number]     Arrival date & time 02/10/16  1953 History   First MD Initiated Contact with Patient 02/10/16 2048     Chief Complaint  Patient presents with  . Chest Pain  . Shortness of Breath   (Consider location/radiation/quality/duration/timing/severity/associated sxs/prior Treatment) HPI She is a 45 year old woman here for evaluation of chest pain and shortness of breath. She states she was diagnosed with pneumonia a week ago.  She has been taking Levaquin as prescribed. She had a follow-up with her PCP yesterday and was doing well at that time. This afternoon, around 2:00, she developed some anterior chest discomfort that radiates into her back. She also reports palpitations with a heart rate in the 120s to 130s.  She has felt short of breath. She does have a cough, but feels this is improving overall. She also reports having a cardiac cath done a month ago that was normal.  Past Medical History  Diagnosis Date  . Esophageal stricture   . Chest pain 09/14/2009    no pulmonary embolus by chest CT  . Palpitations   . Elevated BP   . Anxiety     past Hx  . Depression     past Hx  . GERD (gastroesophageal reflux disease)     no meds  . Sickle cell trait ([place])   . Fibroid   . Abnormal Pap smear     cryo, normal since  . Fibroids 09/24/2015  . Hypertension   . Essential hypertension 09/28/2009    Qualifier: Diagnosis of  By: [name] FNP, [place] Hampshire    . Arrhythmia   . Vaginal Pap smear, abnormal    Past Surgical History  Procedure Laterality Date  . Cesarean section  2004  . Laparoscopy for ectopic pregnancy  2001  . Wisdom tooth extraction  2012  . Cardiac catheterization N/A 01/04/2016    Procedure: Left Heart Cath and Coronary Angiography;  Surgeon: Belva Crome, MD;  Location: [facility];  Service: Cardiovascular;  Laterality: N/A;   Family History  Problem Relation Age of Onset  . Asthma Mother   . Hypertension Mother   . Hypertension Paternal Uncle    . Asthma Maternal Grandmother   . Colon cancer Neg Hx   . Esophageal cancer Neg Hx   . Stomach cancer Neg Hx   . Asthma Daughter   . Thyroid disease Mother   . Heart Problems Mother    Social History  Substance Use Topics  . Smoking status: Never Smoker   . Smokeless tobacco: Never Used  . Alcohol Use: No   OB History    Gravida Para Term Preterm AB TAB SAB Ectopic Multiple Living   7 2 2  0 3 0 0 3 0 2     Review of Systems As in history of present illness Allergies  Hydromorphone; Morphine; and Diphenhydramine  Home Medications   Prior to Admission medications   Medication Sig Start Date End Date Taking? Authorizing Provider  clonazePAM (KLONOPIN) 0.5 MG tablet Take 0.5 mg by mouth daily as needed for anxiety.    Historical Provider, MD  famotidine (PEPCID) 40 MG tablet Take 40 mg by mouth 2 (two) times daily as needed for heartburn or indigestion.    Historical Provider, MD  hydrochlorothiazide (MICROZIDE) 12.5 MG capsule Take 12.5 mg by mouth daily.    Historical Provider, MD  levofloxacin (LEVAQUIN) 750 MG tablet Take 1 tablet (750 mg total) by mouth daily. 02/04/16   Jeannett Senior,  PA-C  predniSONE (DELTASONE) 50 MG tablet Take 1 pill daily for 5 days. 02/10/16   Melony Overly, MD  propranolol (INDERAL) 20 MG tablet Take 10 mg by mouth 2 (two) times daily.    Historical Provider, MD   Meds Ordered and Administered this Visit  Medications - No data to display  BP 134/85 mmHg  Pulse 96  Temp(Src) 98.7 F (37.1 C) (Oral)  Resp 18  SpO2 100%  LMP 02/05/2016 No data found.   Physical Exam  Constitutional: She is oriented to person, place, and time. She appears well-developed and well-nourished. No distress.  Neck: Neck supple.  Cardiovascular: Normal rate, regular rhythm and normal heart sounds.   No murmur heard. Pulmonary/Chest: Effort normal and breath sounds normal. No respiratory distress. She has no wheezes. She has no rales. She exhibits tenderness  (sternal, reproduces her pain).  Neurological: She is alert and oriented to person, place, and time.    ED Course  Procedures (including critical care time) ED ECG REPORT   Date: 02/10/2016  Rate: 92  Rhythm: normal sinus rhythm  QRS Axis: normal  Intervals: normal  ST/T Wave abnormalities: normal  Conduction Disutrbances:none  Narrative Interpretation: NSR, normal ekg  Old EKG Reviewed: unchanged  I have personally reviewed the EKG tracing and agree with the computerized printout as noted.  Labs Review Labs Reviewed - No data to display  Imaging Review Dg Chest 2 View  02/10/2016  CLINICAL DATA:  45 year old female with shortness of breath and central chest pain since this afternoon. EXAM: CHEST  2 VIEW COMPARISON:  Chest x-ray 02/04/2016.  Chest CT 02/04/2016. FINDINGS: Mild diffuse peribronchial cuffing. Ill-defined opacity in the left upper lobe, less apparent than the prior examination, compatible with a resolving pneumonia. No new acute consolidative airspace disease. No pleural effusions. No evidence of pulmonary edema. Heart size and mediastinal contours are within normal limits. IMPRESSION: 1. Resolving left upper lobe pneumonia. 2. Mild diffuse peribronchial cuffing suggestive of an acute bronchitis. Electronically Signed   By: Vinnie Langton M.D.   On: 02/10/2016 21:19     MDM   1. Pleuritis    X-ray shows resolving pneumonia.  Does also showed some concern for bronchitis. I suspect her symptoms are coming from some pleuritis. Treat with 5 days of prednisone. She will finish her Levaquin as prescribed. Follow-up as needed.    Melony Overly, MD 02/10/16 2136

## 2016-02-11 ENCOUNTER — Ambulatory Visit: Payer: Self-pay | Admitting: Family Medicine

## 2016-02-16 ENCOUNTER — Encounter (HOSPITAL_BASED_OUTPATIENT_CLINIC_OR_DEPARTMENT_OTHER): Payer: Self-pay | Admitting: *Deleted

## 2016-02-16 ENCOUNTER — Emergency Department (HOSPITAL_BASED_OUTPATIENT_CLINIC_OR_DEPARTMENT_OTHER)
Admission: EM | Admit: 2016-02-16 | Discharge: 2016-02-16 | Disposition: A | Discharge status: Home / Self Care | Payer: BLUE CROSS/BLUE SHIELD | Attending: Emergency Medicine | Admitting: Emergency Medicine

## 2016-02-16 ENCOUNTER — Emergency Department (HOSPITAL_BASED_OUTPATIENT_CLINIC_OR_DEPARTMENT_OTHER): Payer: BLUE CROSS/BLUE SHIELD

## 2016-02-16 DIAGNOSIS — Z86018 Personal history of other benign neoplasm: Secondary | ICD-10-CM | POA: Insufficient documentation

## 2016-02-16 DIAGNOSIS — R079 Chest pain, unspecified: Secondary | ICD-10-CM

## 2016-02-16 DIAGNOSIS — Z862 Personal history of diseases of the blood and blood-forming organs and certain disorders involving the immune mechanism: Secondary | ICD-10-CM | POA: Insufficient documentation

## 2016-02-16 DIAGNOSIS — R0602 Shortness of breath: Secondary | ICD-10-CM

## 2016-02-16 DIAGNOSIS — Z9889 Other specified postprocedural states: Secondary | ICD-10-CM | POA: Diagnosis not present

## 2016-02-16 DIAGNOSIS — F419 Anxiety disorder, unspecified: Secondary | ICD-10-CM | POA: Insufficient documentation

## 2016-02-16 DIAGNOSIS — Z79899 Other long term (current) drug therapy: Secondary | ICD-10-CM | POA: Diagnosis not present

## 2016-02-16 DIAGNOSIS — K219 Gastro-esophageal reflux disease without esophagitis: Secondary | ICD-10-CM | POA: Diagnosis not present

## 2016-02-16 DIAGNOSIS — I10 Essential (primary) hypertension: Secondary | ICD-10-CM | POA: Insufficient documentation

## 2016-02-16 DIAGNOSIS — Z8701 Personal history of pneumonia (recurrent): Secondary | ICD-10-CM | POA: Insufficient documentation

## 2016-02-16 DIAGNOSIS — F329 Major depressive disorder, single episode, unspecified: Secondary | ICD-10-CM | POA: Insufficient documentation

## 2016-02-16 HISTORY — DX: Pneumonia, unspecified organism: J18.9

## 2016-02-16 LAB — CBC
HCT: 31.7 % — ABNORMAL LOW (ref 36.0–46.0)
HEMOGLOBIN: 11.2 g/dL — AB (ref 12.0–15.0)
MCH: 28.6 pg (ref 26.0–34.0)
MCHC: 35.3 g/dL (ref 30.0–36.0)
MCV: 80.9 fL (ref 78.0–100.0)
Platelets: 429 10*3/uL — ABNORMAL HIGH (ref 150–400)
RBC: 3.92 MIL/uL (ref 3.87–5.11)
RDW: 13 % (ref 11.5–15.5)
WBC: 10.1 10*3/uL (ref 4.0–10.5)

## 2016-02-16 LAB — BASIC METABOLIC PANEL
Anion gap: 7 (ref 5–15)
BUN: 10 mg/dL (ref 6–20)
CALCIUM: 8.9 mg/dL (ref 8.9–10.3)
CHLORIDE: 102 mmol/L (ref 101–111)
CO2: 30 mmol/L (ref 22–32)
CREATININE: 0.77 mg/dL (ref 0.44–1.00)
GFR calc non Af Amer: >60 mL/min (ref >60)
Glucose, Bld: 91 mg/dL (ref 65–99)
Potassium: 2.9 mmol/L — ABNORMAL LOW (ref 3.5–5.1)
SODIUM: 139 mmol/L (ref 135–145)

## 2016-02-16 LAB — TROPONIN I: Troponin I: <0.03 ng/mL (ref <0.031)

## 2016-02-16 MED ORDER — GI COCKTAIL ~~LOC~~
30.0000 mL | Freq: Once | ORAL | Status: AC
  Administered 2016-02-16: 30 mL via ORAL
  Filled 2016-02-16: qty 30

## 2016-02-16 NOTE — Discharge Instructions (Signed)
Nonspecific Chest Pain  °Chest pain can be caused by many different conditions. There is always a chance that your pain could be related to something serious, such as a heart attack or a blood clot in your lungs. Chest pain can also be caused by conditions that are not life-threatening. If you have chest pain, it is very important to follow up with your health care provider. °CAUSES  °Chest pain can be caused by: °· Heartburn. °· Pneumonia or bronchitis. °· Anxiety or stress. °· Inflammation around your heart (pericarditis) or lung (pleuritis or pleurisy). °· A blood clot in your lung. °· A collapsed lung (pneumothorax). It can develop suddenly on its own (spontaneous pneumothorax) or from trauma to the chest. °· Shingles infection (varicella-zoster virus). °· Heart attack. °· Damage to the bones, muscles, and cartilage that make up your chest wall. This can include: °¨ Bruised bones due to injury. °¨ Strained muscles or cartilage due to frequent or repeated coughing or overwork. °¨ Fracture to one or more ribs. °¨ Sore cartilage due to inflammation (costochondritis). °RISK FACTORS  °Risk factors for chest pain may include: °· Activities that increase your risk for trauma or injury to your chest. °· Respiratory infections or conditions that cause frequent coughing. °· Medical conditions or overeating that can cause heartburn. °· Heart disease or family history of heart disease. °· Conditions or health behaviors that increase your risk of developing a blood clot. °· Having had chicken pox (varicella zoster). °SIGNS AND SYMPTOMS °Chest pain can feel like: °· Burning or tingling on the surface of your chest or deep in your chest. °· Crushing, pressure, aching, or squeezing pain. °· Dull or sharp pain that is worse when you move, cough, or take a deep breath. °· Pain that is also felt in your back, neck, shoulder, or arm, or pain that spreads to any of these areas. °Your chest pain may come and go, or it may stay  constant. °DIAGNOSIS °Lab tests or other studies may be needed to find the cause of your pain. Your health care provider may have you take a test called an ambulatory ECG (electrocardiogram). An ECG records your heartbeat patterns at the time the test is performed. You may also have other tests, such as: °· Transthoracic echocardiogram (TTE). During echocardiography, sound waves are used to create a picture of all of the heart structures and to look at how blood flows through your heart. °· Transesophageal echocardiogram (TEE). This is a more advanced imaging test that obtains images from inside your body. It allows your health care provider to see your heart in finer detail. °· Cardiac monitoring. This allows your health care provider to monitor your heart rate and rhythm in real time. °· Holter monitor. This is a portable device that records your heartbeat and can help to diagnose abnormal heartbeats. It allows your health care provider to track your heart activity for several days, if needed. °· Stress tests. These can be done through exercise or by taking medicine that makes your heart beat more quickly. °· Blood tests. °· Imaging tests. °TREATMENT  °Your treatment depends on what is causing your chest pain. Treatment may include: °· Medicines. These may include: °¨ Acid blockers for heartburn. °¨ Anti-inflammatory medicine. °¨ Pain medicine for inflammatory conditions. °¨ Antibiotic medicine, if an infection is present. °¨ Medicines to dissolve blood clots. °¨ Medicines to treat coronary artery disease. °· Supportive care for conditions that do not require medicines. This may include: °¨ Resting. °¨ Applying heat   or cold packs to injured areas. °¨ Limiting activities until pain decreases. °HOME CARE INSTRUCTIONS °· If you were prescribed an antibiotic medicine, finish it all even if you start to feel better. °· Avoid any activities that bring on chest pain. °· Do not use any tobacco products, including  cigarettes, chewing tobacco, or electronic cigarettes. If you need help quitting, ask your health care provider. °· Do not drink alcohol. °· Take medicines only as directed by your health care provider. °· Keep all follow-up visits as directed by your health care provider. This is important. This includes any further testing if your chest pain does not go away. °· If heartburn is the cause for your chest pain, you may be told to keep your head raised (elevated) while sleeping. This reduces the chance that acid will go from your stomach into your esophagus. °· Make lifestyle changes as directed by your health care provider. These may include: °¨ Getting regular exercise. Ask your health care provider to suggest some activities that are safe for you. °¨ Eating a heart-healthy diet. A registered dietitian can help you to learn healthy eating options. °¨ Maintaining a healthy weight. °¨ Managing diabetes, if necessary. °¨ Reducing stress. °SEEK MEDICAL CARE IF: °· Your chest pain does not go away after treatment. °· You have a rash with blisters on your chest. °· You have a fever. °SEEK IMMEDIATE MEDICAL CARE IF:  °· Your chest pain is worse. °· You have an increasing cough, or you cough up blood. °· You have severe abdominal pain. °· You have severe weakness. °· You faint. °· You have chills. °· You have sudden, unexplained chest discomfort. °· You have sudden, unexplained discomfort in your arms, back, neck, or jaw. °· You have shortness of breath at any time. °· You suddenly start to sweat, or your skin gets clammy. °· You feel nauseous or you vomit. °· You suddenly feel light-headed or dizzy. °· Your heart begins to beat quickly, or it feels like it is skipping beats. °These symptoms may represent a serious problem that is an emergency. Do not wait to see if the symptoms will go away. Get medical help right away. Call your local emergency services (911 in the [place] Do not drive yourself to the hospital. °  °This  information is not intended to replace advice given to you by your health care provider. Make sure you discuss any questions you have with your health care provider. °  °Document Released: 08/24/2005 Document Revised: 12/05/2014 Document Reviewed: 06/20/2014 °Elsevier Interactive Patient Education ©2016 [employer] ° °

## 2016-02-16 NOTE — ED Provider Notes (Signed)
CSN: [id number]     Arrival date & time 02/16/16  [id number] History   First MD Initiated Contact with Patient 02/16/16 0845     No chief complaint on file.     HPI Patient was recently treated for pneumonia 2 weeks ago.  She presents to the emergency department with complaints of mild shortness of breath and a tightness sensation in her chest since last night.  This discomfort is been constant.  No prior history of DVT or pulmonary embolism.  Last night she checked her pulse ox and heart rate and noted that her pulse ox was normal but her heart rate was elevated to 120.  She reports no palpitations at this time but continues to feel some ongoing tightness in her chest.  No fevers or chills.  No productive cough.  Denies orthopnea.  Recent heart catheter demonstrated normal coronary arteries.  She does carry diagnosis of gastroesophageal reflux disease.She is on Pepcid for this.  She's had an endoscopy within the past 6 months which was without pathology.   Past Medical History  Diagnosis Date  . Esophageal stricture   . Chest pain 09/14/2009    no pulmonary embolus by chest CT  . Palpitations   . Elevated BP   . Anxiety     past Hx  . Depression     past Hx  . GERD (gastroesophageal reflux disease)     no meds  . Sickle cell trait ([place])   . Fibroid   . Abnormal Pap smear     cryo, normal since  . Fibroids 09/24/2015  . Hypertension   . Essential hypertension 09/28/2009    Qualifier: Diagnosis of  By: [name] FNP, [place]    . Arrhythmia   . Vaginal Pap smear, abnormal   . Pneumonia    Past Surgical History  Procedure Laterality Date  . Cesarean section  2004  . Laparoscopy for ectopic pregnancy  2001  . Wisdom tooth extraction  2012  . Cardiac catheterization N/A 01/04/2016    Procedure: Left Heart Cath and Coronary Angiography;  Surgeon: Belva Crome, MD;  Location: [facility];  Service: Cardiovascular;  Laterality: N/A;   Family History  Problem Relation Age of  Onset  . Asthma Mother   . Hypertension Mother   . Hypertension Paternal Uncle   . Asthma Maternal Grandmother   . Colon cancer Neg Hx   . Esophageal cancer Neg Hx   . Stomach cancer Neg Hx   . Asthma Daughter   . Thyroid disease Mother   . Heart Problems Mother    Social History  Substance Use Topics  . Smoking status: Never Smoker   . Smokeless tobacco: Never Used  . Alcohol Use: No   OB History    Gravida Para Term Preterm AB TAB SAB Ectopic Multiple Living   7 2 2  0 3 0 0 3 0 2     Review of Systems  All other systems reviewed and are negative.     Allergies  Hydromorphone; Morphine; and Diphenhydramine  Home Medications   Prior to Admission medications   Medication Sig Start Date End Date Taking? Authorizing Provider  clonazePAM (KLONOPIN) 0.5 MG tablet Take 0.5 mg by mouth daily as needed for anxiety.   Yes Historical Provider, MD  famotidine (PEPCID) 40 MG tablet Take 40 mg by mouth 2 (two) times daily as needed for heartburn or indigestion.   Yes Historical Provider, MD  hydrochlorothiazide (MICROZIDE) 12.5 MG capsule  Take 12.5 mg by mouth daily.   Yes Historical Provider, MD  propranolol (INDERAL) 20 MG tablet Take 10 mg by mouth 2 (two) times daily.   Yes Historical Provider, MD   BP 123/85 mmHg  Pulse 66  Temp(Src) 98.7 F (37.1 C) (Oral)  Resp 18  Ht 5\' 6"  (1.676 m)  Wt 185 lb (83.915 kg)  BMI 29.87 kg/m2  SpO2 100%  LMP 02/01/2016 Physical Exam  Constitutional: She is oriented to person, place, and time. She appears well-developed and well-nourished. No distress.  HENT:  Head: Normocephalic and atraumatic.  Eyes: EOM are normal.  Neck: Normal range of motion.  Cardiovascular: Normal rate, regular rhythm and normal heart sounds.   Pulmonary/Chest: Effort normal and breath sounds normal.  Abdominal: Soft. She exhibits no distension. There is no tenderness.  Musculoskeletal: Normal range of motion.  Neurological: She is alert and oriented to  person, place, and time.  Skin: Skin is warm and dry.  Psychiatric: She has a normal mood and affect. Judgment normal.  Nursing note and vitals reviewed.   ED Course  Procedures (including critical care time) Labs Review Labs Reviewed  CBC - Abnormal; Notable for the following:    Hemoglobin 11.2 (*)    HCT 31.7 (*)    Platelets 429 (*)    All other components within normal limits  BASIC METABOLIC PANEL - Abnormal; Notable for the following:    Potassium 2.9 (*)    All other components within normal limits  TROPONIN I    Imaging Review Dg Chest 2 View  02/16/2016  CLINICAL DATA:  Recent pneumonia. Current chest pain and shortness of breath EXAM: CHEST  2 VIEW COMPARISON:  February 10, 2016 FINDINGS: Lungs are clear. Heart size and pulmonary vascularity are normal. No adenopathy. No bone lesions. Note that there is been interval clearing of infiltrate from the left mid lung compared to recent prior study. IMPRESSION: Lungs currently clear.  No change in cardiac silhouette. Electronically Signed   By: Lowella Grip III M.D.   On: 02/16/2016 09:13   I have personally reviewed and evaluated these images and lab results as part of my medical decision-making.   EKG Interpretation   Date/Time:  Tuesday February 16 2016 09:09:19 EDT Ventricular Rate:  66 PR Interval:  150 QRS Duration: 107 QT Interval:  398 QTC Calculation: 417 R Axis:   42 Text Interpretation:  Sinus rhythm No acute changes Confirmed by Kitzia Camus   MD, Bryannah Boston ([id number]) on 02/16/2016 9:12:58 AM      MDM   Final diagnoses:  None      Patient is overall well-appearing.  Doubt PE.  Normal heart catheter within the past month.  Normal prior endoscopy.  Some of this could represent esophageal spasm.  Primary care follow-up.  No indication for additional testing or management in the ER.  No indication for admission to the hospital.  Patient understands to return to the emergency department for new or worsening symptoms.   This is a bit of a recurrent issue for the patient.  Jola Schmidt, MD 02/16/16 1027

## 2016-02-16 NOTE — ED Notes (Signed)
States she was dx of pneumonia at [facility] 2 weeks ago. States she was feeling better. States yesterday she was feeling sob and tightness in mid chest. Felt rapid hrt rate yesterday and fatigued.

## 2016-02-22 DIAGNOSIS — F418 Other specified anxiety disorders: Secondary | ICD-10-CM | POA: Insufficient documentation

## 2016-02-22 DIAGNOSIS — D219 Benign neoplasm of connective and other soft tissue, unspecified: Secondary | ICD-10-CM | POA: Insufficient documentation

## 2016-02-22 DIAGNOSIS — R000 Tachycardia, unspecified: Secondary | ICD-10-CM | POA: Insufficient documentation

## 2016-02-22 DIAGNOSIS — K219 Gastro-esophageal reflux disease without esophagitis: Secondary | ICD-10-CM | POA: Insufficient documentation

## 2016-02-22 DIAGNOSIS — N939 Abnormal uterine and vaginal bleeding, unspecified: Secondary | ICD-10-CM | POA: Insufficient documentation

## 2016-02-24 ENCOUNTER — Other Ambulatory Visit: Payer: Self-pay | Admitting: Obstetrics & Gynecology

## 2016-02-24 DIAGNOSIS — Z1231 Encounter for screening mammogram for malignant neoplasm of breast: Secondary | ICD-10-CM

## 2016-02-25 ENCOUNTER — Encounter (HOSPITAL_COMMUNITY): Payer: Self-pay | Admitting: *Deleted

## 2016-02-25 ENCOUNTER — Emergency Department (HOSPITAL_COMMUNITY)
Admission: EM | Admit: 2016-02-25 | Discharge: 2016-02-25 | Disposition: A | Discharge status: Home / Self Care | Payer: BLUE CROSS/BLUE SHIELD | Attending: Emergency Medicine | Admitting: Emergency Medicine

## 2016-02-25 ENCOUNTER — Emergency Department (HOSPITAL_COMMUNITY): Payer: BLUE CROSS/BLUE SHIELD

## 2016-02-25 DIAGNOSIS — Z9889 Other specified postprocedural states: Secondary | ICD-10-CM | POA: Diagnosis not present

## 2016-02-25 DIAGNOSIS — Z8701 Personal history of pneumonia (recurrent): Secondary | ICD-10-CM | POA: Insufficient documentation

## 2016-02-25 DIAGNOSIS — F419 Anxiety disorder, unspecified: Secondary | ICD-10-CM | POA: Insufficient documentation

## 2016-02-25 DIAGNOSIS — Z862 Personal history of diseases of the blood and blood-forming organs and certain disorders involving the immune mechanism: Secondary | ICD-10-CM | POA: Insufficient documentation

## 2016-02-25 DIAGNOSIS — Z79899 Other long term (current) drug therapy: Secondary | ICD-10-CM | POA: Insufficient documentation

## 2016-02-25 DIAGNOSIS — K219 Gastro-esophageal reflux disease without esophagitis: Secondary | ICD-10-CM | POA: Diagnosis not present

## 2016-02-25 DIAGNOSIS — Z86018 Personal history of other benign neoplasm: Secondary | ICD-10-CM | POA: Insufficient documentation

## 2016-02-25 DIAGNOSIS — R079 Chest pain, unspecified: Secondary | ICD-10-CM | POA: Diagnosis present

## 2016-02-25 DIAGNOSIS — I10 Essential (primary) hypertension: Secondary | ICD-10-CM | POA: Insufficient documentation

## 2016-02-25 DIAGNOSIS — F329 Major depressive disorder, single episode, unspecified: Secondary | ICD-10-CM | POA: Diagnosis not present

## 2016-02-25 LAB — BASIC METABOLIC PANEL
Anion gap: 9 (ref 5–15)
BUN: 9 mg/dL (ref 6–20)
CALCIUM: 9.2 mg/dL (ref 8.9–10.3)
CHLORIDE: 104 mmol/L (ref 101–111)
CO2: 25 mmol/L (ref 22–32)
CREATININE: 0.74 mg/dL (ref 0.44–1.00)
GFR calc non Af Amer: >60 mL/min (ref >60)
Glucose, Bld: 104 mg/dL — ABNORMAL HIGH (ref 65–99)
Potassium: 3.6 mmol/L (ref 3.5–5.1)
SODIUM: 138 mmol/L (ref 135–145)

## 2016-02-25 LAB — I-STAT TROPONIN, ED: TROPONIN I, POC: 0 ng/mL (ref 0.00–0.08)

## 2016-02-25 LAB — CBC
HCT: 31.7 % — ABNORMAL LOW (ref 36.0–46.0)
Hemoglobin: 11.4 g/dL — ABNORMAL LOW (ref 12.0–15.0)
MCH: 28.8 pg (ref 26.0–34.0)
MCHC: 36 g/dL (ref 30.0–36.0)
MCV: 80.1 fL (ref 78.0–100.0)
PLATELETS: 383 10*3/uL (ref 150–400)
RBC: 3.96 MIL/uL (ref 3.87–5.11)
RDW: 13.5 % (ref 11.5–15.5)
WBC: 8.3 10*3/uL (ref 4.0–10.5)

## 2016-02-25 NOTE — Discharge Instructions (Signed)
Nonspecific Chest Pain  °Chest pain can be caused by many different conditions. There is always a chance that your pain could be related to something serious, such as a heart attack or a blood clot in your lungs. Chest pain can also be caused by conditions that are not life-threatening. If you have chest pain, it is very important to follow up with your health care provider. °CAUSES  °Chest pain can be caused by: °· Heartburn. °· Pneumonia or bronchitis. °· Anxiety or stress. °· Inflammation around your heart (pericarditis) or lung (pleuritis or pleurisy). °· A blood clot in your lung. °· A collapsed lung (pneumothorax). It can develop suddenly on its own (spontaneous pneumothorax) or from trauma to the chest. °· Shingles infection (varicella-zoster virus). °· Heart attack. °· Damage to the bones, muscles, and cartilage that make up your chest wall. This can include: °¨ Bruised bones due to injury. °¨ Strained muscles or cartilage due to frequent or repeated coughing or overwork. °¨ Fracture to one or more ribs. °¨ Sore cartilage due to inflammation (costochondritis). °RISK FACTORS  °Risk factors for chest pain may include: °· Activities that increase your risk for trauma or injury to your chest. °· Respiratory infections or conditions that cause frequent coughing. °· Medical conditions or overeating that can cause heartburn. °· Heart disease or family history of heart disease. °· Conditions or health behaviors that increase your risk of developing a blood clot. °· Having had chicken pox (varicella zoster). °SIGNS AND SYMPTOMS °Chest pain can feel like: °· Burning or tingling on the surface of your chest or deep in your chest. °· Crushing, pressure, aching, or squeezing pain. °· Dull or sharp pain that is worse when you move, cough, or take a deep breath. °· Pain that is also felt in your back, neck, shoulder, or arm, or pain that spreads to any of these areas. °Your chest pain may come and go, or it may stay  constant. °DIAGNOSIS °Lab tests or other studies may be needed to find the cause of your pain. Your health care provider may have you take a test called an ambulatory ECG (electrocardiogram). An ECG records your heartbeat patterns at the time the test is performed. You may also have other tests, such as: °· Transthoracic echocardiogram (TTE). During echocardiography, sound waves are used to create a picture of all of the heart structures and to look at how blood flows through your heart. °· Transesophageal echocardiogram (TEE). This is a more advanced imaging test that obtains images from inside your body. It allows your health care provider to see your heart in finer detail. °· Cardiac monitoring. This allows your health care provider to monitor your heart rate and rhythm in real time. °· Holter monitor. This is a portable device that records your heartbeat and can help to diagnose abnormal heartbeats. It allows your health care provider to track your heart activity for several days, if needed. °· Stress tests. These can be done through exercise or by taking medicine that makes your heart beat more quickly. °· Blood tests. °· Imaging tests. °TREATMENT  °Your treatment depends on what is causing your chest pain. Treatment may include: °· Medicines. These may include: °¨ Acid blockers for heartburn. °¨ Anti-inflammatory medicine. °¨ Pain medicine for inflammatory conditions. °¨ Antibiotic medicine, if an infection is present. °¨ Medicines to dissolve blood clots. °¨ Medicines to treat coronary artery disease. °· Supportive care for conditions that do not require medicines. This may include: °¨ Resting. °¨ Applying heat   or cold packs to injured areas. °¨ Limiting activities until pain decreases. °HOME CARE INSTRUCTIONS °· If you were prescribed an antibiotic medicine, finish it all even if you start to feel better. °· Avoid any activities that bring on chest pain. °· Do not use any tobacco products, including  cigarettes, chewing tobacco, or electronic cigarettes. If you need help quitting, ask your health care provider. °· Do not drink alcohol. °· Take medicines only as directed by your health care provider. °· Keep all follow-up visits as directed by your health care provider. This is important. This includes any further testing if your chest pain does not go away. °· If heartburn is the cause for your chest pain, you may be told to keep your head raised (elevated) while sleeping. This reduces the chance that acid will go from your stomach into your esophagus. °· Make lifestyle changes as directed by your health care provider. These may include: °¨ Getting regular exercise. Ask your health care provider to suggest some activities that are safe for you. °¨ Eating a heart-healthy diet. A registered dietitian can help you to learn healthy eating options. °¨ Maintaining a healthy weight. °¨ Managing diabetes, if necessary. °¨ Reducing stress. °SEEK MEDICAL CARE IF: °· Your chest pain does not go away after treatment. °· You have a rash with blisters on your chest. °· You have a fever. °SEEK IMMEDIATE MEDICAL CARE IF:  °· Your chest pain is worse. °· You have an increasing cough, or you cough up blood. °· You have severe abdominal pain. °· You have severe weakness. °· You faint. °· You have chills. °· You have sudden, unexplained chest discomfort. °· You have sudden, unexplained discomfort in your arms, back, neck, or jaw. °· You have shortness of breath at any time. °· You suddenly start to sweat, or your skin gets clammy. °· You feel nauseous or you vomit. °· You suddenly feel light-headed or dizzy. °· Your heart begins to beat quickly, or it feels like it is skipping beats. °These symptoms may represent a serious problem that is an emergency. Do not wait to see if the symptoms will go away. Get medical help right away. Call your local emergency services (911 in the [place] Do not drive yourself to the hospital. °  °This  information is not intended to replace advice given to you by your health care provider. Make sure you discuss any questions you have with your health care provider. °  °Document Released: 08/24/2005 Document Revised: 12/05/2014 Document Reviewed: 06/20/2014 °Elsevier Interactive Patient Education ©2016 [employer] ° °

## 2016-02-25 NOTE — ED Provider Notes (Signed)
CSN: [id number]     Arrival date & time 02/25/16  0402 History   First MD Initiated Contact with Patient 02/25/16 0440     Chief Complaint  Patient presents with  . Chest Pain      HPI Patient presents to the emergency department with complaints of chest pain which began approximately 30-45 minutes ago.  She then began to notice that both of her legs felt heavy and weak.  She has a long-standing history of recurrent chest pain as well.  She's had multiple ER evaluations and did have a heart catheterization 2 months ago which demonstrated no obstructive coronary artery disease.  She reports that her chest pain is resolved this time.  No preceding palpitations.  Denies shortness of breath.  No history DVT or pulmonary embolism.  In regards to her leg weakness it feels better at this time.  She's been able to ambulate.  Her blood pressure was elevated when she was at home to 169/93 this also concerned her.  Her blood pressure in the emergency department has normalized.   Past Medical History  Diagnosis Date  . Esophageal stricture   . Chest pain 09/14/2009    no pulmonary embolus by chest CT  . Palpitations   . Elevated BP   . Anxiety     past Hx  . Depression     past Hx  . GERD (gastroesophageal reflux disease)     no meds  . Sickle cell trait ([place])   . Fibroid   . Abnormal Pap smear     cryo, normal since  . Fibroids 09/24/2015  . Hypertension   . Essential hypertension 09/28/2009    Qualifier: Diagnosis of  By: [name] FNP, [place]    . Arrhythmia   . Vaginal Pap smear, abnormal   . Pneumonia    Past Surgical History  Procedure Laterality Date  . Cesarean section  2004  . Laparoscopy for ectopic pregnancy  2001  . Wisdom tooth extraction  2012  . Cardiac catheterization N/A 01/04/2016    Procedure: Left Heart Cath and Coronary Angiography;  Surgeon: Belva Crome, MD;  Location: [facility];  Service: Cardiovascular;  Laterality: N/A;   Family History  Problem  Relation Age of Onset  . Asthma Mother   . Hypertension Mother   . Hypertension Paternal Uncle   . Asthma Maternal Grandmother   . Colon cancer Neg Hx   . Esophageal cancer Neg Hx   . Stomach cancer Neg Hx   . Asthma Daughter   . Thyroid disease Mother   . Heart Problems Mother    Social History  Substance Use Topics  . Smoking status: Never Smoker   . Smokeless tobacco: Never Used  . Alcohol Use: No   OB History    Gravida Para Term Preterm AB TAB SAB Ectopic Multiple Living   7 2 2  0 3 0 0 3 0 2     Review of Systems  All other systems reviewed and are negative.     Allergies  Hydromorphone; Morphine; and Diphenhydramine  Home Medications   Prior to Admission medications   Medication Sig Start Date End Date Taking? Authorizing Provider  clonazePAM (KLONOPIN) 0.5 MG tablet Take 0.5 mg by mouth daily as needed for anxiety.   Yes Historical Provider, MD  hydrochlorothiazide (MICROZIDE) 12.5 MG capsule Take 12.5 mg by mouth daily.   Yes Historical Provider, MD  pantoprazole (PROTONIX) 40 MG tablet Take 40 mg by mouth  2 (two) times daily.   Yes Historical Provider, MD  propranolol (INDERAL) 20 MG tablet Take 10 mg by mouth 2 (two) times daily.   Yes Historical Provider, MD  Vitamin D, Ergocalciferol, (DRISDOL) 50000 units CAPS capsule Take 50,000 Units by mouth every 7 (seven) days.   Yes Historical Provider, MD   BP 105/67 mmHg  Pulse 77  Temp(Src) 98.6 F (37 C) (Oral)  Resp 18  Ht 5\' 5"  (1.651 m)  Wt 186 lb (84.369 kg)  BMI 30.95 kg/m2  SpO2 100%  LMP 02/01/2016 Physical Exam  Constitutional: She is oriented to person, place, and time. She appears well-developed and well-nourished. No distress.  HENT:  Head: Normocephalic and atraumatic.  Eyes: EOM are normal.  Neck: Normal range of motion.  Cardiovascular: Normal rate, regular rhythm and normal heart sounds.   Pulmonary/Chest: Effort normal and breath sounds normal.  Abdominal: Soft. She exhibits no  distension. There is no tenderness.  Musculoskeletal: Normal range of motion.  Neurological: She is alert and oriented to person, place, and time.  Skin: Skin is warm and dry.  Psychiatric:  Anxious  Nursing note and vitals reviewed.   ED Course  Procedures (including critical care time) Labs Review Labs Reviewed  BASIC METABOLIC PANEL - Abnormal; Notable for the following:    Glucose, Bld 104 (*)    All other components within normal limits  CBC - Abnormal; Notable for the following:    Hemoglobin 11.4 (*)    HCT 31.7 (*)    All other components within normal limits  I-STAT TROPOININ, ED    Imaging Review Dg Chest 2 View  02/25/2016  CLINICAL DATA:  Acute onset of upper mid chest pain. Initial encounter. EXAM: CHEST  2 VIEW COMPARISON:  Chest radiograph performed 02/16/2016 FINDINGS: The lungs are well-aerated and clear. There is no evidence of focal opacification, pleural effusion or pneumothorax. The heart is normal in size; the mediastinal contour is within normal limits. No acute osseous abnormalities are seen. IMPRESSION: No acute cardiopulmonary process seen. Electronically Signed   By: Garald Balding M.D.   On: 02/25/2016 04:28   I have personally reviewed and evaluated these images and lab results as part of my medical decision-making.   EKG Interpretation   Date/Time:  Thursday February 25 2016 04:10:54 EDT Ventricular Rate:  89 PR Interval:  136 QRS Duration: 94 QT Interval:  353 QTC Calculation: 429 R Axis:   47 Text Interpretation:  Sinus rhythm RSR' in V1 or V2, right VCD or RVH No  old tracing to compare Confirmed by Oralee Rapaport  MD, Anabell Swint ([id number]) on 02/25/2016  4:41:52 AM      MDM   Final diagnoses:  Chest pain, unspecified chest pain type    Overall the patient is well-appearing.  Doubt ACS.  Doubt PE.  EKG, labs, chest x-ray without abnormality.  Discharge home in good condition.  Primary care follow-up.  I did see her recently in a different emergency  department.  At that time she told me she was when have an upper GI.  She states she had a recent endoscopy which demonstrated gastritis and esophagitis.  She is on twice a day Protonix.  She is scheduled to see GI again for follow-up.  During my last encounter I also recommended a therapist.  She states she is seeing a therapist at this time and doesn't to be helping with her anxiety.    Jola Schmidt, MD 02/25/16 [phone]

## 2016-02-25 NOTE — ED Notes (Signed)
Patient transported to X-ray 

## 2016-02-25 NOTE — ED Notes (Addendum)
Pt states that she woke up and when she stood up her legs felt very heavy and weak. She checked her blood pressure and it was (169/93) and hr was 130. She states about 30 minutes ago she started having pain in her chest and in her right arm. Pt has had similar pain previously, heart catherization about 8 weeks ago was negative

## 2016-03-04 ENCOUNTER — Ambulatory Visit: Payer: Self-pay | Admitting: Family Medicine

## 2016-03-09 DIAGNOSIS — G4733 Obstructive sleep apnea (adult) (pediatric): Secondary | ICD-10-CM | POA: Insufficient documentation

## 2016-03-21 DIAGNOSIS — Z8249 Family history of ischemic heart disease and other diseases of the circulatory system: Secondary | ICD-10-CM | POA: Insufficient documentation

## 2016-03-30 ENCOUNTER — Ambulatory Visit (HOSPITAL_COMMUNITY)
Admission: EM | Admit: 2016-03-30 | Discharge: 2016-03-30 | Disposition: A | Discharge status: Home / Self Care | Payer: BLUE CROSS/BLUE SHIELD | Attending: Family Medicine | Admitting: Family Medicine

## 2016-03-30 ENCOUNTER — Encounter (HOSPITAL_COMMUNITY): Payer: Self-pay | Admitting: *Deleted

## 2016-03-30 ENCOUNTER — Encounter (HOSPITAL_COMMUNITY): Payer: Self-pay | Admitting: Emergency Medicine

## 2016-03-30 ENCOUNTER — Inpatient Hospital Stay (HOSPITAL_COMMUNITY)
Admission: AD | Admit: 2016-03-30 | Discharge: 2016-03-30 | Disposition: A | Discharge status: Home / Self Care | Payer: Self-pay | Source: Ambulatory Visit | Attending: Obstetrics and Gynecology | Admitting: Obstetrics and Gynecology

## 2016-03-30 DIAGNOSIS — M79601 Pain in right arm: Secondary | ICD-10-CM

## 2016-03-30 DIAGNOSIS — D573 Sickle-cell trait: Secondary | ICD-10-CM | POA: Insufficient documentation

## 2016-03-30 DIAGNOSIS — R0789 Other chest pain: Secondary | ICD-10-CM

## 2016-03-30 DIAGNOSIS — K2100 Gastro-esophageal reflux disease with esophagitis, without bleeding: Secondary | ICD-10-CM

## 2016-03-30 DIAGNOSIS — F419 Anxiety disorder, unspecified: Secondary | ICD-10-CM | POA: Insufficient documentation

## 2016-03-30 DIAGNOSIS — K219 Gastro-esophageal reflux disease without esophagitis: Secondary | ICD-10-CM | POA: Insufficient documentation

## 2016-03-30 DIAGNOSIS — M79602 Pain in left arm: Secondary | ICD-10-CM

## 2016-03-30 DIAGNOSIS — M791 Myalgia, unspecified site: Secondary | ICD-10-CM

## 2016-03-30 DIAGNOSIS — F329 Major depressive disorder, single episode, unspecified: Secondary | ICD-10-CM | POA: Insufficient documentation

## 2016-03-30 DIAGNOSIS — R102 Pelvic and perineal pain: Secondary | ICD-10-CM | POA: Insufficient documentation

## 2016-03-30 DIAGNOSIS — R071 Chest pain on breathing: Secondary | ICD-10-CM

## 2016-03-30 DIAGNOSIS — I10 Essential (primary) hypertension: Secondary | ICD-10-CM | POA: Insufficient documentation

## 2016-03-30 LAB — URINALYSIS, ROUTINE W REFLEX MICROSCOPIC
Bilirubin Urine: NEGATIVE
GLUCOSE, UA: NEGATIVE mg/dL
HGB URINE DIPSTICK: NEGATIVE
KETONES UR: 15 mg/dL — AB
Nitrite: NEGATIVE
PROTEIN: 30 mg/dL — AB
Specific Gravity, Urine: 1.01 (ref 1.005–1.030)
pH: 7.5 (ref 5.0–8.0)

## 2016-03-30 LAB — URINE MICROSCOPIC-ADD ON

## 2016-03-30 LAB — CBC WITH DIFFERENTIAL/PLATELET
Basophils Absolute: 0 10*3/uL (ref 0.0–0.1)
Basophils Relative: 0 %
Eosinophils Absolute: 0.2 10*3/uL (ref 0.0–0.7)
Eosinophils Relative: 3 %
HEMATOCRIT: 30.6 % — AB (ref 36.0–46.0)
HEMOGLOBIN: 10.7 g/dL — AB (ref 12.0–15.0)
LYMPHS ABS: 2.7 10*3/uL (ref 0.7–4.0)
Lymphocytes Relative: 40 %
MCH: 27.9 pg (ref 26.0–34.0)
MCHC: 35 g/dL (ref 30.0–36.0)
MCV: 79.9 fL (ref 78.0–100.0)
MONOS PCT: 9 %
Monocytes Absolute: 0.6 10*3/uL (ref 0.1–1.0)
NEUTROS ABS: 3.2 10*3/uL (ref 1.7–7.7)
NEUTROS PCT: 48 %
Platelets: 354 10*3/uL (ref 150–400)
RBC: 3.83 MIL/uL — ABNORMAL LOW (ref 3.87–5.11)
RDW: 13.7 % (ref 11.5–15.5)
WBC: 6.7 10*3/uL (ref 4.0–10.5)

## 2016-03-30 LAB — POCT PREGNANCY, URINE: PREG TEST UR: NEGATIVE

## 2016-03-30 MED ORDER — GI COCKTAIL ~~LOC~~
ORAL | Status: AC
  Filled 2016-03-30: qty 30

## 2016-03-30 MED ORDER — KETOROLAC TROMETHAMINE 60 MG/2ML IM SOLN
60.0000 mg | Freq: Once | INTRAMUSCULAR | Status: AC
  Administered 2016-03-30: 60 mg via INTRAMUSCULAR
  Filled 2016-03-30: qty 2

## 2016-03-30 MED ORDER — GI COCKTAIL ~~LOC~~
30.0000 mL | Freq: Once | ORAL | Status: AC
  Administered 2016-03-30: 30 mL via ORAL

## 2016-03-30 MED ORDER — IBUPROFEN 800 MG PO TABS: 800.0000 mg | ORAL_TABLET | Freq: Three times a day (TID) | ORAL | Status: DC | PRN

## 2016-03-30 NOTE — MAU Note (Signed)
Had an ectopic pregnancy in Feb.  Pt states she had a BM this evening and felt a "pull" in lower rt abd/pelvis area. Pain does radiate down the rt leg.  Denies vaginal bleeding.

## 2016-03-30 NOTE — ED Provider Notes (Signed)
CSN: [id number]     Arrival date & time 03/30/16  1256 History   First MD Initiated Contact with Patient 03/30/16 1318     Chief Complaint  Patient presents with  . Arm Pain   (Consider location/radiation/quality/duration/timing/severity/associated sxs/prior Treatment) HPI Comments: 45 year old female presents to the urgent care with chief complaint of bilateral arm pain. She states that it started in the right arm primarily to the outer aspect of the right upper arm and developed pain to the left outer upper arm. She states the pain was intermittent this morning but is now more persistent.He is also complaining of anterior chest pain, esophageal reflux symptoms and pain that radiates to the back. She has been seen in the emergency department numerous times for chronic intermittent chest pain. She has also been evaluated by GI for gastroesophageal reflux disease. She was recently placed on Protonix after undergoing an upper GI which found her to have gastritis and esophagitis. She recently underwent a cardiac catheterization due to the multiple visits to the emergency department for chest pain found to have clean arteries. No obstructive disease. She has a history of anxiety and depression. She is currently sitting on the exam table speaking very lonely, breathing deeply and blowing out with pursed lips in an effort to calm herself down. She has an exaggerated expression of despair. She denies having any anxiety or depression at this time.   Past Medical History  Diagnosis Date  . Esophageal stricture   . Chest pain 09/14/2009    no pulmonary embolus by chest CT  . Palpitations   . Elevated BP   . Anxiety     past Hx  . Depression     past Hx  . GERD (gastroesophageal reflux disease)     no meds  . Sickle cell trait ([place])   . Fibroid   . Abnormal Pap smear     cryo, normal since  . Fibroids 09/24/2015  . Hypertension   . Essential hypertension 09/28/2009    Qualifier: Diagnosis of   By: [name] FNP, [place]    . Arrhythmia   . Vaginal Pap smear, abnormal   . Pneumonia    Past Surgical History  Procedure Laterality Date  . Cesarean section  2004  . Laparoscopy for ectopic pregnancy  2001  . Wisdom tooth extraction  2012  . Cardiac catheterization N/A 01/04/2016    Procedure: Left Heart Cath and Coronary Angiography;  Surgeon: Belva Crome, MD;  Location: [facility];  Service: Cardiovascular;  Laterality: N/A;   Family History  Problem Relation Age of Onset  . Asthma Mother   . Hypertension Mother   . Hypertension Paternal Uncle   . Asthma Maternal Grandmother   . Colon cancer Neg Hx   . Esophageal cancer Neg Hx   . Stomach cancer Neg Hx   . Asthma Daughter   . Thyroid disease Mother   . Heart Problems Mother    Social History  Substance Use Topics  . Smoking status: Never Smoker   . Smokeless tobacco: Never Used  . Alcohol Use: No   OB History    Gravida Para Term Preterm AB TAB SAB Ectopic Multiple Living   7 2 2  0 3 0 0 3 0 2     Review of Systems  Constitutional: Positive for activity change. Negative for fever.  HENT: Negative.   Respiratory: Negative for cough, shortness of breath and wheezing.   Cardiovascular: Positive for chest pain. Negative for  leg swelling.  Gastrointestinal:       Esophageal reflux and heartburn.  Genitourinary: Negative.   Musculoskeletal: Positive for myalgias and back pain.       Patient states she feels as though she is having muscle pain in her back and arms.  Skin: Negative.   Neurological: Negative for dizziness, syncope, speech difficulty and headaches.  All other systems reviewed and are negative.   Allergies  Hydromorphone; Morphine; and Diphenhydramine  Home Medications   Prior to Admission medications   Medication Sig Start Date End Date Taking? Authorizing Provider  clonazePAM (KLONOPIN) 0.5 MG tablet Take 0.5 mg by mouth daily as needed for anxiety.    Historical Provider, MD   hydrochlorothiazide (MICROZIDE) 12.5 MG capsule Take 12.5 mg by mouth daily.    Historical Provider, MD  pantoprazole (PROTONIX) 40 MG tablet Take 40 mg by mouth 2 (two) times daily.    Historical Provider, MD  propranolol (INDERAL) 20 MG tablet Take 10 mg by mouth 2 (two) times daily.    Historical Provider, MD  Vitamin D, Ergocalciferol, (DRISDOL) 50000 units CAPS capsule Take 50,000 Units by mouth every 7 (seven) days.    Historical Provider, MD   Meds Ordered and Administered this Visit   Medications  gi cocktail (Maalox,Lidocaine,Donnatal) (30 mLs Oral Given 03/30/16 1408)    BP 130/87 mmHg  Pulse 80  Temp(Src) 98 F (36.7 C) (Oral)  Resp 18  SpO2 100%  LMP 03/24/2016 No data found.   Physical Exam  Constitutional: She is oriented to person, place, and time. She appears well-developed and well-nourished. No distress.  HENT:  Head: Normocephalic and atraumatic.  Mouth/Throat: Oropharynx is clear and moist. No oropharyngeal exudate.  Eyes: Conjunctivae and EOM are normal. Pupils are equal, round, and reactive to light.  Neck: Normal range of motion. Neck supple.  Cardiovascular: Normal rate, regular rhythm, normal heart sounds and intact distal pulses.   No murmur heard. Pulmonary/Chest: Effort normal and breath sounds normal. No respiratory distress. She has no wheezes. She has no rales. She exhibits tenderness.  There is anterior chest tenderness particularly along the parasternal borders as well as the right and left anterior chest wall.  Musculoskeletal: Normal range of motion. She exhibits no edema.  When palpating the musculatureof the upper extremities, musculature around the neck and back the patient states there is either no tenderness or just feels a little uncomfortable. No actual pain is reproduced with firm palpation. Patient exhibits full range of motion of the shoulders and arms. Distal neurovascular and motor sensory is intact. Normal warmth and color. Radial  pulses are 2+. No swelling, deformities or asymmetry.  Lymphadenopathy:    She has no cervical adenopathy.  Neurological: She is alert and oriented to person, place, and time. She has normal strength. No cranial nerve deficit or sensory deficit. She exhibits normal muscle tone. Coordination and gait normal.  Finger to nose is normal.  Skin: Skin is warm and dry.  Nursing note and vitals reviewed.   ED Course  Procedures (including critical care time)  Labs Review Labs Reviewed - No data to display  Imaging Review No results found.  ED ECG REPORT   Date: 03/30/2016  Rate: 66  Rhythm: normal sinus rhythm  QRS Axis: normal  Intervals: normal  ST/T Wave abnormalities: normal  Conduction Disutrbances:none  Narrative Interpretation:   Old EKG Reviewed: unchanged  I have personally reviewed the EKG tracing and agree with the computerized printout as noted.  Visual Acuity Review  Right Eye Distance:   Left Eye Distance:   Bilateral Distance:    Right Eye Near:   Left Eye Near:    Bilateral Near:         MDM   1. Anterior chest wall pain   2. Gastroesophageal reflux disease with esophagitis   3. Bilateral arm pain   4. Muscle pain    Patient with a long history of atypical and other type chest pain. As stated above. Her recent cardiac cath revealed clean coronaries. She has had reflux esophagitis over along. Of time with recent upper endoscopy's. She did not take her Protonix this morning. She was given a GI cocktail during her visit this morning and she does feel better after taking that. EKG is normal. Her physical exam was essentially normal.there is no signs of shortness of breath and no complaints of dyspnea. She has no tachycardia or tachypnea. I suspect there is some overlying emotional/anxiety issues. For her reflux esophagitis she needs to take her medications daily and may add Zantac at bedtime if needed. Tylenol for chest wall pain and arm pain. Do not  recommend NSAIDs due to GI disorder Follow your PCP for additional evaluation of arm and leg pain. For worsening, new symptoms or problems may need to go to the emergency department.    Janne Napoleon, NP 03/30/16 1430

## 2016-03-30 NOTE — ED Notes (Signed)
Patient was already awake when pain started, right arm initially, went away, then reoccurred.  Reports pain in both arms, upper back and questions if she is having reflux in chest.

## 2016-03-30 NOTE — Discharge Instructions (Signed)

## 2016-03-30 NOTE — Discharge Instructions (Signed)
Chest Wall Pain Ice to the chest and tylenol Chest wall pain is pain in or around the bones and muscles of your chest. Sometimes, an injury causes this pain. Sometimes, the cause may not be known. This pain may take several weeks or longer to get better. HOME CARE INSTRUCTIONS  Pay attention to any changes in your symptoms. Take these actions to help with your pain:   Rest as told by your health care provider.   Avoid activities that cause pain. These include any activities that use your chest muscles or your abdominal and side muscles to lift heavy items.   If directed, apply ice to the painful area:  Put ice in a plastic bag.  Place a towel between your skin and the bag.  Leave the ice on for 20 minutes, 2-3 times per day.  Take over-the-counter and prescription medicines only as told by your health care provider.  Do not use tobacco products, including cigarettes, chewing tobacco, and e-cigarettes. If you need help quitting, ask your health care provider.  Keep all follow-up visits as told by your health care provider. This is important. SEEK MEDICAL CARE IF:  You have a fever.  Your chest pain becomes worse.  You have new symptoms. SEEK IMMEDIATE MEDICAL CARE IF:  You have nausea or vomiting.  You feel sweaty or light-headed.  You have a cough with phlegm (sputum) or you cough up blood.  You develop shortness of breath.   This information is not intended to replace advice given to you by your health care provider. Make sure you discuss any questions you have with your health care provider.   Document Released: 11/14/2005 Document Revised: 08/05/2015 Document Reviewed: 02/09/2015 Elsevier Interactive Patient Education 2016 [employer]  Costochondritis Costochondritis, sometimes called Tietze syndrome, is a swelling and irritation (inflammation) of the tissue (cartilage) that connects your ribs with your breastbone (sternum). It causes pain in the chest and rib  area. Costochondritis usually goes away on its own over time. It can take up to 6 weeks or longer to get better, especially if you are unable to limit your activities. CAUSES  Some cases of costochondritis have no known cause. Possible causes include:  Injury (trauma).  Exercise or activity such as lifting.  Severe coughing. SIGNS AND SYMPTOMS  Pain and tenderness in the chest and rib area.  Pain that gets worse when coughing or taking deep breaths.  Pain that gets worse with specific movements. DIAGNOSIS  Your health care provider will do a physical exam and ask about your symptoms. Chest X-rays or other tests may be done to rule out other problems. TREATMENT  Costochondritis usually goes away on its own over time. Your health care provider may prescribe medicine to help relieve pain. HOME CARE INSTRUCTIONS   Avoid exhausting physical activity. Try not to strain your ribs during normal activity. This would include any activities using chest, abdominal, and side muscles, especially if heavy weights are used.  Apply ice to the affected area for the first 2 days after the pain begins.  Put ice in a plastic bag.  Place a towel between your skin and the bag.  Leave the ice on for 20 minutes, 2-3 times a day.  Only take over-the-counter or prescription medicines as directed by your health care provider. SEEK MEDICAL CARE IF:  You have redness or swelling at the rib joints. These are signs of infection.  Your pain does not go away despite rest or medicine. [place]  CARE IF:   Your pain increases or you are very uncomfortable.  You have shortness of breath or difficulty breathing.  You cough up blood.  You have worse chest pains, sweating, or vomiting.  You have a fever or persistent symptoms for more than 2-3 days.  You have a fever and your symptoms suddenly get worse. MAKE SURE YOU:   Understand these instructions.  Will watch your condition.  Will  get help right away if you are not doing well or get worse.   This information is not intended to replace advice given to you by your health care provider. Make sure you discuss any questions you have with your health care provider.   Document Released: 08/24/2005 Document Revised: 09/04/2013 Document Reviewed: 06/18/2013 Elsevier Interactive Patient Education 2016 [employer].  Gastroesophageal Reflux Disease, Adult May take Zantac at bedtime if worse at night. Normally, food travels down the esophagus and stays in the stomach to be digested. If a person has gastroesophageal reflux disease (GERD), food and stomach acid move back up into the esophagus. When this happens, the esophagus becomes sore and swollen (inflamed). Over time, GERD can make small holes (ulcers) in the lining of the esophagus. HOME CARE Diet  Follow a diet as told by your doctor. You may need to avoid foods and drinks such as:  Coffee and tea (with or without caffeine).  Drinks that contain alcohol.  Energy drinks and sports drinks.  Carbonated drinks or sodas.  Chocolate and cocoa.  Peppermint and mint flavorings.  Garlic and onions.  Horseradish.  Spicy and acidic foods, such as peppers, chili powder, curry powder, vinegar, hot sauces, and BBQ sauce.  Citrus fruit juices and citrus fruits, such as oranges, lemons, and limes.  Tomato-based foods, such as red sauce, chili, salsa, and pizza with red sauce.  Fried and fatty foods, such as donuts, french fries, potato chips, and high-fat dressings.  High-fat meats, such as hot dogs, rib eye steak, sausage, ham, and bacon.  High-fat dairy items, such as whole milk, butter, and cream cheese.  Eat small meals often. Avoid eating large meals.  Avoid drinking large amounts of liquid with your meals.  Avoid eating meals during the 2-3 hours before bedtime.  Avoid lying down right after you eat.  Do not exercise right after you eat. General  Instructions  Pay attention to any changes in your symptoms.  Take over-the-counter and prescription medicines only as told by your doctor. Do not take aspirin, ibuprofen, or other NSAIDs unless your doctor says it is okay.  Do not use any tobacco products, including cigarettes, chewing tobacco, and e-cigarettes. If you need help quitting, ask your doctor.  Wear loose clothes. Do not wear anything tight around your waist.  Raise (elevate) the head of your bed about 6 inches (15 cm).  Try to lower your stress. If you need help doing this, ask your doctor.  If you are overweight, lose an amount of weight that is healthy for you. Ask your doctor about a safe weight loss goal.  Keep all follow-up visits as told by your doctor. This is important. GET HELP IF:  You have new symptoms.  You lose weight and you do not know why it is happening.  You have trouble swallowing, or it hurts to swallow.  You have wheezing or a cough that keeps happening.  Your symptoms do not get better with treatment.  You have a hoarse voice. GET HELP RIGHT AWAY IF:  You have  pain in your arms, neck, jaw, teeth, or back.  You feel sweaty, dizzy, or light-headed.  You have chest pain or shortness of breath.  You throw up (vomit) and your throw up looks like blood or coffee grounds.  You pass out (faint).  Your poop (stool) is bloody or black.  You cannot swallow, drink, or eat.   This information is not intended to replace advice given to you by your health care provider. Make sure you discuss any questions you have with your health care provider.   Document Released: 05/02/2008 Document Revised: 08/05/2015 Document Reviewed: 03/11/2015 Elsevier Interactive Patient Education 2016 [employer]  Heartburn Heartburn is a type of pain or discomfort that can happen in the throat or chest. It is often described as a burning pain. It may also cause a bad taste in the mouth. Heartburn may feel worse  when you lie down or bend over, and it is often worse at night. Heartburn may be caused by stomach contents that move back up into the esophagus (reflux). HOME CARE INSTRUCTIONS Take these actions to decrease your discomfort and to help avoid complications. Diet  Follow a diet as recommended by your health care provider. This may involve avoiding foods and drinks such as:  Coffee and tea (with or without caffeine).  Drinks that contain alcohol.  Energy drinks and sports drinks.  Carbonated drinks or sodas.  Chocolate and cocoa.  Peppermint and mint flavorings.  Garlic and onions.  Horseradish.  Spicy and acidic foods, including peppers, chili powder, curry powder, vinegar, hot sauces, and barbecue sauce.  Citrus fruit juices and citrus fruits, such as oranges, lemons, and limes.  Tomato-based foods, such as red sauce, chili, salsa, and pizza with red sauce.  Fried and fatty foods, such as donuts, french fries, potato chips, and high-fat dressings.  High-fat meats, such as hot dogs and fatty cuts of red and white meats, such as rib eye steak, sausage, ham, and bacon.  High-fat dairy items, such as whole milk, butter, and cream cheese.  Eat small, frequent meals instead of large meals.  Avoid drinking large amounts of liquid with your meals.  Avoid eating meals during the 2-3 hours before bedtime.  Avoid lying down right after you eat.  Do not exercise right after you eat. General Instructions  Pay attention to any changes in your symptoms.  Take over-the-counter and prescription medicines only as told by your health care provider. Do not take aspirin, ibuprofen, or other NSAIDs unless your health care provider told you to do so.  Do not use any tobacco products, including cigarettes, chewing tobacco, and e-cigarettes. If you need help quitting, ask your health care provider.  Wear loose-fitting clothing. Do not wear anything tight around your waist that causes  pressure on your abdomen.  Raise (elevate) the head of your bed about 6 inches (15 cm).  Try to reduce your stress, such as with yoga or meditation. If you need help reducing stress, ask your health care provider.  If you are overweight, reduce your weight to an amount that is healthy for you. Ask your health care provider for guidance about a safe weight loss goal.  Keep all follow-up visits as told by your health care provider. This is important. SEEK MEDICAL CARE IF:  You have new symptoms.  You have unexplained weight loss.  You have difficulty swallowing, or it hurts to swallow.  You have wheezing or a persistent cough.  Your symptoms do not improve with treatment.  You have frequent heartburn for more than two weeks. SEEK IMMEDIATE MEDICAL CARE IF:  You have pain in your arms, neck, jaw, teeth, or back.  You feel sweaty, dizzy, or light-headed.  You have chest pain or shortness of breath.  You vomit and your vomit looks like blood or coffee grounds.  Your stool is bloody or black.   This information is not intended to replace advice given to you by your health care provider. Make sure you discuss any questions you have with your health care provider.   Document Released: 04/02/2009 Document Revised: 08/05/2015 Document Reviewed: 03/11/2015 Elsevier Interactive Patient Education 2016 [place] for Gastroesophageal Reflux Disease, Adult When you have gastroesophageal reflux disease (GERD), the foods you eat and your eating habits are very important. Choosing the right foods can help ease your discomfort.  WHAT GUIDELINES DO I NEED TO FOLLOW?   Choose fruits, vegetables, whole grains, and low-fat dairy products.   Choose low-fat meat, fish, and poultry.  Limit fats such as oils, salad dressings, butter, nuts, and avocado.   Keep a food diary. This helps you identify foods that cause symptoms.   Avoid foods that cause symptoms. These may be  different for everyone.   Eat small meals often instead of 3 large meals a day.   Eat your meals slowly, in a place where you are relaxed.   Limit fried foods.   Cook foods using methods other than frying.   Avoid drinking alcohol.   Avoid drinking large amounts of liquids with your meals.   Avoid bending over or lying down until 2-3 hours after eating.  WHAT FOODS ARE NOT RECOMMENDED?  These are some foods and drinks that may make your symptoms worse: Vegetables Tomatoes. Tomato juice. Tomato and spaghetti sauce. Chili peppers. Onion and garlic. Horseradish. Fruits Oranges, grapefruit, and lemon (fruit and juice). Meats High-fat meats, fish, and poultry. This includes hot dogs, ribs, ham, sausage, salami, and bacon. Dairy Whole milk and chocolate milk. Sour cream. Cream. Butter. Ice cream. Cream cheese.  Drinks Coffee and tea. Bubbly (carbonated) drinks or energy drinks. Condiments Hot sauce. Barbecue sauce.  Sweets/Desserts Chocolate and cocoa. Donuts. Peppermint and spearmint. Fats and Oils High-fat foods. This includes [place] fries and potato chips. Other Vinegar. Strong spices. This includes black pepper, white pepper, red pepper, cayenne, curry powder, cloves, ginger, and chili powder. The items listed above may not be a complete list of foods and drinks to avoid. Contact your dietitian for more information.   This information is not intended to replace advice given to you by your health care provider. Make sure you discuss any questions you have with your health care provider.   Document Released: 05/15/2012 Document Revised: 12/05/2014 Document Reviewed: 09/18/2013 Elsevier Interactive Patient Education 2016 [employer] Inc.  Muscle Pain, Adult Muscle pain (myalgia) may be caused by many things, including:  Overuse or muscle strain, especially if you are not in shape. This is the most common cause of muscle pain.  Injury.  Bruises.  Viruses, such as  the flu.  Infectious diseases.  Fibromyalgia, which is a chronic condition that causes muscle tenderness, fatigue, and headache.  Autoimmune diseases, including lupus.  Certain drugs, including ACE inhibitors and statins. Muscle pain may be mild or severe. In most cases, the pain lasts only a short time and goes away without treatment. To diagnose the cause of your muscle pain, your health care provider will take your medical history. This means he or she  will ask you when your muscle pain began and what has been happening. If you have not had muscle pain for very long, your health care provider may want to wait before doing much testing. If your muscle pain has lasted a long time, your health care provider may want to run tests right away. If your health care provider thinks your muscle pain may be caused by illness, you may need to have additional tests to rule out certain conditions.  Treatment for muscle pain depends on the cause. Home care is often enough to relieve muscle pain. Your health care provider may also prescribe anti-inflammatory medicine. HOME CARE INSTRUCTIONS Watch your condition for any changes. The following actions may help to lessen any discomfort you are feeling:  Only take over-the-counter or prescription medicines as directed by your health care provider.  Apply ice to the sore muscle:  Put ice in a plastic bag.  Place a towel between your skin and the bag.  Leave the ice on for 15-20 minutes, 3-4 times a day.  You may alternate applying hot and cold packs to the muscle as directed by your health care provider.  If overuse is causing your muscle pain, slow down your activities until the pain goes away.  Remember that it is normal to feel some muscle pain after starting a workout program. Muscles that have not been used often will be sore at first.  Do regular, gentle exercises if you are not usually active.  Warm up before exercising to lower your risk of  muscle pain.  Do not continue working out if the pain is very bad. Bad pain could mean you have injured a muscle. SEEK MEDICAL CARE IF:  Your muscle pain gets worse, and medicines do not help.  You have muscle pain that lasts longer than 3 days.  You have a rash or fever along with muscle pain.  You have muscle pain after a tick bite.  You have muscle pain while working out, even though you are in good physical condition.  You have redness, soreness, or swelling along with muscle pain.  You have muscle pain after starting a new medicine or changing the dose of a medicine. SEEK IMMEDIATE MEDICAL CARE IF:  You have trouble breathing.  You have trouble swallowing.  You have muscle pain along with a stiff neck, fever, and vomiting.  You have severe muscle weakness or cannot move part of your body. MAKE SURE YOU:   Understand these instructions.  Will watch your condition.  Will get help right away if you are not doing well or get worse.   This information is not intended to replace advice given to you by your health care provider. Make sure you discuss any questions you have with your health care provider.   Document Released: 10/06/2006 Document Revised: 12/05/2014 Document Reviewed: 09/10/2013 Elsevier Interactive Patient Education [employer].

## 2016-03-30 NOTE — MAU Provider Note (Signed)
Chief Complaint:  Abdominal Pain   First Provider Initiated Contact with Patient 03/30/16 1911     HPI  Stacy [name] is a 45 y.o. [id number] who presents to maternity admissions reporting severe right lower quadrant pain which started when she felt a pop in her right groin after having a BM.  States pain has migrated all over her abdomen now. . She reports no vaginal bleeding, vaginal itching/burning, urinary symptoms, h/a, dizziness, n/v, or fever/chills.    States her friend who is a nurse told her that abdominal pain can mean that she is having a heart attack. States she has been having arm pain but not now.  Review of chart shows multiple ED visits for chest pain, with all testing, including cath, negative. Followed in our GYN clinics for fibroids and Dysfunctional Uterine bleeding. Endometrial biopsy was negative.  Was supposed to have IUD inserted,but never scheduled that. Hx remarkable for multiple ectopics, most recently in January 2017.  RN Note: Had an ectopic pregnancy in Feb. Pt states she had a BM this evening and felt a "pull" in lower rt abd/pelvis area. Pain does radiate down the rt leg. Denies vaginal bleeding.         Past Medical History: Past Medical History  Diagnosis Date  . Esophageal stricture   . Chest pain 09/14/2009    no pulmonary embolus by chest CT  . Palpitations   . Elevated BP   . Anxiety     past Hx  . Depression     past Hx  . GERD (gastroesophageal reflux disease)     no meds  . Sickle cell trait ([place])   . Fibroid   . Abnormal Pap smear     cryo, normal since  . Fibroids 09/24/2015  . Hypertension   . Essential hypertension 09/28/2009    Qualifier: Diagnosis of  By: [name] FNP, [place]    . Arrhythmia   . Vaginal Pap smear, abnormal   . Pneumonia     Past obstetric history: OB History  Gravida Para Term Preterm AB SAB TAB Ectopic Multiple Living  7 2 2  0 3 0 0 3 0 2    # Outcome Date GA Lbr Len/2nd Weight Sex  Delivery Anes PTL Lv  7 Gravida           6 Gravida           5 Ectopic              Comments: mtx  4 Ectopic              Comments: mtx  3 Ectopic              Comments: srugery- living ectopic  2 Term      Vag-Spont     1 Term      CS-LTranv        Comments: breech      Past Surgical History: Past Surgical History  Procedure Laterality Date  . Cesarean section  2004  . Laparoscopy for ectopic pregnancy  2001  . Wisdom tooth extraction  2012  . Cardiac catheterization N/A 01/04/2016    Procedure: Left Heart Cath and Coronary Angiography;  Surgeon: Belva Crome, MD;  Location: [facility];  Service: Cardiovascular;  Laterality: N/A;    Family History: Family History  Problem Relation Age of Onset  . Asthma Mother   . Hypertension Mother   . Hypertension Paternal Uncle   . Asthma  Maternal Grandmother   . Colon cancer Neg Hx   . Esophageal cancer Neg Hx   . Stomach cancer Neg Hx   . Asthma Daughter   . Thyroid disease Mother   . Heart Problems Mother     Social History: Social History  Substance Use Topics  . Smoking status: Never Smoker   . Smokeless tobacco: Never Used  . Alcohol Use: No    Allergies:  Allergies  Allergen Reactions  . Hydromorphone Shortness Of Breath and Nausea And Vomiting  . Morphine Nausea And Vomiting  . Diphenhydramine Hives    Meds:  Prescriptions prior to admission  Medication Sig Dispense Refill Last Dose  . clonazePAM (KLONOPIN) 0.5 MG tablet Take 0.5 mg by mouth daily as needed for anxiety.   Unknown at Unknown time  . hydrochlorothiazide (MICROZIDE) 12.5 MG capsule Take 12.5 mg by mouth daily.   Unknown at Unknown time  . pantoprazole (PROTONIX) 40 MG tablet Take 40 mg by mouth 2 (two) times daily.   Unknown at Unknown time  . propranolol (INDERAL) 20 MG tablet Take 10 mg by mouth 2 (two) times daily.   Unknown at Unknown time  . Vitamin D, Ergocalciferol, (DRISDOL) 50000 units CAPS capsule Take 50,000 Units by mouth  every 7 (seven) days.   Unknown at Unknown time    I have reviewed patient's Past Medical Hx, Surgical Hx, Family Hx, Social Hx, medications and allergies.  ROS:  Review of Systems  Constitutional: Negative for fever, chills and fatigue.  Respiratory: Negative for chest tightness and shortness of breath.   Cardiovascular: Negative for chest pain.  Gastrointestinal: Positive for nausea and abdominal pain. Negative for vomiting, diarrhea and constipation.  Genitourinary: Positive for pelvic pain. Negative for dysuria, vaginal bleeding, vaginal discharge, difficulty urinating and vaginal pain.  Musculoskeletal: Negative for back pain.       Right groin pain   Neurological: Negative for dizziness.  Psychiatric/Behavioral: The patient is nervous/anxious.    Other systems negative     Physical Exam  Patient Vitals for the past 24 hrs:  BP Temp Temp src Pulse Resp SpO2 Height Weight  03/30/16 1843 130/82 mmHg 98.8 F (37.1 C) Oral 79 20 100 % 5' 4.25" (1.632 m) 188 lb 12.8 oz (85.639 kg)   Constitutional: Well-developed, well-nourished female in no acute distress.  Cardiovascular: normal rate and rhythm, no ectopy audible,  Respiratory: normal effort, no distress.  GI: Abd soft, moderately tender over lower abdomen.  Nondistended.  No rebound, No guarding.  MS: Extremities nontender, no edema, normal ROM Neurologic: Alert and oriented x 4.   Grossly nonfocal. GU: Neg CVAT. Skin:  Warm and Dry Psych:  Affect appropriate.  PELVIC EXAM: Cervix firm and large, anterior, neg CMT, uterus nontender, enlarged c/w fibroids, adnexa with tenderness bilaterally but no enlargement or mass    Labs: --/--/A POS (01/30 0700) Results for orders placed or performed during the hospital encounter of 03/30/16 (from the past 24 hour(s))  Urinalysis, Routine w reflex microscopic (not at [facility])     Status: Abnormal   Collection Time: 03/30/16  6:35 PM  Result Value Ref Range   Color, Urine YELLOW  YELLOW   APPearance HAZY (A) CLEAR   Specific Gravity, Urine 1.010 1.005 - 1.030   pH 7.5 5.0 - 8.0   Glucose, UA NEGATIVE NEGATIVE mg/dL   Hgb urine dipstick NEGATIVE NEGATIVE   Bilirubin Urine NEGATIVE NEGATIVE   Ketones, ur 15 (A) NEGATIVE mg/dL   Protein, ur 30 (  A) NEGATIVE mg/dL   Nitrite NEGATIVE NEGATIVE   Leukocytes, UA SMALL (A) NEGATIVE  Urine microscopic-add on     Status: Abnormal   Collection Time: 03/30/16  6:35 PM  Result Value Ref Range   Squamous Epithelial / LPF 6-30 (A) NONE SEEN   WBC, UA 6-30 0 - 5 WBC/hpf   RBC / HPF 0-5 0 - 5 RBC/hpf   Bacteria, UA MANY (A) NONE SEEN  Pregnancy, urine POC     Status: None   Collection Time: 03/30/16  6:50 PM  Result Value Ref Range   Preg Test, Ur NEGATIVE NEGATIVE  CBC with Differential     Status: Abnormal   Collection Time: 03/30/16  7:33 PM  Result Value Ref Range   WBC 6.7 4.0 - 10.5 K/uL   RBC 3.83 (L) 3.87 - 5.11 MIL/uL   Hemoglobin 10.7 (L) 12.0 - 15.0 g/dL   HCT 30.6 (L) 36.0 - 46.0 %   MCV 79.9 78.0 - 100.0 fL   MCH 27.9 26.0 - 34.0 pg   MCHC 35.0 30.0 - 36.0 g/dL   RDW 13.7 11.5 - 15.5 %   Platelets 354 150 - 400 K/uL   Neutrophils Relative % 48 %   Neutro Abs 3.2 1.7 - 7.7 K/uL   Lymphocytes Relative 40 %   Lymphs Abs 2.7 0.7 - 4.0 K/uL   Monocytes Relative 9 %   Monocytes Absolute 0.6 0.1 - 1.0 K/uL   Eosinophils Relative 3 %   Eosinophils Absolute 0.2 0.0 - 0.7 K/uL   Basophils Relative 0 %   Basophils Absolute 0.0 0.0 - 0.1 K/uL    Imaging:  No results found.  MAU Course/MDM: I have ordered labs as follows: CBC, diff, UA, UPT Imaging ordered: none necessary Results reviewed.   Consult Dr Elonda Husky.  He states if the WBC is not elevated, no need to do anything further, probably a pulled ligament in pelvis.   Treatments in MAU included Toradol.     Assessment: Pelvic pain of unknown origin.  Plan: Discharge home Recommend Followup in GYN clinic/office Rx sent for ibuprofen for pain      Medication List    ASK your doctor about these medications        hydrochlorothiazide 12.5 MG capsule  Commonly known as:  MICROZIDE  Take 12.5 mg by mouth daily.     KLONOPIN 0.5 MG tablet  Generic drug:  clonazePAM  Take 0.5 mg by mouth daily as needed for anxiety.     pantoprazole 40 MG tablet  Commonly known as:  PROTONIX  Take 40 mg by mouth 2 (two) times daily.     propranolol 20 MG tablet  Commonly known as:  INDERAL  Take 10 mg by mouth 2 (two) times daily.     Vitamin D (Ergocalciferol) 50000 units Caps capsule  Commonly known as:  DRISDOL  Take 50,000 Units by mouth every 7 (seven) days.       Encouraged to return here or to other Urgent Care/ED if she develops worsening of symptoms, increase in pain, fever, or other concerning symptoms.   Hansel Feinstein CNM, MSN Certified Nurse-Midwife 03/30/2016 7:11 PM

## 2016-04-18 ENCOUNTER — Ambulatory Visit
Admission: RE | Admit: 2016-04-18 | Discharge: 2016-04-18 | Disposition: A | Discharge status: Home / Self Care | Payer: BLUE CROSS/BLUE SHIELD | Source: Ambulatory Visit | Attending: Obstetrics & Gynecology | Admitting: Obstetrics & Gynecology

## 2016-04-18 DIAGNOSIS — Z1231 Encounter for screening mammogram for malignant neoplasm of breast: Secondary | ICD-10-CM

## 2016-05-15 ENCOUNTER — Emergency Department (HOSPITAL_BASED_OUTPATIENT_CLINIC_OR_DEPARTMENT_OTHER)
Admission: EM | Admit: 2016-05-15 | Discharge: 2016-05-15 | Disposition: A | Discharge status: Home / Self Care | Payer: BLUE CROSS/BLUE SHIELD | Attending: Emergency Medicine | Admitting: Emergency Medicine

## 2016-05-15 ENCOUNTER — Other Ambulatory Visit: Payer: Self-pay

## 2016-05-15 ENCOUNTER — Encounter (HOSPITAL_BASED_OUTPATIENT_CLINIC_OR_DEPARTMENT_OTHER): Payer: Self-pay | Admitting: *Deleted

## 2016-05-15 DIAGNOSIS — Z79899 Other long term (current) drug therapy: Secondary | ICD-10-CM | POA: Insufficient documentation

## 2016-05-15 DIAGNOSIS — F329 Major depressive disorder, single episode, unspecified: Secondary | ICD-10-CM | POA: Insufficient documentation

## 2016-05-15 DIAGNOSIS — I10 Essential (primary) hypertension: Secondary | ICD-10-CM | POA: Insufficient documentation

## 2016-05-15 DIAGNOSIS — R0602 Shortness of breath: Secondary | ICD-10-CM | POA: Insufficient documentation

## 2016-05-15 DIAGNOSIS — K2970 Gastritis, unspecified, without bleeding: Secondary | ICD-10-CM | POA: Insufficient documentation

## 2016-05-15 MED ORDER — GI COCKTAIL ~~LOC~~
30.0000 mL | Freq: Once | ORAL | Status: AC
  Administered 2016-05-15: 30 mL via ORAL
  Filled 2016-05-15: qty 30

## 2016-05-15 NOTE — ED Notes (Signed)
MD with pt  

## 2016-05-15 NOTE — Discharge Instructions (Signed)

## 2016-05-15 NOTE — ED Notes (Addendum)
C/o mid sternal chest pain that started yesterday. Describes as a constant type pain. C/o nausea and feeling a little sob. resp even and unlabored. C/o upper abd pain that started yesterday as well. Hx of reflux. States hx of constipation. States she had a loose stool yesterday. Denies any fevers. Denies any cough or cold symptoms.

## 2016-05-15 NOTE — ED Provider Notes (Signed)
CSN: [id number]     Arrival date & time 05/15/16  0734 History   First MD Initiated Contact with Patient 05/15/16 0749     Chief Complaint  Patient presents with  . Chest Pain     (Consider location/radiation/quality/duration/timing/severity/associated sxs/prior Treatment) HPI Comments: Patient presents with chest pain. She states it started yesterday and is been constant since that time. It radiates down into her epigastrium as well. She reports some mild associated shortness of breath. She's had some nausea but no vomiting. She has a history of constipation but denies any current constipation. She denies any known fevers. No cough or congestion. She's had a long-standing history of ED visits for chest and upper abdominal pain. She had a cardiac catheterization due to her multiple visits in February 2017. This showed normal coronary arteries. She's also had an event monitor which did not show any significant arrhythmias. She has been evaluated by gastroenterology and has had a recent upper endoscopy which showed esophagitis and gastritis per the patient report. She's currently on Nexium. It is also been felt that she has an anxiety component to her symptoms and frequent ED visits. She currently takes occasional Klonopin as well as propanolol. She does not feel an anxiety is contributing to her current symptoms although she states that she has a whole house full of people due to Father's Day currently.  Patient is a 45 y.o. female presenting with chest pain.  Chest Pain Associated symptoms: abdominal pain, nausea and shortness of breath   Associated symptoms: no back pain, no cough, no diaphoresis, no dizziness, no fatigue, no fever, no headache, no numbness, not vomiting and no weakness     Past Medical History  Diagnosis Date  . Esophageal stricture   . Chest pain 09/14/2009    no pulmonary embolus by chest CT  . Palpitations   . Elevated BP   . Anxiety     past Hx  . Depression    past Hx  . GERD (gastroesophageal reflux disease)     no meds  . Sickle cell trait ([place])   . Fibroid   . Abnormal Pap smear     cryo, normal since  . Fibroids 09/24/2015  . Hypertension   . Essential hypertension 09/28/2009    Qualifier: Diagnosis of  By: [name] FNP, [place]    . Arrhythmia   . Vaginal Pap smear, abnormal   . Pneumonia    Past Surgical History  Procedure Laterality Date  . Cesarean section  2004  . Laparoscopy for ectopic pregnancy  2001  . Wisdom tooth extraction  2012  . Cardiac catheterization N/A 01/04/2016    Procedure: Left Heart Cath and Coronary Angiography;  Surgeon: Belva Crome, MD;  Location: [facility];  Service: Cardiovascular;  Laterality: N/A;   Family History  Problem Relation Age of Onset  . Asthma Mother   . Hypertension Mother   . Hypertension Paternal Uncle   . Asthma Maternal Grandmother   . Colon cancer Neg Hx   . Esophageal cancer Neg Hx   . Stomach cancer Neg Hx   . Asthma Daughter   . Thyroid disease Mother   . Heart Problems Mother    Social History  Substance Use Topics  . Smoking status: Never Smoker   . Smokeless tobacco: Never Used  . Alcohol Use: No   OB History    Gravida Para Term Preterm AB TAB SAB Ectopic Multiple Living   7 2 2  0 3  0 0 3 0 2     Review of Systems  Constitutional: Negative for fever, chills, diaphoresis and fatigue.  HENT: Negative for congestion, rhinorrhea and sneezing.   Eyes: Negative.   Respiratory: Positive for shortness of breath. Negative for cough and chest tightness.   Cardiovascular: Positive for chest pain. Negative for leg swelling.  Gastrointestinal: Positive for nausea and abdominal pain. Negative for vomiting, diarrhea and blood in stool.  Genitourinary: Negative for frequency, hematuria, flank pain and difficulty urinating.  Musculoskeletal: Negative for back pain and arthralgias.  Skin: Negative for rash.  Neurological: Negative for dizziness, speech difficulty,  weakness, numbness and headaches.      Allergies  Hydromorphone; Morphine; and Diphenhydramine  Home Medications   Prior to Admission medications   Medication Sig Start Date End Date Taking? Authorizing Provider  esomeprazole (NEXIUM) 20 MG capsule Take 20 mg by mouth daily at 12 noon.   Yes Historical Provider, MD  clonazePAM (KLONOPIN) 0.5 MG tablet Take 0.5 mg by mouth daily as needed for anxiety.    Historical Provider, MD  hydrochlorothiazide (MICROZIDE) 12.5 MG capsule Take 12.5 mg by mouth every other day.     Historical Provider, MD  ibuprofen (ADVIL,MOTRIN) 800 MG tablet Take 1 tablet (800 mg total) by mouth every 8 (eight) hours as needed. 03/30/16   Tresea Mall, CNM  pantoprazole (PROTONIX) 40 MG tablet Take 40 mg by mouth 2 (two) times daily.    Historical Provider, MD  propranolol (INDERAL) 20 MG tablet Take 10 mg by mouth 2 (two) times daily.    Historical Provider, MD   BP 115/80 mmHg  Pulse 84  Temp(Src) 98.8 F (37.1 C) (Oral)  Resp 20  Ht 5\' 5"  (1.651 m)  Wt 182 lb (82.555 kg)  BMI 30.29 kg/m2  SpO2 100%  LMP 04/26/2016 (Exact Date) Physical Exam  Constitutional: She is oriented to person, place, and time. She appears well-developed and well-nourished.  HENT:  Head: Normocephalic and atraumatic.  Eyes: Pupils are equal, round, and reactive to light.  Neck: Normal range of motion. Neck supple.  Cardiovascular: Normal rate, regular rhythm and normal heart sounds.   Pulmonary/Chest: Effort normal and breath sounds normal. No respiratory distress. She has no wheezes. She has no rales. She exhibits tenderness.  Abdominal: Soft. Bowel sounds are normal. There is tenderness (Mild tenderness to the epigastrium). There is no rebound and no guarding.  Musculoskeletal: Normal range of motion. She exhibits no edema.  Lymphadenopathy:    She has no cervical adenopathy.  Neurological: She is alert and oriented to person, place, and time.  Skin: Skin is warm and dry. No  rash noted.  Psychiatric: She has a normal mood and affect.    ED Course  Procedures (including critical care time) Labs Review Labs Reviewed - No data to display  Imaging Review No results found. I have personally reviewed and evaluated these images and lab results as part of my medical decision-making.   EKG Interpretation   Date/Time:  Sunday May 15 2016 07:41:48 EDT Ventricular Rate:  84 PR Interval:    QRS Duration: 101 QT Interval:  366 QTC Calculation: 433 R Axis:   28 Text Interpretation:  Sinus rhythm Anteroseptal infarct, age indeterminate  since last tracing no significant change Confirmed by Ijeoma Loor  MD, Najae Filsaime  ([id number]) on 05/15/2016 7:58:41 AM      MDM   Final diagnoses:  Gastritis    Patient presents with epigastric and chest pain. She's had a long-standing  history of similar episodes. She got almost complete relief with a GI cocktail. She has a non-concerning abdominal exam. Her EKG doesn't show any ischemic changes. At this point I didn't feel that she needed lab work or imaging studies. She's had similar symptoms recurrently with large amounts of workups in the past. She was discharged home in good condition. She was encouraged to follow-up with her PCP or gastroenterologist if her symptoms are not improving or return here as needed for any worsening symptoms.    Malvin Johns, MD 05/15/16 [phone]

## 2016-05-29 ENCOUNTER — Inpatient Hospital Stay (HOSPITAL_COMMUNITY)
Admission: AD | Admit: 2016-05-29 | Discharge: 2016-05-29 | Disposition: A | Discharge status: Home / Self Care | Payer: Self-pay | Source: Ambulatory Visit | Attending: Obstetrics & Gynecology | Admitting: Obstetrics & Gynecology

## 2016-05-29 ENCOUNTER — Encounter (HOSPITAL_COMMUNITY): Payer: Self-pay | Admitting: *Deleted

## 2016-05-29 ENCOUNTER — Inpatient Hospital Stay (HOSPITAL_COMMUNITY): Payer: Self-pay

## 2016-05-29 DIAGNOSIS — D251 Intramural leiomyoma of uterus: Secondary | ICD-10-CM | POA: Insufficient documentation

## 2016-05-29 DIAGNOSIS — D259 Leiomyoma of uterus, unspecified: Secondary | ICD-10-CM

## 2016-05-29 DIAGNOSIS — D573 Sickle-cell trait: Secondary | ICD-10-CM | POA: Insufficient documentation

## 2016-05-29 DIAGNOSIS — Z79899 Other long term (current) drug therapy: Secondary | ICD-10-CM | POA: Insufficient documentation

## 2016-05-29 DIAGNOSIS — F419 Anxiety disorder, unspecified: Secondary | ICD-10-CM | POA: Insufficient documentation

## 2016-05-29 DIAGNOSIS — Z885 Allergy status to narcotic agent status: Secondary | ICD-10-CM | POA: Insufficient documentation

## 2016-05-29 DIAGNOSIS — K219 Gastro-esophageal reflux disease without esophagitis: Secondary | ICD-10-CM | POA: Insufficient documentation

## 2016-05-29 DIAGNOSIS — I10 Essential (primary) hypertension: Secondary | ICD-10-CM | POA: Insufficient documentation

## 2016-05-29 DIAGNOSIS — R102 Pelvic and perineal pain: Secondary | ICD-10-CM | POA: Insufficient documentation

## 2016-05-29 DIAGNOSIS — Z888 Allergy status to other drugs, medicaments and biological substances status: Secondary | ICD-10-CM | POA: Insufficient documentation

## 2016-05-29 LAB — URINALYSIS, ROUTINE W REFLEX MICROSCOPIC
BILIRUBIN URINE: NEGATIVE
Glucose, UA: NEGATIVE mg/dL
HGB URINE DIPSTICK: NEGATIVE
Ketones, ur: NEGATIVE mg/dL
Leukocytes, UA: NEGATIVE
Nitrite: NEGATIVE
PROTEIN: NEGATIVE mg/dL
SPECIFIC GRAVITY, URINE: 1.025 (ref 1.005–1.030)
pH: 5.5 (ref 5.0–8.0)

## 2016-05-29 LAB — CBC
HCT: 30.2 % — ABNORMAL LOW (ref 36.0–46.0)
Hemoglobin: 10.6 g/dL — ABNORMAL LOW (ref 12.0–15.0)
MCH: 27.7 pg (ref 26.0–34.0)
MCHC: 35.1 g/dL (ref 30.0–36.0)
MCV: 78.9 fL (ref 78.0–100.0)
PLATELETS: 344 10*3/uL (ref 150–400)
RBC: 3.83 MIL/uL — ABNORMAL LOW (ref 3.87–5.11)
RDW: 14.1 % (ref 11.5–15.5)
WBC: 5 10*3/uL (ref 4.0–10.5)

## 2016-05-29 LAB — POCT PREGNANCY, URINE: PREG TEST UR: NEGATIVE

## 2016-05-29 MED ORDER — GI COCKTAIL ~~LOC~~
30.0000 mL | Freq: Once | ORAL | Status: AC
  Administered 2016-05-29: 30 mL via ORAL
  Filled 2016-05-29: qty 30

## 2016-05-29 MED ORDER — KETOROLAC TROMETHAMINE 60 MG/2ML IM SOLN
60.0000 mg | Freq: Once | INTRAMUSCULAR | Status: AC
  Administered 2016-05-29: 60 mg via INTRAMUSCULAR
  Filled 2016-05-29: qty 2

## 2016-05-29 NOTE — MAU Provider Note (Signed)
History     CSN: [id number]  Arrival date and time: 05/29/16 1453   None     Chief Complaint  Patient presents with  . Pelvic Pain   HPI Pt is not pregnant and has had irregular periods with LMP 6/20.  Pt has GERD and take NExium 20mg  daily and take Zantac at night as needed. Pt presents with RLQ pain.  Pt is concerned she may be pregnant because of hx of ectopic.  Pt has hx of fibroids and sees Dr. Ihor Dow- has appointment in August. Pt has had endometrial bx (neg) for AUB and is looking at options for fibroids. Pt has cardiac issues with left heart cath and coronary angiography and sees cardiologist.  Pt goes to [facility] for PCP. Pt also has GERD and ibuprofen sometimes causes pain.   RN note: Pt states she is having right sided pelvic pain that has been going on for several days. Pt states she feels like something is there and there and that the right side of her abdomen feels swollen. Pt states she has a history of fibroids and they are usually large  Past Medical History  Diagnosis Date  . Esophageal stricture   . Chest pain 09/14/2009    no pulmonary embolus by chest CT  . Palpitations   . Elevated BP   . Anxiety     past Hx  . Depression     past Hx  . GERD (gastroesophageal reflux disease)     no meds  . Sickle cell trait ([place])   . Fibroid   . Abnormal Pap smear     cryo, normal since  . Fibroids 09/24/2015  . Hypertension   . Essential hypertension 09/28/2009    Qualifier: Diagnosis of  By: [name] FNP, [place]    . Arrhythmia   . Vaginal Pap smear, abnormal   . Pneumonia     Past Surgical History  Procedure Laterality Date  . Cesarean section  2004  . Laparoscopy for ectopic pregnancy  2001  . Wisdom tooth extraction  2012  . Cardiac catheterization N/A 01/04/2016    Procedure: Left Heart Cath and Coronary Angiography;  Surgeon: Belva Crome, MD;  Location: [facility];  Service: Cardiovascular;  Laterality: N/A;    Family  History  Problem Relation Age of Onset  . Asthma Mother   . Hypertension Mother   . Hypertension Paternal Uncle   . Asthma Maternal Grandmother   . Colon cancer Neg Hx   . Esophageal cancer Neg Hx   . Stomach cancer Neg Hx   . Asthma Daughter   . Thyroid disease Mother   . Heart Problems Mother     Social History  Substance Use Topics  . Smoking status: Never Smoker   . Smokeless tobacco: Never Used  . Alcohol Use: No    Allergies:  Allergies  Allergen Reactions  . Hydromorphone Shortness Of Breath and Nausea And Vomiting  . Morphine Nausea And Vomiting  . Diphenhydramine Hives    Prescriptions prior to admission  Medication Sig Dispense Refill Last Dose  . clonazePAM (KLONOPIN) 0.5 MG tablet Take 0.5 mg by mouth daily as needed for anxiety.   Past Week at Unknown time  . esomeprazole (NEXIUM) 20 MG capsule Take 20 mg by mouth daily.    05/29/2016 at Unknown time  . hydrochlorothiazide (MICROZIDE) 12.5 MG capsule Take 12.5 mg by mouth every other day.    05/28/2016 at Unknown time  .  propranolol (INDERAL) 20 MG tablet Take 10 mg by mouth 2 (two) times daily.   05/29/2016 at 0800  . ranitidine (ZANTAC) 150 MG tablet Take 150 mg by mouth at bedtime as needed for heartburn.   Past Week at Unknown time  . ibuprofen (ADVIL,MOTRIN) 800 MG tablet Take 1 tablet (800 mg total) by mouth every 8 (eight) hours as needed. (Patient not taking: Reported on 05/29/2016) 30 tablet 0     Review of Systems  Constitutional: Negative for fever and chills.  Cardiovascular: Negative for chest pain.  Gastrointestinal: Positive for heartburn, nausea and abdominal pain. Negative for vomiting.  Genitourinary: Positive for dysuria.  Neurological: Negative for dizziness and headaches.   Physical Exam   Blood pressure 117/77, pulse 90, temperature 98.2 F (36.8 C), temperature source Oral, resp. rate 18, last menstrual period 05/21/2016, unknown if currently breastfeeding.  Physical Exam  Nursing note  and vitals reviewed. Constitutional: She is oriented to person, place, and time. She appears well-developed and well-nourished. No distress.  HENT:  Head: Normocephalic.  Eyes: Pupils are equal, round, and reactive to light.  Neck: Normal range of motion. Neck supple.  Cardiovascular: Normal rate.   Respiratory: Effort normal.  GI: Soft. She exhibits no distension and no mass. There is tenderness. There is no rebound.  Tender RLQ- no rebound  Musculoskeletal: Normal range of motion.  Neurological: She is alert and oriented to person, place, and time.  Skin: Skin is warm and dry.  Psychiatric: She has a normal mood and affect.    MAU Course  Procedures  Results for orders placed or performed during the hospital encounter of 05/29/16 (from the past 24 hour(s))  Urinalysis, Routine w reflex microscopic (not at [facility])     Status: None   Collection Time: 05/29/16  3:03 PM  Result Value Ref Range   Color, Urine YELLOW YELLOW   APPearance CLEAR CLEAR   Specific Gravity, Urine 1.025 1.005 - 1.030   pH 5.5 5.0 - 8.0   Glucose, UA NEGATIVE NEGATIVE mg/dL   Hgb urine dipstick NEGATIVE NEGATIVE   Bilirubin Urine NEGATIVE NEGATIVE   Ketones, ur NEGATIVE NEGATIVE mg/dL   Protein, ur NEGATIVE NEGATIVE mg/dL   Nitrite NEGATIVE NEGATIVE   Leukocytes, UA NEGATIVE NEGATIVE  Pregnancy, urine POC     Status: None   Collection Time: 05/29/16  3:10 PM  Result Value Ref Range   Preg Test, Ur NEGATIVE NEGATIVE  CBC     Status: Abnormal   Collection Time: 05/29/16  3:38 PM  Result Value Ref Range   WBC 5.0 4.0 - 10.5 K/uL   RBC 3.83 (L) 3.87 - 5.11 MIL/uL   Hemoglobin 10.6 (L) 12.0 - 15.0 g/dL   HCT 30.2 (L) 36.0 - 46.0 %   MCV 78.9 78.0 - 100.0 fL   MCH 27.7 26.0 - 34.0 pg   MCHC 35.1 30.0 - 36.0 g/dL   RDW 14.1 11.5 - 15.5 %   Platelets 344 150 - 400 K/uL  US Transvaginal Non-ob  05/29/2016  CLINICAL DATA:  RIGHT lower quadrant pain. History of salpingectomy. EXAM: TRANSVAGINAL ULTRASOUND OF  PELVIS TECHNIQUE: Transvaginal ultrasound examinations of the pelvis were performed. It was necessary to proceed with endovaginal exam following the transabdominal exam to visualize the adnexa and endometrium. COMPARISON:  CT abdomen and pelvis February 04, 2016 and pelvic ultrasound January 03, 2016 and pelvic ultrasound December 29, 2015 FINDINGS: Uterus Measurements: 3 x 3.9 x 4.2 cm fundal intramural leiomyoma. 4.8 x 4.2 x  3.8 cm posterior mid uterine body intramural leiomyoma. Endometrium Thickness: 11 mm.  No focal abnormality visualized. Right ovary Measurements: 3.3 x 1.5 x 2 cm. Normal appearance/no adnexal mass. Left ovary Measurements: 2.8 x 1.6 x 0.9 cm. Normal appearance/no adnexal mass. Other findings No abnormal free fluid. IMPRESSION: No acute pelvic process. Similar appearance of 2 intramural leiomyoma measuring up to 4.8 cm. Electronically Signed   By: Elon Alas M.D.   On: 05/29/2016 16:48  Toradol 60mg  IM given in MAU with improvement in pain GI cocktail with great improvement in sx Assessment and Plan  Pelvic pain- take ibuprofen as tolerated or tylenol Note sent to clinic to see if Dr. Ihor Dow can see pt sooner Fibroids- NSAIDs as tol Return if increase in pain, fever, chills or heavy bleeding GERD- GI cocktail- f/u with GI    Andrey Hoobler 05/29/2016, 3:24 PM

## 2016-05-29 NOTE — MAU Note (Signed)
Pt states she is having right sided pelvic pain that has been going on for several days.  Pt states she feels like something is there and there and that the right side of her abdomen feels swollen.  Pt states she has a history of fibroids and they are usually large.

## 2016-06-06 ENCOUNTER — Ambulatory Visit (INDEPENDENT_AMBULATORY_CARE_PROVIDER_SITE_OTHER): Payer: BLUE CROSS/BLUE SHIELD | Admitting: Neurology

## 2016-06-06 ENCOUNTER — Encounter: Payer: Self-pay | Admitting: Neurology

## 2016-06-06 VITALS — BP 116/80 | HR 16 | Ht 65.0 in | Wt 183.0 lb

## 2016-06-06 DIAGNOSIS — G4733 Obstructive sleep apnea (adult) (pediatric): Secondary | ICD-10-CM

## 2016-06-06 DIAGNOSIS — M2550 Pain in unspecified joint: Secondary | ICD-10-CM

## 2016-06-06 DIAGNOSIS — F418 Other specified anxiety disorders: Secondary | ICD-10-CM

## 2016-06-06 DIAGNOSIS — R5383 Other fatigue: Secondary | ICD-10-CM

## 2016-06-06 DIAGNOSIS — R200 Anesthesia of skin: Secondary | ICD-10-CM

## 2016-06-06 NOTE — Progress Notes (Signed)
[facility]  PATIENT: Stacy [name] DOB: November 29, 1970  REFERRING DOCTOR OR PCP:  Debbrah Alar SOURCE: patient and records in EMR  _________________________________   HISTORICAL  CHIEF COMPLAINT:  Chief Complaint  Patient presents with  . Pain    Sts. fatigue is about the same. Sts. she still has intermittent left facial numbness, left sided weakness.  Sts. since last ov, she's been dx. with OSA (sees pulmonology), received a CPAP machine thru [facility].  Sts. she is waiting on a mask (couldn't use nasal pillows b/c she is a mouth breather).  Sts. she never started Buspar.  Sts. she tried it in the past and it "made me a zombie".  Sts. she is getting counseling, trying exercise to control anxiety/fim  . Fatigue  . Anxiety    HISTORY OF PRESENT ILLNESS:  Stacy [name] is a 45 yo woman with joint pain, headaches, numbness and visual changes.   evel  She was recently hospitalized with chest pain.  Troponin levels were elevated but the rest of the evaluation was normal.   She was diagnosed with OSA and placed on CPAP.   CPAP has not helped her fatigue.  Pain:   She has reported joint pain since the middle of 2016. Additionally she had pain in the left leg.   Her ESR was elevated (42) but ANA and CRP are normal. When re-tested 09/22/15, ESR was back to normal but CRP was elevated.    Headaches still occur but have not worsened any.   She reports that the joint pain and myalgias are a little better.     MRI of the brain 10/21/2015 was essentially normal    Vision/numbness:    She has had some blurry vision since 08/2015 but feels this is less of a problem now.    She has chronic dry eyes.   She has continued to have some facial numbness and left sided clumsiness at times.   She had been concerned about MS last year but MRI was normal.     Anxiety is doing better.   She is doing counseling.   She never started buspirone as she had not felt  good with it many years ago.    She takes clonazepam bid.       Fatigue/sleepiness:   She had a sleep study 09/20/2015 that did not show any significant OSA and no PLMS.. There was absence of stage III sleep and spontaneous arousals during the study.        REVIEW OF SYSTEMS: Constitutional: No fevers, chills, sweats, or change in appetite.  She notes fatigue. Eyes: She has noted some visual changes in the right eye.   No double vision or eye pain Ear, nose and throat: No hearing loss, ear pain, nasal congestion, sore throat Cardiovascular: No chest pain, palpitations Respiratory: No shortness of breath at rest or with exertion.   No wheezes GastrointestinaI: No nausea, vomiting, diarrhea, abdominal pain, fecal incontinence Genitourinary: No dysuria, urinary retention or frequency.  No nocturia. Musculoskeletal: as above Integumentary: No rash, pruritus, skin lesions Neurological: as above Psychiatric: Notes anxiety > depression.   Sees counseling Endocrine: No palpitations, diaphoresis, change in appetite, change in weigh or increased thirst Hematologic/Lymphatic: No anemia, purpura, petechiae. Allergic/Immunologic: No itchy/runny eyes, nasal congestion, recent allergic reactions, rashes  ALLERGIES: Allergies  Allergen Reactions  . Hydromorphone Shortness Of Breath and Nausea And Vomiting  . Morphine Nausea And Vomiting  . Diphenhydramine Hives    HOME MEDICATIONS:  Current outpatient prescriptions:  .  clonazePAM (KLONOPIN) 0.5 MG tablet, Take 0.5 mg by mouth daily as needed for anxiety., Disp: , Rfl:  .  esomeprazole (NEXIUM) 20 MG capsule, Take 20 mg by mouth daily. , Disp: , Rfl:  .  hydrochlorothiazide (MICROZIDE) 12.5 MG capsule, Take 12.5 mg by mouth every other day. , Disp: , Rfl:  .  propranolol (INDERAL) 20 MG tablet, Take 10 mg by mouth 2 (two) times daily., Disp: , Rfl:  .  ranitidine (ZANTAC) 150 MG tablet, Take 150 mg by mouth at bedtime as needed for  heartburn., Disp: , Rfl:  .  [DISCONTINUED] atorvastatin (LIPITOR) 40 MG tablet, Take 1 tablet (40 mg total) by mouth daily at 6 PM. (Patient not taking: Reported on 02/04/2016), Disp: 90 tablet, Rfl: 3 No current facility-administered medications for this visit.  Facility-Administered Medications Ordered in Other Visits:  .  iohexol (OMNIPAQUE) 300 MG/ML solution 100 mL, 100 mL, Intravenous, Once PRN, Jola Schmidt, MD  PAST MEDICAL HISTORY: Past Medical History  Diagnosis Date  . Esophageal stricture   . Chest pain 09/14/2009    no pulmonary embolus by chest CT  . Palpitations   . Elevated BP   . Anxiety     past Hx  . Depression     past Hx  . GERD (gastroesophageal reflux disease)     no meds  . Sickle cell trait ([place])   . Fibroid   . Abnormal Pap smear     cryo, normal since  . Fibroids 09/24/2015  . Hypertension   . Essential hypertension 09/28/2009    Qualifier: Diagnosis of  By: [name] FNP, [place]    . Arrhythmia   . Vaginal Pap smear, abnormal   . Pneumonia     PAST SURGICAL HISTORY: Past Surgical History  Procedure Laterality Date  . Cesarean section  2004  . Laparoscopy for ectopic pregnancy  2001  . Wisdom tooth extraction  2012  . Cardiac catheterization N/A 01/04/2016    Procedure: Left Heart Cath and Coronary Angiography;  Surgeon: Belva Crome, MD;  Location: [facility];  Service: Cardiovascular;  Laterality: N/A;    FAMILY HISTORY: Family History  Problem Relation Age of Onset  . Asthma Mother   . Hypertension Mother   . Hypertension Paternal Uncle   . Asthma Maternal Grandmother   . Colon cancer Neg Hx   . Esophageal cancer Neg Hx   . Stomach cancer Neg Hx   . Asthma Daughter   . Thyroid disease Mother   . Heart Problems Mother     SOCIAL HISTORY:  Social History   Social History  . Marital Status: Legally Separated    Spouse Name: N/A  . Number of Children: 2  . Years of Education: N/A   Occupational History  .  [employer]   Social History Main Topics  . Smoking status: Never Smoker   . Smokeless tobacco: Never Used  . Alcohol Use: No  . Drug Use: No  . Sexual Activity: No   Other Topics Concern  . Not on file   Social History Narrative   Epworth Sleepiness Scale = 5 (as of 09/16/2015)     PHYSICAL EXAM  Filed Vitals:   06/06/16 1402  BP: 116/80  Pulse: 16  Height: _0  (1.651 m)  Weight: 183 lb (83.008 kg)    Body mass index is 30.45 kg/(m^2).   General: The patient is well-developed and well-nourished and in no  acute distress  Skin: Extremities are without significant edema.  Musculoskeletal:  She is mildly tender over the subacromial bursae, AC joints bilaterally in the shoulders.   Good passive and active ROM.   She is also tender in some classic Fibromyalgia tender points.  Back is mildly tender.     Neurologic Exam  Mental status: The patient is alert and oriented x 3 at the time of the examination. The patient has apparent normal recent and remote memory, with an apparently normal attention span and concentration ability.   Speech is normal.  Cranial nerves: Extraocular movements are full.   Facial symmetry is present. There is good facial sensation to soft touch bilaterally.Facial strength is normal.  Trapezius and sternocleidomastoid strength is normal. No dysarthria is noted.  The tongue is midline, and the patient has symmetric elevation of the soft palate. No obvious hearing deficits are noted.  Motor:  Muscle bulk is normal.   Tone is normal. Strength is  5 / 5 in all 4 extremities.   Sensory: Sensory testing is intact to touch and vibration sensation in all 4 extremities.  Coordination: Cerebellar testing reveals good finger-nose-finger and heel-to-shin bilaterally.  Gait and station: Station is normal.   Gait is normal. Tandem gait is normal. Romberg is negative.   Reflexes: Deep tendon reflexes are symmetric and normal bilaterally.       DIAGNOSTIC DATA  (LABS, IMAGING, TESTING) - I reviewed patient records, labs, notes, testing and imaging myself where available.  Lab Results  Component Value Date   WBC 5.0 05/29/2016   HGB 10.6* 05/29/2016   HCT 30.2* 05/29/2016   MCV 78.9 05/29/2016   PLT 344 05/29/2016      Component Value Date/Time   NA 138 02/25/2016 0437   K 3.6 02/25/2016 0437   CL 104 02/25/2016 0437   CO2 25 02/25/2016 0437   GLUCOSE 104* 02/25/2016 0437   BUN 9 02/25/2016 0437   CREATININE 0.74 02/25/2016 0437   CREATININE 0.75 11/04/2013 1500   CALCIUM 9.2 02/25/2016 0437   PROT 7.5 02/04/2016 1703   ALBUMIN 3.6 02/04/2016 1703   AST 23 02/04/2016 1703   ALT 12* 02/04/2016 1703   ALKPHOS 45 02/04/2016 1703   BILITOT 1.1 02/04/2016 1703   GFRNONAA >60 02/25/2016 0437   GFRAA >60 02/25/2016 0437   Lab Results  Component Value Date   CHOL 197 01/02/2016   HDL 57 01/02/2016   LDLCALC 132* 01/02/2016   TRIG 41 01/02/2016   CHOLHDL 3.5 01/02/2016   Lab Results  Component Value Date   HGBA1C 5.3 05/24/2010   No results found for: VITAMINB12 Lab Results  Component Value Date   TSH 1.193 12/16/2015       ASSESSMENT AND PLAN  Pain, joint, multiple sites  Obstructive apnea  Depression with anxiety  Other fatigue  Numbness    1.    She is doing better than earlier this year when I last saw her.    Autoimmune evaluation last year was normal.    Brain MRI is normal. 2.   Stay active and exercise as tolerated 3.    She uses CPAP nightly as it may help with some of her symptoms including fatigue.  4.     rtc 12 months, sooner if problems  Shawnetta Lein A. Felecia Shelling, MD, PhD 5/73/2202, 5:42 PM Certified in Neurology, Clinical Neurophysiology, Sleep Medicine, Pain Medicine and Neuroimaging  [facility] [address] [place], [place] [address] [phone] h

## 2016-06-22 ENCOUNTER — Emergency Department (HOSPITAL_BASED_OUTPATIENT_CLINIC_OR_DEPARTMENT_OTHER)
Admission: EM | Admit: 2016-06-22 | Discharge: 2016-06-22 | Disposition: A | Discharge status: Home / Self Care | Payer: Self-pay | Attending: Emergency Medicine | Admitting: Emergency Medicine

## 2016-06-22 ENCOUNTER — Encounter (HOSPITAL_BASED_OUTPATIENT_CLINIC_OR_DEPARTMENT_OTHER): Payer: Self-pay | Admitting: *Deleted

## 2016-06-22 ENCOUNTER — Emergency Department (HOSPITAL_BASED_OUTPATIENT_CLINIC_OR_DEPARTMENT_OTHER): Payer: Self-pay

## 2016-06-22 DIAGNOSIS — R110 Nausea: Secondary | ICD-10-CM | POA: Insufficient documentation

## 2016-06-22 DIAGNOSIS — F329 Major depressive disorder, single episode, unspecified: Secondary | ICD-10-CM | POA: Insufficient documentation

## 2016-06-22 DIAGNOSIS — I10 Essential (primary) hypertension: Secondary | ICD-10-CM | POA: Insufficient documentation

## 2016-06-22 DIAGNOSIS — R0789 Other chest pain: Secondary | ICD-10-CM | POA: Insufficient documentation

## 2016-06-22 LAB — CBC
HCT: 34 % — ABNORMAL LOW (ref 36.0–46.0)
HEMOGLOBIN: 11.8 g/dL — AB (ref 12.0–15.0)
MCH: 28 pg (ref 26.0–34.0)
MCHC: 34.7 g/dL (ref 30.0–36.0)
MCV: 80.8 fL (ref 78.0–100.0)
PLATELETS: 408 10*3/uL — AB (ref 150–400)
RBC: 4.21 MIL/uL (ref 3.87–5.11)
RDW: 13.7 % (ref 11.5–15.5)
WBC: 4.2 10*3/uL (ref 4.0–10.5)

## 2016-06-22 LAB — URINALYSIS, ROUTINE W REFLEX MICROSCOPIC
Bilirubin Urine: NEGATIVE
Glucose, UA: NEGATIVE mg/dL
Hgb urine dipstick: NEGATIVE
KETONES UR: NEGATIVE mg/dL
LEUKOCYTES UA: NEGATIVE
NITRITE: NEGATIVE
PH: 8 (ref 5.0–8.0)
PROTEIN: NEGATIVE mg/dL
Specific Gravity, Urine: 1.017 (ref 1.005–1.030)

## 2016-06-22 LAB — BASIC METABOLIC PANEL
ANION GAP: 6 (ref 5–15)
BUN: 8 mg/dL (ref 6–20)
CHLORIDE: 103 mmol/L (ref 101–111)
CO2: 30 mmol/L (ref 22–32)
CREATININE: 0.67 mg/dL (ref 0.44–1.00)
Calcium: 9 mg/dL (ref 8.9–10.3)
GFR calc non Af Amer: >60 mL/min (ref >60)
Glucose, Bld: 89 mg/dL (ref 65–99)
Potassium: 3.5 mmol/L (ref 3.5–5.1)
SODIUM: 139 mmol/L (ref 135–145)

## 2016-06-22 LAB — TROPONIN I

## 2016-06-22 LAB — PREGNANCY, URINE: Preg Test, Ur: NEGATIVE

## 2016-06-22 MED ORDER — GI COCKTAIL ~~LOC~~
30.0000 mL | Freq: Once | ORAL | Status: AC
  Administered 2016-06-22: 30 mL via ORAL
  Filled 2016-06-22: qty 30

## 2016-06-22 NOTE — ED Notes (Signed)
PA at bedside.

## 2016-06-22 NOTE — ED Notes (Signed)
Midsternal CP onset last p.m. Hx GERD. Denies other s/s. Describes as pressure.

## 2016-06-22 NOTE — ED Notes (Signed)
MD at bedside discussing results. 

## 2016-06-22 NOTE — ED Provider Notes (Signed)
[facility] Provider Note   CSN: [id number] Arrival date & time: 06/22/16  [id number]  First Provider Contact:  First MD Initiated Contact with Patient 06/22/16 1006        History   Chief Complaint Chief Complaint  Patient presents with  . Chest Pain    HPI Stacy [name] is a 45 y.o. female.  HPI   Patient with hx GERD, esophagitis presents with constant central burning chest pain that began in the middle of the night, approximately 6 hours ago.  The pain is worse with lying flat.  Associated throat clearing, bad taste in the back of her mouth, and nausea.  She has taken zantac, rolaids, nexium, and propanolol without improvement.   Came in because she was scared it was her heart given her continued pain despite treatment.  Has had multiple ED visits for similar symptoms.  Had cardiac catheterization Feb 2017 demonstrating normal coronary arteries.  Denies any change in medications with exception of taking herself off klonopin last week.  Has made significant lifestyle changes - has lost weight, doesn't eat after 7pm, changed her food intake.  Last night ate a sweet potato and salad.  Denies NSAID, aspirin use, ETOH use.  Denies any fevers, vomiting, SOB, cough.     Past Medical History:  Diagnosis Date  . Abnormal Pap smear    cryo, normal since  . Anxiety    past Hx  . Arrhythmia   . Chest pain 09/14/2009   no pulmonary embolus by chest CT  . Depression    past Hx  . Elevated BP   . Esophageal stricture   . Essential hypertension 09/28/2009   Qualifier: Diagnosis of  By: [name] FNP, [place] Hampshire    . Fibroid   . Fibroids 09/24/2015  . GERD (gastroesophageal reflux disease)    no meds  . Hypertension   . Palpitations   . Pneumonia   . Sickle cell trait ([place])   . Vaginal Pap smear, abnormal     Patient Active Problem List   Diagnosis Date Noted  . Family history of cardiovascular disease 03/21/2016  . Obstructive apnea 03/09/2016  . Abnormal  uterine bleeding 02/22/2016  . Anxiety about health 02/22/2016  . Gastro-esophageal reflux disease without esophagitis 02/22/2016  . Fast heart beat 02/22/2016  . Fibroid 02/22/2016  . Elevated troponin   . Chest pain 01/01/2016  . Major depressive disorder ([place]) 10/26/2015  . 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 Fibroids 09/24/2015  . Pain, joint, multiple sites 09/22/2015  . Visual disturbance 09/22/2015  . Numbness 09/22/2015  . Dyspnea 08/10/2015  . Obesity (BMI 30-39.9) 04/17/2015  . Environmental allergies 04/17/2015  . Thyromegaly 10/01/2014  . Edema 11/07/2013  . Atypical chest pain 04/30/2013  . Other fatigue 05/24/2010  . VITAMIN D DEFICIENCY 09/30/2009  . PALPITATIONS, RECURRENT 09/30/2009  . CHEST PAIN, ATYPICAL, HX OF 09/30/2009  . Anxiety state 09/28/2009  . Depression with anxiety 09/28/2009  . GERD 09/28/2009  . Essential hypertension 09/28/2009    Past Surgical History:  Procedure Laterality Date  . CARDIAC CATHETERIZATION N/A 01/04/2016   Procedure: Left Heart Cath and Coronary Angiography;  Surgeon: Belva Crome, MD;  Location: [facility];  Service: Cardiovascular;  Laterality: N/A;  . CESAREAN SECTION  2004  . LAPAROSCOPY FOR ECTOPIC PREGNANCY  2001  . WISDOM TOOTH EXTRACTION  2012    OB History    Gravida Para Term Preterm AB Living   7 2 2  0 3  2   SAB TAB Ectopic Multiple Live Births   0 0 3 0         Home Medications    Prior to Admission medications   Medication Sig Start Date End Date Taking? Authorizing Provider  clonazePAM (KLONOPIN) 0.5 MG tablet Take 0.5 mg by mouth daily as needed for anxiety.    Historical Provider, MD  esomeprazole (NEXIUM) 20 MG capsule Take 20 mg by mouth daily.     Historical Provider, MD  hydrochlorothiazide (MICROZIDE) 12.5 MG capsule Take 12.5 mg by mouth every other day.     Historical Provider, MD  propranolol (INDERAL) 20 MG tablet Take 10 mg by mouth 2 (two) times daily.    Historical Provider, MD   ranitidine (ZANTAC) 150 MG tablet Take 150 mg by mouth at bedtime as needed for heartburn.    Historical Provider, MD    Family History Family History  Problem Relation Age of Onset  . Asthma Mother   . Hypertension Mother   . Thyroid disease Mother   . Heart Problems Mother   . Hypertension Paternal Uncle   . Asthma Maternal Grandmother   . Asthma Daughter   . Colon cancer Neg Hx   . Esophageal cancer Neg Hx   . Stomach cancer Neg Hx     Social History Social History  Substance Use Topics  . Smoking status: Never Smoker  . Smokeless tobacco: Never Used  . Alcohol use No     Allergies   Hydromorphone; Morphine; and Diphenhydramine   Review of Systems Review of Systems  Constitutional: Negative for activity change, appetite change, chills, fatigue and fever.  HENT: Negative for sore throat and trouble swallowing.   Respiratory: Negative for shortness of breath, wheezing and stridor.   Cardiovascular: Positive for chest pain.  Gastrointestinal: Positive for nausea. Negative for abdominal pain and vomiting.  Allergic/Immunologic: Negative for immunocompromised state.  Hematological: Does not bruise/bleed easily.  Psychiatric/Behavioral: Negative for self-injury.     Physical Exam Updated Vital Signs LMP 06/17/2016   Physical Exam  Constitutional: She appears well-developed and well-nourished. No distress.  HENT:  Head: Normocephalic and atraumatic.  Neck: Neck supple.  Cardiovascular: Normal rate and regular rhythm.   Pulmonary/Chest: Effort normal and breath sounds normal. No respiratory distress. She has no wheezes. She has no rales.  Abdominal: Soft. She exhibits no distension. There is tenderness (epigastric ). There is no rebound and no guarding.  Neurological: She is alert.  Skin: She is not diaphoretic.  Nursing note and vitals reviewed.    ED Treatments / Results  Labs (all labs ordered are listed, but only abnormal results are displayed) Labs  Reviewed  CBC - Abnormal; Notable for the following:       Result Value   Hemoglobin 11.8 (*)    HCT 34.0 (*)    Platelets 408 (*)    All other components within normal limits  BASIC METABOLIC PANEL  TROPONIN I  URINALYSIS, ROUTINE W REFLEX MICROSCOPIC (NOT AT [facility])  PREGNANCY, URINE    EKG  EKG Interpretation  Date/Time:  Wednesday June 22 2016 10:02:27 EDT Ventricular Rate:  69 PR Interval:    QRS Duration: 108 QT Interval:  394 QTC Calculation: 423 R Axis:   49 Text Interpretation:  Sinus rhythm Baseline wander in lead(s) V2 V3 Confirmed by Gilford Raid MD, JULIE ([id number]) on 06/22/2016 10:05:09 AM       Radiology Dg Chest 2 View  Result Date: 06/22/2016 CLINICAL DATA:  Chest pain  this morning. EXAM: CHEST  2 VIEW COMPARISON:  02/25/2016 FINDINGS: The heart size and mediastinal contours are within normal limits. Both lungs are clear. The visualized skeletal structures are unremarkable. IMPRESSION: No active cardiopulmonary disease. Electronically Signed   By: Rolm Baptise M.D.   On: 06/22/2016 11:13   Procedures Procedures (including critical care time)  Medications Ordered in ED Medications  gi cocktail (Maalox,Lidocaine,Donnatal) (30 mLs Oral Given 06/22/16 1019)     Initial Impression / Assessment and Plan / ED Course  I have reviewed the triage vital signs and the nursing notes.  Pertinent labs & imaging results that were available during my care of the patient were reviewed by me and considered in my medical decision making (see chart for details).  Clinical Course      Afebrile, nontoxic patient with hx gerd and esophagitis, multiple ED visits for chest pain, negative cardiac cath Feb 2017 p/w central burning chest pain that began in the middle of the night, worse with lying flat, also with epigastric tenderness (mild), nausea, throat clearing, bad taste in mouth - all consistent with GI etiology of chest pain.  Workup reassuring.  Doubt cardiac or pulmonary  etiology. Pt reassured.  GI cocktail given.  Pt also has stopped her klonopin, this may be contributing factor.  D/C home with PCP, GI follow up.  Discussed result, findings, treatment, and follow up  with patient.  Pt given return precautions.  Pt verbalizes understanding and agrees with plan.       Final Clinical Impressions(s) / ED Diagnoses   Final diagnoses:  Atypical chest pain    New Prescriptions New Prescriptions   No medications on file     Clayton Bibles, PA-C 06/22/16 1133    Isla Pence, MD 06/22/16 1306

## 2016-06-22 NOTE — Discharge Instructions (Signed)
Read the information below.  You may return to the Emergency Department at any time for worsening condition or any new symptoms that concern you.   If you develop worsening chest pain, shortness of breath, fever, you pass out, or become weak or dizzy, return to the ER for a recheck.    °

## 2016-06-29 ENCOUNTER — Encounter: Payer: Self-pay | Admitting: Obstetrics & Gynecology

## 2016-07-11 ENCOUNTER — Encounter: Payer: Self-pay | Admitting: Family Medicine

## 2016-07-17 NOTE — Progress Notes (Deleted)
Cardiology Office Note   Date:  07/17/2016   ID:  Stacy [name], DOB 04-Sep-1971, MRN [id number]  PCP:  Benito Mccreedy, MD  Cardiologist:   Skeet Latch, MD   No chief complaint on file.     History of Present Illness: Stacy [name] is a 45 y.o. female with hypertension, GERD, non-cardiac chest pain, shortness of breath, anxiety and depression who presents for follow up.  Ms. Stacy [name] was seen in the ED 07/2015 with chest pain and shortness of breath.   Cardiac enzymes and EKG were unremarkable.  LE ultrasound was negative for DVT one week prior to her ED visit.  She followed up in clinic 08/2016 and reported repeated episodes of chest pain, shortness of breath, palpitations and dizziness.  She reported having an ETT in 2015 that was negative for ischemia.  She was referred for ETT 10/15/15 that was negative for ischemia but showed poorly-controlled hypertension.  She also had a 7 day event monitor 08/2015 that showed no arrhythmias despite reports of heart racing, skipped beats and chest pain.  She has had some episodes of documented sinus tachycardia and has been treated with metoprolol, though this was switched to propranolol due to fatigue.  She has been seen in the ED with chest pain quite frequently.  At times her blood pressure has been as high as 210 mmHg.  Her symptoms improved somewhat after she started taking clonazepam.  She was admitted 2/3-2/7 with chest pain.  At the time one troponin was 0.07, though the follow up value was <0.03.  It was discovered that some of her chest pain was attributable to GERD.   Stacy [name] started [email or web address]  Past Medical History:  Diagnosis Date  . Abnormal Pap smear    cryo, normal since  . Anxiety    past Hx  . Arrhythmia   . Chest pain 09/14/2009   no pulmonary embolus by chest CT  . Depression    past Hx  . Elevated BP   . Esophageal stricture   . Essential hypertension 09/28/2009   Qualifier: Diagnosis of  By:  [name] FNP, [place]    . Fibroid   . Fibroids 09/24/2015  . GERD (gastroesophageal reflux disease)    no meds  . Hypertension   . Palpitations   . Pneumonia   . Sickle cell trait ([place])   . Vaginal Pap smear, abnormal     Past Surgical History:  Procedure Laterality Date  . CARDIAC CATHETERIZATION N/A 01/04/2016   Procedure: Left Heart Cath and Coronary Angiography;  Surgeon: Belva Crome, MD;  Location: [facility];  Service: Cardiovascular;  Laterality: N/A;  . CESAREAN SECTION  2004  . LAPAROSCOPY FOR ECTOPIC PREGNANCY  2001  . WISDOM TOOTH EXTRACTION  2012     Current Outpatient Prescriptions  Medication Sig Dispense Refill  . clonazePAM (KLONOPIN) 0.5 MG tablet Take 0.5 mg by mouth daily as needed for anxiety.    Marland Kitchen esomeprazole (NEXIUM) 20 MG capsule Take 20 mg by mouth daily.     . hydrochlorothiazide (MICROZIDE) 12.5 MG capsule Take 12.5 mg by mouth every other day.     . propranolol (INDERAL) 20 MG tablet Take 10 mg by mouth 2 (two) times daily.    . ranitidine (ZANTAC) 150 MG tablet Take 150 mg by mouth at bedtime as needed for heartburn.     No current facility-administered medications for this visit.    Facility-Administered Medications Ordered in Other Visits  Medication  Dose Route Frequency Provider Last Rate Last Dose  . iohexol (OMNIPAQUE) 300 MG/ML solution 100 mL  100 mL Intravenous Once PRN Jola Schmidt, MD        Allergies:   Hydromorphone; Morphine; and Diphenhydramine    Social History:  The patient  reports that she has never smoked. She has never used smokeless tobacco. She reports that she does not drink alcohol or use drugs.   Family History:  The patient's family history includes Asthma in her daughter, maternal grandmother, and mother; Heart Problems in her mother; Hypertension in her mother and paternal uncle; Thyroid disease in her mother.    ROS:  Please see the history of present illness.   Otherwise, review of systems are  positive for none.   All other systems are reviewed and negative.    PHYSICAL EXAM: VS:  LMP 06/17/2016  , BMI There is no height or weight on file to calculate BMI. GENERAL:  Well appearing HEENT:  Pupils equal round and reactive, fundi not visualized, oral mucosa unremarkable NECK:  No jugular venous distention, waveform within normal limits, carotid upstroke brisk and symmetric, no bruits, no thyromegaly LYMPHATICS:  No cervical adenopathy LUNGS:  Clear to auscultation bilaterally HEART:  RRR.  PMI not displaced or sustained,S1 and S2 within normal limits, no S3, no S4, no clicks, no rubs, no murmurs ABD:  Flat, positive bowel sounds normal in frequency in pitch, no bruits, no rebound, no guarding, no midline pulsatile mass, no hepatomegaly, no splenomegaly EXT:  2 plus pulses throughout, no edema, no cyanosis no clubbing SKIN:  No rashes no nodules NEURO:  Cranial nerves II through XII grossly intact, motor grossly intact throughout PSYCH:  Cognitively intact, oriented to person place and time   EKG:  EKG is not ordered today.  ETT 10/15/15:   Blood pressure demonstrated a hypertensive response to exercise.  Upsloping ST segment depression ST segment depression of 2 mm was noted during stress in the II, III, aVF, V6, V4 and V5 leads.  The patient experienced no angina during the stress test.  Overall, the patient's exercise capacity was normal.  [facility] Treadmill Score:low risk  Negative stress test without evidence of ischemia at given workload. Upsloping ST depression noted - may be due to LVH. Significant hypertension noted with rest BP 186/102 prior to exercise.  Echo 11/09/15: LVEF 76%.  Mild thickening of the mitral valve.  Trace MR and TR.  RVSP 28 mmHg.  7 Day Event Monitor 09/16/15:  Quality: Fair. Baseline artifact. Predominant rhythm: sinus rhythm Pauses >2.5 seconds: 0 No PVCs or PACs were noted.  Patient did submit a symptom diary. She reported heart  racing, skipped beats, and chest pain. At those times the underlying rhythm was sinus rhythm with rates from 70-93 bpm and one episode of sinus tachycardia, rate 113 bpm.  Echo 01/02/16: Study Conclusions  - Left ventricle: The cavity size was normal. Wall thickness was increased in a pattern of mild LVH. Systolic function was normal. The estimated ejection fraction was in the range of 60% to 65%. Wall motion was normal; there were no regional wall motion abnormalities. Doppler parameters are consistent with abnormal left ventricular relaxation (grade 1 diastolic dysfunction). - Mitral valve: There was trivial regurgitation. - Right atrium: Central venous pressure (est): 3 mm Hg. - Tricuspid valve: There was trivial regurgitation. - Pulmonary arteries: PA peak pressure: 10 mm Hg (S). - Pericardium, extracardiac: A trivial pericardial effusion was identified posterior to the heart.  Impressions:  -  Mild LVH with LVEF 60-65% and probable grade 1 diastolic dysfunction with normal LV filling pressure. Trivial mitral and tricuspid regurgitation. Possible trivial posterior pericardial effusion.  LHC 01/04/16:  Normal coronary arteries  Normal left ventricular function and hemodynamics  Chest pain is non-ischemic.   Recent Labs: 08/07/2015: B Natriuretic Peptide 27.2 12/16/2015: TSH 1.193 01/02/2016: Magnesium 2.1 02/04/2016: ALT 12 06/22/2016: BUN 8; Creatinine, Ser 0.67; Hemoglobin 11.8; Platelets 408; Potassium 3.5; Sodium 139    Lipid Panel    Component Value Date/Time   CHOL 197 01/02/2016 0249   TRIG 41 01/02/2016 0249   HDL 57 01/02/2016 0249   CHOLHDL 3.5 01/02/2016 0249   VLDL 8 01/02/2016 0249   LDLCALC 132 (H) 01/02/2016 0249      Wt Readings from Last 3 Encounters:  06/06/16 183 lb (83 kg)  05/15/16 182 lb (82.6 kg)  03/30/16 188 lb 12.8 oz (85.6 kg)      ASSESSMENT AND PLAN:  # Hypertension: Stop HCTZ and continue propranolol.  BP is on the  low side today.  Given that this also helps with her palpitations and anxiety, we will not stop propranolol.  # Chest/arm pain:  Non-cardiac chest pain.  Negative stress and normal coronaries on cath.  I agree with GI work up.  # Palpitations: 7 day event monitor did not reveal any abnormalities.  Symptoms improved on propranolol.   30 Minutes of direct patient care were spent with Ms. Kanda today.  The patient does not have concerns regarding medicines.  The following changes have been made:  no change  Labs/ tests ordered today include:   No orders of the defined types were placed in this encounter.    Disposition:   FU with Parker Sawatzky C. Oval Linsey, MD in 6 months.   Signed, Skeet Latch, MD  07/17/2016 9:32 PM    [place]

## 2016-07-18 ENCOUNTER — Ambulatory Visit: Payer: Self-pay | Admitting: Cardiovascular Disease

## 2016-07-21 ENCOUNTER — Encounter (HOSPITAL_BASED_OUTPATIENT_CLINIC_OR_DEPARTMENT_OTHER): Payer: Self-pay

## 2016-07-21 ENCOUNTER — Emergency Department (HOSPITAL_BASED_OUTPATIENT_CLINIC_OR_DEPARTMENT_OTHER): Payer: Self-pay

## 2016-07-21 ENCOUNTER — Emergency Department (HOSPITAL_BASED_OUTPATIENT_CLINIC_OR_DEPARTMENT_OTHER)
Admission: EM | Admit: 2016-07-21 | Discharge: 2016-07-21 | Disposition: A | Discharge status: Home / Self Care | Payer: Self-pay | Attending: Emergency Medicine | Admitting: Emergency Medicine

## 2016-07-21 DIAGNOSIS — I10 Essential (primary) hypertension: Secondary | ICD-10-CM | POA: Insufficient documentation

## 2016-07-21 DIAGNOSIS — Z79899 Other long term (current) drug therapy: Secondary | ICD-10-CM | POA: Insufficient documentation

## 2016-07-21 DIAGNOSIS — R1084 Generalized abdominal pain: Secondary | ICD-10-CM | POA: Insufficient documentation

## 2016-07-21 LAB — COMPREHENSIVE METABOLIC PANEL
ALT: 10 U/L — ABNORMAL LOW (ref 14–54)
ANION GAP: 5 (ref 5–15)
AST: 15 U/L (ref 15–41)
Albumin: 3.6 g/dL (ref 3.5–5.0)
Alkaline Phosphatase: 46 U/L (ref 38–126)
BUN: 10 mg/dL (ref 6–20)
CHLORIDE: 105 mmol/L (ref 101–111)
CO2: 28 mmol/L (ref 22–32)
Calcium: 8.8 mg/dL — ABNORMAL LOW (ref 8.9–10.3)
Creatinine, Ser: 0.66 mg/dL (ref 0.44–1.00)
GFR calc Af Amer: >60 mL/min (ref >60)
Glucose, Bld: 101 mg/dL — ABNORMAL HIGH (ref 65–99)
POTASSIUM: 3.2 mmol/L — AB (ref 3.5–5.1)
Sodium: 138 mmol/L (ref 135–145)
Total Bilirubin: 0.2 mg/dL — ABNORMAL LOW (ref 0.3–1.2)
Total Protein: 7.3 g/dL (ref 6.5–8.1)

## 2016-07-21 LAB — CBC WITH DIFFERENTIAL/PLATELET
BASOS ABS: 0 10*3/uL (ref 0.0–0.1)
BASOS PCT: 0 %
Eosinophils Absolute: 0.1 10*3/uL (ref 0.0–0.7)
Eosinophils Relative: 2 %
HEMATOCRIT: 32.4 % — AB (ref 36.0–46.0)
HEMOGLOBIN: 11.4 g/dL — AB (ref 12.0–15.0)
LYMPHS PCT: 47 %
Lymphs Abs: 3.2 10*3/uL (ref 0.7–4.0)
MCH: 28.5 pg (ref 26.0–34.0)
MCHC: 35.2 g/dL (ref 30.0–36.0)
MCV: 81 fL (ref 78.0–100.0)
MONO ABS: 0.9 10*3/uL (ref 0.1–1.0)
Monocytes Relative: 14 %
NEUTROS ABS: 2.5 10*3/uL (ref 1.7–7.7)
NEUTROS PCT: 37 %
Platelets: 388 10*3/uL (ref 150–400)
RBC: 4 MIL/uL (ref 3.87–5.11)
RDW: 13.4 % (ref 11.5–15.5)
WBC: 6.8 10*3/uL (ref 4.0–10.5)

## 2016-07-21 LAB — LIPASE, BLOOD: LIPASE: 21 U/L (ref 11–51)

## 2016-07-21 LAB — TROPONIN I

## 2016-07-21 MED ORDER — ONDANSETRON 8 MG PO TBDP: 8.0000 mg | ORAL_TABLET | Freq: Three times a day (TID) | ORAL | 0 refills | Status: DC | PRN

## 2016-07-21 MED ORDER — GI COCKTAIL ~~LOC~~
30.0000 mL | Freq: Once | ORAL | Status: AC
Start: 2016-07-21 — End: 2016-07-21
  Administered 2016-07-21: 30 mL via ORAL
  Filled 2016-07-21: qty 30

## 2016-07-21 MED ORDER — IOPAMIDOL (ISOVUE-300) INJECTION 61%
100.0000 mL | Freq: Once | INTRAVENOUS | Status: AC | PRN
  Administered 2016-07-21: 100 mL via INTRAVENOUS

## 2016-07-21 MED ORDER — ONDANSETRON HCL 4 MG/2ML IJ SOLN
4.0000 mg | Freq: Once | INTRAMUSCULAR | Status: AC
  Administered 2016-07-21: 4 mg via INTRAVENOUS
  Filled 2016-07-21: qty 2

## 2016-07-21 MED ORDER — FENTANYL CITRATE (PF) 100 MCG/2ML IJ SOLN
100.0000 ug | Freq: Once | INTRAMUSCULAR | Status: AC
  Administered 2016-07-21: 100 ug via INTRAVENOUS
  Filled 2016-07-21: qty 2

## 2016-07-21 NOTE — ED Notes (Signed)
Pt is approximately half way finished with oral contrast

## 2016-07-21 NOTE — ED Notes (Addendum)
Pt c/o a lot of gas and nausea now, keeps going to the restroom, denies any diarrhea or bowel movement at this time

## 2016-07-21 NOTE — ED Notes (Signed)
Pt verbalizes understanding of d/c instructions and denies any further needs at this time. 

## 2016-07-21 NOTE — ED Notes (Signed)
Pt had to rush to restroom prior to IV administration, c/o gas

## 2016-07-21 NOTE — ED Triage Notes (Signed)
Pt states she woke up with central epigastric chest and abdominal pain with nausea and felt like her heart was racing with gagging, and radiating to her left shoulder

## 2016-07-21 NOTE — ED Notes (Addendum)
Pt drank almost all of contrast, up to the restroom, CT aware.

## 2016-07-21 NOTE — ED Notes (Signed)
Patient transported to CT 

## 2016-07-21 NOTE — ED Notes (Signed)
Pt returned from CT °

## 2016-07-21 NOTE — ED Notes (Signed)
Pt starting to sip on contrast

## 2016-07-21 NOTE — ED Provider Notes (Signed)
[facility] Provider Note   CSN: [id number] Arrival date & time: 07/21/16  0100     History   Chief Complaint Chief Complaint  Patient presents with  . Abdominal Pain    HPI Stacy [name] is a 45 y.o. female with a history of GERD. She is here with pain that awakened her from sleep just prior to arrival. The pain is located periumbilically and radiates up into the chest. She states the pain is severe and unlike any pain she's had in the past. She has difficulty qualifying it but compares it to gas pain. She has had nausea but no vomiting. She states she is chronically constipated. Pain is worse with movement or palpation.  HPI  Past Medical History:  Diagnosis Date  . Abnormal Pap smear    cryo, normal since  . Anxiety    past Hx  . Arrhythmia   . Chest pain 09/14/2009   no pulmonary embolus by chest CT  . Depression    past Hx  . Elevated BP   . Esophageal stricture   . Essential hypertension 09/28/2009   Qualifier: Diagnosis of  By: [name] FNP, [place]    . Fibroid   . Fibroids 09/24/2015  . GERD (gastroesophageal reflux disease)    no meds  . Hypertension   . Palpitations   . Pneumonia   . Sickle cell trait ([place])   . Vaginal Pap smear, abnormal     Patient Active Problem List   Diagnosis Date Noted  . Family history of cardiovascular disease 03/21/2016  . Obstructive apnea 03/09/2016  . Abnormal uterine bleeding 02/22/2016  . Anxiety about health 02/22/2016  . Gastro-esophageal reflux disease without esophagitis 02/22/2016  . Fast heart beat 02/22/2016  . Fibroid 02/22/2016  . Elevated troponin   . Chest pain 01/01/2016  . Major depressive disorder ([place]) 10/26/2015  . 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 Fibroids 09/24/2015  . Pain, joint, multiple sites 09/22/2015  . Visual disturbance 09/22/2015  . Numbness 09/22/2015  . Dyspnea 08/10/2015  . Obesity (BMI 30-39.9) 04/17/2015  . Environmental allergies 04/17/2015  .  Thyromegaly 10/01/2014  . Edema 11/07/2013  . Atypical chest pain 04/30/2013  . Other fatigue 05/24/2010  . VITAMIN D DEFICIENCY 09/30/2009  . PALPITATIONS, RECURRENT 09/30/2009  . CHEST PAIN, ATYPICAL, HX OF 09/30/2009  . Anxiety state 09/28/2009  . Depression with anxiety 09/28/2009  . GERD 09/28/2009  . Essential hypertension 09/28/2009    Past Surgical History:  Procedure Laterality Date  . CARDIAC CATHETERIZATION N/A 01/04/2016   Procedure: Left Heart Cath and Coronary Angiography;  Surgeon: Belva Crome, MD;  Location: [facility];  Service: Cardiovascular;  Laterality: N/A;  . CESAREAN SECTION  2004  . LAPAROSCOPY FOR ECTOPIC PREGNANCY  2001  . WISDOM TOOTH EXTRACTION  2012    OB History    Gravida Para Term Preterm AB Living   7 2 2  0 3 2   SAB TAB Ectopic Multiple Live Births   0 0 3 0         Home Medications    Prior to Admission medications   Medication Sig Start Date End Date Taking? Authorizing Provider  clonazePAM (KLONOPIN) 0.5 MG tablet Take 0.5 mg by mouth daily as needed for anxiety.    Historical Provider, MD  esomeprazole (NEXIUM) 20 MG capsule Take 20 mg by mouth daily.     Historical Provider, MD  hydrochlorothiazide (MICROZIDE) 12.5 MG capsule Take 12.5 mg by mouth  every other day.     Historical Provider, MD  propranolol (INDERAL) 20 MG tablet Take 10 mg by mouth 2 (two) times daily.    Historical Provider, MD  ranitidine (ZANTAC) 150 MG tablet Take 150 mg by mouth at bedtime as needed for heartburn.    Historical Provider, MD    Family History Family History  Problem Relation Age of Onset  . Asthma Mother   . Hypertension Mother   . Thyroid disease Mother   . Heart Problems Mother   . Hypertension Paternal Uncle   . Asthma Maternal Grandmother   . Asthma Daughter   . Colon cancer Neg Hx   . Esophageal cancer Neg Hx   . Stomach cancer Neg Hx     Social History Social History  Substance Use Topics  . Smoking status: Never  Smoker  . Smokeless tobacco: Never Used  . Alcohol use No     Allergies   Hydromorphone; Morphine; and Diphenhydramine   Review of Systems Review of Systems  All other systems reviewed and are negative.    Physical Exam Updated Vital Signs BP 128/80   Pulse 81   Temp 97.8 F (36.6 C) (Oral)   Resp 13   Ht 5\' 5"  (1.651 m)   Wt 183 lb (83 kg)   LMP 06/03/2016   SpO2 100%   BMI 30.45 kg/m   Physical Exam General: Well-developed, well-nourished female in no acute distress; appearance consistent with age of record HENT: normocephalic; atraumatic Eyes: pupils equal, round and reactive to light; extraocular muscles intact Neck: supple Heart: regular rate and rhythm Lungs: clear to auscultation bilaterally Abdomen: soft; nondistended; diffusely tender, most prominently in the periumbilical region; no masses or hepatosplenomegaly; bowel sounds present Extremities: No deformity; full range of motion; pulses normal Neurologic: Awake, alert and oriented; motor function intact in all extremities and symmetric; no facial droop Skin: Warm and dry Psychiatric: Flat affect    ED Treatments / Results   Nursing notes and vitals signs, including pulse oximetry, reviewed.  Summary of this visit's results, reviewed by myself:  Labs:  Results for orders placed or performed during the hospital encounter of 07/21/16 (from the past 24 hour(s))  Lipase, blood     Status: None   Collection Time: 07/21/16  1:30 AM  Result Value Ref Range   Lipase 21 11 - 51 U/L  Comprehensive metabolic panel     Status: Abnormal   Collection Time: 07/21/16  1:30 AM  Result Value Ref Range   Sodium 138 135 - 145 mmol/L   Potassium 3.2 (L) 3.5 - 5.1 mmol/L   Chloride 105 101 - 111 mmol/L   CO2 28 22 - 32 mmol/L   Glucose, Bld 101 (H) 65 - 99 mg/dL   BUN 10 6 - 20 mg/dL   Creatinine, Ser 0.66 0.44 - 1.00 mg/dL   Calcium 8.8 (L) 8.9 - 10.3 mg/dL   Total Protein 7.3 6.5 - 8.1 g/dL   Albumin 3.6 3.5  - 5.0 g/dL   AST 15 15 - 41 U/L   ALT 10 (L) 14 - 54 U/L   Alkaline Phosphatase 46 38 - 126 U/L   Total Bilirubin 0.2 (L) 0.3 - 1.2 mg/dL   GFR calc non Af Amer >60 >60 mL/min   GFR calc Af Amer >60 >60 mL/min   Anion gap 5 5 - 15  Troponin I     Status: None   Collection Time: 07/21/16  1:30 AM  Result Value  Ref Range   Troponin I <0.03 <0.03 ng/mL  CBC with Differential/Platelet     Status: Abnormal   Collection Time: 07/21/16  1:30 AM  Result Value Ref Range   WBC 6.8 4.0 - 10.5 K/uL   RBC 4.00 3.87 - 5.11 MIL/uL   Hemoglobin 11.4 (L) 12.0 - 15.0 g/dL   HCT 32.4 (L) 36.0 - 46.0 %   MCV 81.0 78.0 - 100.0 fL   MCH 28.5 26.0 - 34.0 pg   MCHC 35.2 30.0 - 36.0 g/dL   RDW 13.4 11.5 - 15.5 %   Platelets 388 150 - 400 K/uL   Neutrophils Relative % 37 %   Neutro Abs 2.5 1.7 - 7.7 K/uL   Lymphocytes Relative 47 %   Lymphs Abs 3.2 0.7 - 4.0 K/uL   Monocytes Relative 14 %   Monocytes Absolute 0.9 0.1 - 1.0 K/uL   Eosinophils Relative 2 %   Eosinophils Absolute 0.1 0.0 - 0.7 K/uL   Basophils Relative 0 %   Basophils Absolute 0.0 0.0 - 0.1 K/uL    Imaging Studies: Ct Abdomen Pelvis W Contrast  Result Date: 07/21/2016 CLINICAL DATA:  45 y/o F; mid abdominal pain and nausea. History of uterine fibroids. EXAM: CT ABDOMEN AND PELVIS WITH CONTRAST TECHNIQUE: Multidetector CT imaging of the abdomen and pelvis was performed using the standard protocol following bolus administration of intravenous contrast. CONTRAST:  114mL ISOVUE-300 IOPAMIDOL (ISOVUE-300) INJECTION 61% COMPARISON:  CT abdomen and pelvis 02/04/2016. Pelvic ultrasound 05/29/2016. FINDINGS: Lower chest:  No acute findings. Hepatobiliary: No masses or other significant abnormality. Pancreas: No mass, inflammatory changes, or other significant abnormality. Spleen: Within normal limits in size and appearance. Adrenals/Urinary Tract: No masses identified. No evidence of hydronephrosis. Stomach/Bowel: No evidence of obstruction,  inflammatory process, or abnormal fluid collections. Normal appendix. Vascular/Lymphatic: No pathologically enlarged lymph nodes. No evidence of abdominal aortic aneurysm. Reproductive: 2 uterine myomas measuring up to 4.8 cm stable in comparison with the prior sonogram with air better characterize. Other: None. Musculoskeletal:  No suspicious bone lesions identified. IMPRESSION: 1. No acute abnormality is identified as explanation for abdominal pain. 2. Myomatous uterus. Electronically Signed   By: Kristine Garbe M.D.   On: 07/21/2016 05:05    EKG  EKG Interpretation  Date/Time:  Thursday July 21 2016 01:08:41 EDT Ventricular Rate:  93 PR Interval:    QRS Duration: 109 QT Interval:  356 QTC Calculation: 443 R Axis:   35 Text Interpretation:  Sinus rhythm Artifact Otherwise no significant change Confirmed by Florina Ou  MD, Jenny Reichmann ([id number]) on 07/21/2016 1:15:35 AM      Procedures (including critical care time)  Final Clinical Impressions(s) / ED Diagnoses   Final diagnoses:  Generalized abdominal pain      Shanon Rosser, MD 07/21/16 [phone]

## 2016-07-22 ENCOUNTER — Encounter: Payer: Self-pay | Admitting: Cardiovascular Disease

## 2016-07-22 ENCOUNTER — Ambulatory Visit (INDEPENDENT_AMBULATORY_CARE_PROVIDER_SITE_OTHER): Payer: Self-pay | Admitting: Cardiovascular Disease

## 2016-07-22 VITALS — BP 120/79 | HR 71 | Ht 65.0 in | Wt 184.4 lb

## 2016-07-22 DIAGNOSIS — R002 Palpitations: Secondary | ICD-10-CM

## 2016-07-22 DIAGNOSIS — I10 Essential (primary) hypertension: Secondary | ICD-10-CM

## 2016-07-22 DIAGNOSIS — F419 Anxiety disorder, unspecified: Secondary | ICD-10-CM

## 2016-07-22 MED ORDER — HYDROCHLOROTHIAZIDE 12.5 MG PO CAPS: 12.5000 mg | ORAL_CAPSULE | ORAL | 3 refills | Status: DC

## 2016-07-22 NOTE — Patient Instructions (Signed)

## 2016-07-22 NOTE — Progress Notes (Signed)
Cardiology Office Note   Date:  07/22/2016   ID:  Stacy [name], DOB 02/05/1971, MRN [id number]  PCP:  Benito Mccreedy, MD  Cardiologist:   Skeet Latch, MD   Chief Complaint  Patient presents with  . Follow-up    pain in legs occasionally.       History of Present Illness: Stacy [name] is a 45 y.o. female with hypertension, GERD, non-cardiac chest pain, shortness of breath, anxiety and depression who presents for follow up.  Stacy [name] was seen in the ED 07/2015 with chest pain and shortness of breath.   Cardiac enzymes and EKG were unremarkable.  LE ultrasound was negative for DVT one week prior to her ED visit.  She followed up in clinic 08/2016 and reported repeated episodes of chest pain, shortness of breath, palpitations and dizziness.  She reported having an ETT in 2015 that was negative for ischemia.  She was referred for ETT 10/15/15 that was negative for ischemia but showed poorly-controlled hypertension.  She also had a 7 day event monitor 08/2015 that showed no arrhythmias despite reports of heart racing, skipped beats and chest pain.  She has had some episodes of documented sinus tachycardia and has been treated with metoprolol, though this was switched to propranolol due to fatigue.  She has been seen in the ED with chest pain quite frequently.  At times her blood pressure has been as high as 210 mmHg.  Her symptoms improved somewhat after she started taking clonazepam.  She was admitted 2/3-2/7 with chest pain.  At the time one troponin was 0.07, though the follow up value was <0.03.  It was discovered that some of her chest pain was attributable to GERD.  Since her last appointment Stacy [name] has been Feeling mostly better. She continues to have chest discomfort but knows that it is due to gastroesophageal reflux disease. She has been working with the gastroenterologist in [place].  She was also diagnosed with sleep apnea and has been on a  CPAP for two months.  She has only used the machine 5 times because she unconsciously takes it off during the night. She's discover that she is a mouth breather and currently has the nasal pillows. She is also order a facemask instead.    Stacy [name] continues to work on weight loss. She's been going to the gym 3 times per week and also has a Physiological scientist. She also has been working on her diet and still is not eating any fried foods, pork, or red meat.  She is excited because her daughter, Stacy [name] just started at [employer].   Past Medical History:  Diagnosis Date  . Abnormal Pap smear    cryo, normal since  . Anxiety    past Hx  . Arrhythmia   . Chest pain 09/14/2009   no pulmonary embolus by chest CT  . Depression    past Hx  . Elevated BP   . Esophageal stricture   . Essential hypertension 09/28/2009   Qualifier: Diagnosis of  By: [name] FNP, [place]    . Fibroid   . Fibroids 09/24/2015  . GERD (gastroesophageal reflux disease)    no meds  . Hypertension   . Palpitations   . Pneumonia   . Sickle cell trait ([place])   . Vaginal Pap smear, abnormal     Past Surgical History:  Procedure Laterality Date  . CARDIAC CATHETERIZATION N/A 01/04/2016   Procedure: Left Heart Cath and Coronary Angiography;  Surgeon: Belva Crome, MD;  Location: [facility];  Service: Cardiovascular;  Laterality: N/A;  . CESAREAN SECTION  2004  . LAPAROSCOPY FOR ECTOPIC PREGNANCY  2001  . WISDOM TOOTH EXTRACTION  2012     Current Outpatient Prescriptions  Medication Sig Dispense Refill  . clonazePAM (KLONOPIN) 0.5 MG tablet Take 0.5 mg by mouth daily as needed for anxiety.    Marland Kitchen esomeprazole (NEXIUM) 20 MG capsule Take 20 mg by mouth daily.     . hydrochlorothiazide (MICROZIDE) 12.5 MG capsule Take 1 capsule (12.5 mg total) by mouth every other day. 90 capsule 3  . propranolol (INDERAL) 20 MG tablet Take 10 mg by mouth 2 (two) times daily.    . ranitidine (ZANTAC) 150 MG tablet  Take 150 mg by mouth at bedtime as needed for heartburn.     No current facility-administered medications for this visit.    Facility-Administered Medications Ordered in Other Visits  Medication Dose Route Frequency Provider Last Rate Last Dose  . iohexol (OMNIPAQUE) 300 MG/ML solution 100 mL  100 mL Intravenous Once PRN Jola Schmidt, MD        Allergies:   Hydromorphone; Morphine; and Diphenhydramine    Social History:  The patient  reports that she has never smoked. She has never used smokeless tobacco. She reports that she does not drink alcohol or use drugs.   Family History:  The patient's family history includes Asthma in her daughter, maternal grandmother, and mother; Heart Problems in her mother; Hypertension in her mother and paternal uncle; Thyroid disease in her mother.    ROS:  Please see the history of present illness.   Otherwise, review of systems are positive for none.   All other systems are reviewed and negative.    PHYSICAL EXAM: VS:  BP 120/79   Pulse 71   Ht 5\' 5"  (1.651 m)   Wt 184 lb 6.4 oz (83.6 kg)   LMP 06/03/2016   BMI 30.69 kg/m  , BMI Body mass index is 30.69 kg/m. GENERAL:  Well appearing HEENT:  Pupils equal round and reactive, fundi not visualized, oral mucosa unremarkable NECK:  No jugular venous distention, waveform within normal limits, carotid upstroke brisk and symmetric, no bruits, no thyromegaly LYMPHATICS:  No cervical adenopathy LUNGS:  Clear to auscultation bilaterally HEART:  RRR.  PMI not displaced or sustained,S1 and S2 within normal limits, no S3, no S4, no clicks, no rubs, no murmurs ABD:  Flat, positive bowel sounds normal in frequency in pitch, no bruits, no rebound, no guarding, no midline pulsatile mass, no hepatomegaly, no splenomegaly EXT:  2 plus pulses throughout, no edema, no cyanosis no clubbing SKIN:  No rashes no nodules NEURO:  Cranial nerves II through XII grossly intact, motor grossly intact throughout PSYCH:   Cognitively intact, oriented to person place and time   EKG:  EKG is not ordered today.  ETT 10/15/15:   Blood pressure demonstrated a hypertensive response to exercise.  Upsloping ST segment depression ST segment depression of 2 mm was noted during stress in the II, III, aVF, V6, V4 and V5 leads.  The patient experienced no angina during the stress test.  Overall, the patient's exercise capacity was normal.  [facility] Treadmill Score:low risk  Negative stress test without evidence of ischemia at given workload. Upsloping ST depression noted - may be due to LVH. Significant hypertension noted with rest BP 186/102 prior to exercise.  Echo 11/09/15: LVEF 76%.  Mild thickening of the mitral  valve.  Trace MR and TR.  RVSP 28 mmHg.  7 Day Event Monitor 09/16/15:  Quality: Fair. Baseline artifact. Predominant rhythm: sinus rhythm Pauses >2.5 seconds: 0 No PVCs or PACs were noted.  Patient did submit a symptom diary. She reported heart racing, skipped beats, and chest pain. At those times the underlying rhythm was sinus rhythm with rates from 70-93 bpm and one episode of sinus tachycardia, rate 113 bpm.  Echo 01/02/16: Study Conclusions  - Left ventricle: The cavity size was normal. Wall thickness was increased in a pattern of mild LVH. Systolic function was normal. The estimated ejection fraction was in the range of 60% to 65%. Wall motion was normal; there were no regional wall motion abnormalities. Doppler parameters are consistent with abnormal left ventricular relaxation (grade 1 diastolic dysfunction). - Mitral valve: There was trivial regurgitation. - Right atrium: Central venous pressure (est): 3 mm Hg. - Tricuspid valve: There was trivial regurgitation. - Pulmonary arteries: PA peak pressure: 10 mm Hg (S). - Pericardium, extracardiac: A trivial pericardial effusion was identified posterior to the heart.  Impressions:  - Mild LVH with LVEF 60-65% and probable  grade 1 diastolic dysfunction with normal LV filling pressure. Trivial mitral and tricuspid regurgitation. Possible trivial posterior pericardial effusion.  LHC 01/04/16:  Normal coronary arteries  Normal left ventricular function and hemodynamics  Chest pain is non-ischemic.   Recent Labs: 08/07/2015: B Natriuretic Peptide 27.2 12/16/2015: TSH 1.193 01/02/2016: Magnesium 2.1 07/21/2016: ALT 10; BUN 10; Creatinine, Ser 0.66; Hemoglobin 11.4; Platelets 388; Potassium 3.2; Sodium 138    Lipid Panel    Component Value Date/Time   CHOL 197 01/02/2016 0249   TRIG 41 01/02/2016 0249   HDL 57 01/02/2016 0249   CHOLHDL 3.5 01/02/2016 0249   VLDL 8 01/02/2016 0249   LDLCALC 132 (H) 01/02/2016 0249      Wt Readings from Last 3 Encounters:  07/22/16 184 lb 6.4 oz (83.6 kg)  07/21/16 183 lb (83 kg)  06/06/16 183 lb (83 kg)      ASSESSMENT AND PLAN:  # Hypertension: Blood pressure is well-controlled.  Continue hydrochlorothiazide and propranolol.   # Chest/arm pain:  Due to GERD.  She will continue to follow up with her gastroenterologist.   # Palpitations: 7 day event monitor did not reveal any abnormalities.  Symptoms improved on propranolol.  I've asked her to start taking it twice a day again.    30 Minutes of direct patient care were spent with Stacy [name] today.  The patient does not have concerns regarding medicines.  The following changes have been made:  no change  Labs/ tests ordered today include:   No orders of the defined types were placed in this encounter.    Disposition:   FU with Braylee Lal C. Oval Linsey, MD in 1 year.   Signed, Skeet Latch, MD  07/22/2016 10:32 AM    [place]

## 2016-08-05 ENCOUNTER — Other Ambulatory Visit: Payer: Self-pay | Admitting: Obstetrics and Gynecology

## 2016-08-05 DIAGNOSIS — N6001 Solitary cyst of right breast: Secondary | ICD-10-CM

## 2016-08-09 ENCOUNTER — Ambulatory Visit
Admission: RE | Admit: 2016-08-09 | Discharge: 2016-08-09 | Disposition: A | Discharge status: Home / Self Care | Payer: Self-pay | Source: Ambulatory Visit | Attending: Obstetrics and Gynecology | Admitting: Obstetrics and Gynecology

## 2016-08-09 DIAGNOSIS — N6001 Solitary cyst of right breast: Secondary | ICD-10-CM

## 2016-08-21 ENCOUNTER — Encounter (HOSPITAL_BASED_OUTPATIENT_CLINIC_OR_DEPARTMENT_OTHER): Payer: Self-pay | Admitting: Emergency Medicine

## 2016-08-21 ENCOUNTER — Emergency Department (HOSPITAL_BASED_OUTPATIENT_CLINIC_OR_DEPARTMENT_OTHER)
Admission: EM | Admit: 2016-08-21 | Discharge: 2016-08-21 | Disposition: A | Discharge status: Home / Self Care | Payer: BLUE CROSS/BLUE SHIELD | Attending: Emergency Medicine | Admitting: Emergency Medicine

## 2016-08-21 DIAGNOSIS — R0789 Other chest pain: Secondary | ICD-10-CM | POA: Diagnosis present

## 2016-08-21 DIAGNOSIS — I10 Essential (primary) hypertension: Secondary | ICD-10-CM | POA: Diagnosis not present

## 2016-08-21 DIAGNOSIS — G4700 Insomnia, unspecified: Secondary | ICD-10-CM

## 2016-08-21 LAB — COMPREHENSIVE METABOLIC PANEL
ALK PHOS: 45 U/L (ref 38–126)
ALT: 10 U/L — AB (ref 14–54)
AST: 15 U/L (ref 15–41)
Albumin: 3.6 g/dL (ref 3.5–5.0)
Anion gap: 7 (ref 5–15)
BILIRUBIN TOTAL: 0.4 mg/dL (ref 0.3–1.2)
BUN: 13 mg/dL (ref 6–20)
CALCIUM: 9 mg/dL (ref 8.9–10.3)
CHLORIDE: 104 mmol/L (ref 101–111)
CO2: 25 mmol/L (ref 22–32)
CREATININE: 0.76 mg/dL (ref 0.44–1.00)
Glucose, Bld: 93 mg/dL (ref 65–99)
Potassium: 3.7 mmol/L (ref 3.5–5.1)
Sodium: 136 mmol/L (ref 135–145)
TOTAL PROTEIN: 7.7 g/dL (ref 6.5–8.1)

## 2016-08-21 LAB — CBC WITH DIFFERENTIAL/PLATELET
Basophils Absolute: 0 10*3/uL (ref 0.0–0.1)
Basophils Relative: 0 %
EOS PCT: 3 %
Eosinophils Absolute: 0.1 10*3/uL (ref 0.0–0.7)
HEMATOCRIT: 34 % — AB (ref 36.0–46.0)
Hemoglobin: 12.1 g/dL (ref 12.0–15.0)
LYMPHS ABS: 1.4 10*3/uL (ref 0.7–4.0)
LYMPHS PCT: 33 %
MCH: 28.4 pg (ref 26.0–34.0)
MCHC: 35.6 g/dL (ref 30.0–36.0)
MCV: 79.8 fL (ref 78.0–100.0)
Monocytes Absolute: 0.5 10*3/uL (ref 0.1–1.0)
Monocytes Relative: 12 %
NEUTROS ABS: 2.3 10*3/uL (ref 1.7–7.7)
Neutrophils Relative %: 52 %
PLATELETS: 342 10*3/uL (ref 150–400)
RBC: 4.26 MIL/uL (ref 3.87–5.11)
RDW: 13.1 % (ref 11.5–15.5)
WBC: 4.3 10*3/uL (ref 4.0–10.5)

## 2016-08-21 LAB — TROPONIN I: Troponin I: <0.03 ng/mL

## 2016-08-21 MED ORDER — ESZOPICLONE 1 MG PO TABS: 1.0000 mg | ORAL_TABLET | Freq: Every evening | ORAL | 0 refills | Status: DC | PRN

## 2016-08-21 NOTE — ED Notes (Signed)
Pt resting comfortably. Updated on progress. No needs voiced at this time.

## 2016-08-21 NOTE — ED Triage Notes (Signed)
Pt states since Friday she has "just felt weird" with central and upper left chest pains, intermittent, radiating down left arm and bilateral upper back pain.  Pt reports she uses a cpap machine at night but did not use on friday and since then she hasn't felt right.

## 2016-08-21 NOTE — ED Provider Notes (Signed)
[facility] Provider Note   CSN: [id number] Arrival date & time: 08/21/16  0901     History   Chief Complaint Chief Complaint  Patient presents with  . Chest Pain    HPI Stacy [name] is a 45 y.o. female.  HPI Patient presents with fatigue and feeling strange since Friday.  Has had long history of noncardiac chest pain.  Has had echoes and cardiac catheter which showed no coronary artery disease.  Felt fatigued over the last few days.  Versus C Pap machine normally but has not over the last 9 or 2.  She has had trouble sleeping for quite some time. Past Medical History:  Diagnosis Date  . Abnormal Pap smear    cryo, normal since  . Anxiety    past Hx  . Arrhythmia   . Chest pain 09/14/2009   no pulmonary embolus by chest CT  . Depression    past Hx  . Elevated BP   . Esophageal stricture   . Essential hypertension 09/28/2009   Qualifier: Diagnosis of  By: [name] FNP, [place]    . Fibroid   . Fibroids 09/24/2015  . GERD (gastroesophageal reflux disease)    no meds  . Hypertension   . Palpitations   . Pneumonia   . Sickle cell trait ([place])   . Vaginal Pap smear, abnormal     Patient Active Problem List   Diagnosis Date Noted  . Family history of cardiovascular disease 03/21/2016  . Obstructive apnea 03/09/2016  . Abnormal uterine bleeding 02/22/2016  . Anxiety about health 02/22/2016  . Gastro-esophageal reflux disease without esophagitis 02/22/2016  . Fast heart beat 02/22/2016  . Fibroid 02/22/2016  . Elevated troponin   . Chest pain 01/01/2016  . Major depressive disorder ([place]) 10/26/2015  . 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 Fibroids 09/24/2015  . Pain, joint, multiple sites 09/22/2015  . Visual disturbance 09/22/2015  . Numbness 09/22/2015  . Dyspnea 08/10/2015  . Obesity (BMI 30-39.9) 04/17/2015  . Environmental allergies 04/17/2015  . Thyromegaly 10/01/2014  . Edema 11/07/2013  . Atypical chest pain 04/30/2013  .  Other fatigue 05/24/2010  . VITAMIN D DEFICIENCY 09/30/2009  . PALPITATIONS, RECURRENT 09/30/2009  . CHEST PAIN, ATYPICAL, HX OF 09/30/2009  . Anxiety state 09/28/2009  . Depression with anxiety 09/28/2009  . GERD 09/28/2009  . Essential hypertension 09/28/2009    Past Surgical History:  Procedure Laterality Date  . CARDIAC CATHETERIZATION N/A 01/04/2016   Procedure: Left Heart Cath and Coronary Angiography;  Surgeon: Belva Crome, MD;  Location: [facility];  Service: Cardiovascular;  Laterality: N/A;  . CESAREAN SECTION  2004  . LAPAROSCOPY FOR ECTOPIC PREGNANCY  2001  . WISDOM TOOTH EXTRACTION  2012    OB History    Gravida Para Term Preterm AB Living   7 2 2  0 3 2   SAB TAB Ectopic Multiple Live Births   0 0 3 0         Home Medications    Prior to Admission medications   Medication Sig Start Date End Date Taking? Authorizing Provider  clonazePAM (KLONOPIN) 0.5 MG tablet Take 0.5 mg by mouth daily as needed for anxiety.    Historical Provider, MD  esomeprazole (NEXIUM) 20 MG capsule Take 20 mg by mouth daily.     Historical Provider, MD  eszopiclone (LUNESTA) 1 MG TABS tablet Take 1 tablet (1 mg total) by mouth at bedtime as needed for sleep. Take immediately before  bedtime 08/21/16   Leonard Schwartz, MD  hydrochlorothiazide (MICROZIDE) 12.5 MG capsule Take 1 capsule (12.5 mg total) by mouth every other day. 07/22/16   Skeet Latch, MD  propranolol (INDERAL) 20 MG tablet Take 10 mg by mouth 2 (two) times daily.    Historical Provider, MD  ranitidine (ZANTAC) 150 MG tablet Take 150 mg by mouth at bedtime as needed for heartburn.    Historical Provider, MD    Family History Family History  Problem Relation Age of Onset  . Asthma Mother   . Hypertension Mother   . Thyroid disease Mother   . Heart Problems Mother   . Hypertension Paternal Uncle   . Asthma Maternal Grandmother   . Asthma Daughter   . Colon cancer Neg Hx   . Esophageal cancer Neg Hx   . Stomach  cancer Neg Hx     Social History Social History  Substance Use Topics  . Smoking status: Never Smoker  . Smokeless tobacco: Never Used  . Alcohol use No     Allergies   Hydromorphone; Morphine; and Diphenhydramine   Review of Systems Review of Systems All other systems reviewed and are negative  Physical Exam Updated Vital Signs BP 107/68   Pulse 70   Temp 98.8 F (37.1 C) (Oral)   Resp 14   Ht 5\' 6"  (1.676 m)   Wt 185 lb (83.9 kg)   LMP 07/28/2016 (Approximate)   SpO2 100%   Breastfeeding? No   BMI 29.86 kg/m   Physical Exam Physical Exam  Nursing note and vitals reviewed. Constitutional: She is oriented to person, place, and time. She appears well-developed and well-nourished. No distress.  HENT:  Head: Normocephalic and atraumatic.  Eyes: Pupils are equal, round, and reactive to light.  Neck: Normal range of motion.  Cardiovascular: Normal rate and intact distal pulses.   Pulmonary/Chest: No respiratory distress.  Abdominal: Normal appearance. She exhibits no distension.  Musculoskeletal: Normal range of motion.  Neurological: She is alert and oriented to person, place, and time. No cranial nerve deficit.  Skin: Skin is warm and dry. No rash noted.  Psychiatric: She has a normal mood and affect. Her behavior is normal.    ED Treatments / Results  Labs (all labs ordered are listed, but only abnormal results are displayed) Labs Reviewed  CBC WITH DIFFERENTIAL/PLATELET - Abnormal; Notable for the following:       Result Value   HCT 34.0 (*)    All other components within normal limits  COMPREHENSIVE METABOLIC PANEL - Abnormal; Notable for the following:    ALT 10 (*)    All other components within normal limits  TROPONIN I    EKG  EKG Interpretation  Date/Time:  Sunday August 21 2016 09:17:39 EDT Ventricular Rate:  85 PR Interval:    QRS Duration: 98 QT Interval:  364 QTC Calculation: 433 R Axis:   31 Text Interpretation:  Sinus rhythm  Anteroseptal infarct, age indeterminate No significant change since last tracing Confirmed by Zaylei Mullane  MD, Soila Printup ([id number]) on 08/21/2016 9:36:09 AM       Radiology No results found.  Procedures Procedures (including critical care time)  Medications Ordered in ED Medications - No data to display   Initial Impression / Assessment and Plan / ED Course  I have reviewed the triage vital signs and the nursing notes.  Pertinent labs & imaging results that were available during my care of the patient were reviewed by me and considered in my medical  decision making (see chart for details).  Clinical Course      Final Clinical Impressions(s) / ED Diagnoses   Final diagnoses:  Atypical chest pain  Insomnia    New Prescriptions New Prescriptions   ESZOPICLONE (LUNESTA) 1 MG TABS TABLET    Take 1 tablet (1 mg total) by mouth at bedtime as needed for sleep. Take immediately before bedtime     Leonard Schwartz, MD 08/21/16 1225

## 2016-10-07 ENCOUNTER — Emergency Department (HOSPITAL_BASED_OUTPATIENT_CLINIC_OR_DEPARTMENT_OTHER): Payer: BLUE CROSS/BLUE SHIELD

## 2016-10-07 ENCOUNTER — Encounter (HOSPITAL_BASED_OUTPATIENT_CLINIC_OR_DEPARTMENT_OTHER): Payer: Self-pay | Admitting: *Deleted

## 2016-10-07 ENCOUNTER — Emergency Department (HOSPITAL_BASED_OUTPATIENT_CLINIC_OR_DEPARTMENT_OTHER)
Admission: EM | Admit: 2016-10-07 | Discharge: 2016-10-07 | Disposition: A | Discharge status: Home / Self Care | Payer: BLUE CROSS/BLUE SHIELD | Attending: Emergency Medicine | Admitting: Emergency Medicine

## 2016-10-07 DIAGNOSIS — Z79899 Other long term (current) drug therapy: Secondary | ICD-10-CM | POA: Diagnosis not present

## 2016-10-07 DIAGNOSIS — I10 Essential (primary) hypertension: Secondary | ICD-10-CM | POA: Diagnosis not present

## 2016-10-07 DIAGNOSIS — M79605 Pain in left leg: Secondary | ICD-10-CM | POA: Diagnosis not present

## 2016-10-07 DIAGNOSIS — R0789 Other chest pain: Secondary | ICD-10-CM | POA: Insufficient documentation

## 2016-10-07 LAB — CBC WITH DIFFERENTIAL/PLATELET
BASOS PCT: 0 %
Basophils Absolute: 0 10*3/uL (ref 0.0–0.1)
EOS ABS: 0.1 10*3/uL (ref 0.0–0.7)
Eosinophils Relative: 3 %
HEMATOCRIT: 33.4 % — AB (ref 36.0–46.0)
HEMOGLOBIN: 11.8 g/dL — AB (ref 12.0–15.0)
LYMPHS ABS: 1.6 10*3/uL (ref 0.7–4.0)
Lymphocytes Relative: 35 %
MCH: 28.6 pg (ref 26.0–34.0)
MCHC: 35.3 g/dL (ref 30.0–36.0)
MCV: 80.9 fL (ref 78.0–100.0)
MONOS PCT: 9 %
Monocytes Absolute: 0.4 10*3/uL (ref 0.1–1.0)
NEUTROS ABS: 2.4 10*3/uL (ref 1.7–7.7)
NEUTROS PCT: 53 %
Platelets: 341 10*3/uL (ref 150–400)
RBC: 4.13 MIL/uL (ref 3.87–5.11)
RDW: 13.1 % (ref 11.5–15.5)
WBC: 4.5 10*3/uL (ref 4.0–10.5)

## 2016-10-07 LAB — BASIC METABOLIC PANEL
Anion gap: 6 (ref 5–15)
BUN: 8 mg/dL (ref 6–20)
CHLORIDE: 105 mmol/L (ref 101–111)
CO2: 26 mmol/L (ref 22–32)
Calcium: 9 mg/dL (ref 8.9–10.3)
Creatinine, Ser: 0.72 mg/dL (ref 0.44–1.00)
GFR calc non Af Amer: >60 mL/min (ref >60)
Glucose, Bld: 86 mg/dL (ref 65–99)
POTASSIUM: 3.5 mmol/L (ref 3.5–5.1)
SODIUM: 137 mmol/L (ref 135–145)

## 2016-10-07 LAB — D-DIMER, QUANTITATIVE: D-Dimer, Quant: 0.82 ug/mL-FEU — ABNORMAL HIGH (ref 0.00–0.50)

## 2016-10-07 LAB — TROPONIN I

## 2016-10-07 LAB — PREGNANCY, URINE: Preg Test, Ur: NEGATIVE

## 2016-10-07 MED ORDER — IOPAMIDOL (ISOVUE-370) INJECTION 76%
100.0000 mL | Freq: Once | INTRAVENOUS | Status: AC | PRN
  Administered 2016-10-07: 100 mL via INTRAVENOUS

## 2016-10-07 MED ORDER — GI COCKTAIL ~~LOC~~
30.0000 mL | Freq: Once | ORAL | Status: AC
  Administered 2016-10-07: 30 mL via ORAL
  Filled 2016-10-07: qty 30

## 2016-10-07 NOTE — ED Provider Notes (Signed)
[facility] Provider Note   CSN: [id number] Arrival date & time: 10/07/16  0857     History   Chief Complaint Chief Complaint  Patient presents with  . Chest Pain    HPI Stacy [name] is a 45 y.o. female.  HPI  45 year old female who presents with chest pain. She has a history of hypertension, esophagitis, GERD and history of recurrent atypical chest pain. Admission in 12/2015 for this with reassuring LHC and ECHO. States she has had significant lifestyle modifications, including diet changes, weight loss, psychiatric counseling, treatment for OSA. Overall she has had less frequent episodes of chest pain. Over past week, states she has been more nervous and jittery for no good reason. States she checked her pulse yesterday and it was 130. This morning when she woke up it was 145. Took dose of propranolol, and discussed with her cardiologists office, who told her to come to ED for evaluation.  Reports associating chest tightness and pressure with minimal sob. No syncope or near syncope. Also states left calf pain this morning, which is new for her. No recent immobilization or traveling, but states she sits at a computer a lot and drives a lot. No prior history of PE/DVT, OCPs or exogenous hormones, or strong family history of thromboembolic disease. No LE edema. States she did not sleep with CPAP last night, which can make her feel this way.  Past Medical History:  Diagnosis Date  . Abnormal Pap smear    cryo, normal since  . Anxiety    past Hx  . Arrhythmia   . Chest pain 09/14/2009   no pulmonary embolus by chest CT  . Depression    past Hx  . Elevated BP   . Esophageal stricture   . Essential hypertension 09/28/2009   Qualifier: Diagnosis of  By: [name] FNP, [place]    . Fibroid   . Fibroids 09/24/2015  . GERD (gastroesophageal reflux disease)    no meds  . Hypertension   . Palpitations   . Pneumonia   . Sickle cell trait ([place])   . Vaginal Pap  smear, abnormal     Patient Active Problem List   Diagnosis Date Noted  . Family history of cardiovascular disease 03/21/2016  . Obstructive apnea 03/09/2016  . Abnormal uterine bleeding 02/22/2016  . Anxiety about health 02/22/2016  . Gastro-esophageal reflux disease without esophagitis 02/22/2016  . Fast heart beat 02/22/2016  . Fibroid 02/22/2016  . Elevated troponin   . Chest pain 01/01/2016  . Major depressive disorder 10/26/2015  . 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 Fibroids 09/24/2015  . Pain, joint, multiple sites 09/22/2015  . Visual disturbance 09/22/2015  . Numbness 09/22/2015  . Dyspnea 08/10/2015  . Obesity (BMI 30-39.9) 04/17/2015  . Environmental allergies 04/17/2015  . Thyromegaly 10/01/2014  . Edema 11/07/2013  . Atypical chest pain 04/30/2013  . Other fatigue 05/24/2010  . VITAMIN D DEFICIENCY 09/30/2009  . PALPITATIONS, RECURRENT 09/30/2009  . CHEST PAIN, ATYPICAL, HX OF 09/30/2009  . Anxiety state 09/28/2009  . Depression with anxiety 09/28/2009  . GERD 09/28/2009  . Essential hypertension 09/28/2009    Past Surgical History:  Procedure Laterality Date  . CARDIAC CATHETERIZATION N/A 01/04/2016   Procedure: Left Heart Cath and Coronary Angiography;  Surgeon: Belva Crome, MD;  Location: [facility];  Service: Cardiovascular;  Laterality: N/A;  . CESAREAN SECTION  2004  . LAPAROSCOPY FOR ECTOPIC PREGNANCY  2001  . [place] EXTRACTION  2012  OB History    Gravida Para Term Preterm AB Living   7 2 2  0 3 2   SAB TAB Ectopic Multiple Live Births   0 0 3 0         Home Medications    Prior to Admission medications   Medication Sig Start Date End Date Taking? Authorizing Provider  clonazePAM (KLONOPIN) 0.5 MG tablet Take 0.5 mg by mouth daily as needed for anxiety.    Historical Provider, MD  esomeprazole (NEXIUM) 20 MG capsule Take 20 mg by mouth daily.     Historical Provider, MD  hydrochlorothiazide (MICROZIDE) 12.5 MG capsule  Take 1 capsule (12.5 mg total) by mouth every other day. 07/22/16   Skeet Latch, MD  propranolol (INDERAL) 20 MG tablet Take 10 mg by mouth 2 (two) times daily.    Historical Provider, MD  ranitidine (ZANTAC) 150 MG tablet Take 150 mg by mouth at bedtime as needed for heartburn.    Historical Provider, MD    Family History Family History  Problem Relation Age of Onset  . Asthma Mother   . Hypertension Mother   . Thyroid disease Mother   . Heart Problems Mother   . Hypertension Paternal Uncle   . Asthma Maternal Grandmother   . Asthma Daughter   . Colon cancer Neg Hx   . Esophageal cancer Neg Hx   . Stomach cancer Neg Hx     Social History Social History  Substance Use Topics  . Smoking status: Never Smoker  . Smokeless tobacco: Never Used  . Alcohol use No     Allergies   Hydromorphone; Morphine; and Diphenhydramine   Review of Systems Review of Systems 10/14 systems reviewed and are negative other than those stated in the HPI   Physical Exam Updated Vital Signs BP 99/67 (BP Location: Left Arm)   Pulse 64   Temp 99.3 F (37.4 C) (Oral)   Resp 18   Ht 5\' 6"  (1.676 m)   Wt 179 lb (81.2 kg)   LMP 09/26/2016   SpO2 100%   BMI 28.89 kg/m   Physical Exam Physical Exam  Nursing note and vitals reviewed. Constitutional: Well developed, well nourished, non-toxic, and in no acute distress Head: Normocephalic and atraumatic.  Mouth/Throat: Oropharynx is clear and moist.  Neck: Normal range of motion. Neck supple.  Cardiovascular: Normal rate and regular rhythm.   Pulmonary/Chest: Effort normal and breath sounds normal. no chest wall tenderness. Abdominal: Soft. There is no tenderness. There is no rebound and no guarding.  Musculoskeletal: Normal range of motion. Mild left calf tenderness to palpation. Neurological: Alert, no facial droop, fluent speech, moves all extremities symmetrically Skin: Skin is warm and dry.  Psychiatric: Cooperative   ED  Treatments / Results  Labs (all labs ordered are listed, but only abnormal results are displayed) Labs Reviewed  D-DIMER, QUANTITATIVE (NOT AT [facility]) - Abnormal; Notable for the following:       Result Value   D-Dimer, Quant 0.82 (*)    All other components within normal limits  CBC WITH DIFFERENTIAL/PLATELET - Abnormal; Notable for the following:    Hemoglobin 11.8 (*)    HCT 33.4 (*)    All other components within normal limits  PREGNANCY, URINE  BASIC METABOLIC PANEL  TROPONIN I    EKG  EKG Interpretation  Date/Time:  Friday October 07 2016 09:06:45 EST Ventricular Rate:  66 PR Interval:    QRS Duration: 103 QT Interval:  378 QTC Calculation: 396 R  Axis:   33 Text Interpretation:  Sinus rhythm RSR' in V1 or V2, probably normal variant No significant change since last tracing Confirmed by Eriel Doyon MD, Samamtha Tiegs [phone]) on 10/07/2016 9:23:09 AM       Radiology Dg Chest 2 View  Result Date: 10/07/2016 CLINICAL DATA:  Shortness of breath. EXAM: CHEST  2 VIEW COMPARISON:  06/22/2016 . FINDINGS: Mediastinum hilar structures normal. Lungs are clear. No pleural effusion pneumothorax. Heart size stable . No acute bony abnormality. IMPRESSION: No acute cardiopulmonary disease. Electronically Signed   By: Marcello Moores  Register   On: 10/07/2016 09:45   Ct Angio Chest Pe W And/or Wo Contrast  Result Date: 10/07/2016 CLINICAL DATA:  Chest tightness and pressure with some shortness of breath. Left calf pain this morning. EXAM: CT ANGIOGRAPHY CHEST WITH CONTRAST TECHNIQUE: Multidetector CT imaging of the chest was performed using the standard protocol during bolus administration of intravenous contrast. Multiplanar CT image reconstructions and MIPs were obtained to evaluate the vascular anatomy. CONTRAST:  100 mL of Isovue 370 intravenous contrast. COMPARISON:  02/04/2016 FINDINGS: Cardiovascular: Satisfactory opacification of the pulmonary arteries to the segmental level. No evidence of pulmonary  embolism. Normal heart size. No pericardial effusion. Thoracic aorta is normal in caliber. No dissection. No plaque. No coronary artery calcifications. Mediastinum/Nodes: No enlarged mediastinal, hilar, or axillary lymph nodes. Thyroid gland, trachea, and esophagus demonstrate no significant findings. Lungs/Pleura: Lungs are clear. No pleural effusion or pneumothorax. Upper Abdomen: Unremarkable Musculoskeletal: No chest wall abnormality. No acute or significant osseous findings. Review of the MIP images confirms the above findings. IMPRESSION: 1. No evidence of a pulmonary embolus. No acute findings. Normal exam. Electronically Signed   By: Lajean Manes M.D.   On: 10/07/2016 11:46   US Venous Img Lower Unilateral Left  Result Date: 10/07/2016 CLINICAL DATA:  45 year old female with a history of left calf tenderness and chest pain EXAM: LEFT LOWER EXTREMITY VENOUS DOPPLER ULTRASOUND TECHNIQUE: Gray-scale sonography with graded compression, as well as color Doppler and duplex ultrasound were performed to evaluate the lower extremity deep venous systems from the level of the common femoral vein and including the common femoral, femoral, profunda femoral, popliteal and calf veins including the posterior tibial, peroneal and gastrocnemius veins when visible. The superficial great saphenous vein was also interrogated. Spectral Doppler was utilized to evaluate flow at rest and with distal augmentation maneuvers in the common femoral, femoral and popliteal veins. COMPARISON:  None. FINDINGS: Contralateral Common Femoral Vein: Respiratory phasicity is normal and symmetric with the symptomatic side. No evidence of thrombus. Normal compressibility. Common Femoral Vein: No evidence of thrombus. Normal compressibility, respiratory phasicity and response to augmentation. Saphenofemoral Junction: No evidence of thrombus. Normal compressibility and flow on color Doppler imaging. Profunda Femoral Vein: No evidence of  thrombus. Normal compressibility and flow on color Doppler imaging. Femoral Vein: No evidence of thrombus. Normal compressibility, respiratory phasicity and response to augmentation. Popliteal Vein: No evidence of thrombus. Normal compressibility, respiratory phasicity and response to augmentation. Calf Veins: No evidence of thrombus. Normal compressibility and flow on color Doppler imaging. Superficial Great Saphenous Vein: No evidence of thrombus. Normal compressibility and flow on color Doppler imaging. Other Findings:  None. IMPRESSION: Sonographic survey of the left lower extremity negative for DVT. Signed, Dulcy Fanny. Earleen Newport, DO [facility] Electronically Signed   By: Corrie Mckusick D.O.   On: 10/07/2016 11:48    Procedures Procedures (including critical care time)  Medications Ordered in ED Medications  gi  cocktail (Maalox,Lidocaine,Donnatal) (30 mLs Oral Given 10/07/16 0948)  iopamidol (ISOVUE-370) 76 % injection 100 mL (100 mLs Intravenous Contrast Given 10/07/16 1129)     Initial Impression / Assessment and Plan / ED Course  I have reviewed the triage vital signs and the nursing notes.  Pertinent labs & imaging results that were available during my care of the patient were reviewed by me and considered in my medical decision making (see chart for details).  Clinical Course    On chart review, multiple ED visits for atypical chest pain including admission in 12/2015 for LHC and ECHO which showed normal coronaries and normal EF. Double ACS. Symptoms still seem atypical for that. EKG is not ischemic and troponin is normal.  Will send d-dimer given reported calf tenderness this morning, and tachycardia prior to presentation on her home pulse ox monitor (but no tachycardia here). D-dimer is positive and she underwent CT angiogram of the chest. This is negative for PE or any other acute intrathoracic processes. Ultrasound of her left  lower extremity is also negative for DVT.  Patient reassured, with resolving symptoms here in the ED. At this time I do not feel that she requires any further workup may continue outpatient workup as needed with her PCP and cardiologist.  The patient appears reasonably screened and/or stabilized for discharge and I doubt any other medical condition or other [facility] requiring further screening, evaluation, or treatment in the ED at this time prior to discharge.  Strict return and follow-up instructions reviewed. She expressed understanding of all discharge instructions and felt comfortable with the plan of care.   Final Clinical Impressions(s) / ED Diagnoses   Final diagnoses:  Atypical chest pain    New Prescriptions New Prescriptions   No medications on file     Forde Dandy, MD 10/07/16 1204

## 2016-10-07 NOTE — Discharge Instructions (Signed)
Your chest pain workup was very reassuring today. Your heart markers were negative, her CT does not show blood clot or any concerning processes, and ultrasound of her leg was negative for blood clot.  Please return without fail for worsening symptoms including escalating pain, passing out, difficulty breathing, or any other symptoms concerning to you. Please call both her cardiologist and your primary care doctor for ongoing follow-up.

## 2016-10-07 NOTE — ED Triage Notes (Signed)
Pt reports feeling "jittery" for the last few days, this am developed chest pressure to the left and center chest areas, called her cardiologist and was told to come to ed. Denies sob or any other c/o. ekg performed while pt being triaged.

## 2016-11-06 ENCOUNTER — Emergency Department (HOSPITAL_BASED_OUTPATIENT_CLINIC_OR_DEPARTMENT_OTHER)
Admission: EM | Admit: 2016-11-06 | Discharge: 2016-11-06 | Disposition: A | Discharge status: Home / Self Care | Payer: BLUE CROSS/BLUE SHIELD | Attending: Emergency Medicine | Admitting: Emergency Medicine

## 2016-11-06 ENCOUNTER — Emergency Department (HOSPITAL_BASED_OUTPATIENT_CLINIC_OR_DEPARTMENT_OTHER): Payer: BLUE CROSS/BLUE SHIELD

## 2016-11-06 ENCOUNTER — Encounter (HOSPITAL_BASED_OUTPATIENT_CLINIC_OR_DEPARTMENT_OTHER): Payer: Self-pay | Admitting: Emergency Medicine

## 2016-11-06 DIAGNOSIS — R0602 Shortness of breath: Secondary | ICD-10-CM | POA: Insufficient documentation

## 2016-11-06 DIAGNOSIS — K30 Functional dyspepsia: Secondary | ICD-10-CM | POA: Insufficient documentation

## 2016-11-06 DIAGNOSIS — R0789 Other chest pain: Secondary | ICD-10-CM | POA: Diagnosis not present

## 2016-11-06 DIAGNOSIS — I10 Essential (primary) hypertension: Secondary | ICD-10-CM | POA: Diagnosis not present

## 2016-11-06 DIAGNOSIS — R079 Chest pain, unspecified: Secondary | ICD-10-CM | POA: Diagnosis present

## 2016-11-06 DIAGNOSIS — Z79899 Other long term (current) drug therapy: Secondary | ICD-10-CM | POA: Diagnosis not present

## 2016-11-06 LAB — CBC WITH DIFFERENTIAL/PLATELET
Basophils Absolute: 0 10*3/uL (ref 0.0–0.1)
Basophils Relative: 0 %
Eosinophils Absolute: 0.1 10*3/uL (ref 0.0–0.7)
Eosinophils Relative: 2 %
HCT: 31.3 % — ABNORMAL LOW (ref 36.0–46.0)
Hemoglobin: 11 g/dL — ABNORMAL LOW (ref 12.0–15.0)
Lymphocytes Relative: 37 %
Lymphs Abs: 1.8 10*3/uL (ref 0.7–4.0)
MCH: 28.7 pg (ref 26.0–34.0)
MCHC: 35.1 g/dL (ref 30.0–36.0)
MCV: 81.7 fL (ref 78.0–100.0)
Monocytes Absolute: 0.7 10*3/uL (ref 0.1–1.0)
Monocytes Relative: 14 %
Neutro Abs: 2.3 10*3/uL (ref 1.7–7.7)
Neutrophils Relative %: 47 %
Platelets: 319 10*3/uL (ref 150–400)
RBC: 3.83 MIL/uL — ABNORMAL LOW (ref 3.87–5.11)
RDW: 13.1 % (ref 11.5–15.5)
WBC: 4.9 10*3/uL (ref 4.0–10.5)

## 2016-11-06 LAB — BASIC METABOLIC PANEL
Anion gap: 7 (ref 5–15)
BUN: 9 mg/dL (ref 6–20)
CO2: 22 mmol/L (ref 22–32)
Calcium: 8.8 mg/dL — ABNORMAL LOW (ref 8.9–10.3)
Chloride: 108 mmol/L (ref 101–111)
Creatinine, Ser: 0.69 mg/dL (ref 0.44–1.00)
GFR calc Af Amer: >60 mL/min (ref >60)
GFR calc non Af Amer: >60 mL/min (ref >60)
Glucose, Bld: 100 mg/dL — ABNORMAL HIGH (ref 65–99)
Potassium: 3.8 mmol/L (ref 3.5–5.1)
Sodium: 137 mmol/L (ref 135–145)

## 2016-11-06 LAB — TROPONIN I: Troponin I: <0.03 ng/mL (ref <0.03)

## 2016-11-06 MED ORDER — OMEPRAZOLE 20 MG PO CPDR: 20.0000 mg | DELAYED_RELEASE_CAPSULE | Freq: Every day | ORAL | 0 refills | Status: DC

## 2016-11-06 NOTE — ED Notes (Signed)
Chest pain at 0200 this am and felt like heart was racing at 110, and having fatigue while walking down the stairs, SOB earlier

## 2016-11-06 NOTE — ED Provider Notes (Signed)
[facility] Provider Note   CSN: [id number] Arrival date & time: 11/06/16  1220     History   Chief Complaint Chief Complaint  Patient presents with  . Chest Pain    HPI Stacy [name] is a 45 y.o. female.  HPI   45 year old female with a history of hypertension, obesity, anxiety and depression presents today with complaints of chest pain. Patient reports that she was laying in bed this morning around 2 AM when she starts to feel her heart race with associated centralized chest pain. She notes the symptoms waxed and waned throughout the evening into the morning. She notes a history of the same for which she is followed by Clement Husbands of cardiology. She notes she has been diagnosed with tachycardia in the past and was started on propanolol for palpitations. She notes this felt similar to previous episodes and thought it would go away. Patient does note a history of GERD, reports this felt similar as well. After getting up this morning she felt "exhausted" and had extreme full-body exhaustion with trying to go up and down her stairs. She reports very minor shortness of breath associated with her activity. Patient called her cardiologist who instructed her to come to the emergency room for initial evaluation, and scheduled a follow-up appointment in the office tomorrow. Patient denies any chest pain presently, reports that she is still feeling tired. She notes that while talking to [place] here in the emergency room her heart rate began to go up and then goes back down. She denies any lower extremity swelling or edema, she denies any cough, fever or upper respiratory congestion.  Patient was admitted to the hospital in February 2017 for chest pain in the setting of elevated cardiac enzymes. Patient had a echo performed which showed normal ejection fraction, she underwent cardiac cath which showed no significant obstructions. Her initial troponin was elevated at 0.07, subsequent  troponins were negative. At that time she was noted to have an ectopic pregnancy. Patient was discharged on February 7, with no suspicion that her chest pain was likely noncardiac in origin with a differential of coronary spasm versus lab error.  Past Medical History:  Diagnosis Date  . Abnormal Pap smear    cryo, normal since  . Anxiety    past Hx  . Arrhythmia   . Chest pain 09/14/2009   no pulmonary embolus by chest CT  . Depression    past Hx  . Elevated BP   . Esophageal stricture   . Essential hypertension 09/28/2009   Qualifier: Diagnosis of  By: [name] FNP, [place]    . Fibroid   . Fibroids 09/24/2015  . GERD (gastroesophageal reflux disease)    no meds  . Hypertension   . Palpitations   . Pneumonia   . Sickle cell trait ([place])   . Vaginal Pap smear, abnormal     Patient Active Problem List   Diagnosis Date Noted  . Family history of cardiovascular disease 03/21/2016  . Obstructive apnea 03/09/2016  . Abnormal uterine bleeding 02/22/2016  . Anxiety about health 02/22/2016  . Gastro-esophageal reflux disease without esophagitis 02/22/2016  . Fast heart beat 02/22/2016  . Fibroid 02/22/2016  . Elevated troponin   . Chest pain 01/01/2016  . Major depressive disorder 10/26/2015  . 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 Fibroids 09/24/2015  . Pain, joint, multiple sites 09/22/2015  . Visual disturbance 09/22/2015  . Numbness 09/22/2015  . Dyspnea 08/10/2015  . Obesity (BMI 30-39.9)  04/17/2015  . Environmental allergies 04/17/2015  . Thyromegaly 10/01/2014  . Edema 11/07/2013  . Atypical chest pain 04/30/2013  . Other fatigue 05/24/2010  . VITAMIN D DEFICIENCY 09/30/2009  . PALPITATIONS, RECURRENT 09/30/2009  . CHEST PAIN, ATYPICAL, HX OF 09/30/2009  . Anxiety state 09/28/2009  . Depression with anxiety 09/28/2009  . GERD 09/28/2009  . Essential hypertension 09/28/2009    Past Surgical History:  Procedure Laterality Date  . CARDIAC CATHETERIZATION  N/A 01/04/2016   Procedure: Left Heart Cath and Coronary Angiography;  Surgeon: Belva Crome, MD;  Location: [facility];  Service: Cardiovascular;  Laterality: N/A;  . CESAREAN SECTION  2004  . LAPAROSCOPY FOR ECTOPIC PREGNANCY  2001  . WISDOM TOOTH EXTRACTION  2012    OB History    Gravida Para Term Preterm AB Living   7 2 2  0 3 2   SAB TAB Ectopic Multiple Live Births   0 0 3 0         Home Medications    Prior to Admission medications   Medication Sig Start Date End Date Taking? Authorizing Provider  clonazePAM (KLONOPIN) 0.5 MG tablet Take 0.5 mg by mouth daily as needed for anxiety.   Yes Historical Provider, MD  esomeprazole (NEXIUM) 20 MG capsule Take 20 mg by mouth daily.    Yes Historical Provider, MD  hydrochlorothiazide (MICROZIDE) 12.5 MG capsule Take 1 capsule (12.5 mg total) by mouth every other day. 07/22/16  Yes Skeet Latch, MD  propranolol (INDERAL) 20 MG tablet Take 10 mg by mouth 2 (two) times daily.   Yes Historical Provider, MD  ranitidine (ZANTAC) 150 MG tablet Take 150 mg by mouth at bedtime as needed for heartburn.   Yes Historical Provider, MD  omeprazole (PRILOSEC) 20 MG capsule Take 1 capsule (20 mg total) by mouth daily. 11/06/16   Okey Regal, PA-C    Family History Family History  Problem Relation Age of Onset  . Asthma Mother   . Hypertension Mother   . Thyroid disease Mother   . Heart Problems Mother   . Hypertension Paternal Uncle   . Asthma Maternal Grandmother   . Asthma Daughter   . Colon cancer Neg Hx   . Esophageal cancer Neg Hx   . Stomach cancer Neg Hx     Social History Social History  Substance Use Topics  . Smoking status: Never Smoker  . Smokeless tobacco: Never Used  . Alcohol use No     Allergies   Hydromorphone; Morphine; and Diphenhydramine   Review of Systems Review of Systems  All other systems reviewed and are negative.    Physical Exam Updated Vital Signs BP 113/84 (BP Location: Left Arm)    Pulse 79   Temp 98.5 F (36.9 C) (Oral)   Resp 16   Wt 81.6 kg   LMP 10/11/2016   SpO2 100%   BMI 29.05 kg/m   Physical Exam  Constitutional: She is oriented to person, place, and time. She appears well-developed and well-nourished.  HENT:  Head: Normocephalic and atraumatic.  Eyes: Conjunctivae are normal. Pupils are equal, round, and reactive to light. Right eye exhibits no discharge. Left eye exhibits no discharge. No scleral icterus.  Neck: Normal range of motion. No JVD present. No tracheal deviation present.  Cardiovascular: Normal rate, regular rhythm, normal heart sounds and intact distal pulses.   No murmur heard. Pulmonary/Chest: Effort normal and breath sounds normal. No stridor. No respiratory distress. She has no wheezes. She has  no rales. She exhibits no tenderness.  Abdominal: Soft. She exhibits no distension.  Musculoskeletal: Normal range of motion. She exhibits no edema.  Neurological: She is alert and oriented to person, place, and time. Coordination normal.  Psychiatric: She has a normal mood and affect. Her behavior is normal. Judgment and thought content normal.  Nursing note and vitals reviewed.   ED Treatments / Results  Labs (all labs ordered are listed, but only abnormal results are displayed) Labs Reviewed  CBC WITH DIFFERENTIAL/PLATELET - Abnormal; Notable for the following:       Result Value   RBC 3.83 (*)    Hemoglobin 11.0 (*)    HCT 31.3 (*)    All other components within normal limits  BASIC METABOLIC PANEL - Abnormal; Notable for the following:    Glucose, Bld 100 (*)    Calcium 8.8 (*)    All other components within normal limits  TROPONIN I    EKG  EKG Interpretation  Date/Time:  Sunday November 06 2016 12:37:30 EST Ventricular Rate:  85 PR Interval:  144 QRS Duration: 92 QT Interval:  354 QTC Calculation: 421 R Axis:   66 Text Interpretation:  Normal sinus rhythm Incomplete right bundle branch block Borderline ECG Confirmed  by DELO  MD, DOUGLAS ([id number]) on 11/06/2016 2:49:27 PM       Radiology Dg Chest 2 View  Result Date: 11/06/2016 CLINICAL DATA:  Chest pain.  Shortness of breath. EXAM: CHEST  2 VIEW COMPARISON:  October 07, 2016 FINDINGS: The heart size and mediastinal contours are within normal limits. Both lungs are clear. The visualized skeletal structures are unremarkable. IMPRESSION: No active cardiopulmonary disease. Electronically Signed   By: Dorise Bullion III M.D   On: 11/06/2016 13:33    Procedures Procedures (including critical care time)  Medications Ordered in ED Medications - No data to display   Initial Impression / Assessment and Plan / ED Course  I have reviewed the triage vital signs and the nursing notes.  Pertinent labs & imaging results that were available during my care of the patient were reviewed by me and considered in my medical decision making (see chart for details).  Clinical Course     Final Clinical Impressions(s) / ED Diagnoses   Final diagnoses:  Indigestion  Atypical chest pain   Labs: Troponin, CBC, BMP  Imaging: DG chest   Consults:  Therapeutics:  Discharge Meds:   Assessment/Plan:  45 year old female presents today with complaints of indigestion and chest pain. Patient reports a history of tachycardia currently taking beta blocker. She reports she was having symptoms that lead to her indigestion or tachycardia last night. She reports fatigue today, denies any palpitations or tachycardia this morning. Patient's laboratory analysis here very reassuring, her vital signs are very reassuring. Throughout her stay patient reports symptoms are becoming more and more typical of her indigestion with sour taste in her mouth. Patient has had cardiac workup in the past without significant findings. She appears to be very comfortable, she'll be discharged home as she has cardiology follow-up tomorrow. She is instructed to use proton pump inhibitors , return to  the emergency room immediately if any new or worsening signs or symptoms present. She verbalized understanding and agreement to today's plan had no further questions or concerns at time of discharge.      New Prescriptions Discharge Medication List as of 11/06/2016  2:50 PM    START taking these medications   Details  omeprazole (PRILOSEC) 20 MG capsule  Take 1 capsule (20 mg total) by mouth daily., Starting Sun 11/06/2016, Print         Okey Regal, PA-C 11/06/16 1701    Veryl Speak, MD 11/07/16 [phone]

## 2016-11-06 NOTE — ED Triage Notes (Signed)
Chest pain and SOB since last night, worsens with exertion, denies N/V. Describes pain as pressure

## 2016-11-06 NOTE — Discharge Instructions (Signed)
Please read attached information. If you experience any new or worsening signs or symptoms please return to the emergency room for evaluation. Please follow-up with your primary care provider or specialist as discussed. Please use medication prescribed only as directed and discontinue taking if you have any concerning signs or symptoms.   °

## 2016-11-14 ENCOUNTER — Encounter (HOSPITAL_BASED_OUTPATIENT_CLINIC_OR_DEPARTMENT_OTHER): Payer: Self-pay | Admitting: *Deleted

## 2016-11-14 ENCOUNTER — Emergency Department (HOSPITAL_BASED_OUTPATIENT_CLINIC_OR_DEPARTMENT_OTHER)
Admission: EM | Admit: 2016-11-14 | Discharge: 2016-11-14 | Disposition: A | Discharge status: Home / Self Care | Payer: BLUE CROSS/BLUE SHIELD | Attending: Emergency Medicine | Admitting: Emergency Medicine

## 2016-11-14 ENCOUNTER — Emergency Department (HOSPITAL_BASED_OUTPATIENT_CLINIC_OR_DEPARTMENT_OTHER): Payer: BLUE CROSS/BLUE SHIELD

## 2016-11-14 DIAGNOSIS — J111 Influenza due to unidentified influenza virus with other respiratory manifestations: Secondary | ICD-10-CM | POA: Diagnosis not present

## 2016-11-14 DIAGNOSIS — J029 Acute pharyngitis, unspecified: Secondary | ICD-10-CM | POA: Diagnosis present

## 2016-11-14 DIAGNOSIS — I10 Essential (primary) hypertension: Secondary | ICD-10-CM | POA: Insufficient documentation

## 2016-11-14 DIAGNOSIS — R69 Illness, unspecified: Secondary | ICD-10-CM

## 2016-11-14 LAB — BASIC METABOLIC PANEL
Anion gap: 7 (ref 5–15)
BUN: 8 mg/dL (ref 6–20)
CO2: 26 mmol/L (ref 22–32)
Calcium: 8.7 mg/dL — ABNORMAL LOW (ref 8.9–10.3)
Chloride: 105 mmol/L (ref 101–111)
Creatinine, Ser: 0.7 mg/dL (ref 0.44–1.00)
GFR calc Af Amer: >60 mL/min (ref >60)
GFR calc non Af Amer: >60 mL/min (ref >60)
Glucose, Bld: 90 mg/dL (ref 65–99)
Potassium: 3.3 mmol/L — ABNORMAL LOW (ref 3.5–5.1)
Sodium: 138 mmol/L (ref 135–145)

## 2016-11-14 LAB — URINALYSIS, ROUTINE W REFLEX MICROSCOPIC
Bilirubin Urine: NEGATIVE
Glucose, UA: NEGATIVE mg/dL
Hgb urine dipstick: NEGATIVE
Ketones, ur: NEGATIVE mg/dL
Leukocytes, UA: NEGATIVE
Nitrite: NEGATIVE
Protein, ur: NEGATIVE mg/dL
Specific Gravity, Urine: 1.023 (ref 1.005–1.030)
pH: 8 (ref 5.0–8.0)

## 2016-11-14 LAB — CBC WITH DIFFERENTIAL/PLATELET
Basophils Absolute: 0 10*3/uL (ref 0.0–0.1)
Basophils Relative: 0 %
Eosinophils Absolute: 0 10*3/uL (ref 0.0–0.7)
Eosinophils Relative: 0 %
HCT: 32.2 % — ABNORMAL LOW (ref 36.0–46.0)
Hemoglobin: 11.4 g/dL — ABNORMAL LOW (ref 12.0–15.0)
Lymphocytes Relative: 10 %
Lymphs Abs: 0.8 10*3/uL (ref 0.7–4.0)
MCH: 29 pg (ref 26.0–34.0)
MCHC: 35.4 g/dL (ref 30.0–36.0)
MCV: 81.9 fL (ref 78.0–100.0)
Monocytes Absolute: 0.6 10*3/uL (ref 0.1–1.0)
Monocytes Relative: 8 %
Neutro Abs: 6.6 10*3/uL (ref 1.7–7.7)
Neutrophils Relative %: 82 %
Platelets: 325 10*3/uL (ref 150–400)
RBC: 3.93 MIL/uL (ref 3.87–5.11)
RDW: 13.3 % (ref 11.5–15.5)
WBC: 8.1 10*3/uL (ref 4.0–10.5)

## 2016-11-14 LAB — RAPID STREP SCREEN (MED CTR MEBANE ONLY): Streptococcus, Group A Screen (Direct): NEGATIVE

## 2016-11-14 MED ORDER — IBUPROFEN 800 MG PO TABS: 800.0000 mg | ORAL_TABLET | Freq: Three times a day (TID) | ORAL | 0 refills | Status: DC | PRN

## 2016-11-14 MED ORDER — GUAIFENESIN ER 1200 MG PO TB12: 1.0000 | ORAL_TABLET | Freq: Two times a day (BID) | ORAL | 0 refills | Status: DC

## 2016-11-14 MED ORDER — ONDANSETRON HCL 4 MG/2ML IJ SOLN
4.0000 mg | Freq: Once | INTRAMUSCULAR | Status: AC
  Administered 2016-11-14: 4 mg via INTRAVENOUS
  Filled 2016-11-14: qty 2

## 2016-11-14 MED ORDER — ACETAMINOPHEN 325 MG PO TABS
650.0000 mg | ORAL_TABLET | Freq: Once | ORAL | Status: AC
  Administered 2016-11-14: 650 mg via ORAL
  Filled 2016-11-14: qty 2

## 2016-11-14 MED ORDER — ACETAMINOPHEN-CODEINE 120-12 MG/5ML PO SOLN: 10.0000 mL | ORAL | 0 refills | Status: DC | PRN

## 2016-11-14 MED ORDER — PROMETHAZINE-DM 6.25-15 MG/5ML PO SYRP: 5.0000 mL | ORAL_SOLUTION | Freq: Four times a day (QID) | ORAL | 0 refills | Status: DC | PRN

## 2016-11-14 MED ORDER — SODIUM CHLORIDE 0.9 % IV BOLUS (SEPSIS)
1000.0000 mL | Freq: Once | INTRAVENOUS | Status: AC
  Administered 2016-11-14: 1000 mL via INTRAVENOUS

## 2016-11-14 NOTE — Discharge Instructions (Signed)
Return here as needed.  Follow-up with your primary care doctor, increase her fluid intake and rest as much as possible

## 2016-11-14 NOTE — ED Triage Notes (Signed)
Pt reports sore throat, ear pain, and body aches with fevers up to 104 x saturday

## 2016-11-16 LAB — CULTURE, GROUP A STREP (THRC)

## 2016-11-17 ENCOUNTER — Ambulatory Visit (INDEPENDENT_AMBULATORY_CARE_PROVIDER_SITE_OTHER): Payer: BLUE CROSS/BLUE SHIELD | Admitting: Cardiology

## 2016-11-17 ENCOUNTER — Encounter: Payer: Self-pay | Admitting: Cardiology

## 2016-11-17 DIAGNOSIS — IMO0001 Reserved for inherently not codable concepts without codable children: Secondary | ICD-10-CM | POA: Insufficient documentation

## 2016-11-17 DIAGNOSIS — R002 Palpitations: Secondary | ICD-10-CM | POA: Diagnosis not present

## 2016-11-17 DIAGNOSIS — Z0389 Encounter for observation for other suspected diseases and conditions ruled out: Secondary | ICD-10-CM

## 2016-11-17 NOTE — Assessment & Plan Note (Signed)
Seen in the ED 11/06/16 with increased palpitations in the setting of sinusitis.

## 2016-11-17 NOTE — Assessment & Plan Note (Signed)
Normal coronaries and normal LVF Feb 2017

## 2016-11-17 NOTE — Patient Instructions (Signed)
Medication Instructions:  Your physician recommends that you continue on your current medications as directed. Please refer to the Current Medication list given to you today.  YOU MAY TAKE PROPRANOLOL TWICE DAILY AS NEEDED FOR INCREASED PALPITATIONS.  Labwork: NONE  Testing/Procedures: NONE  Follow-Up: AS NEEDED WITH DR. [place].    If you need a refill on your cardiac medications before your next appointment, please call your pharmacy.

## 2016-11-17 NOTE — Progress Notes (Signed)
11/17/2016 Stacy [name]   [birth year]  [id number]  Primary Physician Benito Mccreedy, MD Primary Cardiologist: Dr Oval Linsey  HPI:  45 y/o AA female with a long history of chest pain and palpitations. She has had extensive cardiac work including echo, stress test, chest CT, and she had a diagnostic cath feb 2017 that showed normal coronaries and normal LVF. She was seen in the ED with "extra beats" associated with chest discomfort 11/06/16. PPI was added and she was instructed to follow up with her cardiologist. She denies any sustained tachycardia. She was diagnosed with sinusitis and this has been treated, she is not on OTC decongestants.    Current Outpatient Prescriptions  Medication Sig Dispense Refill  . clonazePAM (KLONOPIN) 0.5 MG tablet Take 0.5 mg by mouth daily as needed for anxiety.    . hydrochlorothiazide (MICROZIDE) 12.5 MG capsule Take 1 capsule (12.5 mg total) by mouth every other day. 90 capsule 3  . omeprazole (PRILOSEC) 20 MG capsule Take 1 capsule (20 mg total) by mouth daily. 30 capsule 0  . propranolol (INDERAL) 20 MG tablet Take 10 mg by mouth 2 (two) times daily.    . ranitidine (ZANTAC) 150 MG tablet Take 150 mg by mouth at bedtime as needed for heartburn.     No current facility-administered medications for this visit.    Facility-Administered Medications Ordered in Other Visits  Medication Dose Route Frequency Provider Last Rate Last Dose  . iohexol (OMNIPAQUE) 300 MG/ML solution 100 mL  100 mL Intravenous Once PRN Jola Schmidt, MD        Allergies  Allergen Reactions  . Hydromorphone Shortness Of Breath and Nausea And Vomiting  . Morphine Nausea And Vomiting  . Diphenhydramine Hives    Social History   Social History  . Marital status: Legally Separated    Spouse name: N/A  . Number of children: 2  . Years of education: N/A   Occupational History  .  [employer]   Social History Main Topics  . Smoking status: Never Smoker  .  Smokeless tobacco: Never Used  . Alcohol use No  . Drug use: No  . Sexual activity: No   Other Topics Concern  . Not on file   Social History Narrative   Epworth Sleepiness Scale = 5 (as of 09/16/2015)     Review of Systems: General: negative for chills, fever, night sweats or weight changes.  Cardiovascular: negative for chest pain, dyspnea on exertion, edema, orthopnea, palpitations, paroxysmal nocturnal dyspnea or shortness of breath Dermatological: negative for rash Respiratory: negative for cough or wheezing Urologic: negative for hematuria Abdominal: negative for nausea, vomiting, diarrhea, bright red blood per rectum, melena, or hematemesis Neurologic: negative for visual changes, syncope, or dizziness All other systems reviewed and are otherwise negative except as noted above.    Blood pressure 118/82, pulse 77, height 5\' 6"  (1.676 m), weight 183 lb 6.4 oz (83.2 kg), last menstrual period 10/07/2016, SpO2 100 %.  General appearance: alert, cooperative and no distress Lungs: clear to auscultation bilaterally Heart: regular rate and rhythm Extremities: no edema Neurologic: Grossly normal  EKG NSR, incomplete RBBB  ASSESSMENT AND PLAN:   PALPITATIONS, RECURRENT Seen in the ED 11/06/16 with increased palpitations in the setting of sinusitis.   Normal coronary arteries Normal coronaries and normal LVF Feb 2017   PLAN  I suggested she take an extra Propanolol when she is going through a period of frequent palpitations. She told me she didn't know that's what the  Propanol was for and she will try this. We can see her PRN.   Kerin Ransom PA-C 11/17/2016 12:05 PM

## 2016-11-18 NOTE — ED Provider Notes (Signed)
[facility] DEPT Provider Note   CSN: [id number] Arrival date & time: 11/14/16  [id number]     History   Chief Complaint Chief Complaint  Patient presents with  . Sore Throat    HPI Stacy [name] is a 45 y.o. female.  HPI Patient presents to the emergency department with sore throat, body aches with nausea over the last 2 days.  The patient states that she did not take any medications prior to arrival.  She states nothing seems make the condition better or worseThe patient denies chest pain, shortness of breath, headache,blurred vision, neck pain, cough, weakness, numbness, dizziness, anorexia, edema, abdominal pain,  vomiting, diarrhea, rash, back pain, dysuria, hematemesis, bloody stool, near syncope, or syncope. Past Medical History:  Diagnosis Date  . Abnormal Pap smear    cryo, normal since  . Anxiety    past Hx  . Arrhythmia   . Chest pain 09/14/2009   no pulmonary embolus by chest CT  . Depression    past Hx  . Elevated BP   . Esophageal stricture   . Essential hypertension 09/28/2009   Qualifier: Diagnosis of  By: [name] FNP, [place]    . Fibroid   . Fibroids 09/24/2015  . GERD (gastroesophageal reflux disease)    no meds  . Hypertension   . Palpitations   . Pneumonia   . Sickle cell trait ([place])   . Vaginal Pap smear, abnormal     Patient Active Problem List   Diagnosis Date Noted  . Normal coronary arteries 11/17/2016  . Family history of cardiovascular disease 03/21/2016  . Obstructive apnea 03/09/2016  . Abnormal uterine bleeding 02/22/2016  . Anxiety about health 02/22/2016  . Gastro-esophageal reflux disease without esophagitis 02/22/2016  . Fast heart beat 02/22/2016  . Fibroid 02/22/2016  . Elevated troponin   . Chest pain 01/01/2016  . Major depressive disorder 10/26/2015  . 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 Fibroids 09/24/2015  . Pain, joint, multiple sites 09/22/2015  . Visual disturbance 09/22/2015  . Numbness 09/22/2015   . Dyspnea 08/10/2015  . Obesity (BMI 30-39.9) 04/17/2015  . Environmental allergies 04/17/2015  . Thyromegaly 10/01/2014  . Edema 11/07/2013  . Atypical chest pain 04/30/2013  . Other fatigue 05/24/2010  . VITAMIN D DEFICIENCY 09/30/2009  . PALPITATIONS, RECURRENT 09/30/2009  . CHEST PAIN, ATYPICAL, HX OF 09/30/2009  . Anxiety state 09/28/2009  . Depression with anxiety 09/28/2009  . GERD 09/28/2009  . Essential hypertension 09/28/2009    Past Surgical History:  Procedure Laterality Date  . CARDIAC CATHETERIZATION N/A 01/04/2016   Procedure: Left Heart Cath and Coronary Angiography;  Surgeon: Belva Crome, MD;  Location: [facility];  Service: Cardiovascular;  Laterality: N/A;  . CESAREAN SECTION  2004  . LAPAROSCOPY FOR ECTOPIC PREGNANCY  2001  . WISDOM TOOTH EXTRACTION  2012    OB History    Gravida Para Term Preterm AB Living   7 2 2  0 3 2   SAB TAB Ectopic Multiple Live Births   0 0 3 0         Home Medications    Prior to Admission medications   Medication Sig Start Date End Date Taking? Authorizing Provider  clonazePAM (KLONOPIN) 0.5 MG tablet Take 0.5 mg by mouth daily as needed for anxiety.    Historical Provider, MD  hydrochlorothiazide (MICROZIDE) 12.5 MG capsule Take 1 capsule (12.5 mg total) by mouth every other day. 07/22/16   Skeet Latch, MD  omeprazole ([place]) 20  MG capsule Take 1 capsule (20 mg total) by mouth daily. 11/06/16   Okey Regal, PA-C  propranolol (INDERAL) 20 MG tablet Take 10 mg by mouth daily.    Historical Provider, MD  ranitidine (ZANTAC) 150 MG tablet Take 150 mg by mouth at bedtime as needed for heartburn.    Historical Provider, MD    Family History Family History  Problem Relation Age of Onset  . Asthma Mother   . Hypertension Mother   . Thyroid disease Mother   . Heart Problems Mother   . Hypertension Paternal Uncle   . Asthma Maternal Grandmother   . Asthma Daughter   . Colon cancer Neg Hx   . Esophageal  cancer Neg Hx   . Stomach cancer Neg Hx     Social History Social History  Substance Use Topics  . Smoking status: Never Smoker  . Smokeless tobacco: Never Used  . Alcohol use No     Allergies   Hydromorphone; Morphine; and Diphenhydramine   Review of Systems Review of Systems All other systems negative except as documented in the HPI. All pertinent positives and negatives as reviewed in the HPI.  Physical Exam Updated Vital Signs BP 106/64 (BP Location: Right Arm)   Pulse 90   Temp 98.6 F (37 C) (Oral)   Resp 16   Ht 5\' 6"  (1.676 m)   Wt 83 kg   LMP 10/07/2016   SpO2 99%   BMI 29.54 kg/m   Physical Exam  Constitutional: She is oriented to person, place, and time. She appears well-developed and well-nourished. No distress.  HENT:  Head: Normocephalic and atraumatic.  Mouth/Throat: Oropharynx is clear and moist.  Eyes: Pupils are equal, round, and reactive to light.  Neck: Normal range of motion. Neck supple.  Cardiovascular: Normal rate, regular rhythm and normal heart sounds.  Exam reveals no gallop and no friction rub.   No murmur heard. Pulmonary/Chest: Effort normal and breath sounds normal. No respiratory distress. She has no wheezes.  Abdominal: Soft. Bowel sounds are normal. She exhibits no distension. There is no tenderness.  Neurological: She is alert and oriented to person, place, and time. She exhibits normal muscle tone. Coordination normal.  Skin: Skin is warm and dry. No rash noted. No erythema.  Psychiatric: She has a normal mood and affect. Her behavior is normal.  Nursing note and vitals reviewed.    ED Treatments / Results  Labs (all labs ordered are listed, but only abnormal results are displayed) Labs Reviewed  BASIC METABOLIC PANEL - Abnormal; Notable for the following:       Result Value   Potassium 3.3 (*)    Calcium 8.7 (*)    All other components within normal limits  CBC WITH DIFFERENTIAL/PLATELET - Abnormal; Notable for the  following:    Hemoglobin 11.4 (*)    HCT 32.2 (*)    All other components within normal limits  RAPID STREP SCREEN (NOT AT [facility])  CULTURE, GROUP A STREP ([place])  URINALYSIS, ROUTINE W REFLEX MICROSCOPIC    EKG  EKG Interpretation None       Radiology No results found.  Procedures Procedures (including critical care time)  Medications Ordered in ED Medications  acetaminophen (TYLENOL) tablet 650 mg (650 mg Oral Given 11/14/16 1013)  ondansetron (ZOFRAN) injection 4 mg (4 mg Intravenous Given 11/14/16 1029)  sodium chloride 0.9 % bolus 1,000 mL (0 mLs Intravenous Stopped 11/14/16 1130)     Initial Impression / Assessment and Plan / ED  Course  I have reviewed the triage vital signs and the nursing notes.  Pertinent labs & imaging results that were available during my care of the patient were reviewed by me and considered in my medical decision making (see chart for details).  Clinical Course     The patient most likely has an influenza-like illness, based on her history of present illness and physical exam findings.  Patient is given IV fluids and antiemetics.  Patient is advised of the plan plan and all questions were answered.   Final Clinical Impressions(s) / ED Diagnoses   Final diagnoses:  Influenza-like illness    New Prescriptions Discharge Medication List as of 11/14/2016  1:15 PM    START taking these medications   Details  acetaminophen-codeine 120-12 MG/5ML solution Take 10 mLs by mouth every 4 (four) hours as needed for moderate pain., Starting Mon 11/14/2016, Print    Guaifenesin 1200 MG TB12 Take 1 tablet (1,200 mg total) by mouth 2 (two) times daily., Starting Mon 11/14/2016, Print    ibuprofen (ADVIL,MOTRIN) 800 MG tablet Take 1 tablet (800 mg total) by mouth every 8 (eight) hours as needed., Starting Mon 11/14/2016, Print         Dalia Heading, PA-C 11/18/16 [place], MD 11/20/16 1116

## 2016-12-20 ENCOUNTER — Encounter (HOSPITAL_BASED_OUTPATIENT_CLINIC_OR_DEPARTMENT_OTHER): Payer: Self-pay | Admitting: Emergency Medicine

## 2016-12-20 ENCOUNTER — Emergency Department (HOSPITAL_BASED_OUTPATIENT_CLINIC_OR_DEPARTMENT_OTHER)
Admission: EM | Admit: 2016-12-20 | Discharge: 2016-12-20 | Disposition: A | Discharge status: Home / Self Care | Payer: BLUE CROSS/BLUE SHIELD | Attending: Emergency Medicine | Admitting: Emergency Medicine

## 2016-12-20 ENCOUNTER — Emergency Department (HOSPITAL_BASED_OUTPATIENT_CLINIC_OR_DEPARTMENT_OTHER): Payer: BLUE CROSS/BLUE SHIELD

## 2016-12-20 DIAGNOSIS — R5383 Other fatigue: Secondary | ICD-10-CM | POA: Insufficient documentation

## 2016-12-20 DIAGNOSIS — Z79899 Other long term (current) drug therapy: Secondary | ICD-10-CM | POA: Diagnosis not present

## 2016-12-20 DIAGNOSIS — I10 Essential (primary) hypertension: Secondary | ICD-10-CM | POA: Insufficient documentation

## 2016-12-20 DIAGNOSIS — R55 Syncope and collapse: Secondary | ICD-10-CM

## 2016-12-20 DIAGNOSIS — R42 Dizziness and giddiness: Secondary | ICD-10-CM | POA: Diagnosis present

## 2016-12-20 LAB — CBC
HCT: 33.2 % — ABNORMAL LOW (ref 36.0–46.0)
Hemoglobin: 11.6 g/dL — ABNORMAL LOW (ref 12.0–15.0)
MCH: 28.7 pg (ref 26.0–34.0)
MCHC: 34.9 g/dL (ref 30.0–36.0)
MCV: 82.2 fL (ref 78.0–100.0)
Platelets: 339 10*3/uL (ref 150–400)
RBC: 4.04 MIL/uL (ref 3.87–5.11)
RDW: 12.9 % (ref 11.5–15.5)
WBC: 5.2 10*3/uL (ref 4.0–10.5)

## 2016-12-20 LAB — CBG MONITORING, ED: Glucose-Capillary: 79 mg/dL (ref 65–99)

## 2016-12-20 LAB — TROPONIN I

## 2016-12-20 LAB — BASIC METABOLIC PANEL
Anion gap: 6 (ref 5–15)
BUN: 10 mg/dL (ref 6–20)
CHLORIDE: 103 mmol/L (ref 101–111)
CO2: 29 mmol/L (ref 22–32)
Calcium: 8.8 mg/dL — ABNORMAL LOW (ref 8.9–10.3)
Creatinine, Ser: 0.57 mg/dL (ref 0.44–1.00)
GFR calc Af Amer: >60 mL/min (ref >60)
GFR calc non Af Amer: >60 mL/min (ref >60)
GLUCOSE: 102 mg/dL — AB (ref 65–99)
Potassium: 3.3 mmol/L — ABNORMAL LOW (ref 3.5–5.1)
Sodium: 138 mmol/L (ref 135–145)

## 2016-12-20 LAB — URINALYSIS, ROUTINE W REFLEX MICROSCOPIC
BILIRUBIN URINE: NEGATIVE
GLUCOSE, UA: NEGATIVE mg/dL
HGB URINE DIPSTICK: NEGATIVE
KETONES UR: NEGATIVE mg/dL
Leukocytes, UA: NEGATIVE
Nitrite: NEGATIVE
PH: 7.5 (ref 5.0–8.0)
Protein, ur: NEGATIVE mg/dL
Specific Gravity, Urine: 1.019 (ref 1.005–1.030)

## 2016-12-20 LAB — MAGNESIUM: Magnesium: 1.8 mg/dL (ref 1.7–2.4)

## 2016-12-20 LAB — D-DIMER, QUANTITATIVE: D-Dimer, Quant: 0.8 ug/mL-FEU — ABNORMAL HIGH (ref 0.00–0.50)

## 2016-12-20 LAB — PREGNANCY, URINE: PREG TEST UR: NEGATIVE

## 2016-12-20 MED ORDER — SODIUM CHLORIDE 0.9 % IV BOLUS (SEPSIS)
1000.0000 mL | Freq: Once | INTRAVENOUS | Status: AC
  Administered 2016-12-20: 1000 mL via INTRAVENOUS

## 2016-12-20 MED ORDER — ONDANSETRON HCL 4 MG PO TABS: 4.0000 mg | ORAL_TABLET | Freq: Three times a day (TID) | ORAL | 0 refills | Status: DC | PRN

## 2016-12-20 NOTE — Discharge Instructions (Signed)
Please take your nausea medicine if needed to stay hydrated. He will likely dehydrated causing your lightheadedness spell today. If symptoms return or worsen, please return to the nearest emergency department. Please follow-up with your primary care physician for further management of your blood pressure medicines.

## 2016-12-20 NOTE — ED Notes (Signed)
Pt reports she takes propanolol daily and took her dose today at 0600. While working today she felt suddenly "weak". Describes it as generalized weakness. States she had the flu 3 weeks ago and has not felt well since then. Denies LOC

## 2016-12-20 NOTE — ED Triage Notes (Signed)
Patient reports that she was at work and felt really weak all over and almost passed out. The patient reports that she has never had a weakness like this. The patient reports that he co worker check her HR and it was high. Patient is having left arm and chest pain

## 2016-12-20 NOTE — ED Provider Notes (Signed)
[facility] Provider Note   CSN: [id number] Arrival date & time: 12/20/16  1422     History   Chief Complaint Chief Complaint  Patient presents with  . Near Syncope    HPI Stacy [name] is a 46 y.o. female with a past medical history significant for hypertension, GERD, prior palpitations, and recent diagnosis of influenza-like illness who presents with lightheadedness and a near syncopal event today. Patient says that last week, she had the flu. She says that she began to feel better but continued to have intermittent episodes of diarrhea and nausea and vomiting. She denies either of those symptoms today. She says that she returned to work and while at work today, had an episode of near-syncope. She reports that she was just sitting there doing her work when she got very lightheaded. She said that she slumped to the side but did not lose consciousness. She denied any chest pain or shortness of breath with the spell however, she does report that she felt some palpitations. She said that a person at her work checked her pulse and it was 112. She reports that she takes propranolol for her high blood pressure and she took this morning's exam. Next  Patient says that she may have been eating and drinking less given the intermittent episodes of nausea and vomiting. She says that her diarrhea has resolved but she had multiple episodes of days ago. She denies any dysuria or change in urination. She denies any congestion or cough. She denies any shortness of breath or chest pain today but says that she was short of breath several days ago propping her to seek care in urgent care where she was told she was likely still having symptoms from her URI.  HPI  Past Medical History:  Diagnosis Date  . Abnormal Pap smear    cryo, normal since  . Anxiety    past Hx  . Arrhythmia   . Chest pain 09/14/2009   no pulmonary embolus by chest CT  . Depression    past Hx  . Elevated BP     . Esophageal stricture   . Essential hypertension 09/28/2009   Qualifier: Diagnosis of  By: [name] FNP, [place]    . Fibroid   . Fibroids 09/24/2015  . GERD (gastroesophageal reflux disease)    no meds  . Hypertension   . Palpitations   . Pneumonia   . Sickle cell trait ([place])   . Vaginal Pap smear, abnormal     Patient Active Problem List   Diagnosis Date Noted  . Normal coronary arteries 11/17/2016  . Family history of cardiovascular disease 03/21/2016  . Obstructive apnea 03/09/2016  . Abnormal uterine bleeding 02/22/2016  . Anxiety about health 02/22/2016  . Gastro-esophageal reflux disease without esophagitis 02/22/2016  . Fast heart beat 02/22/2016  . Fibroid 02/22/2016  . Elevated troponin   . Chest pain 01/01/2016  . Major depressive disorder 10/26/2015  . 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 Fibroids 09/24/2015  . Pain, joint, multiple sites 09/22/2015  . Visual disturbance 09/22/2015  . Numbness 09/22/2015  . Dyspnea 08/10/2015  . Obesity (BMI 30-39.9) 04/17/2015  . Environmental allergies 04/17/2015  . Thyromegaly 10/01/2014  . Edema 11/07/2013  . Atypical chest pain 04/30/2013  . Other fatigue 05/24/2010  . VITAMIN D DEFICIENCY 09/30/2009  . PALPITATIONS, RECURRENT 09/30/2009  . CHEST PAIN, ATYPICAL, HX OF 09/30/2009  . Anxiety state 09/28/2009  . Depression with anxiety 09/28/2009  . GERD 09/28/2009  .  Essential hypertension 09/28/2009    Past Surgical History:  Procedure Laterality Date  . CARDIAC CATHETERIZATION N/A 01/04/2016   Procedure: Left Heart Cath and Coronary Angiography;  Surgeon: Belva Crome, MD;  Location: [facility];  Service: Cardiovascular;  Laterality: N/A;  . CESAREAN SECTION  2004  . LAPAROSCOPY FOR ECTOPIC PREGNANCY  2001  . WISDOM TOOTH EXTRACTION  2012    OB History    Gravida Para Term Preterm AB Living   7 2 2  0 3 2   SAB TAB Ectopic Multiple Live Births   0 0 3 0         Home Medications    Prior  to Admission medications   Medication Sig Start Date End Date Taking? Authorizing Provider  clonazePAM (KLONOPIN) 0.5 MG tablet Take 0.5 mg by mouth daily as needed for anxiety.    Historical Provider, MD  hydrochlorothiazide (MICROZIDE) 12.5 MG capsule Take 1 capsule (12.5 mg total) by mouth every other day. 07/22/16   Skeet Latch, MD  omeprazole (PRILOSEC) 20 MG capsule Take 1 capsule (20 mg total) by mouth daily. 11/06/16   Okey Regal, PA-C  propranolol (INDERAL) 20 MG tablet Take 10 mg by mouth daily.    Historical Provider, MD  ranitidine (ZANTAC) 150 MG tablet Take 150 mg by mouth at bedtime as needed for heartburn.    Historical Provider, MD    Family History Family History  Problem Relation Age of Onset  . Asthma Mother   . Hypertension Mother   . Thyroid disease Mother   . Heart Problems Mother   . Hypertension Paternal Uncle   . Asthma Maternal Grandmother   . Asthma Daughter   . Colon cancer Neg Hx   . Esophageal cancer Neg Hx   . Stomach cancer Neg Hx     Social History Social History  Substance Use Topics  . Smoking status: Never Smoker  . Smokeless tobacco: Never Used  . Alcohol use No     Allergies   Hydromorphone; Morphine; and Diphenhydramine   Review of Systems Review of Systems  Constitutional: Positive for fatigue. Negative for activity change, chills, diaphoresis and fever.  HENT: Negative for congestion and rhinorrhea.   Eyes: Negative for visual disturbance.  Respiratory: Negative for cough, chest tightness, shortness of breath and stridor.   Cardiovascular: Negative for chest pain, palpitations and leg swelling.  Gastrointestinal: Negative for abdominal distention, abdominal pain, constipation, diarrhea (recently), nausea and vomiting (recently).  Genitourinary: Negative for difficulty urinating, dysuria, flank pain, frequency, hematuria, menstrual problem, pelvic pain, vaginal bleeding and vaginal discharge.  Musculoskeletal: Negative for  back pain and neck pain.  Skin: Negative for rash and wound.  Neurological: Positive for light-headedness. Negative for dizziness, weakness, numbness and headaches.  Psychiatric/Behavioral: Negative for agitation and confusion.  All other systems reviewed and are negative.    Physical Exam Updated Vital Signs BP 105/75 (BP Location: Right Arm)   Pulse 82   Temp 98.6 F (37 C) (Oral)   Resp 18   Ht 5\' 6"  (1.676 m)   Wt 180 lb (81.6 kg)   SpO2 100%   BMI 29.05 kg/m   Physical Exam  Constitutional: She appears well-developed and well-nourished. No distress.  HENT:  Head: Normocephalic and atraumatic.  Nose: Nose normal.  Mouth/Throat: No oropharyngeal exudate.  Eyes: Conjunctivae are normal. Pupils are equal, round, and reactive to light.  Neck: Normal range of motion. Neck supple.  Cardiovascular: Normal rate, regular rhythm and intact distal  pulses.   No murmur heard. Pulmonary/Chest: Effort normal and breath sounds normal. No stridor. No respiratory distress.  Abdominal: Soft. There is no tenderness. There is no guarding.  Musculoskeletal: She exhibits no edema or tenderness.  Neurological: She is alert. No sensory deficit. She exhibits normal muscle tone.  Skin: Skin is warm and dry. Capillary refill takes less than 2 seconds. No rash noted.  Psychiatric: She has a normal mood and affect.  Nursing note and vitals reviewed.    ED Treatments / Results  Labs (all labs ordered are listed, but only abnormal results are displayed) Labs Reviewed  BASIC METABOLIC PANEL - Abnormal; Notable for the following:       Result Value   Potassium 3.3 (*)    Glucose, Bld 102 (*)    Calcium 8.8 (*)    All other components within normal limits  CBC - Abnormal; Notable for the following:    Hemoglobin 11.6 (*)    HCT 33.2 (*)    All other components within normal limits  D-DIMER, QUANTITATIVE (NOT AT [facility]) - Abnormal; Notable for the following:    D-Dimer, Quant 0.80 (*)    All  other components within normal limits  URINALYSIS, ROUTINE W REFLEX MICROSCOPIC  MAGNESIUM  PREGNANCY, URINE  TROPONIN I  CBG MONITORING, ED    EKG  EKG Interpretation  Date/Time:  Tuesday December 20 2016 14:35:48 EST Ventricular Rate:  72 PR Interval:    QRS Duration: 106 QT Interval:  381 QTC Calculation: 417 R Axis:   37 Text Interpretation:  Sinus rhythm Minimal ST depression, anterolateral leads Baseline wander in lead(s) I II III aVR aVF V1 V3 V4 When compared to prior, no significant changes were seen., No STEMI Confirmed by Sherry Ruffing MD, CHRISTOPHER [phone]) on 12/21/2016 11:09:12 AM       Radiology Dg Chest 2 View  Result Date: 12/20/2016 CLINICAL DATA:  Acute onset of lightheadedness and near-syncope. Initial encounter. EXAM: CHEST  2 VIEW COMPARISON:  Chest radiograph performed 11/14/2016 FINDINGS: The lungs are well-aerated and clear. There is no evidence of focal opacification, pleural effusion or pneumothorax. The heart is normal in size; the mediastinal contour is within normal limits. No acute osseous abnormalities are seen. IMPRESSION: No acute cardiopulmonary process seen. Electronically Signed   By: Garald Balding M.D.   On: 12/20/2016 16:42    Procedures Procedures (including critical care time)  Medications Ordered in ED Medications  sodium chloride 0.9 % bolus 1,000 mL (0 mLs Intravenous Stopped 12/20/16 1800)  sodium chloride 0.9 % bolus 1,000 mL (0 mLs Intravenous Stopped 12/20/16 1804)     Initial Impression / Assessment and Plan / ED Course  I have reviewed the triage vital signs and the nursing notes.  Pertinent labs & imaging results that were available during my care of the patient were reviewed by me and considered in my medical decision making (see chart for details).     Quillie Muratori is a 46 y.o. female with a past medical history significant for hypertension, GERD, prior palpitations, and recent diagnosis of influenza-like illness who  presents with lightheadedness and a near syncopal event today.   history and exam are seen above. Next  On my initial examination, patient had a blood pressure of 99 systolic. Patient said that she was feeling better in regards to her lightheadedness and her syncopal event. She agrees that she might be feeling dehydrated in the setting of her recent infection. Patient agreed to workup to look for  occult infection, dehydration, R abnormality, or any cardiac etiologies of her near syncopy.  Diagnostic workup results are seen above. Troponin negative, pregnancy negative, Metabolic panel showed slight decrease in potassium, patient advised on oral supplementation and foods to eat, urinalysis showed no infection, No leukocytosis on CBC. Hemoglobin slightly decreased.  Patient was held elevated the d dimer, however, patient says that it is "always elevated". She says that given her history of negative CT scans and lack of chest pain or shortness of breath at this time, she does not want a piece study performed. She says that the she feels comfortable with return precautions if she developed her symptoms as well as the follow-up for further workup of her elevated D dimer.  No evidence of pneumonia on x-ray. EKG appeared similar to prior.  Patient reported feeling much better after fluids. Suspect dehydration in the setting of her recent  Flu, nausea, and diarrhea.  Patient reports that he feels better and wants to go home. Patient will follow up with her PCP for further management of symptoms and understood return precautions were any new or worsening symptoms. Patient all appropriate for discharge and was able to emulate to the bathroom without difficulty. No lightheadedness or near syncope. Patient Given prescription for nausea medication instructed to stay hydrated. Patient understood return precautions and patient was discharged in good condition.     Final Clinical Impressions(s) / ED Diagnoses    Final diagnoses:  Near syncope  Lightheadedness    New Prescriptions Discharge Medication List as of 12/20/2016  7:18 PM    START taking these medications   Details  ondansetron (ZOFRAN) 4 MG tablet Take 1 tablet (4 mg total) by mouth every 8 (eight) hours as needed for nausea or vomiting., Starting Tue 12/20/2016, Print        Clinical Impression: 1. Near syncope   2. Lightheadedness     Disposition: Discharge  Condition: Good  I have discussed the results, Dx and Tx plan with the pt(& family if present). He/she/they expressed understanding and agree(s) with the plan. Discharge instructions discussed at great length. Strict return precautions discussed and pt &/or family have verbalized understanding of the instructions. No further questions at time of discharge.    Discharge Medication List as of 12/20/2016  7:18 PM    START taking these medications   Details  ondansetron (ZOFRAN) 4 MG tablet Take 1 tablet (4 mg total) by mouth every 8 (eight) hours as needed for nausea or vomiting., Starting Tue 12/20/2016, Print        Follow Up: [facility] [address] [id number] [place] [id number]  If symptoms worsen     Courtney Paris, MD 12/21/16 [phone]

## 2016-12-25 ENCOUNTER — Other Ambulatory Visit: Payer: Self-pay | Admitting: Cardiovascular Disease

## 2016-12-26 NOTE — Telephone Encounter (Signed)
Refill Request.  

## 2016-12-26 NOTE — Telephone Encounter (Signed)
Refill request

## 2017-01-30 IMAGING — CT CT CHEST W/ CM
2 of 5 series · 15 of 46 positions shown, 17 images · IV contrast (OMNIPAQUE 300)
Comparison: Multiple exams, including 11/25/2015 and 02/04/2016

CLINICAL DATA: Per EMS, c/o central chest pain, abdominal pain,
fever, and nausea after having an endoscopy this morning. Pain score
[DATE]. Hx of GERD. Pt took ODT Zofran prior to EMS arrival.

EXAM:
CT CHEST, ABDOMEN, AND PELVIS WITH CONTRAST
TECHNIQUE: Multidetector CT imaging of the chest, abdomen and pelvis was
performed following the standard protocol during bolus
administration of intravenous contrast.
CONTRAST:  100mL OMNIPAQUE IOHEXOL 300 MG/ML  SOLN

[Series 2: abd/pel with · axial · 0.78mm/px · z∈[-153,+382]mm · 12 of 121 slices shown, 14 images]
[im 7/121  soft-tissue]
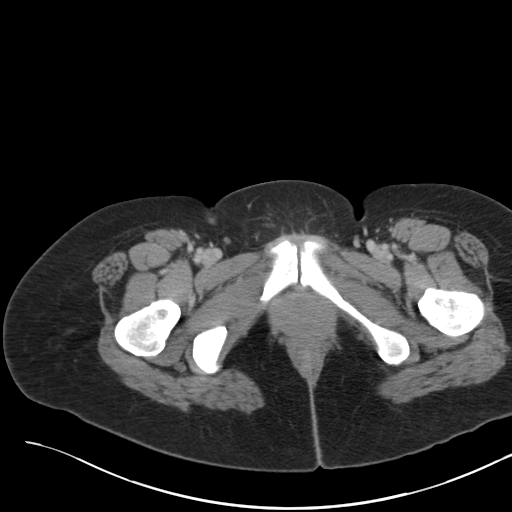
[im 7/121  bone]
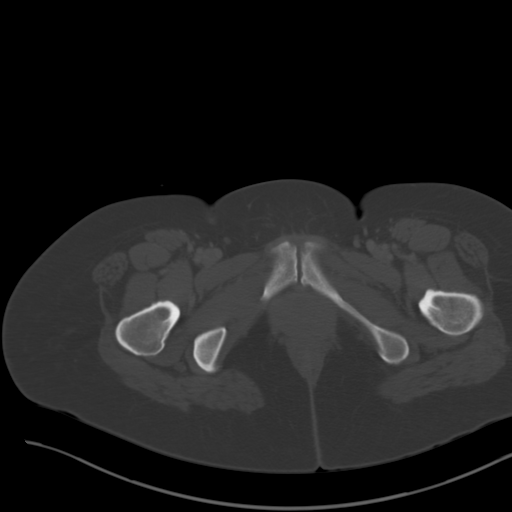
[im 21/121  soft-tissue]
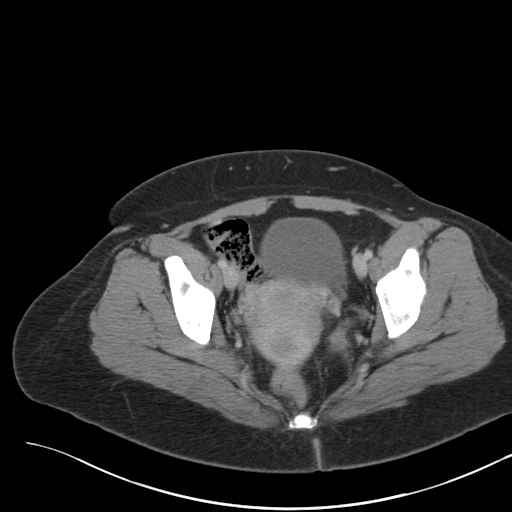
[im 27/121  soft-tissue]
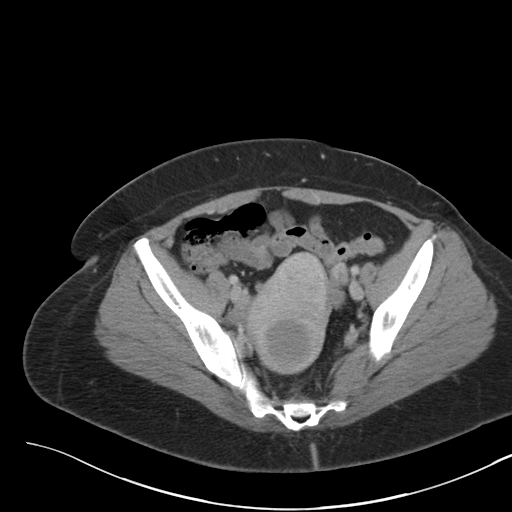
[im 34/121  soft-tissue]
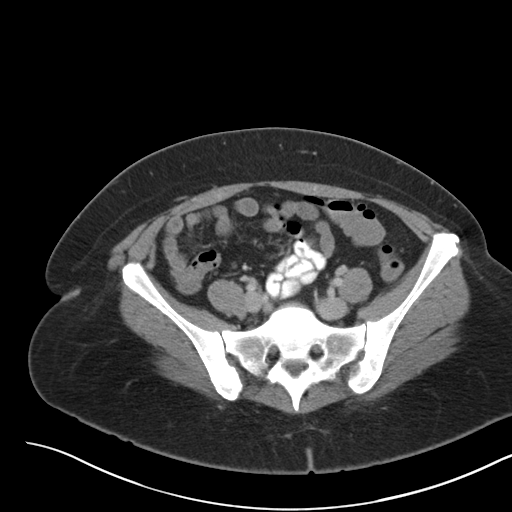
[im 47/121  soft-tissue]
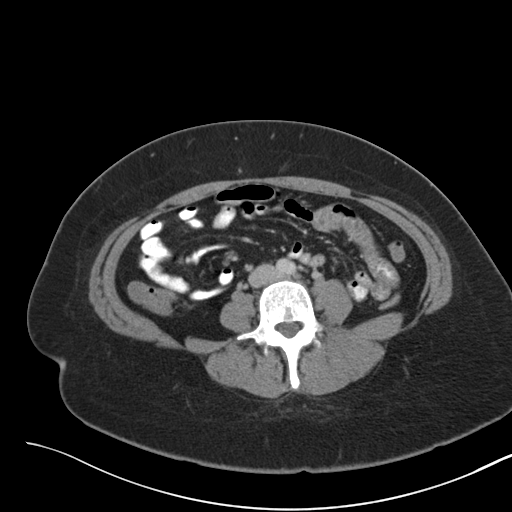
[im 54/121  soft-tissue]
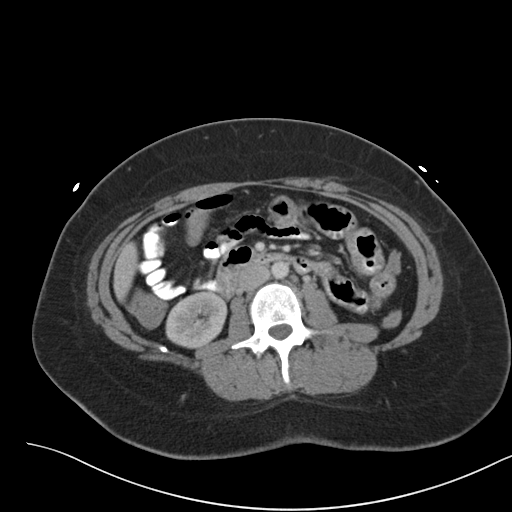
[im 67/121  soft-tissue]
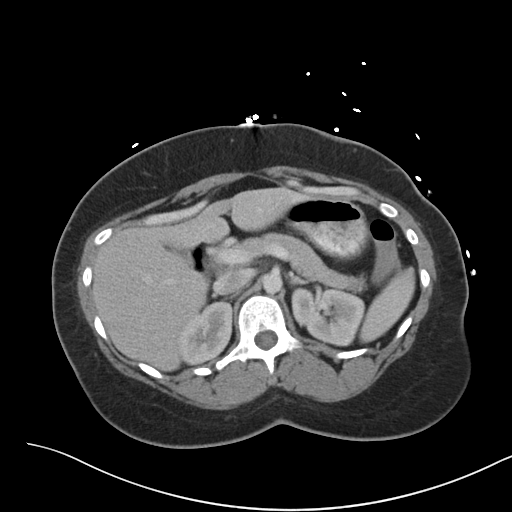
[im 74/121  soft-tissue]
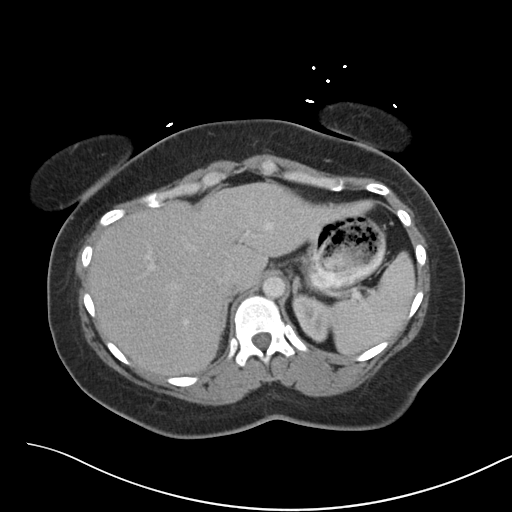
[im 87/121  soft-tissue]
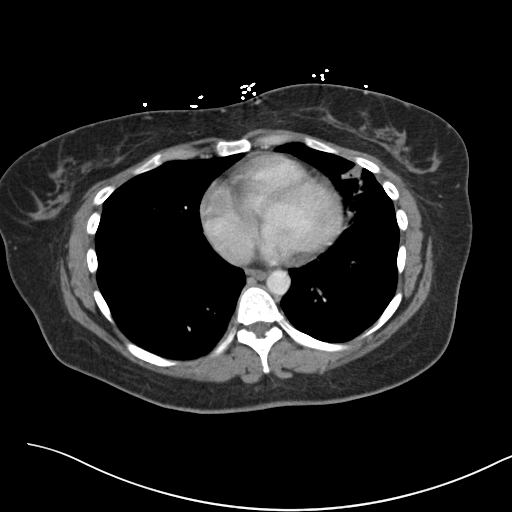
[im 87/121  bone]
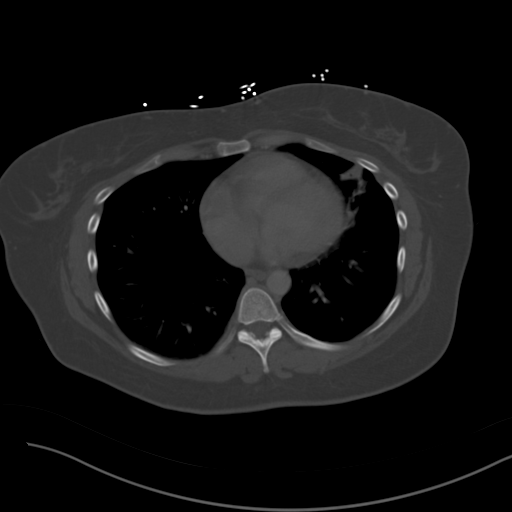
[im 94/121  soft-tissue]
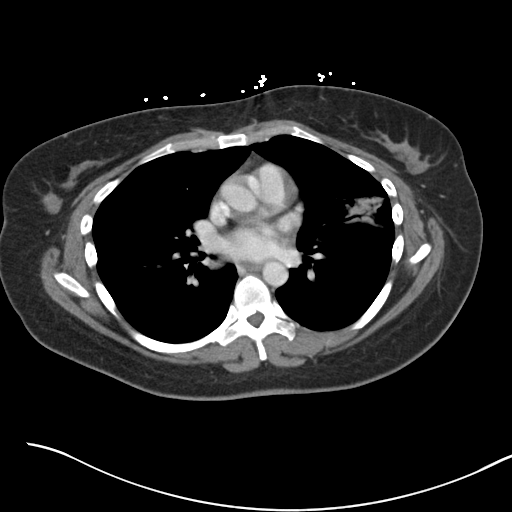
[im 101/121  soft-tissue]
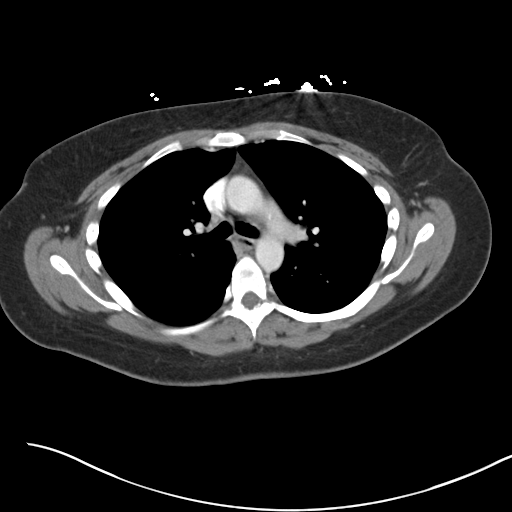
[im 114/121  soft-tissue]
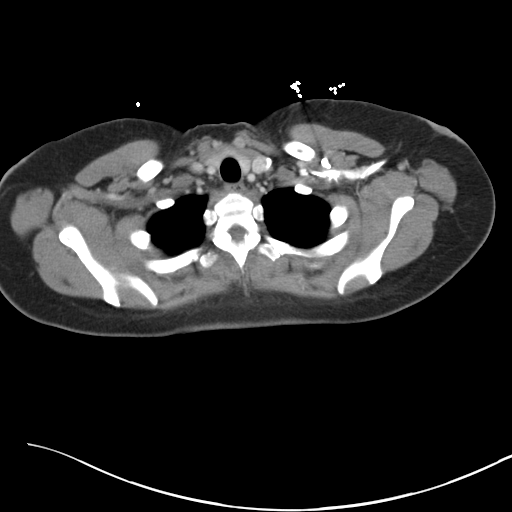

[Series 4: coronal a/|p · coronal · 0.68mm/px · 3 of 83 slices shown]
[im 28/83  soft-tissue]
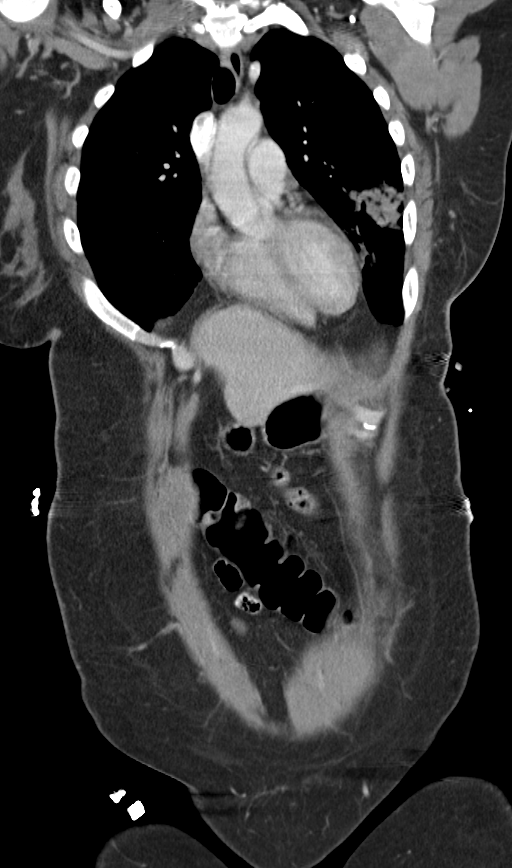
[im 37/83  soft-tissue]
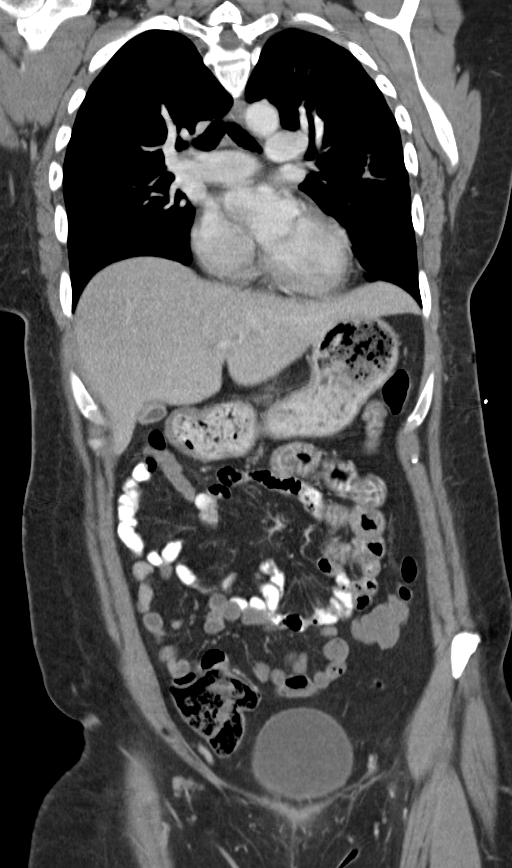
[im 46/83  soft-tissue]
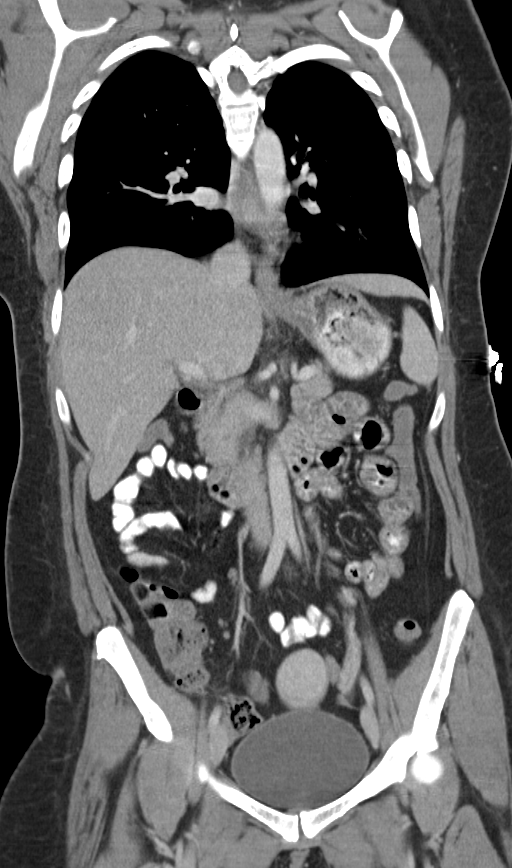

[15 of 46 positions shown; findings below may reference images not displayed]

FINDINGS: CT CHEST FINDINGS

Mediastinum/Nodes: Unremarkable

Lungs/Pleura: Lingular airspace opacity, images 25-35 series 7,
suspicious for pneumonia. Associated air bronchograms noted.

Musculoskeletal: Unremarkable

CT ABDOMEN PELVIS FINDINGS

Hepatobiliary: Contracted gallbladder.

Pancreas: Unremarkable

Spleen: Unremarkable

Adrenals/Urinary Tract: Unremarkable

Stomach/Bowel: No pneumatosis. No CT evidence of complication
related to endoscopy. No dilated bowel. Appendix normal.

Vascular/Lymphatic: Small retroperitoneal lymph nodes are not
pathologically enlarged by size criteria.

Reproductive: Uterine fibroids similar distribution and appearance
to the October 2015 exam.

Other: No supplemental non-categorized findings. No free
intraperitoneal gas to suggest bowel perforation.

Musculoskeletal: Unremarkable
IMPRESSION: 1. Airspace opacity in the lingula favoring pneumonia.
2. No pneumatosis or extraluminal gas.
3. Uterine fibroids, stable.

## 2017-02-05 IMAGING — DX DG CHEST 2V
2 series · 2 of 2 positions shown · non-contrast
Comparison: Chest x-ray 02/04/2016.  Chest CT 02/04/2016.

CLINICAL DATA: 44-year-old female with shortness of breath and
central chest pain since this afternoon.

EXAM:
CHEST  2 VIEW

[chest pa]
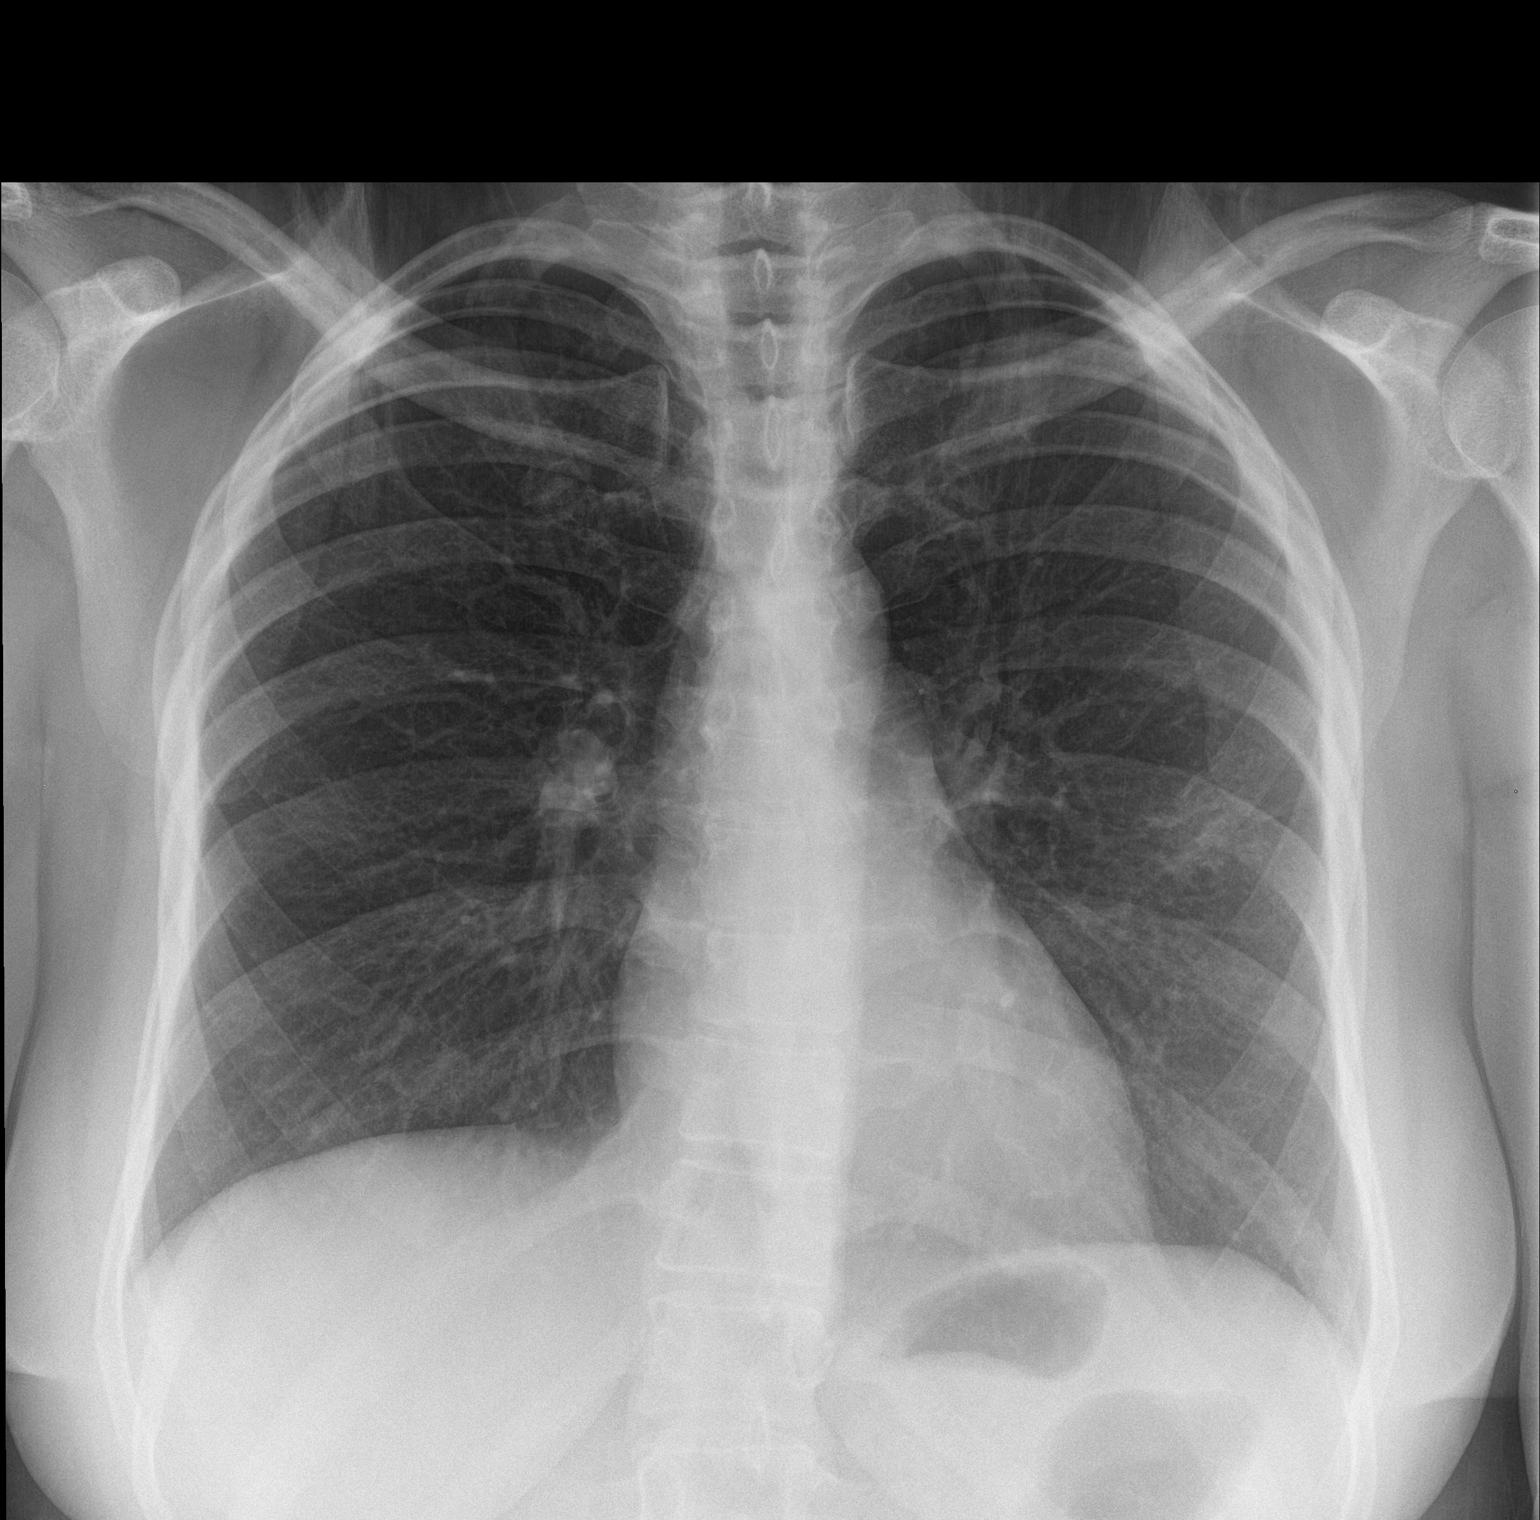

[chest lat]
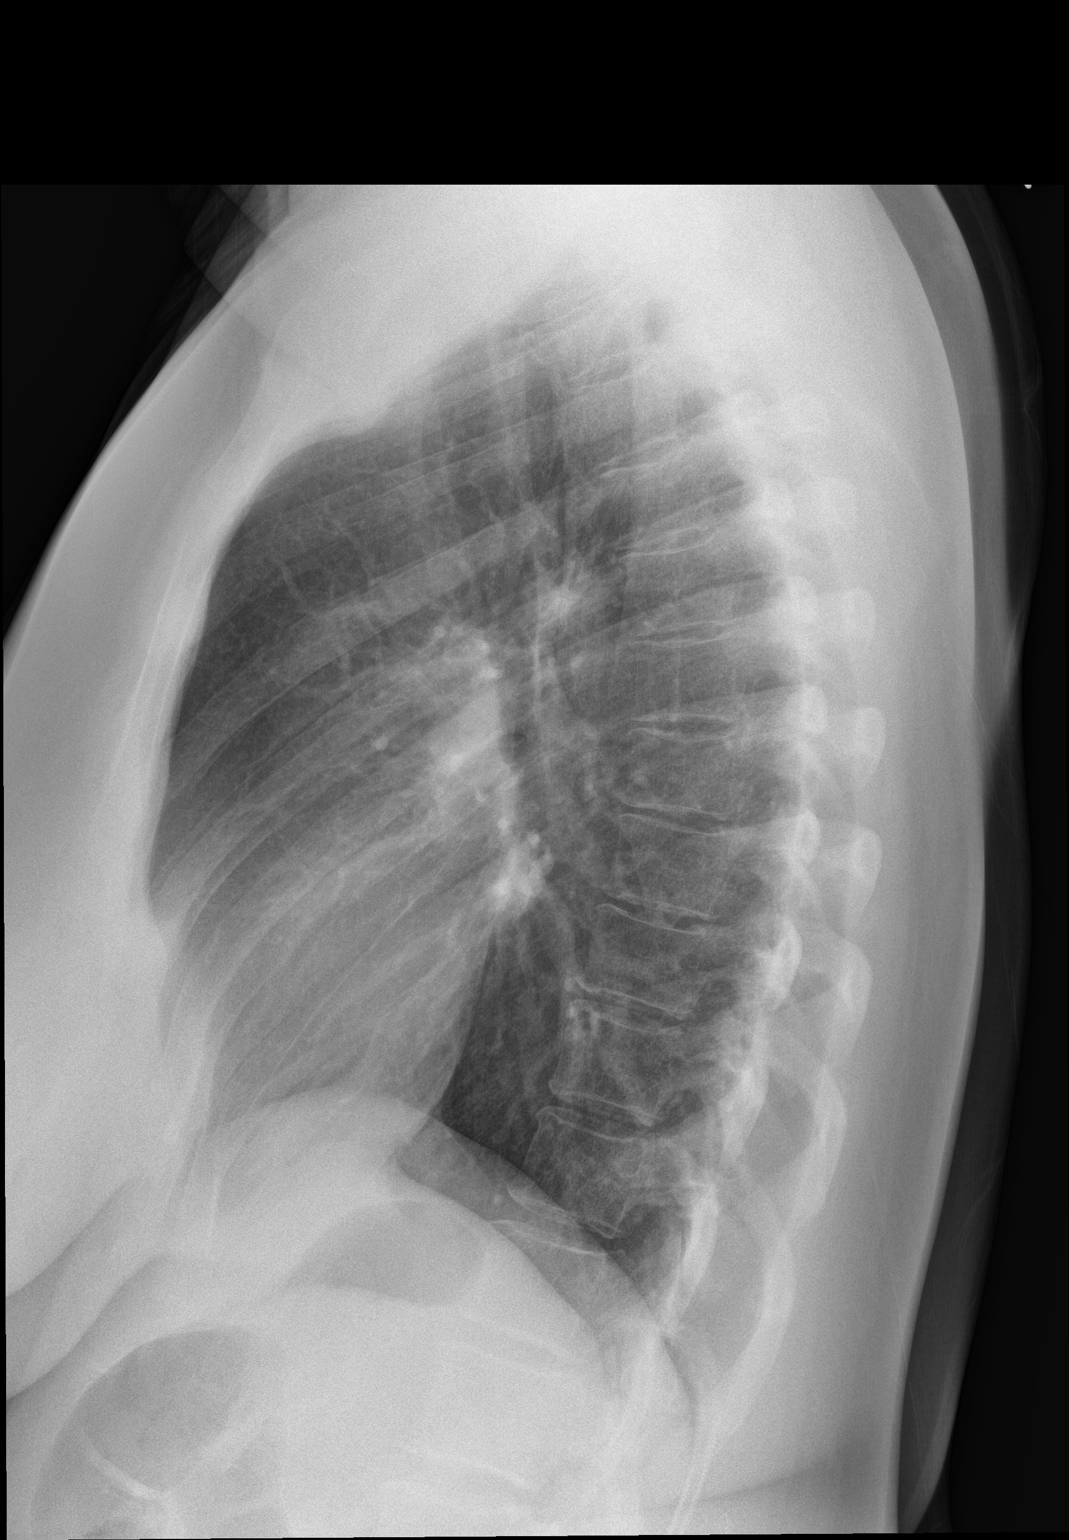

[2 of 2 positions shown; findings below may reference images not displayed]

FINDINGS: Mild diffuse peribronchial cuffing. Ill-defined opacity in the left
upper lobe, less apparent than the prior examination, compatible
with a resolving pneumonia. No new acute consolidative airspace
disease. No pleural effusions. No evidence of pulmonary edema. Heart
size and mediastinal contours are within normal limits.
IMPRESSION: 1. Resolving left upper lobe pneumonia.
2. Mild diffuse peribronchial cuffing suggestive of an acute
bronchitis.

## 2017-02-27 ENCOUNTER — Telehealth: Payer: Self-pay | Admitting: Cardiovascular Disease

## 2017-02-27 NOTE — Telephone Encounter (Signed)
Per DOD ECG normal-no acute findings, notified [facility]. They will fax notes when completed. ECG sent to be scanned

## 2017-02-27 NOTE — Telephone Encounter (Signed)
Patient called states that GI doctor wanted her to come in to see her cardiologist. Patient has pain on her left side and patient wanted to be seen today. Please call to discuss, thanks.

## 2017-02-27 NOTE — Telephone Encounter (Signed)
Received fax-no phone number to call back UC

## 2017-02-27 NOTE — Telephone Encounter (Signed)
Spoke with pt she states that she went to her gastric doctor and they wanted her to call and have an ECG/evaluation she has went to [facility] and gave her fax number for her to have them fax results and notes to [place]

## 2017-02-27 NOTE — Telephone Encounter (Signed)
Follow up      Calling to see if we received the EKG.  Urgent care is keeping the pt there until they hear from [place] on whether we need to see her.  Please call

## 2017-03-02 NOTE — Telephone Encounter (Signed)
Ms. Stacy [name] does not have cardiac chest pain.  She had a heart catheterization last year showing normal coronaries.  Follow up with PCP if she has pain in her chest or side.

## 2017-03-03 NOTE — Telephone Encounter (Signed)
LEFT DETAILED MESSAGE VM-DPR-WITH DR [place]'S MESSAGE

## 2017-03-10 ENCOUNTER — Telehealth: Payer: Self-pay | Admitting: Cardiovascular Disease

## 2017-03-10 NOTE — Telephone Encounter (Signed)
03/03/2017 Received faxed referral from [facility] for upcoming appointment with Dr. Oval Linsey. Records given to [facility]. cbr

## 2017-03-16 ENCOUNTER — Ambulatory Visit (INDEPENDENT_AMBULATORY_CARE_PROVIDER_SITE_OTHER): Payer: BLUE CROSS/BLUE SHIELD | Admitting: Cardiovascular Disease

## 2017-03-16 ENCOUNTER — Other Ambulatory Visit: Payer: Self-pay | Admitting: Obstetrics and Gynecology

## 2017-03-16 ENCOUNTER — Encounter: Payer: Self-pay | Admitting: Cardiovascular Disease

## 2017-03-16 VITALS — BP 114/86 | HR 84 | Ht 66.0 in | Wt 184.0 lb

## 2017-03-16 DIAGNOSIS — I10 Essential (primary) hypertension: Secondary | ICD-10-CM

## 2017-03-16 DIAGNOSIS — Z1231 Encounter for screening mammogram for malignant neoplasm of breast: Secondary | ICD-10-CM

## 2017-03-16 DIAGNOSIS — R002 Palpitations: Secondary | ICD-10-CM | POA: Diagnosis not present

## 2017-03-16 NOTE — Patient Instructions (Signed)
Medication Instructions:  ?Your physician recommends that you continue on your current medications as directed. Please refer to the Current Medication list given to you today.  ? ?Labwork: ?NONE ? ?Testing/Procedures: ?NONE ? ?Follow-Up: ?AS NEEDED  ? ?  ?

## 2017-03-16 NOTE — Progress Notes (Signed)
Cardiology Office Note   Date:  03/16/2017   ID:  Stacy [name], DOB May 18, 1971, MRN [id number]  PCP:  Benito Mccreedy, MD  Cardiologist:   Skeet Latch, MD   Chief Complaint  Patient presents with  . Chest Pain      History of Present Illness: Stacy [name] is a 46 y.o. female with hypertension, GERD, non-cardiac chest pain, shortness of breath, OSA on CPAP, anxiety and depression who presents for follow up.  Stacy [name] was seen in the ED 07/2015 with chest pain and shortness of breath.   Cardiac enzymes and EKG were unremarkable.  LE ultrasound was negative for DVT one week prior to her ED visit.  She followed up in clinic 08/2016 and reported repeated episodes of chest pain, shortness of breath, palpitations and dizziness.  She reported having an ETT in 2015 that was negative for ischemia.  She was referred for ETT 10/15/15 that was negative for ischemia but showed poorly-controlled hypertension.  She also had a 7 day event monitor 08/2015 that showed no arrhythmias despite reports of heart racing, skipped beats and chest pain.  She has had some episodes of documented sinus tachycardia and has been treated with metoprolol, though this was switched to propranolol due to fatigue.  She has been seen in the ED with chest pain quite frequently.  At times her blood pressure has been as high as 210 mmHg.  Her symptoms improved somewhat after she started taking clonazepam.  She was admitted 2/3-2/7 with chest pain.  At the time one troponin was 0.07, though the follow up value was <0.03.  She underwent LHC 01/04/16 that showed normal coronaries.  It was discovered that some of her chest pain was attributable to GERD.  Since her last appointment Stacy [name] has been feeling mostly better. She saw her gastroenterologist and mentioned that she continues to have occasional episodes of left sided chest pain.  He recommended that she be seen in urgent care.  They recommended  that she follow up back up with her cardiologist.  She has rare episodes of palpitations that are short lasting.  These are sometimes associated with chest pain.  It sometimes happens when she is feeling stressed and sometimes when she feels OK.  She continues to work on her diet.  She doesn't eat red meat or pork.  She also limits alcohol intake and works out regularly.  She hasn't been working out as much but has no exertional symptoms.    Past Medical History:  Diagnosis Date  . Abnormal Pap smear    cryo, normal since  . Anxiety    past Hx  . Arrhythmia   . Chest pain 09/14/2009   no pulmonary embolus by chest CT  . Depression    past Hx  . Elevated BP   . Esophageal stricture   . Essential hypertension 09/28/2009   Qualifier: Diagnosis of  By: [name] FNP, [place]    . Fibroid   . Fibroids 09/24/2015  . GERD (gastroesophageal reflux disease)    no meds  . Hypertension   . Palpitations   . Pneumonia   . Sickle cell trait ([place])   . Vaginal Pap smear, abnormal     Past Surgical History:  Procedure Laterality Date  . CARDIAC CATHETERIZATION N/A 01/04/2016   Procedure: Left Heart Cath and Coronary Angiography;  Surgeon: Belva Crome, MD;  Location: [facility];  Service: Cardiovascular;  Laterality: N/A;  . CESAREAN SECTION  2004  . LAPAROSCOPY FOR ECTOPIC PREGNANCY  2001  . WISDOM TOOTH EXTRACTION  2012     Current Outpatient Prescriptions  Medication Sig Dispense Refill  . propranolol (INDERAL) 20 MG tablet TAKE 1 TABLET (20 MG TOTAL) BY MOUTH 2 (TWO) TIMES DAILY. 60 tablet 3  . ranitidine (ZANTAC) 150 MG tablet Take 150 mg by mouth at bedtime as needed for heartburn.     No current facility-administered medications for this visit.    Facility-Administered Medications Ordered in Other Visits  Medication Dose Route Frequency Provider Last Rate Last Dose  . iohexol (OMNIPAQUE) 300 MG/ML solution 100 mL  100 mL Intravenous Once PRN Jola Schmidt, MD         Allergies:   Hydromorphone; Morphine; and Diphenhydramine    Social History:  The patient  reports that she has never smoked. She has never used smokeless tobacco. She reports that she does not drink alcohol or use drugs.   Family History:  The patient's family history includes Asthma in her daughter, maternal grandmother, and mother; Heart Problems in her mother; Hypertension in her mother and paternal uncle; Thyroid disease in her mother.    ROS:  Please see the history of present illness.   Otherwise, review of systems are positive for none.   All other systems are reviewed and negative.    PHYSICAL EXAM: VS:  BP 114/86   Pulse 84   Ht 5\' 6"  (1.676 m)   Wt 83.5 kg (184 lb)   BMI 29.70 kg/m  , BMI Body mass index is 29.7 kg/m. GENERAL:  Well appearing.  No acute distress HEENT:  Pupils equal round and reactive, fundi not visualized, oral mucosa unremarkable NECK:  No jugular venous distention, waveform within normal limits, carotid upstroke brisk and symmetric, no bruits LYMPHATICS:  No cervical adenopathy LUNGS:  Clear to auscultation bilaterally HEART:  RRR.  PMI not displaced or sustained,S1 and S2 within normal limits, no S3, no S4, no clicks, no rubs, no murmurs ABD:  Flat, positive bowel sounds normal in frequency in pitch, no bruits, no rebound, no guarding, no midline pulsatile mass, no hepatomegaly, no splenomegaly EXT:  2 plus pulses throughout, no edema, no cyanosis no clubbing SKIN:  No rashes no nodules NEURO:  Cranial nerves II through XII grossly intact, motor grossly intact throughout PSYCH:  Cognitively intact, oriented to person place and time   EKG:  EKG is not ordered today.  ETT 10/15/15:   Blood pressure demonstrated a hypertensive response to exercise.  Upsloping ST segment depression ST segment depression of 2 mm was noted during stress in the II, III, aVF, V6, V4 and V5 leads.  The patient experienced no angina during the stress  test.  Overall, the patient's exercise capacity was normal.  [facility] Treadmill Score:low risk  Negative stress test without evidence of ischemia at given workload. Upsloping ST depression noted - may be due to LVH. Significant hypertension noted with rest BP 186/102 prior to exercise.  Echo 11/09/15: LVEF 76%.  Mild thickening of the mitral valve.  Trace MR and TR.  RVSP 28 mmHg.  7 Day Event Monitor 09/16/15:  Quality: Fair. Baseline artifact. Predominant rhythm: sinus rhythm Pauses >2.5 seconds: 0 No PVCs or PACs were noted.  Patient did submit a symptom diary. She reported heart racing, skipped beats, and chest pain. At those times the underlying rhythm was sinus rhythm with rates from 70-93 bpm and one episode of sinus tachycardia, rate 113 bpm.  Echo 01/02/16: Study  Conclusions  - Left ventricle: The cavity size was normal. Wall thickness was increased in a pattern of mild LVH. Systolic function was normal. The estimated ejection fraction was in the range of 60% to 65%. Wall motion was normal; there were no regional wall motion abnormalities. Doppler parameters are consistent with abnormal left ventricular relaxation (grade 1 diastolic dysfunction). - Mitral valve: There was trivial regurgitation. - Right atrium: Central venous pressure (est): 3 mm Hg. - Tricuspid valve: There was trivial regurgitation. - Pulmonary arteries: PA peak pressure: 10 mm Hg (S). - Pericardium, extracardiac: A trivial pericardial effusion was identified posterior to the heart.  Impressions:  - Mild LVH with LVEF 60-65% and probable grade 1 diastolic dysfunction with normal LV filling pressure. Trivial mitral and tricuspid regurgitation. Possible trivial posterior pericardial effusion.  LHC 01/04/16:  Normal coronary arteries  Normal left ventricular function and hemodynamics  Chest pain is non-ischemic.   Recent Labs: 08/21/2016: ALT 10 12/20/2016: BUN 10; Creatinine,  Ser 0.57; Hemoglobin 11.6; Magnesium 1.8; Platelets 339; Potassium 3.3; Sodium 138    Lipid Panel    Component Value Date/Time   CHOL 197 01/02/2016 0249   TRIG 41 01/02/2016 0249   HDL 57 01/02/2016 0249   CHOLHDL 3.5 01/02/2016 0249   VLDL 8 01/02/2016 0249   LDLCALC 132 (H) 01/02/2016 0249      Wt Readings from Last 3 Encounters:  03/16/17 83.5 kg (184 lb)  12/20/16 81.6 kg (180 lb)  11/17/16 83.2 kg (183 lb 6.4 oz)      ASSESSMENT AND PLAN:  # Hypertension: Blood pressure is well-controlled.  Continue propranolol. She was encouraged to start back exercising more regularly.  # Chest/arm pain:  Ms. Krystiana Fornes is not have any coronary disease.  Her chest pain is not cardiac and not additional testing is indicated at this time.  She had a left heart cath 12/2015 that was normal.  # Palpitations: 7 day event monitor did not reveal any abnormalities.  Symptoms improved on propranolol.     The following changes have been made:  no change  Labs/ tests ordered today include:   No orders of the defined types were placed in this encounter.  Time spent: 25 minutes-Greater than 50% of this time was spent in counseling, explanation of diagnosis, planning of further management, and coordination of care.   Disposition:   FU with Stacy Mehlman C. Oval Linsey, MD as needed.   Signed, Skeet Latch, MD  03/16/2017 3:43 PM    [place]

## 2017-03-27 ENCOUNTER — Encounter (HOSPITAL_BASED_OUTPATIENT_CLINIC_OR_DEPARTMENT_OTHER): Payer: Self-pay | Admitting: *Deleted

## 2017-03-27 ENCOUNTER — Emergency Department (HOSPITAL_BASED_OUTPATIENT_CLINIC_OR_DEPARTMENT_OTHER)
Admission: EM | Admit: 2017-03-27 | Discharge: 2017-03-27 | Disposition: A | Discharge status: Home / Self Care | Payer: BLUE CROSS/BLUE SHIELD | Attending: Emergency Medicine | Admitting: Emergency Medicine

## 2017-03-27 DIAGNOSIS — R000 Tachycardia, unspecified: Secondary | ICD-10-CM

## 2017-03-27 DIAGNOSIS — I10 Essential (primary) hypertension: Secondary | ICD-10-CM | POA: Insufficient documentation

## 2017-03-27 DIAGNOSIS — R0602 Shortness of breath: Secondary | ICD-10-CM | POA: Diagnosis not present

## 2017-03-27 DIAGNOSIS — Z79899 Other long term (current) drug therapy: Secondary | ICD-10-CM | POA: Insufficient documentation

## 2017-03-27 DIAGNOSIS — R002 Palpitations: Secondary | ICD-10-CM | POA: Diagnosis present

## 2017-03-27 LAB — BASIC METABOLIC PANEL
ANION GAP: 7 (ref 5–15)
BUN: 9 mg/dL (ref 6–20)
CHLORIDE: 104 mmol/L (ref 101–111)
CO2: 27 mmol/L (ref 22–32)
Calcium: 8.7 mg/dL — ABNORMAL LOW (ref 8.9–10.3)
Creatinine, Ser: 0.64 mg/dL (ref 0.44–1.00)
GFR calc Af Amer: >60 mL/min (ref >60)
GFR calc non Af Amer: >60 mL/min (ref >60)
GLUCOSE: 90 mg/dL (ref 65–99)
POTASSIUM: 3.7 mmol/L (ref 3.5–5.1)
Sodium: 138 mmol/L (ref 135–145)

## 2017-03-27 LAB — CBC WITH DIFFERENTIAL/PLATELET
Basophils Absolute: 0 10*3/uL (ref 0.0–0.1)
Basophils Relative: 0 %
Eosinophils Absolute: 0.1 10*3/uL (ref 0.0–0.7)
Eosinophils Relative: 3 %
HEMATOCRIT: 32.8 % — AB (ref 36.0–46.0)
HEMOGLOBIN: 11.3 g/dL — AB (ref 12.0–15.0)
LYMPHS ABS: 1.8 10*3/uL (ref 0.7–4.0)
LYMPHS PCT: 49 %
MCH: 28.2 pg (ref 26.0–34.0)
MCHC: 34.5 g/dL (ref 30.0–36.0)
MCV: 81.8 fL (ref 78.0–100.0)
Monocytes Absolute: 0.4 10*3/uL (ref 0.1–1.0)
Monocytes Relative: 10 %
NEUTROS ABS: 1.4 10*3/uL — AB (ref 1.7–7.7)
Neutrophils Relative %: 38 %
Platelets: 328 10*3/uL (ref 150–400)
RBC: 4.01 MIL/uL (ref 3.87–5.11)
RDW: 13 % (ref 11.5–15.5)
WBC: 3.7 10*3/uL — ABNORMAL LOW (ref 4.0–10.5)

## 2017-03-27 MED ORDER — SODIUM CHLORIDE 0.9 % IV BOLUS (SEPSIS)
1000.0000 mL | Freq: Once | INTRAVENOUS | Status: AC
  Administered 2017-03-27: 1000 mL via INTRAVENOUS

## 2017-03-27 NOTE — ED Triage Notes (Signed)
Pt reports episode of tachycardia last evening, HR 140s. States she took propanolol at 0130. Reports feeling tired, right shoulder pain and "legs feel weak". States she called her cardiologist and was directed to come to ED for eval. Pt in NAD, speaking full sentences, HR 64 in triage.

## 2017-03-27 NOTE — ED Provider Notes (Signed)
[facility] DEPT [facility] Provider Note   CSN: [id number] Arrival date & time: 03/27/17  1003     History   Chief Complaint Chief Complaint  Patient presents with  . Shortness of Breath    HPI Stacy [name] is a 46 y.o. female.   Palpitations   This is a recurrent problem. The current episode started 6 to 12 hours ago. The problem has been resolved. The problem is associated with anxiety. On average, each episode lasts 30 minutes. Associated symptoms include shortness of breath. Pertinent negatives include no fever.    Past Medical History:  Diagnosis Date  . Abnormal Pap smear    cryo, normal since  . Anxiety    past Hx  . Arrhythmia   . Chest pain 09/14/2009   no pulmonary embolus by chest CT  . Depression    past Hx  . Elevated BP   . Esophageal stricture   . Essential hypertension 09/28/2009   Qualifier: Diagnosis of  By: [name] FNP, [place]    . Fibroid   . Fibroids 09/24/2015  . GERD (gastroesophageal reflux disease)    no meds  . Hypertension   . Palpitations   . Pneumonia   . Sickle cell trait ([place])   . Vaginal Pap smear, abnormal     Patient Active Problem List   Diagnosis Date Noted  . Normal coronary arteries 11/17/2016  . Family history of cardiovascular disease 03/21/2016  . Obstructive apnea 03/09/2016  . Abnormal uterine bleeding 02/22/2016  . Anxiety about health 02/22/2016  . Gastro-esophageal reflux disease without esophagitis 02/22/2016  . Fast heart beat 02/22/2016  . Fibroid 02/22/2016  . Elevated troponin   . Chest pain 01/01/2016  . Major depressive disorder 10/26/2015  . 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 Fibroids 09/24/2015  . Pain, joint, multiple sites 09/22/2015  . Visual disturbance 09/22/2015  . Numbness 09/22/2015  . Dyspnea 08/10/2015  . Obesity (BMI 30-39.9) 04/17/2015  . Environmental allergies 04/17/2015  . Thyromegaly 10/01/2014  . Edema 11/07/2013  . Atypical chest pain 04/30/2013  . Other  fatigue 05/24/2010  . VITAMIN D DEFICIENCY 09/30/2009  . PALPITATIONS, RECURRENT 09/30/2009  . CHEST PAIN, ATYPICAL, HX OF 09/30/2009  . Anxiety state 09/28/2009  . Depression with anxiety 09/28/2009  . GERD 09/28/2009  . Essential hypertension 09/28/2009    Past Surgical History:  Procedure Laterality Date  . CARDIAC CATHETERIZATION N/A 01/04/2016   Procedure: Left Heart Cath and Coronary Angiography;  Surgeon: Belva Crome, MD;  Location: [facility];  Service: Cardiovascular;  Laterality: N/A;  . CESAREAN SECTION  2004  . LAPAROSCOPY FOR ECTOPIC PREGNANCY  2001  . WISDOM TOOTH EXTRACTION  2012    OB History    Gravida Para Term Preterm AB Living   7 2 2  0 3 2   SAB TAB Ectopic Multiple Live Births   0 0 3 0         Home Medications    Prior to Admission medications   Medication Sig Start Date End Date Taking? Authorizing Provider  Dexlansoprazole (DEXILANT PO) Take by mouth.   Yes Historical Provider, MD  hydrochlorothiazide (MICROZIDE) 12.5 MG capsule Take 12.5 mg by mouth every other day.   Yes Historical Provider, MD  propranolol (INDERAL) 20 MG tablet TAKE 1 TABLET (20 MG TOTAL) BY MOUTH 2 (TWO) TIMES DAILY. 12/26/16  Yes Skeet Latch, MD  ranitidine (ZANTAC) 150 MG tablet Take 150 mg by mouth at bedtime as needed for heartburn.  Historical Provider, MD    Family History Family History  Problem Relation Age of Onset  . Asthma Mother   . Hypertension Mother   . Thyroid disease Mother   . Heart Problems Mother   . Hypertension Paternal Uncle   . Asthma Maternal Grandmother   . Asthma Daughter   . Colon cancer Neg Hx   . Esophageal cancer Neg Hx   . Stomach cancer Neg Hx     Social History Social History  Substance Use Topics  . Smoking status: Never Smoker  . Smokeless tobacco: Never Used  . Alcohol use No     Allergies   Hydromorphone; Morphine; and Diphenhydramine   Review of Systems Review of Systems  Constitutional: Negative for  fever.  Respiratory: Positive for shortness of breath.   Cardiovascular: Positive for palpitations.  All other systems reviewed and are negative.    Physical Exam Updated Vital Signs BP 111/70   Pulse (!) 58   Temp 98.5 F (36.9 C) (Oral)   Resp 19   Ht 5\' 6"  (1.676 m)   Wt 184 lb (83.5 kg)   LMP 03/16/2017   SpO2 100%   BMI 29.70 kg/m   Physical Exam  Constitutional: She is oriented to person, place, and time. She appears well-developed and well-nourished.  HENT:  Head: Normocephalic and atraumatic.  Eyes: Conjunctivae and EOM are normal.  Neck: Normal range of motion.  Cardiovascular: Normal rate and regular rhythm.   Pulmonary/Chest: Effort normal. No stridor. No respiratory distress. She has no wheezes.  Abdominal: Soft. She exhibits no distension.  Musculoskeletal: Normal range of motion. She exhibits no edema or deformity.  Neurological: She is alert and oriented to person, place, and time. No cranial nerve deficit. Coordination normal.  Skin: Skin is warm and dry.  Nursing note and vitals reviewed.    ED Treatments / Results  Labs (all labs ordered are listed, but only abnormal results are displayed) Labs Reviewed  CBC WITH DIFFERENTIAL/PLATELET - Abnormal; Notable for the following:       Result Value   WBC 3.7 (*)    Hemoglobin 11.3 (*)    HCT 32.8 (*)    Neutro Abs 1.4 (*)    All other components within normal limits  BASIC METABOLIC PANEL - Abnormal; Notable for the following:    Calcium 8.7 (*)    All other components within normal limits    EKG  EKG Interpretation  Date/Time:  Monday March 27 2017 11:42:54 EDT Ventricular Rate:  64 PR Interval:    QRS Duration: 107 QT Interval:  422 QTC Calculation: 436 R Axis:   50 Text Interpretation:  Sinus rhythm Baseline wander in lead(s) V3 V6 No significant change since last tracing Confirmed by [facility] MD, Corene Cornea [phone]) on 03/27/2017 12:16:17 PM       Radiology No results  found.  Procedures Procedures (including critical care time)  Medications Ordered in ED Medications  sodium chloride 0.9 % bolus 1,000 mL (1,000 mLs Intravenous New Bag/Given 03/27/17 1146)     Initial Impression / Assessment and Plan / ED Course  I have reviewed the triage vital signs and the nursing notes.  Pertinent labs & imaging results that were available during my care of the patient were reviewed by me and considered in my medical decision making (see chart for details).     Recurrent tachycardic episodes last night similar to previous. Improved with propranolol. Tired today, likely 2/2 decreased sleap last night. Spoke with cardiologist who  told to come here.  Appears well, slightly dry. Will check ecg/labs and give fluids. Likely an acute episode of her chronic tachycardic episodes.   Stable on monitor here. Vitals normal. Labs unremarkable.   Stable for dc to fu w/ PCP.   Final Clinical Impressions(s) / ED Diagnoses   Final diagnoses:  Tachycardia      Merrily Pew, MD 03/27/17 1311

## 2017-03-27 NOTE — ED Notes (Signed)
ED Provider at bedside. 

## 2017-04-10 ENCOUNTER — Other Ambulatory Visit: Payer: Self-pay

## 2017-04-10 MED ORDER — PROPRANOLOL HCL 20 MG PO TABS: ORAL_TABLET | ORAL | 3 refills | Status: DC

## 2017-04-19 ENCOUNTER — Other Ambulatory Visit: Payer: Self-pay

## 2017-04-19 ENCOUNTER — Ambulatory Visit
Admission: RE | Admit: 2017-04-19 | Discharge: 2017-04-19 | Disposition: A | Discharge status: Home / Self Care | Payer: BLUE CROSS/BLUE SHIELD | Source: Ambulatory Visit | Attending: Obstetrics and Gynecology | Admitting: Obstetrics and Gynecology

## 2017-04-19 DIAGNOSIS — Z1231 Encounter for screening mammogram for malignant neoplasm of breast: Secondary | ICD-10-CM

## 2017-04-19 NOTE — Telephone Encounter (Signed)
Opened in error

## 2017-04-25 ENCOUNTER — Other Ambulatory Visit: Payer: Self-pay | Admitting: Obstetrics and Gynecology

## 2017-04-25 DIAGNOSIS — R928 Other abnormal and inconclusive findings on diagnostic imaging of breast: Secondary | ICD-10-CM

## 2017-05-03 ENCOUNTER — Ambulatory Visit
Admission: RE | Admit: 2017-05-03 | Discharge: 2017-05-03 | Disposition: A | Discharge status: Home / Self Care | Payer: BLUE CROSS/BLUE SHIELD | Source: Ambulatory Visit | Attending: Obstetrics and Gynecology | Admitting: Obstetrics and Gynecology

## 2017-05-03 DIAGNOSIS — R928 Other abnormal and inconclusive findings on diagnostic imaging of breast: Secondary | ICD-10-CM

## 2017-06-29 ENCOUNTER — Encounter (HOSPITAL_COMMUNITY): Payer: Self-pay | Admitting: Emergency Medicine

## 2017-06-29 ENCOUNTER — Emergency Department (HOSPITAL_COMMUNITY): Payer: BLUE CROSS/BLUE SHIELD

## 2017-06-29 ENCOUNTER — Emergency Department (HOSPITAL_COMMUNITY)
Admission: EM | Admit: 2017-06-29 | Discharge: 2017-06-29 | Disposition: A | Discharge status: Home / Self Care | Payer: BLUE CROSS/BLUE SHIELD | Attending: Emergency Medicine | Admitting: Emergency Medicine

## 2017-06-29 DIAGNOSIS — S8012XA Contusion of left lower leg, initial encounter: Secondary | ICD-10-CM

## 2017-06-29 DIAGNOSIS — Y939 Activity, unspecified: Secondary | ICD-10-CM | POA: Insufficient documentation

## 2017-06-29 DIAGNOSIS — Z79899 Other long term (current) drug therapy: Secondary | ICD-10-CM | POA: Insufficient documentation

## 2017-06-29 DIAGNOSIS — Y929 Unspecified place or not applicable: Secondary | ICD-10-CM | POA: Insufficient documentation

## 2017-06-29 DIAGNOSIS — I10 Essential (primary) hypertension: Secondary | ICD-10-CM | POA: Insufficient documentation

## 2017-06-29 DIAGNOSIS — Y998 Other external cause status: Secondary | ICD-10-CM | POA: Insufficient documentation

## 2017-06-29 DIAGNOSIS — S92425A Nondisplaced fracture of distal phalanx of left great toe, initial encounter for closed fracture: Secondary | ICD-10-CM

## 2017-06-29 DIAGNOSIS — W19XXXA Unspecified fall, initial encounter: Secondary | ICD-10-CM

## 2017-06-29 DIAGNOSIS — W01198A Fall on same level from slipping, tripping and stumbling with subsequent striking against other object, initial encounter: Secondary | ICD-10-CM | POA: Insufficient documentation

## 2017-06-29 NOTE — Discharge Instructions (Signed)
Elevate and apply ice packs on and off to your foot.  Keep the toe buddy taped. Call your orthopedic or the podiatry Center listed to arrange a follow-up appointment. 600-800 mg of ibuprofen every 6-8 hours as needed for pain.

## 2017-06-29 NOTE — ED Triage Notes (Signed)
LT lower leg and LT great toe pain after slipping and falling last night.

## 2017-07-01 NOTE — ED Provider Notes (Signed)
[name] Provider Note   CSN: [id number] Arrival date & time: 06/29/17  1620     History   Chief Complaint Chief Complaint  Patient presents with  . Toe Injury    HPI Stacy [name] is a 46 y.o. female.  HPI   Stacy [name] is a 46 y.o. female who presents to the Emergency Department complaining of left lower leg and left great toe pain for one [name].  She describes a throbbing pain to her great toe after a mechanical fall.  States her toe struck a step.  Also complains of swelling to the toe.  Pain worse with palpation and weight bearing.  Milder pain described to the lower leg.  She denies numbness, calf pain or swelling, discoloration or injury of  the nail.    Past Medical History:  Diagnosis Date  . Abnormal Pap smear    cryo, normal since  . Anxiety    past Hx  . Arrhythmia   . Chest pain 09/14/2009   no pulmonary embolus by chest CT  . Depression    past Hx  . Elevated BP   . Esophageal stricture   . Essential hypertension 09/28/2009   Qualifier: Diagnosis of  By: [name] FNP, [place]    . Fibroid   . Fibroids 09/24/2015  . GERD (gastroesophageal reflux disease)    no meds  . Hypertension   . Palpitations   . Pneumonia   . Sickle cell trait ([place])   . Vaginal Pap smear, abnormal     Patient Active Problem List   Diagnosis Date Noted  . Normal coronary arteries 11/17/2016  . Family history of cardiovascular disease 03/21/2016  . Obstructive apnea 03/09/2016  . Abnormal uterine bleeding 02/22/2016  . Anxiety about health 02/22/2016  . Gastro-esophageal reflux disease without esophagitis 02/22/2016  . Fast heart beat 02/22/2016  . Fibroid 02/22/2016  . Elevated troponin   . Chest pain 01/01/2016  . Major depressive disorder 10/26/2015  . 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 Fibroids 09/24/2015  . Pain, joint, multiple sites 09/22/2015  . Visual disturbance 09/22/2015  . Numbness 09/22/2015  . Dyspnea 08/10/2015  .  Obesity (BMI 30-39.9) 04/17/2015  . Environmental allergies 04/17/2015  . Thyromegaly 10/01/2014  . Edema 11/07/2013  . Atypical chest pain 04/30/2013  . Other fatigue 05/24/2010  . VITAMIN D DEFICIENCY 09/30/2009  . PALPITATIONS, RECURRENT 09/30/2009  . CHEST PAIN, ATYPICAL, HX OF 09/30/2009  . Anxiety state 09/28/2009  . Depression with anxiety 09/28/2009  . GERD 09/28/2009  . Essential hypertension 09/28/2009    Past Surgical History:  Procedure Laterality Date  . CARDIAC CATHETERIZATION N/A 01/04/2016   Procedure: Left Heart Cath and Coronary Angiography;  Surgeon: Belva Crome, MD;  Location: [facility];  Service: Cardiovascular;  Laterality: N/A;  . CESAREAN SECTION  2004  . LAPAROSCOPY FOR ECTOPIC PREGNANCY  2001  . WISDOM TOOTH EXTRACTION  2012    OB History    Gravida Para Term Preterm AB Living   7 2 2  0 3 2   SAB TAB Ectopic Multiple Live Births   0 0 3 0         Home Medications    Prior to Admission medications   Medication Sig Start Date End Date Taking? Authorizing Provider  dexlansoprazole (DEXILANT) 60 MG capsule take Dexilant 60 mg capsule, delayed release daily   Yes [provider]  hydrochlorothiazide (MICROZIDE) 12.5 MG capsule hydrochlorothiazide 12.5 mg capsule daily  Yes [provider]  propranolol (INDERAL) 10 MG tablet propranolol 10 mg tablet  Take 0.5 tablets twice a [name] by oral route as directed.   Yes [provider]    Family History Family History  Problem Relation Age of Onset  . Asthma Mother   . Hypertension Mother   . Thyroid disease Mother   . Heart Problems Mother   . Hypertension Paternal Uncle   . Asthma Maternal Grandmother   . Asthma Daughter   . Colon cancer Neg Hx   . Esophageal cancer Neg Hx   . Stomach cancer Neg Hx     Social History Social History  Substance Use Topics  . Smoking status: Never Smoker  . Smokeless tobacco: Never Used  . Alcohol use No     Allergies     Hydromorphone; Morphine; and Diphenhydramine   Review of Systems Review of Systems  Constitutional: Negative for chills and fever.  Cardiovascular: Negative for chest pain.  Musculoskeletal: Positive for arthralgias (left great toe and left lower leg pain) and joint swelling (left great toe). Negative for back pain and neck pain.  Skin: Negative for color change and wound.  Neurological: Negative for weakness and numbness.  All other systems reviewed and are negative.    Physical Exam Updated Vital Signs BP 131/71 (BP Location: Right Arm)   Pulse 75   Temp 98.2 F (36.8 C) (Oral)   Resp 18   Ht 5\' 6"  (1.676 m)   Wt 82.6 kg (182 lb)   LMP 06/23/2017   SpO2 100%   BMI 29.38 kg/m   Physical Exam  Constitutional: She is oriented to person, place, and time. She appears well-developed and well-nourished. No distress.  HENT:  Head: Normocephalic and atraumatic.  Cardiovascular: Normal rate, regular rhythm and intact distal pulses.   Pulmonary/Chest: Effort normal and breath sounds normal.  Musculoskeletal: She exhibits edema and tenderness. She exhibits no deformity.  ttp of the distal left great toe, moderate edema. No subungual hematoma.  No open wound or bony deformity. Tender to palpation of the upper proximal left lower leg, no edema, knee non-tender.  Compartments are soft.  Neurological: She is alert and oriented to person, place, and time. No sensory deficit. She exhibits normal muscle tone. Coordination normal.  Skin: Skin is warm and dry. Capillary refill takes less than 2 seconds.  Nursing note and vitals reviewed.    ED Treatments / Results  Labs (all labs ordered are listed, but only abnormal results are displayed) Labs Reviewed - No data to display  EKG  EKG Interpretation None       Radiology Dg Tibia/fibula Left  Result Date: 06/29/2017 CLINICAL DATA:  Fall.  Left lower leg pain.  Initial encounter. EXAM: LEFT TIBIA AND FIBULA - 2 VIEW COMPARISON:   05/01/2012 FINDINGS: There is no evidence of fracture or other focal bone lesions. Soft tissues are unremarkable. No noted degenerative changes. IMPRESSION: Negative and stable when compared to 2013. Electronically Signed   By: Monte Fantasia M.D.   On: 06/29/2017 17:23   Dg Toe Great Left  Result Date: 06/29/2017 CLINICAL DATA:  46 year old female with left great toe pain after slipping and falling last night EXAM: LEFT GREAT TOE COMPARISON:  None. FINDINGS: Nondisplaced fracture through the lateral aspect of the base of the distal phalanx of the great toe. There is mild associated overlying soft tissue swelling. The remaining visualized bones and joints are unremarkable. IMPRESSION: Nondisplaced fracture through the lateral corner of the  base of the distal phalanx of the great toe. Electronically Signed   By: Jacqulynn Cadet M.D.   On: 06/29/2017 17:24    Procedures Procedures (including critical care time)  Medications Ordered in ED Medications - No data to display   Initial Impression / Assessment and Plan / ED Course  I have reviewed the triage vital signs and the nursing notes.  Pertinent labs & imaging results that were available during my care of the patient were reviewed by me and considered in my medical decision making (see chart for details).     XR results discussed.   Toe buddy taped, post op shoe applied.  NV intact.  Pt agrees to elevate, ice and orthopedic or podiatry f/u  Final Clinical Impressions(s) / ED Diagnoses   Final diagnoses:  Fall  Closed nondisplaced fracture of distal phalanx of left great toe, initial encounter  Contusion of left lower leg, initial encounter    New Prescriptions Discharge Medication List as of 06/29/2017  5:40 PM       Kem Parkinson, PA-C 07/01/17 [place], MD 07/04/17 7035

## 2017-07-03 ENCOUNTER — Ambulatory Visit: Payer: Self-pay | Admitting: Podiatry

## 2017-07-05 ENCOUNTER — Ambulatory Visit (INDEPENDENT_AMBULATORY_CARE_PROVIDER_SITE_OTHER): Payer: BLUE CROSS/BLUE SHIELD | Admitting: Podiatry

## 2017-07-05 DIAGNOSIS — S92422A Displaced fracture of distal phalanx of left great toe, initial encounter for closed fracture: Secondary | ICD-10-CM

## 2017-07-05 DIAGNOSIS — M779 Enthesopathy, unspecified: Secondary | ICD-10-CM

## 2017-07-06 NOTE — Progress Notes (Signed)
Subjective:    Patient ID: Stacy [name], female   DOB: 46 y.o.   MRN: [id number]   HPI patient presents concerned because she thinks she may have a broken big toe left and she's had some discomfort in her leg    Review of Systems  All other systems reviewed and are negative.       Objective:  Physical Exam  Constitutional: She appears well-developed and well-nourished.  Cardiovascular: Intact distal pulses.   Musculoskeletal: Normal range of motion.  Neurological: She is alert.  Skin: Skin is warm.  Nursing note and vitals reviewed.  neurovascular status intact muscle strength adequate range of motion within normal limits with patient found to have a deformed left hallux  that is swollen and painful but only to a moderate degree. Patient has mild discomfort lateral side of the left leg it's localized and had negative Homans sign and no indication patient of increased swelling     Assessment:   Traumatized left hallux with possibility for fracture and possibility for changing gait creating irritation of the lateral muscle group of the  lower leg      Plan:    H&P and x-rays reviewed. At this time we'll start physical therapy and also placed on diclofenac 50 mg twice a day to reduce inflammation and gave strict instructions of any swelling should occur or any other issues to go straight to the emergency room. Patient be seen back if symptoms persist  X-rays at this time did not indicate acute fracture the left hallux even though it's difficult to make complete determination

## 2017-07-17 ENCOUNTER — Emergency Department (HOSPITAL_COMMUNITY)
Admission: EM | Admit: 2017-07-17 | Discharge: 2017-07-17 | Disposition: A | Discharge status: Home / Self Care | Payer: Self-pay | Attending: Emergency Medicine | Admitting: Emergency Medicine

## 2017-07-17 ENCOUNTER — Encounter (HOSPITAL_COMMUNITY): Payer: Self-pay | Admitting: *Deleted

## 2017-07-17 DIAGNOSIS — I10 Essential (primary) hypertension: Secondary | ICD-10-CM | POA: Insufficient documentation

## 2017-07-17 DIAGNOSIS — Z79899 Other long term (current) drug therapy: Secondary | ICD-10-CM | POA: Insufficient documentation

## 2017-07-17 DIAGNOSIS — M5442 Lumbago with sciatica, left side: Secondary | ICD-10-CM | POA: Insufficient documentation

## 2017-07-17 LAB — URINALYSIS, ROUTINE W REFLEX MICROSCOPIC
Glucose, UA: NEGATIVE mg/dL
Hgb urine dipstick: NEGATIVE
KETONES UR: NEGATIVE mg/dL
Leukocytes, UA: NEGATIVE
Nitrite: NEGATIVE
PH: 7 (ref 5.0–8.0)
PROTEIN: 30 mg/dL — AB
Specific Gravity, Urine: 1.027 (ref 1.005–1.030)

## 2017-07-17 LAB — CBC WITH DIFFERENTIAL/PLATELET
BASOS PCT: 0 %
Basophils Absolute: 0 10*3/uL (ref 0.0–0.1)
EOS ABS: 0.1 10*3/uL (ref 0.0–0.7)
Eosinophils Relative: 3 %
HCT: 33.4 % — ABNORMAL LOW (ref 36.0–46.0)
HEMOGLOBIN: 11.6 g/dL — AB (ref 12.0–15.0)
LYMPHS ABS: 1.6 10*3/uL (ref 0.7–4.0)
Lymphocytes Relative: 39 %
MCH: 28.2 pg (ref 26.0–34.0)
MCHC: 34.7 g/dL (ref 30.0–36.0)
MCV: 81.3 fL (ref 78.0–100.0)
Monocytes Absolute: 0.5 10*3/uL (ref 0.1–1.0)
Monocytes Relative: 12 %
NEUTROS PCT: 46 %
Neutro Abs: 1.8 10*3/uL (ref 1.7–7.7)
Platelets: 349 10*3/uL (ref 150–400)
RBC: 4.11 MIL/uL (ref 3.87–5.11)
RDW: 13.8 % (ref 11.5–15.5)
WBC: 4 10*3/uL (ref 4.0–10.5)

## 2017-07-17 LAB — BASIC METABOLIC PANEL
ANION GAP: 8 (ref 5–15)
BUN: 9 mg/dL (ref 6–20)
CHLORIDE: 103 mmol/L (ref 101–111)
CO2: 27 mmol/L (ref 22–32)
Calcium: 8.7 mg/dL — ABNORMAL LOW (ref 8.9–10.3)
Creatinine, Ser: 0.77 mg/dL (ref 0.44–1.00)
GFR calc non Af Amer: >60 mL/min (ref >60)
Glucose, Bld: 67 mg/dL (ref 65–99)
POTASSIUM: 3.3 mmol/L — AB (ref 3.5–5.1)
SODIUM: 138 mmol/L (ref 135–145)

## 2017-07-17 LAB — PREGNANCY, URINE: Preg Test, Ur: NEGATIVE

## 2017-07-17 MED ORDER — HYDROCODONE-ACETAMINOPHEN 5-325 MG PO TABS: 1.0000 | ORAL_TABLET | ORAL | 0 refills | Status: DC | PRN

## 2017-07-17 MED ORDER — FENTANYL CITRATE (PF) 100 MCG/2ML IJ SOLN
50.0000 ug | Freq: Once | INTRAMUSCULAR | Status: DC
  Filled 2017-07-17: qty 2

## 2017-07-17 MED ORDER — METHOCARBAMOL 500 MG PO TABS: 500.0000 mg | ORAL_TABLET | Freq: Three times a day (TID) | ORAL | 0 refills | Status: DC | PRN

## 2017-07-17 MED ORDER — POLYETHYLENE GLYCOL 3350 17 G PO PACK: 17.0000 g | PACK | Freq: Every day | ORAL | 0 refills | Status: DC | PRN

## 2017-07-17 NOTE — ED Notes (Signed)
Pt states she has a history of ectopic pregnancy and states this feels the same way. Pt's LMP was around 2 weeks ago. States she took 4 pregnancy tests at home.

## 2017-07-17 NOTE — ED Provider Notes (Signed)
[facility] DEPT Provider Note   CSN: [id number] Arrival date & time: 07/17/17  0958     History   Chief Complaint Chief Complaint  Patient presents with  . Flank Pain    HPI Stacy [name] is a 46 y.o. female.  HPI Patient presents with left-sided low back pain that goes into her left lower abdomen. It is dull. Worse with movement. Goes down the leg. States she's been having difficulty walking due to the pain. Has had it for the last 4 days. Has had constipation 2. This is not unusual for her. Last bowel movement was a week ago. No diarrhea. No fevers. No vaginal bleeding or discharge. Last menses was 2 weeks ago. Has had ectopic pregnancies in the past that did feel like this. No fevers. No trauma. No loss of bladder bowel control. No dysuria. Past Medical History:  Diagnosis Date  . Abnormal Pap smear    cryo, normal since  . Anxiety    past Hx  . Arrhythmia   . Chest pain 09/14/2009   no pulmonary embolus by chest CT  . Depression    past Hx  . Elevated BP   . Esophageal stricture   . Essential hypertension 09/28/2009   Qualifier: Diagnosis of  By: [name] FNP, [place]    . Fibroid   . Fibroids 09/24/2015  . GERD (gastroesophageal reflux disease)    no meds  . Hypertension   . Palpitations   . Pneumonia   . Sickle cell trait ([place])   . Vaginal Pap smear, abnormal     Patient Active Problem List   Diagnosis Date Noted  . Normal coronary arteries 11/17/2016  . Family history of cardiovascular disease 03/21/2016  . Obstructive apnea 03/09/2016  . Abnormal uterine bleeding 02/22/2016  . Anxiety about health 02/22/2016  . Gastro-esophageal reflux disease without esophagitis 02/22/2016  . Fast heart beat 02/22/2016  . Fibroid 02/22/2016  . Elevated troponin   . Chest pain 01/01/2016  . Major depressive disorder 10/26/2015  . 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 Fibroids 09/24/2015  . Pain, joint, multiple sites 09/22/2015  . Visual disturbance  09/22/2015  . Numbness 09/22/2015  . Dyspnea 08/10/2015  . Obesity (BMI 30-39.9) 04/17/2015  . Environmental allergies 04/17/2015  . Thyromegaly 10/01/2014  . Edema 11/07/2013  . Atypical chest pain 04/30/2013  . Other fatigue 05/24/2010  . VITAMIN D DEFICIENCY 09/30/2009  . PALPITATIONS, RECURRENT 09/30/2009  . CHEST PAIN, ATYPICAL, HX OF 09/30/2009  . Anxiety state 09/28/2009  . Depression with anxiety 09/28/2009  . GERD 09/28/2009  . Essential hypertension 09/28/2009    Past Surgical History:  Procedure Laterality Date  . CARDIAC CATHETERIZATION N/A 01/04/2016   Procedure: Left Heart Cath and Coronary Angiography;  Surgeon: Belva Crome, MD;  Location: [facility];  Service: Cardiovascular;  Laterality: N/A;  . CESAREAN SECTION  2004  . LAPAROSCOPY FOR ECTOPIC PREGNANCY  2001  . WISDOM TOOTH EXTRACTION  2012    OB History    Gravida Para Term Preterm AB Living   7 2 2  0 3 2   SAB TAB Ectopic Multiple Live Births   0 0 3 0         Home Medications    Prior to Admission medications   Medication Sig Start Date End Date Taking? Authorizing Provider  dexlansoprazole (DEXILANT) 60 MG capsule take Dexilant 60 mg capsule, delayed release daily   Yes [provider]  hydrochlorothiazide (MICROZIDE) 12.5 MG capsule Take  12.5 mg by mouth every other day.   Yes [provider]  propranolol (INDERAL) 10 MG tablet propranolol 10 mg tablet  Take 0.5 tablets twice a day by oral route as directed.   Yes [provider]  HYDROcodone-acetaminophen (NORCO/VICODIN) 5-325 MG tablet Take 1-2 tablets by mouth every 4 (four) hours as needed. 07/17/17   Davonna Belling, MD  methocarbamol (ROBAXIN) 500 MG tablet Take 1 tablet (500 mg total) by mouth every 8 (eight) hours as needed for muscle spasms. 07/17/17   Davonna Belling, MD  polyethylene glycol [facility] Floria Raveling) packet Take 17 g by mouth daily as needed (while taking pain medicines). 07/17/17    Davonna Belling, MD    Family History Family History  Problem Relation Age of Onset  . Asthma Mother   . Hypertension Mother   . Thyroid disease Mother   . Heart Problems Mother   . Hypertension Paternal Uncle   . Asthma Maternal Grandmother   . Asthma Daughter   . Colon cancer Neg Hx   . Esophageal cancer Neg Hx   . Stomach cancer Neg Hx     Social History Social History  Substance Use Topics  . Smoking status: Never Smoker  . Smokeless tobacco: Never Used  . Alcohol use No     Allergies   Hydromorphone; Morphine; and Diphenhydramine   Review of Systems Review of Systems  Constitutional: Negative for appetite change.  HENT: Negative for congestion.   Respiratory: Negative for shortness of breath.   Gastrointestinal: Positive for abdominal pain and constipation.  Genitourinary: Negative for dysuria, vaginal bleeding and vaginal discharge.  Musculoskeletal: Positive for back pain.  Neurological: Negative for syncope.  Hematological: Negative for adenopathy.  Psychiatric/Behavioral: Negative for decreased concentration.     Physical Exam Updated Vital Signs BP 103/71   Pulse 71   Temp 98.5 F (36.9 C) (Oral)   Resp 18   Ht 5\' 5"  (1.651 m)   Wt 82.6 kg (182 lb)   LMP 06/23/2017   SpO2 100%   BMI 30.29 kg/m   Physical Exam  Constitutional: She appears well-developed.  HENT:  Head: Atraumatic.  Eyes: Pupils are equal, round, and reactive to light.  Neck: Neck supple.  Cardiovascular: Normal rate.   Pulmonary/Chest: Effort normal.  Abdominal: There is tenderness.  Left lower quadrant tenderness without rebound or guarding. No rash. No hernia palpated.  Musculoskeletal: She exhibits tenderness.  Tenderness on left lower back. Pain with straight leg raise on left side. Good flexion and extension in the lower study. Sensation intact distally. Good pulse in the left lower extremity. No rash.  Neurological: She is alert.  Skin: Skin is warm. Capillary  refill takes less than 2 seconds.     ED Treatments / Results  Labs (all labs ordered are listed, but only abnormal results are displayed) Labs Reviewed  URINALYSIS, ROUTINE W REFLEX MICROSCOPIC - Abnormal; Notable for the following:       Result Value   Bilirubin Urine SMALL (*)    Protein, ur 30 (*)    Bacteria, UA RARE (*)    Squamous Epithelial / LPF 0-5 (*)    All other components within normal limits  CBC WITH DIFFERENTIAL/PLATELET - Abnormal; Notable for the following:    Hemoglobin 11.6 (*)    HCT 33.4 (*)    All other components within normal limits  BASIC METABOLIC PANEL - Abnormal; Notable for the following:    Potassium 3.3 (*)    Calcium 8.7 (*)  All other components within normal limits  PREGNANCY, URINE    EKG  EKG Interpretation None       Radiology No results found.  Procedures Procedures (including critical care time)  Medications Ordered in ED Medications  fentaNYL (SUBLIMAZE) injection 50 mcg (50 mcg Intravenous Refused 07/17/17 1033)     Initial Impression / Assessment and Plan / ED Course  I have reviewed the triage vital signs and the nursing notes.  Pertinent labs & imaging results that were available during my care of the patient were reviewed by me and considered in my medical decision making (see chart for details).     Patient with flank pain. Left back going down the leg. No red flags but does have pain with raising the leg. Initially had some abdominal tenderness. Labs reassuring. Urine does not show infection or blood. Tenderness improved after pain medicine. Does have a history of constipation. I doubt this back pain is referred from abdomen. Will discharge home with neuroleptics muscle relaxers and pain medicine. States she'll follow-up with her doctors. Patient was worried about it being an ectopic pregnancy. With negative protein suggests this is much less likely.  Final Clinical Impressions(s) / ED Diagnoses   Final  diagnoses:  Acute left-sided low back pain with left-sided sciatica    New Prescriptions New Prescriptions   HYDROCODONE-ACETAMINOPHEN (NORCO/VICODIN) 5-325 MG TABLET    Take 1-2 tablets by mouth every 4 (four) hours as needed.   METHOCARBAMOL (ROBAXIN) 500 MG TABLET    Take 1 tablet (500 mg total) by mouth every 8 (eight) hours as needed for muscle spasms.   POLYETHYLENE GLYCOL (MIRALAX / GLYCOLAX) PACKET    Take 17 g by mouth daily as needed (while taking pain medicines).     Davonna Belling, MD 07/17/17 1209

## 2017-07-17 NOTE — ED Notes (Signed)
ED Provider at bedside. 

## 2017-07-17 NOTE — ED Triage Notes (Signed)
Pt comes in with left lower back pain that moves into her lower left abdomen. Pt denies any urinary problems. States she hasn't made a normal BM in a week and a half. Pt ambulatory.

## 2017-07-30 ENCOUNTER — Other Ambulatory Visit: Payer: Self-pay | Admitting: Cardiovascular Disease

## 2017-08-01 NOTE — Telephone Encounter (Signed)
Please review for refill, thanks ! 

## 2017-08-01 NOTE — Telephone Encounter (Signed)
Refill Request.  

## 2017-08-01 NOTE — Telephone Encounter (Signed)
REFILL 

## 2017-08-03 ENCOUNTER — Ambulatory Visit: Payer: Self-pay | Admitting: Cardiovascular Disease

## 2017-08-04 ENCOUNTER — Other Ambulatory Visit: Payer: Self-pay | Admitting: *Deleted

## 2017-08-04 NOTE — Telephone Encounter (Signed)
As of my records she wasn't taking this.  If she has restarted it, would refill with PCP.

## 2017-08-07 ENCOUNTER — Other Ambulatory Visit: Payer: Self-pay | Admitting: Cardiovascular Disease

## 2017-08-07 NOTE — Telephone Encounter (Signed)
Refill Request.  

## 2018-05-04 ENCOUNTER — Other Ambulatory Visit: Payer: Self-pay | Admitting: Obstetrics and Gynecology

## 2018-05-04 DIAGNOSIS — Z1231 Encounter for screening mammogram for malignant neoplasm of breast: Secondary | ICD-10-CM

## 2018-05-07 ENCOUNTER — Ambulatory Visit
Admission: RE | Admit: 2018-05-07 | Discharge: 2018-05-07 | Disposition: A | Discharge status: Home / Self Care | Payer: No Typology Code available for payment source | Source: Ambulatory Visit | Attending: Obstetrics and Gynecology | Admitting: Obstetrics and Gynecology

## 2018-05-07 ENCOUNTER — Ambulatory Visit: Payer: Self-pay

## 2018-05-07 DIAGNOSIS — Z1231 Encounter for screening mammogram for malignant neoplasm of breast: Secondary | ICD-10-CM

## 2018-09-20 ENCOUNTER — Telehealth: Payer: Self-pay | Admitting: *Deleted

## 2018-09-20 NOTE — Telephone Encounter (Signed)
Received refill request regarding patients HCTZ refill, left message [facility] [phone] contact PCP. This was not on patients medication list at last office visit

## 2018-11-30 ENCOUNTER — Encounter (HOSPITAL_COMMUNITY): Payer: Self-pay | Admitting: Emergency Medicine

## 2018-11-30 ENCOUNTER — Other Ambulatory Visit: Payer: Self-pay

## 2018-11-30 ENCOUNTER — Inpatient Hospital Stay (HOSPITAL_COMMUNITY)
Admission: AD | Admit: 2018-11-30 | Discharge: 2018-11-30 | Discharge status: Against Medical Advice | Payer: No Typology Code available for payment source | Attending: Obstetrics and Gynecology | Admitting: Obstetrics and Gynecology

## 2018-11-30 DIAGNOSIS — R1031 Right lower quadrant pain: Secondary | ICD-10-CM | POA: Insufficient documentation

## 2018-11-30 DIAGNOSIS — Z5329 Procedure and treatment not carried out because of patient's decision for other reasons: Secondary | ICD-10-CM | POA: Insufficient documentation

## 2018-11-30 LAB — URINALYSIS, ROUTINE W REFLEX MICROSCOPIC
BACTERIA UA: NONE SEEN
Bilirubin Urine: NEGATIVE
GLUCOSE, UA: NEGATIVE mg/dL
KETONES UR: NEGATIVE mg/dL
Leukocytes, UA: NEGATIVE
NITRITE: NEGATIVE
PROTEIN: NEGATIVE mg/dL
Specific Gravity, Urine: 1.021 (ref 1.005–1.030)
pH: 5 (ref 5.0–8.0)

## 2018-11-30 LAB — POCT PREGNANCY, URINE: PREG TEST UR: NEGATIVE

## 2018-11-30 NOTE — MAU Note (Signed)
Pain in RLQ for last wk. Feels off balance.  Cycle has been irregular.  Hx of ectopic preg and fibroids.  Neg preg test.  Pain was so severe, had to call daughter to bring her  In .

## 2019-03-05 ENCOUNTER — Telehealth: Payer: Self-pay | Admitting: Cardiovascular Disease

## 2019-03-05 NOTE — Telephone Encounter (Signed)
New message   STAT if HR is under 50 or over 120 (normal HR is 60-100 beats per minute)  1) What is your heart rate? 140  2) Do you have a log of your heart rate readings (document readings)? Episodes of elevated  HR over the last month  3) Do you have any other symptoms? no

## 2019-03-05 NOTE — Telephone Encounter (Signed)
Virtual Visit Pre-Appointment Phone Call  Steps For Call:  1. Confirm consent - "In the setting of the current Covid19 crisis, you are scheduled for a (phone or video) visit with your provider on (date) at (time).  Just as we do with many in-office visits, in order for you to participate in this visit, we must obtain consent.  If you'd like, I can send this to your mychart (if signed up) or email for you to review.  Otherwise, I can obtain your verbal consent now.  All virtual visits are billed to your insurance company just like a normal visit would be.  By agreeing to a virtual visit, we'd like you to understand that the technology does not allow for your provider to perform an examination, and thus may limit your provider's ability to fully assess your condition.  Finally, though the technology is pretty good, we cannot assure that it will always work on either your or our end, and in the setting of a video visit, we may have to convert it to a phone-only visit.  In either situation, we cannot ensure that we have a secure connection.  Are you willing to proceed?"  2. Give patient instructions for WebEx download to smartphone as below if video visit  3. Advise patient to be prepared with any vital sign or heart rhythm information, their current medicines, and a piece of paper and pen handy for any instructions they may receive the day of their visit  4. Inform patient they will receive a phone call 15 minutes prior to their appointment time (may be from unknown caller ID) so they should be prepared to answer  5. Confirm that appointment type is correct in Epic appointment notes (video vs telephone)    TELEPHONE CALL NOTE  Stacy [name] has been deemed a candidate for a follow-up tele-health visit to limit community exposure during the Covid-19 pandemic. I spoke with the patient via phone to ensure availability of phone/video source, confirm preferred email & phone number, and  discuss instructions and expectations.  I reminded Stacy [name] to be prepared with any vital sign and/or heart rhythm information that could potentially be obtained via home monitoring, at the time of her visit. I reminded Stacy [name] to expect a phone call at the time of her visit if her visit.  Did the patient verbally acknowledge consent to treatment? Yes  Therisa Doyne 03/05/2019 4:06 PM  Pt states she does not have BP cuff at home but will try to go to pharmacy to check her BP prior to visit. She does have a weight scale. She stated the email on file, she does still have access to and is going to update her MyChart prior to visit.   Pt also stated she reviewed consent with Triage nurse earlier today. I reviewed medications and allergies with her. She is currently not taking any meds. Pharmacy updated.  CONSENT FOR TELE-HEALTH VISIT - PLEASE REVIEW  I hereby voluntarily request, consent and authorize [facility] and its employed or contracted physicians, physician assistants, nurse practitioners or other licensed health care professionals (the Practitioner), to provide me with telemedicine health care services (the "Services") as deemed necessary by the treating Practitioner. I acknowledge and consent to receive the Services by the Practitioner via telemedicine. I understand that the telemedicine visit will involve communicating with the Practitioner through live audiovisual communication technology and the disclosure of certain medical information by electronic transmission. I acknowledge that I  have been given the opportunity to request an in-person assessment or other available alternative prior to the telemedicine visit and am voluntarily participating in the telemedicine visit.  I understand that I have the right to withhold or withdraw my consent to the use of telemedicine in the course of my care at any time, without affecting my right to future care or treatment,  and that the Practitioner or I may terminate the telemedicine visit at any time. I understand that I have the right to inspect all information obtained and/or recorded in the course of the telemedicine visit and may receive copies of available information for a reasonable fee.  I understand that some of the potential risks of receiving the Services via telemedicine include:  Marland Kitchen Delay or interruption in medical evaluation due to technological equipment failure or disruption; . Information transmitted may not be sufficient (e.g. poor resolution of images) to allow for appropriate medical decision making by the Practitioner; and/or  . In rare instances, security protocols could fail, causing a breach of personal health information.  Furthermore, I acknowledge that it is my responsibility to provide information about my medical history, conditions and care that is complete and accurate to the best of my ability. I acknowledge that Practitioner's advice, recommendations, and/or decision may be based on factors not within their control, such as incomplete or inaccurate data provided by me or distortions of diagnostic images or specimens that may result from electronic transmissions. I understand that the practice of medicine is not an exact science and that Practitioner makes no warranties or guarantees regarding treatment outcomes. I acknowledge that I will receive a copy of this consent concurrently upon execution via email to the email address I last provided but may also request a printed copy by calling the office of [facility].    I understand that my insurance will be billed for this visit.   I have read or had this consent read to me. . I understand the contents of this consent, which adequately explains the benefits and risks of the Services being provided via telemedicine.  . I have been provided ample opportunity to ask questions regarding this consent and the Services and have had my questions  answered to my satisfaction. . I give my informed consent for the services to be provided through the use of telemedicine in my medical care  By participating in this telemedicine visit I agree to the above.

## 2019-03-05 NOTE — Telephone Encounter (Signed)
Spoke with the pt. Pt sts that she has been doing well since last seen by Dr.Millport in April 2018. Starting about 4-6 weeks ago she starting having increased episodes of Tachycardia. She denies pre-syncope or syncope. Last night while watching TV she began to feel anxious and uneasy, she checked he HR with a pulse ox and it was in the 140's.she does not have a bp machine at home. She wanted to avoid gong to th ER, she performed relaxation exercises and things began to cal down after a hour.  She has not needed propanolol and she does not have a current script. Pt sts that she has been working from home and is taking it is easy today. She is currently asymptomatic. Our scheduler has scheduled an e-visit appt with Dr. Oval Linsey for 03/07/19. Adv the pt to stay hydrated, also she can attempt to use the valsalva maneuver if tachy episode reoccurs. Pt verbalized understanding and voiced appreciation for the assistance.

## 2019-03-06 ENCOUNTER — Other Ambulatory Visit: Payer: Self-pay

## 2019-03-06 NOTE — Progress Notes (Signed)
Virtual Visit via Video Note   This visit type was conducted due to national recommendations for restrictions regarding the COVID-19 Pandemic (e.g. social distancing) in an effort to limit this patient's exposure and mitigate transmission in our community.  Due to her co-morbid illnesses, this patient is at least at moderate risk for complications without adequate follow up.  This format is felt to be most appropriate for this patient at this time.  All issues noted in this document were discussed and addressed.  A limited physical exam was performed with this format.  Please refer to the patient's chart for her consent to telehealth for [facility].   Evaluation Performed:  Follow-up visit  Date:  03/07/2019   ID:  Stacy [name], DOB [birth year], MRN [id number]  Patient Location: Home  Provider Location: Office  PCP:  Benito Mccreedy, MD  Cardiologist:  No primary care provider on file.  Electrophysiologist:  None   Chief Complaint:  tachycardia  History of Present Illness:    Stacy [name] is a 48 y.o. female who presents via audio/video conferencing for a telehealth visit today.    Stacy [name] is a 48 y.o. female with palpitations, hypertension, GERD, non-cardiac chest pain, shortness of breath, OSA on CPAP, anxiety and depression who presents for follow up.  Ms. Stacy [name] was seen in the ED 07/2015 with chest pain and shortness of breath.  Cardiac enzymes and EKG were unremarkable. LE ultrasound was negative for DVT one week prior to her ED visit.  She followed up in clinic 08/2016 and reported repeated episodes of chest pain, shortness of breath, palpitations and dizziness.  She reported having an ETT in 2015 that was negative for ischemia.  She was referred for ETT 10/15/15 that was negative for ischemia but showed poorly-controlled hypertension.  She also had a 7 day event monitor 08/2015 that showed no arrhythmias despite reports of heart racing,  skipped beats and chest pain.  She has had some episodes of documented sinus tachycardia and has been treated with metoprolol, though this was switched to propranolol due to fatigue.  She has been seen in the ED with chest pain quite frequently.  At times her blood pressure has been as high as 210 mmHg.  Her symptoms improved somewhat after she started taking clonazepam.  She was admitted 12/2015 with chest pain.  At the time one troponin was 0.07, though the follow up value was <0.03.  She underwent LHC 01/04/16 that showed normal coronaries.  It was discovered that some of her chest pain was attributable to GERD.  Since her last appointment Stacy [name] has been well.  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  She stopped taking both propranolol and hydrochlorothiazide approximately 1 year ago.  Her palpitations and blood pressure were both well-controlled.  However lately, she notes that her blood pressure has been more elevated.  When her blood pressure goes into the 130s she feels poorly when she feels this way she checks it and it is in the 130s to 150s.  Other days it is within normal limits.  She is also experienced 2 episodes of palpitations lately.  The first occurred when sitting and watching television.  She was not stressed or anxious at the time.  She checked her heart rate and it was 148.  This lasted for couple of hours.  She had another episode last week.  Her only other complaint right now is sinus congestion, mild cough, and  low-grade fever.  She called her PCP who called in azithromycin.  She is also self quarantining due to concern that she could have COVID-19.  She has not experienced any lower extremity edema, orthopnea, or PND.  She denies shortness of breath.  The patient does have symptoms concerning for COVID-19 infection (fever, chills, cough, or new shortness of breath).    Past Medical History:  Diagnosis Date   Abnormal Pap smear    cryo, normal since     Anxiety    past Hx   Arrhythmia    Chest pain 09/14/2009   no pulmonary embolus by chest CT   Depression    past Hx   Elevated BP    Esophageal stricture    Essential hypertension 09/28/2009   Qualifier: Diagnosis of  By: [name] FNP, Melissa S     Fibroid    Fibroids 09/24/2015   GERD (gastroesophageal reflux disease)    no meds   Hypertension    Palpitations    Pneumonia    Sickle cell trait (HCC)    Vaginal Pap smear, abnormal    Past Surgical History:  Procedure Laterality Date   CARDIAC CATHETERIZATION N/A 01/04/2016   Procedure: Left Heart Cath and Coronary Angiography;  Surgeon: Belva Crome, MD;  Location: [place] [facility];  Service: Cardiovascular;  Laterality: N/A;   CESAREAN SECTION  2004   LAPAROSCOPY FOR ECTOPIC PREGNANCY  2001   WISDOM TOOTH EXTRACTION  2012     Current Meds  Medication Sig   azithromycin (ZITHROMAX) 250 MG tablet Take by mouth daily. 2 x 1 day and then daily for 4 days   predniSONE (DELTASONE) 20 MG tablet Take 20 mg by mouth daily with breakfast.     Allergies:   Hydromorphone; Morphine; and Diphenhydramine   Social History   Tobacco Use   Smoking status: Never Smoker   Smokeless tobacco: Never Used  Substance Use Topics   Alcohol use: No    Alcohol/week: 0.0 standard drinks   Drug use: No     Family Hx: The patient's family history includes Asthma in her daughter, maternal grandmother, and mother; Heart Problems in her mother; Hypertension in her mother and paternal uncle; Thyroid disease in her mother. There is no history of Colon cancer, Esophageal cancer, or Stomach cancer.  ROS:   Please see the history of present illness.     All other systems reviewed and are negative.   Prior CV studies:   The following studies were reviewed today:  ETT 10/15/15:   Blood pressure demonstrated a hypertensive response to exercise.  Upsloping ST segment depression ST segment depression of 2 mm was  noted during stress in the II, III, aVF, V6, V4 and V5 leads.  The patient experienced no angina during the stress test.  Overall, the patient's exercise capacity was normal.  Duke Treadmill Score:low risk  Negative stress test without evidence of ischemia at given workload. Upsloping ST depression noted - may be due to LVH. Significant hypertension noted with rest BP 186/102 prior to exercise.  Echo 11/09/15: LVEF 76%.  Mild thickening of the mitral valve.  Trace MR and TR.  RVSP 28 mmHg.  7 Day Event Monitor 09/16/15:  Quality: Fair. Baseline artifact. Predominant rhythm: sinus rhythm Pauses >2.5 seconds: 0 No PVCs or PACs were noted.  Patient did submit a symptom diary. She reported heart racing, skipped beats, and chest pain. At those times the underlying rhythm was sinus rhythm with rates from 70-93  bpm and one episode of sinus tachycardia, rate 113 bpm.  Echo 01/02/16: Study Conclusions  - Left ventricle: The cavity size was normal. Wall thickness was increased in a pattern of mild LVH. Systolic function was normal. The estimated ejection fraction was in the range of 60% to 65%. Wall motion was normal; there were no regional wall motion abnormalities. Doppler parameters are consistent with abnormal left ventricular relaxation (grade 1 diastolic dysfunction). - Mitral valve: There was trivial regurgitation. - Right atrium: Central venous pressure (est): 3 mm Hg. - Tricuspid valve: There was trivial regurgitation. - Pulmonary arteries: PA peak pressure: 10 mm Hg (S). - Pericardium, extracardiac: A trivial pericardial effusion was identified posterior to the heart.  Impressions:  - Mild LVH with LVEF 60-65% and probable grade 1 diastolic dysfunction with normal LV filling pressure. Trivial mitral and tricuspid regurgitation. Possible trivial posterior pericardial effusion.  LHC 01/04/16:  Normal coronary arteries  Normal left ventricular  function and hemodynamics  Chest pain is non-ischemic.  Labs/Other Tests and Data Reviewed:    EKG:  No ECG reviewed.  Recent Labs: No results found for requested labs within last 8760 hours.   Recent Lipid Panel Lab Results  Component Value Date/Time   CHOL 197 01/02/2016 02:49 AM   TRIG 41 01/02/2016 02:49 AM   HDL 57 01/02/2016 02:49 AM   CHOLHDL 3.5 01/02/2016 02:49 AM   LDLCALC 132 (H) 01/02/2016 02:49 AM    Wt Readings from Last 3 Encounters:  03/07/19 176 lb (79.8 kg)  11/30/18 180 lb 8 oz (81.9 kg)  07/17/17 182 lb (82.6 kg)     Objective:    BP 125/80    Pulse 86    Temp 99.1 F (37.3 C)    Ht 5\' 6"  (1.676 m)    Wt 176 lb (79.8 kg)    SpO2 99%    BMI 28.41 kg/m  GENERAL: Well-appearing.  No acute distress. HEENT: Pupils equal round.  Oral mucosa unremarkable NECK:  No jugular venous distention, no visible thyromegaly EXT:  No edema, no cyanosis no clubbing SKIN:  No rashes no nodules NEURO:  Speech fluent.  Cranial nerves grossly intact.  Moves all 4 extremities freely PSYCH:  Cognitively intact, oriented to person place and time   ASSESSMENT & PLAN:    # Hypertension: Blood pressure is labile and elevated to the 150s at times.  Today it was controlled.  Resume HCTZ at a lower dose.  She will try 6.25mg  and track her BP.   # Chest/arm pain:  Resolved.  Ms. Lugenia Assefa is not have any coronary disease.  She had a left heart cath 12/2015 that was normal.  # Palpitations: 7 day event monitor did not reveal any abnormalities.  Symptoms improved on propranolol but she is no longer taking.  We will refill propranolol 20 mg bid prn.   # URI: She is taking Azithromycin and has been advised to continue 14 day quarantine as she could have COVID-19.  She will seek urgent care if she develops respiratory compromise.  COVID-19 Education: The signs and symptoms of COVID-19 were discussed with the patient and how to seek care for testing (follow up with PCP or arrange  E-visit).  The importance of social distancing was discussed today.  Time:   Today, I have spent 22 minutes with the patient with telehealth technology discussing the above problems.     Medication Adjustments/Labs and Tests Ordered: Current medicines are reviewed at length with the patient today.  Concerns regarding medicines are outlined above.   Tests Ordered: No orders of the defined types were placed in this encounter.  Medication Changes: No orders of the defined types were placed in this encounter.   Disposition:  Follow up in 6 week(s)  Signed, Skeet Latch, MD  03/07/2019 9:38 AM    [facility]

## 2019-03-07 ENCOUNTER — Telehealth: Payer: Self-pay | Admitting: Cardiovascular Disease

## 2019-03-07 ENCOUNTER — Telehealth (INDEPENDENT_AMBULATORY_CARE_PROVIDER_SITE_OTHER): Payer: No Typology Code available for payment source | Admitting: Cardiovascular Disease

## 2019-03-07 ENCOUNTER — Encounter: Payer: Self-pay | Admitting: Cardiovascular Disease

## 2019-03-07 VITALS — BP 125/80 | HR 86 | Temp 99.1°F | Ht 66.0 in | Wt 176.0 lb

## 2019-03-07 DIAGNOSIS — F411 Generalized anxiety disorder: Secondary | ICD-10-CM

## 2019-03-07 DIAGNOSIS — R002 Palpitations: Secondary | ICD-10-CM

## 2019-03-07 DIAGNOSIS — I10 Essential (primary) hypertension: Secondary | ICD-10-CM

## 2019-03-07 DIAGNOSIS — R0789 Other chest pain: Secondary | ICD-10-CM | POA: Diagnosis not present

## 2019-03-07 MED ORDER — PROPRANOLOL HCL 20 MG PO TABS: 20.0000 mg | ORAL_TABLET | Freq: Two times a day (BID) | ORAL | 5 refills | Status: AC | PRN

## 2019-03-07 MED ORDER — HYDROCHLOROTHIAZIDE 12.5 MG PO TABS: ORAL_TABLET | ORAL | 3 refills | Status: AC

## 2019-03-07 NOTE — Telephone Encounter (Signed)
Patient is returning your call.  

## 2019-03-07 NOTE — Patient Instructions (Addendum)
Medication Instructions:  START HYDROCHLOROTHIAZIDE 12.5 MG 1/2 TABLET DAILY   START PROPANOLOL 20 MG TWICE A DAY AS NEEDED FOR PALPITATIONS   If you need a refill on your cardiac medications before your next appointment, please call your pharmacy.   Lab work: GET [facility] AT [place]   Testing/Procedures: NONE  Follow-Up: THE OFFICE WILL CALL YOU WITH FOLLOW UP APPOINTMENT IN 6-8 WEEKS

## 2019-03-08 NOTE — Telephone Encounter (Signed)
Left message to call back  

## 2019-04-01 ENCOUNTER — Telehealth: Payer: Self-pay | Admitting: Cardiovascular Disease

## 2019-04-02 ENCOUNTER — Telehealth (INDEPENDENT_AMBULATORY_CARE_PROVIDER_SITE_OTHER): Payer: No Typology Code available for payment source | Admitting: Cardiovascular Disease

## 2019-04-02 DIAGNOSIS — K219 Gastro-esophageal reflux disease without esophagitis: Secondary | ICD-10-CM

## 2019-04-02 DIAGNOSIS — R4589 Other symptoms and signs involving emotional state: Secondary | ICD-10-CM

## 2019-04-02 DIAGNOSIS — I10 Essential (primary) hypertension: Secondary | ICD-10-CM

## 2019-04-02 DIAGNOSIS — R002 Palpitations: Secondary | ICD-10-CM

## 2019-04-02 DIAGNOSIS — F418 Other specified anxiety disorders: Secondary | ICD-10-CM

## 2019-04-02 DIAGNOSIS — R0601 Orthopnea: Secondary | ICD-10-CM

## 2019-04-02 DIAGNOSIS — R000 Tachycardia, unspecified: Secondary | ICD-10-CM

## 2019-04-02 NOTE — Patient Instructions (Addendum)
Medication Instructions:  Your physician recommends that you continue on your current medications as directed. Please refer to the Current Medication list given to you today.  If you need a refill on your cardiac medications before your next appointment, please call your pharmacy.   Lab work: [facility] AT PRIMARY CARE WHEN ABLE   If you have labs (blood work) drawn today and your tests are completely normal, you will receive your results only by: Marland Kitchen MyChart Message (if you have MyChart) OR . A paper copy in the mail If you have any lab test that is abnormal or we need to change your treatment, we will call you to review the results.  Testing/Procedures: NONE  Follow-Up: Your physician recommends that you schedule a follow-up appointment in: 2 MONTHS 05/28/19 AT 8:00 AM WITH LUKE K PA   At [facility], you and your health needs are our priority.  As part of our continuing mission to provide you with exceptional heart care, we have created designated Provider Care Teams.  These Care Teams include your primary Cardiologist (physician) and Advanced Practice Providers (APPs -  Physician Assistants and Nurse Practitioners) who all work together to provide you with the care you need, when you need it. You will need a follow up appointment in 6 months.  Please call our office 2 months in advance to schedule this appointment.  You may see DR [facility] or one of the following Advanced Practice Providers on your designated Care Team:   Kerin Ransom, PA-C Roby Lofts, [place] . Sande Rives, PA-C  Any Other Special Instructions Will Be Listed Below (If Applicable). CONTACT YOUR SLEEP MEDICINE DOCTOR ABOUT GETTING REPEAT SLEEP STUDY

## 2019-04-02 NOTE — Progress Notes (Signed)
Virtual Visit via Video Note   This visit type was conducted due to national recommendations for restrictions regarding the COVID-19 Pandemic (e.g. social distancing) in an effort to limit this patient's exposure and mitigate transmission in our community.  Due to her co-morbid illnesses, this patient is at least at moderate risk for complications without adequate follow up.  This format is felt to be most appropriate for this patient at this time.  All issues noted in this document were discussed and addressed.  A limited physical exam was performed with this format.  Please refer to the patient's chart for her consent to telehealth for [facility].   Evaluation Performed:  Follow-up visit  Date:  04/02/2019   ID:  Stacy [name], DOB 1971/04/19, MRN [id number]  Patient Location: Home  Provider Location: Office  PCP:  Skeet Latch, MD  Cardiologist:  No primary care provider on file.  Electrophysiologist:  None   Chief Complaint:  tachycardia  History of Present Illness:    Stacy [name] is a 48 y.o. female who presents via audio/video conferencing for a telehealth visit today.    Stacy [name] is a 48 y.o. female with palpitations, hypertension, GERD, non-cardiac chest pain, shortness of breath, OSA on CPAP, anxiety and depression who presents for follow up.  Stacy [name] was seen in the ED 07/2015 with chest pain and shortness of breath.  Cardiac enzymes and EKG were unremarkable. LE ultrasound was negative for DVT one week prior to her ED visit.  She followed up in clinic 08/2016 and reported repeated episodes of chest pain, shortness of breath, palpitations and dizziness.  She reported having an ETT in 2015 that was negative for ischemia.  She was referred for ETT 10/15/15 that was negative for ischemia but showed poorly-controlled hypertension.  She also had a 7 day event monitor 08/2015 that showed no arrhythmias despite reports of heart racing,  skipped beats and chest pain.  She has had some episodes of documented sinus tachycardia and has been treated with metoprolol, though this was switched to propranolol due to fatigue.  She has been seen in the ED with chest pain quite frequently.  At times her blood pressure has been as high as 210 mmHg.  Her symptoms improved somewhat after she started taking clonazepam.  She was admitted 12/2015 with chest pain.  At the time one troponin was 0.07, though the follow up value was <0.03.  She underwent LHC 01/04/16 that showed normal coronaries.  It was discovered that some of her chest pain was attributable to GERD.  At her last appointment Stacy [name] blood pressure was poorly controlled so hydrochlorothiazide 6.25 mg was restarted.  She also had increased palpitations so she resumed propranolol.  Last night she woke up with her heart racing at 120 bpm.  She took propranolol at 11 pm and then woke up at 2am with her heart racing. She can't breathe when she lays down.  Her acid reflux has been under control.  Since she was seen she was diagnosed with OSA.  She uses a night guard but doesn't think that it is working.  She has not been getting much exercise but cleared out her garage and plans to start exercising soon.  She has no lower extremity edema but sleeps on 7 pillows so that she does not get short of breath.  Her blood pressure has been consistently in the 778 systolic.   The patient does have symptoms concerning for COVID-19 infection (  fever, chills, cough, or new shortness of breath).    Past Medical History:  Diagnosis Date  . Abnormal Pap smear    cryo, normal since  . Anxiety    past Hx  . Arrhythmia   . Chest pain 09/14/2009   no pulmonary embolus by chest CT  . Depression    past Hx  . Elevated BP   . Esophageal stricture   . Essential hypertension 09/28/2009   Qualifier: Diagnosis of  By: [name] FNP, [place]    . Fibroid   . Fibroids 09/24/2015  . GERD (gastroesophageal  reflux disease)    no meds  . Hypertension   . Palpitations   . Pneumonia   . Sickle cell trait ([place])   . Vaginal Pap smear, abnormal    Past Surgical History:  Procedure Laterality Date  . CARDIAC CATHETERIZATION N/A 01/04/2016   Procedure: Left Heart Cath and Coronary Angiography;  Surgeon: Belva Crome, MD;  Location: [place] [facility];  Service: Cardiovascular;  Laterality: N/A;  . CESAREAN SECTION  2004  . LAPAROSCOPY FOR ECTOPIC PREGNANCY  2001  . WISDOM TOOTH EXTRACTION  2012     No outpatient medications have been marked as taking for the 04/02/19 encounter (Appointment) with Skeet Latch, MD.     Allergies:   Hydromorphone; Morphine; and Diphenhydramine   Social History   Tobacco Use  . Smoking status: Never Smoker  . Smokeless tobacco: Never Used  Substance Use Topics  . Alcohol use: No    Alcohol/week: 0.0 standard drinks  . Drug use: No     Family Hx: The patient's family history includes Asthma in her daughter, maternal grandmother, and mother; Heart Problems in her mother; Hypertension in her mother and paternal uncle; Thyroid disease in her mother. There is no history of Colon cancer, Esophageal cancer, or Stomach cancer.  ROS:   Please see the history of present illness.     All other systems reviewed and are negative.   Prior CV studies:   The following studies were reviewed today:  ETT 10/15/15:   Blood pressure demonstrated a hypertensive response to exercise.  Upsloping ST segment depression ST segment depression of 2 mm was noted during stress in the II, III, aVF, V6, V4 and V5 leads.  The patient experienced no angina during the stress test.  Overall, the patient's exercise capacity was normal.  Duke Treadmill Score:low risk  Negative stress test without evidence of ischemia at given workload. Upsloping ST depression noted - may be due to LVH. Significant hypertension noted with rest BP 186/102 prior to exercise.  Echo 11/09/15:  LVEF 76%.  Mild thickening of the mitral valve.  Trace MR and TR.  RVSP 28 mmHg.  7 Day Event Monitor 09/16/15:  Quality: Fair. Baseline artifact. Predominant rhythm: sinus rhythm Pauses >2.5 seconds: 0 No PVCs or PACs were noted.  Patient did submit a symptom diary. She reported heart racing, skipped beats, and chest pain. At those times the underlying rhythm was sinus rhythm with rates from 70-93 bpm and one episode of sinus tachycardia, rate 113 bpm.  Echo 01/02/16: Study Conclusions  - Left ventricle: The cavity size was normal. Wall thickness was increased in a pattern of mild LVH. Systolic function was normal. The estimated ejection fraction was in the range of 60% to 65%. Wall motion was normal; there were no regional wall motion abnormalities. Doppler parameters are consistent with abnormal left ventricular relaxation (grade 1 diastolic dysfunction). - Mitral valve: There  was trivial regurgitation. - Right atrium: Central venous pressure (est): 3 mm Hg. - Tricuspid valve: There was trivial regurgitation. - Pulmonary arteries: PA peak pressure: 10 mm Hg (S). - Pericardium, extracardiac: A trivial pericardial effusion was identified posterior to the heart.  Impressions:  - Mild LVH with LVEF 60-65% and probable grade 1 diastolic dysfunction with normal LV filling pressure. Trivial mitral and tricuspid regurgitation. Possible trivial posterior pericardial effusion.  LHC 01/04/16:  Normal coronary arteries  Normal left ventricular function and hemodynamics  Chest pain is non-ischemic.  Labs/Other Tests and Data Reviewed:    EKG:  No ECG reviewed.  Recent Labs: No results found for requested labs within last 8760 hours.   Recent Lipid Panel Lab Results  Component Value Date/Time   CHOL 197 01/02/2016 02:49 AM   TRIG 41 01/02/2016 02:49 AM   HDL 57 01/02/2016 02:49 AM   CHOLHDL 3.5 01/02/2016 02:49 AM   LDLCALC 132 (H) 01/02/2016 02:49  AM    Wt Readings from Last 3 Encounters:  03/07/19 176 lb (79.8 kg)  11/30/18 180 lb 8 oz (81.9 kg)  07/17/17 182 lb (82.6 kg)     Objective:    BP 130/80   Pulse 89   Ht 5\' 6"  (1.676 m)   Wt 184 lb (83.5 kg)   SpO2 98%   BMI 29.70 kg/m  GENERAL: Well-appearing.  No acute distress. HEENT: Pupils equal round.  Oral mucosa unremarkable NECK:  No jugular venous distention, no visible thyromegaly EXT:  No edema, no cyanosis no clubbing SKIN:  No rashes no nodules NEURO:  Speech fluent.  Cranial nerves grossly intact.  Moves all 4 extremities freely PSYCH:  Cognitively intact, oriented to person place and time   ASSESSMENT & PLAN:    # Hypertension: Blood pressure has been much better controlled.  Continue HCTZ.  She will get a BMP checked with her PCP when they are doing lab draws.  # Chest/arm pain:  Resolved.  Stacy [name] is not have any coronary disease.  She had a left heart cath 12/2015 that was normal.  # Palpitations: 7 day event monitor did not reveal any abnormalities.    She has experienced a couple episodes of palpitations.  The event that occurred last night may have been triggered by poorly controlled sleep apnea.  She is using a mouthguard but does not think that this is effective.  She will follow-up with her sleep medicine doctor.  Continue to take propranolol as needed.    COVID-19 Education: The signs and symptoms of COVID-19 were discussed with the patient and how to seek care for testing (follow up with PCP or arrange E-visit).  The importance of social distancing was discussed today.  Time:   Today, I have spent 17 minutes with the patient with telehealth technology discussing the above problems.     Medication Adjustments/Labs and Tests Ordered: Current medicines are reviewed at length with the patient today.  Concerns regarding medicines are outlined above.   Tests Ordered: No orders of the defined types were placed in this encounter.   Medication Changes: No orders of the defined types were placed in this encounter.   Disposition:  Follow up with APP in 2 months.  Follow-up with me in 6 months.  Signed, Skeet Latch, MD  04/02/2019 7:35 AM    [place]

## 2019-05-23 ENCOUNTER — Telehealth: Payer: Self-pay | Admitting: Cardiology

## 2019-05-24 ENCOUNTER — Telehealth: Payer: Self-pay | Admitting: Cardiology

## 2019-05-24 NOTE — Telephone Encounter (Signed)
Called x3 for pre reg/LVM °

## 2019-05-27 NOTE — Telephone Encounter (Signed)
I called pt to confirm her appt for 05-28-19 with Kerin Ransom.

## 2019-05-28 ENCOUNTER — Encounter: Payer: Self-pay | Admitting: Cardiology

## 2019-05-28 ENCOUNTER — Telehealth: Payer: Self-pay

## 2019-05-28 ENCOUNTER — Telehealth (INDEPENDENT_AMBULATORY_CARE_PROVIDER_SITE_OTHER): Payer: No Typology Code available for payment source | Admitting: Cardiology

## 2019-05-28 VITALS — BP 130/80 | Temp 99.3°F

## 2019-05-28 DIAGNOSIS — IMO0001 Reserved for inherently not codable concepts without codable children: Secondary | ICD-10-CM

## 2019-05-28 DIAGNOSIS — Z0389 Encounter for observation for other suspected diseases and conditions ruled out: Secondary | ICD-10-CM

## 2019-05-28 DIAGNOSIS — I10 Essential (primary) hypertension: Secondary | ICD-10-CM

## 2019-05-28 DIAGNOSIS — R079 Chest pain, unspecified: Secondary | ICD-10-CM

## 2019-05-28 DIAGNOSIS — R002 Palpitations: Secondary | ICD-10-CM

## 2019-05-28 NOTE — Progress Notes (Signed)
Virtual Visit via Telephone Note   This visit type was conducted due to national recommendations for restrictions regarding the COVID-19 Pandemic (e.g. social distancing) in an effort to limit this patient's exposure and mitigate transmission in our community.  Due to her co-morbid illnesses, this patient is at least at moderate risk for complications without adequate follow up.  This format is felt to be most appropriate for this patient at this time.  The patient did not have access to video technology/had technical difficulties with video requiring transitioning to audio format only (telephone).  All issues noted in this document were discussed and addressed.  No physical exam could be performed with this format.  Please refer to the patient's chart for her  consent to telehealth for [facility].   Date:  05/28/2019   ID:  Stacy [name], DOB 1971/02/18, MRN [id number]  Patient Location: Home Provider Location: Office  PCP:  Skeet Latch, MD  Cardiologist:  Dr Oval Linsey Electrophysiologist:  None   Evaluation Performed:  Follow-Up Visit  Chief Complaint:  none  History of Present Illness:    Stacy [name] is a 48 y.o. female with a history of HTN and palpitations.  She has had chest pain in the past and cath done previously showed normal coronaries.  She just saw Dr Oval Linsey 04/02/2019 and noted some palpitations.  She was encouraged to take her Propanolol PRN and continue her HCTZ for HTN.  She was contacted to day for follow up and was doing well.  She will f/u with [place] PRN.   The patient does not have symptoms concerning for COVID-19 infection (fever, chills, cough, or new shortness of breath).    Past Medical History:  Diagnosis Date  . Abnormal Pap smear    cryo, normal since  . Anxiety    past Hx  . Arrhythmia   . Chest pain 09/14/2009   no pulmonary embolus by chest CT  . Depression    past Hx  . Elevated BP   . Esophageal stricture   . Essential  hypertension 09/28/2009   Qualifier: Diagnosis of  By: [name] FNP, [place]    . Fibroid   . Fibroids 09/24/2015  . GERD (gastroesophageal reflux disease)    no meds  . Hypertension   . Palpitations   . Pneumonia   . Sickle cell trait ([place])   . Vaginal Pap smear, abnormal    Past Surgical History:  Procedure Laterality Date  . CARDIAC CATHETERIZATION N/A 01/04/2016   Procedure: Left Heart Cath and Coronary Angiography;  Surgeon: Belva Crome, MD;  Location: [place] [facility];  Service: Cardiovascular;  Laterality: N/A;  . CESAREAN SECTION  2004  . LAPAROSCOPY FOR ECTOPIC PREGNANCY  2001  . WISDOM TOOTH EXTRACTION  2012     No outpatient medications have been marked as taking for the 05/28/19 encounter (Telemedicine) with Erlene Quan, PA-C.     Allergies:   Hydromorphone, Morphine, and Diphenhydramine   Social History   Tobacco Use  . Smoking status: Never Smoker  . Smokeless tobacco: Never Used  Substance Use Topics  . Alcohol use: No    Alcohol/week: 0.0 standard drinks  . Drug use: No     Family Hx: The patient's family history includes Asthma in her daughter, maternal grandmother, and mother; Heart Problems in her mother; Hypertension in her mother and paternal uncle; Thyroid disease in her mother. There is no history of Colon cancer, Esophageal cancer, or Stomach cancer.  ROS:   Please see the history of present illness.    All other systems reviewed and are negative.   Prior CV studies:   The following studies were reviewed today:    Labs/Other Tests and Data Reviewed:    EKG:  No ECG reviewed.  Recent Labs: No results found for requested labs within last 8760 hours.   Recent Lipid Panel Lab Results  Component Value Date/Time   CHOL 197 01/02/2016 02:49 AM   TRIG 41 01/02/2016 02:49 AM   HDL 57 01/02/2016 02:49 AM   CHOLHDL 3.5 01/02/2016 02:49 AM   LDLCALC 132 (H) 01/02/2016 02:49 AM    Wt Readings from Last 3 Encounters:  04/02/19 184  lb (83.5 kg)  03/07/19 176 lb (79.8 kg)  11/30/18 180 lb 8 oz (81.9 kg)     Objective:    Vital Signs:  BP 130/80   Temp 99.3 F (37.4 C)    VITAL SIGNS:  reviewed  ASSESSMENT & PLAN:    HTN- Controlled  Palpitations- Unremarkable Holter 2016- propanolol PRN  Chest pain- Normal coronaries feb 2017  COVID-19 Education: The signs and symptoms of COVID-19 were discussed with the patient and how to seek care for testing (follow up with PCP or arrange E-visit).  The importance of social distancing was discussed today.  Time:   Today, I have spent 5 minutes with the patient with telehealth technology discussing the above problems.     Medication Adjustments/Labs and Tests Ordered: Current medicines are reviewed at length with the patient today.  Concerns regarding medicines are outlined above.   Tests Ordered: No orders of the defined types were placed in this encounter.   Medication Changes: No orders of the defined types were placed in this encounter.   Follow Up:  Virtual Visit or In Person prn  Signed, Kerin Ransom, PA-C  05/28/2019 1:55 PM    [place]

## 2019-05-28 NOTE — Patient Instructions (Signed)
Medication Instructions:  Your physician recommends that you continue on your current medications as directed. Please refer to the Current Medication list given to you today. If you need a refill on your cardiac medications before your next appointment, please call your pharmacy.   Lab work: None  If you have labs (blood work) drawn today and your tests are completely normal, you will receive your results only by: Marland Kitchen MyChart Message (if you have MyChart) OR . A paper copy in the mail If you have any lab test that is abnormal or we need to change your treatment, we will call you to review the results.  Testing/Procedures: None   Follow-Up: At [facility], you and your health needs are our priority.  As part of our continuing mission to provide you with exceptional heart care, we have created designated Provider Care Teams.  These Care Teams include your primary Cardiologist (physician) and Advanced Practice Providers (APPs -  Physician Assistants and Nurse Practitioners) who all work together to provide you with the care you need, when you need it. Marland Kitchen LUKE RECOMMENDS YOU FOLLOW UP WITH DR Summer Shade ON A AS NEEDED BASIS.  Any Other Special Instructions Will Be Listed Below (If Applicable).

## 2019-05-28 NOTE — Telephone Encounter (Signed)
Contacted patient to discuss AVS Instructions. Gave patient Stacy [name]'s recommendations from today's virtual office visit. Patient voiced understanding and AVS mailed.    

## 2019-05-28 NOTE — Telephone Encounter (Signed)
Open n error °

## 2019-05-28 NOTE — Telephone Encounter (Signed)
Called patient to get her worked up for her virtual visit. Left message advising her to contact the office.

## 2019-05-29 NOTE — Telephone Encounter (Signed)
Open n error °

## 2020-04-06 ENCOUNTER — Other Ambulatory Visit: Payer: Self-pay | Admitting: Obstetrics and Gynecology

## 2020-04-06 DIAGNOSIS — Z1231 Encounter for screening mammogram for malignant neoplasm of breast: Secondary | ICD-10-CM

## 2020-04-17 ENCOUNTER — Ambulatory Visit: Payer: Self-pay

## 2020-04-17 ENCOUNTER — Ambulatory Visit
Admission: RE | Admit: 2020-04-17 | Discharge: 2020-04-17 | Disposition: A | Discharge status: Home / Self Care | Payer: No Typology Code available for payment source | Source: Ambulatory Visit | Attending: Obstetrics and Gynecology | Admitting: Obstetrics and Gynecology

## 2020-04-17 ENCOUNTER — Other Ambulatory Visit: Payer: Self-pay

## 2020-04-17 DIAGNOSIS — Z1231 Encounter for screening mammogram for malignant neoplasm of breast: Secondary | ICD-10-CM

## 2021-06-22 ENCOUNTER — Other Ambulatory Visit: Payer: Self-pay | Admitting: Family Medicine

## 2021-06-22 DIAGNOSIS — Z1231 Encounter for screening mammogram for malignant neoplasm of breast: Secondary | ICD-10-CM

## 2021-08-24 ENCOUNTER — Ambulatory Visit: Payer: No Typology Code available for payment source

## 2021-09-20 ENCOUNTER — Ambulatory Visit: Payer: Self-pay

## 2021-09-22 ENCOUNTER — Other Ambulatory Visit: Payer: Self-pay

## 2021-09-22 ENCOUNTER — Ambulatory Visit
Admission: RE | Admit: 2021-09-22 | Discharge: 2021-09-22 | Disposition: A | Discharge status: Home / Self Care | Payer: No Typology Code available for payment source | Source: Ambulatory Visit | Attending: *Deleted | Admitting: *Deleted

## 2021-09-22 DIAGNOSIS — Z1231 Encounter for screening mammogram for malignant neoplasm of breast: Secondary | ICD-10-CM

## 2021-09-27 ENCOUNTER — Other Ambulatory Visit: Payer: Self-pay | Admitting: *Deleted

## 2021-09-27 DIAGNOSIS — R928 Other abnormal and inconclusive findings on diagnostic imaging of breast: Secondary | ICD-10-CM

## 2021-10-07 ENCOUNTER — Other Ambulatory Visit: Payer: Self-pay | Admitting: Cardiovascular Disease

## 2021-10-07 DIAGNOSIS — R928 Other abnormal and inconclusive findings on diagnostic imaging of breast: Secondary | ICD-10-CM

## 2021-10-13 ENCOUNTER — Other Ambulatory Visit: Payer: No Typology Code available for payment source

## 2021-10-15 ENCOUNTER — Ambulatory Visit
Admission: RE | Admit: 2021-10-15 | Discharge: 2021-10-15 | Disposition: A | Discharge status: Home / Self Care | Payer: 59 | Source: Ambulatory Visit | Attending: Cardiovascular Disease | Admitting: Cardiovascular Disease

## 2021-10-15 ENCOUNTER — Ambulatory Visit: Admission: RE | Admit: 2021-10-15 | Payer: No Typology Code available for payment source | Source: Ambulatory Visit

## 2021-10-15 ENCOUNTER — Other Ambulatory Visit: Payer: Self-pay

## 2021-10-15 DIAGNOSIS — R928 Other abnormal and inconclusive findings on diagnostic imaging of breast: Secondary | ICD-10-CM

## 2021-10-19 ENCOUNTER — Ambulatory Visit: Payer: Self-pay

## 2022-08-10 ENCOUNTER — Emergency Department (HOSPITAL_COMMUNITY): Payer: PRIVATE HEALTH INSURANCE

## 2022-08-10 ENCOUNTER — Other Ambulatory Visit: Payer: Self-pay

## 2022-08-10 ENCOUNTER — Emergency Department (HOSPITAL_COMMUNITY)
Admission: EM | Admit: 2022-08-10 | Discharge: 2022-08-10 | Disposition: A | Discharge status: Home / Self Care | Payer: PRIVATE HEALTH INSURANCE | Attending: Emergency Medicine | Admitting: Emergency Medicine

## 2022-08-10 ENCOUNTER — Encounter (HOSPITAL_COMMUNITY): Payer: Self-pay

## 2022-08-10 DIAGNOSIS — S29012A Strain of muscle and tendon of back wall of thorax, initial encounter: Secondary | ICD-10-CM | POA: Diagnosis not present

## 2022-08-10 DIAGNOSIS — I10 Essential (primary) hypertension: Secondary | ICD-10-CM | POA: Insufficient documentation

## 2022-08-10 DIAGNOSIS — M25511 Pain in right shoulder: Secondary | ICD-10-CM | POA: Insufficient documentation

## 2022-08-10 DIAGNOSIS — S24109A Unspecified injury at unspecified level of thoracic spinal cord, initial encounter: Secondary | ICD-10-CM | POA: Diagnosis present

## 2022-08-10 DIAGNOSIS — Y92410 Unspecified street and highway as the place of occurrence of the external cause: Secondary | ICD-10-CM | POA: Diagnosis not present

## 2022-08-10 DIAGNOSIS — Z79899 Other long term (current) drug therapy: Secondary | ICD-10-CM | POA: Insufficient documentation

## 2022-08-10 LAB — I-STAT BETA HCG BLOOD, ED (MC, WL, AP ONLY): I-stat hCG, quantitative: <5 m[IU]/mL (ref <5)

## 2022-08-10 LAB — CBC
HCT: 34.6 % — ABNORMAL LOW (ref 36.0–46.0)
Hemoglobin: 11.7 g/dL — ABNORMAL LOW (ref 12.0–15.0)
MCH: 26.5 pg (ref 26.0–34.0)
MCHC: 33.8 g/dL (ref 30.0–36.0)
MCV: 78.3 fL — ABNORMAL LOW (ref 80.0–100.0)
Platelets: 368 10*3/uL (ref 150–400)
RBC: 4.42 MIL/uL (ref 3.87–5.11)
RDW: 15.1 % (ref 11.5–15.5)
WBC: 5.3 10*3/uL (ref 4.0–10.5)
nRBC: 0 % (ref 0.0–0.2)

## 2022-08-10 LAB — BASIC METABOLIC PANEL
Anion gap: 9 (ref 5–15)
BUN: 8 mg/dL (ref 6–20)
CO2: 25 mmol/L (ref 22–32)
Calcium: 9 mg/dL (ref 8.9–10.3)
Chloride: 104 mmol/L (ref 98–111)
Creatinine, Ser: 0.68 mg/dL (ref 0.44–1.00)
GFR, Estimated: >60 mL/min (ref >60)
Glucose, Bld: 89 mg/dL (ref 70–99)
Potassium: 3.6 mmol/L (ref 3.5–5.1)
Sodium: 138 mmol/L (ref 135–145)

## 2022-08-10 MED ORDER — LIDOCAINE 5 % EX PTCH
1.0000 | MEDICATED_PATCH | CUTANEOUS | Status: DC
  Administered 2022-08-10: 1 via TRANSDERMAL
  Filled 2022-08-10: qty 1
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
## 2022-09-27 ENCOUNTER — Other Ambulatory Visit: Payer: Self-pay | Admitting: Obstetrics and Gynecology

## 2022-09-27 DIAGNOSIS — Z1231 Encounter for screening mammogram for malignant neoplasm of breast: Secondary | ICD-10-CM

## 2022-11-23 ENCOUNTER — Ambulatory Visit
Admission: RE | Admit: 2022-11-23 | Discharge: 2022-11-23 | Disposition: A | Discharge status: Home / Self Care | Payer: PRIVATE HEALTH INSURANCE | Source: Ambulatory Visit | Attending: Obstetrics and Gynecology | Admitting: Obstetrics and Gynecology

## 2022-11-23 DIAGNOSIS — Z1231 Encounter for screening mammogram for malignant neoplasm of breast: Secondary | ICD-10-CM

## 2023-11-15 ENCOUNTER — Other Ambulatory Visit: Payer: Self-pay | Admitting: Obstetrics & Gynecology

## 2023-11-15 DIAGNOSIS — Z1231 Encounter for screening mammogram for malignant neoplasm of breast: Secondary | ICD-10-CM

## 2023-12-07 ENCOUNTER — Ambulatory Visit
Admission: RE | Admit: 2023-12-07 | Discharge: 2023-12-07 | Disposition: A | Discharge status: Home / Self Care | Payer: 59 | Source: Ambulatory Visit | Attending: Obstetrics & Gynecology | Admitting: Obstetrics & Gynecology

## 2023-12-07 DIAGNOSIS — Z1231 Encounter for screening mammogram for malignant neoplasm of breast: Secondary | ICD-10-CM

## 2024-12-03 DIAGNOSIS — Z1231 Encounter for screening mammogram for malignant neoplasm of breast: Secondary | ICD-10-CM

## 2024-12-18 ENCOUNTER — Ambulatory Visit: Payer: PRIVATE HEALTH INSURANCE

## 2024-12-19 ENCOUNTER — Ambulatory Visit
Admission: RE | Admit: 2024-12-19 | Discharge: 2024-12-19 | Disposition: A | Discharge status: Home / Self Care | Payer: PRIVATE HEALTH INSURANCE | Source: Ambulatory Visit | Attending: Obstetrics & Gynecology | Admitting: Obstetrics & Gynecology

## 2024-12-19 DIAGNOSIS — Z1231 Encounter for screening mammogram for malignant neoplasm of breast: Secondary | ICD-10-CM

## 2024-12-23 ENCOUNTER — Ambulatory Visit: Payer: PRIVATE HEALTH INSURANCE
